# Patient Record
Sex: Female | Born: 1940 | Race: White | Hispanic: No | Smoking: Former smoker
Health system: Southern US, Community
[De-identification: ages and names within clinical notes are randomized; demographics above are authoritative.]

## PROBLEM LIST (undated history)

## (undated) DIAGNOSIS — I509 Heart failure, unspecified: Secondary | ICD-10-CM

## (undated) DIAGNOSIS — I739 Peripheral vascular disease, unspecified: Secondary | ICD-10-CM

## (undated) DIAGNOSIS — I10 Essential (primary) hypertension: Secondary | ICD-10-CM

## (undated) DIAGNOSIS — I219 Acute myocardial infarction, unspecified: Secondary | ICD-10-CM

## (undated) DIAGNOSIS — E785 Hyperlipidemia, unspecified: Secondary | ICD-10-CM

## (undated) DIAGNOSIS — R0600 Dyspnea, unspecified: Secondary | ICD-10-CM

## (undated) DIAGNOSIS — Z923 Personal history of irradiation: Secondary | ICD-10-CM

## (undated) DIAGNOSIS — I2510 Atherosclerotic heart disease of native coronary artery without angina pectoris: Secondary | ICD-10-CM

## (undated) DIAGNOSIS — M1990 Unspecified osteoarthritis, unspecified site: Secondary | ICD-10-CM

## (undated) DIAGNOSIS — C50919 Malignant neoplasm of unspecified site of unspecified female breast: Secondary | ICD-10-CM

## (undated) HISTORY — PX: OTHER SURGICAL HISTORY: SHX169

## (undated) HISTORY — PX: CHOLECYSTECTOMY: SHX55

## (undated) HISTORY — PX: DILATION AND CURETTAGE OF UTERUS: SHX78

## (undated) HISTORY — PX: CATARACT EXTRACTION W/ INTRAOCULAR LENS IMPLANT & ANTERIOR VITRECTOMY, BILATERAL: SHX1310

## (undated) HISTORY — DX: Heart failure, unspecified: I50.9

## (undated) HISTORY — DX: Hyperlipidemia, unspecified: E78.5

## (undated) HISTORY — PX: APPENDECTOMY: SHX54

## (undated) HISTORY — PX: BREAST LUMPECTOMY: SHX2

## (undated) HISTORY — PX: EYE SURGERY: SHX253

## (undated) HISTORY — PX: ABDOMINAL HYSTERECTOMY: SHX81

---

## 2004-10-10 ENCOUNTER — Ambulatory Visit: Payer: Self-pay | Admitting: Internal Medicine

## 2005-05-14 ENCOUNTER — Ambulatory Visit: Payer: Self-pay | Admitting: Internal Medicine

## 2005-06-14 ENCOUNTER — Ambulatory Visit: Payer: Self-pay | Admitting: Internal Medicine

## 2005-10-11 ENCOUNTER — Ambulatory Visit: Payer: Self-pay | Admitting: Internal Medicine

## 2006-10-13 ENCOUNTER — Ambulatory Visit: Payer: Self-pay | Admitting: Internal Medicine

## 2006-12-01 ENCOUNTER — Ambulatory Visit: Payer: Self-pay | Admitting: Gastroenterology

## 2007-11-24 ENCOUNTER — Ambulatory Visit: Payer: Self-pay | Admitting: Internal Medicine

## 2008-11-24 ENCOUNTER — Ambulatory Visit: Payer: Self-pay | Admitting: Internal Medicine

## 2009-01-14 DIAGNOSIS — C50919 Malignant neoplasm of unspecified site of unspecified female breast: Secondary | ICD-10-CM

## 2009-01-14 HISTORY — DX: Malignant neoplasm of unspecified site of unspecified female breast: C50.919

## 2009-11-27 ENCOUNTER — Ambulatory Visit: Payer: Self-pay | Admitting: Internal Medicine

## 2009-12-05 ENCOUNTER — Ambulatory Visit: Payer: Self-pay | Admitting: Internal Medicine

## 2009-12-21 ENCOUNTER — Ambulatory Visit: Payer: Self-pay | Admitting: Surgery

## 2009-12-21 HISTORY — PX: BREAST BIOPSY: SHX20

## 2009-12-29 LAB — PATHOLOGY REPORT

## 2010-01-11 ENCOUNTER — Ambulatory Visit: Payer: Self-pay | Admitting: Surgery

## 2010-01-17 ENCOUNTER — Ambulatory Visit: Payer: Self-pay | Admitting: Surgery

## 2010-01-17 HISTORY — PX: BREAST EXCISIONAL BIOPSY: SUR124

## 2010-01-24 ENCOUNTER — Ambulatory Visit: Payer: Self-pay | Admitting: Oncology

## 2010-01-24 LAB — PATHOLOGY REPORT

## 2010-02-14 ENCOUNTER — Ambulatory Visit: Payer: Self-pay | Admitting: Oncology

## 2010-03-15 ENCOUNTER — Ambulatory Visit: Payer: Self-pay | Admitting: Oncology

## 2010-04-15 ENCOUNTER — Ambulatory Visit: Payer: Self-pay | Admitting: Oncology

## 2010-05-15 ENCOUNTER — Ambulatory Visit: Payer: Self-pay | Admitting: Oncology

## 2010-06-15 ENCOUNTER — Ambulatory Visit: Payer: Self-pay | Admitting: Oncology

## 2010-07-23 ENCOUNTER — Ambulatory Visit: Payer: Self-pay | Admitting: Oncology

## 2010-08-15 ENCOUNTER — Ambulatory Visit: Payer: Self-pay | Admitting: Oncology

## 2010-10-24 ENCOUNTER — Ambulatory Visit: Payer: Self-pay | Admitting: Oncology

## 2010-11-15 ENCOUNTER — Ambulatory Visit: Payer: Self-pay | Admitting: Oncology

## 2011-01-15 ENCOUNTER — Ambulatory Visit: Payer: Self-pay | Admitting: Oncology

## 2011-01-28 ENCOUNTER — Ambulatory Visit: Payer: Self-pay | Admitting: Oncology

## 2011-01-30 ENCOUNTER — Ambulatory Visit: Payer: Self-pay | Admitting: Oncology

## 2011-01-31 ENCOUNTER — Ambulatory Visit: Payer: Self-pay | Admitting: Oncology

## 2011-02-15 ENCOUNTER — Ambulatory Visit: Payer: Self-pay | Admitting: Oncology

## 2011-07-31 ENCOUNTER — Ambulatory Visit: Payer: Self-pay | Admitting: Oncology

## 2011-08-01 ENCOUNTER — Ambulatory Visit: Payer: Self-pay | Admitting: Oncology

## 2011-08-15 ENCOUNTER — Ambulatory Visit: Payer: Self-pay | Admitting: Oncology

## 2011-10-24 ENCOUNTER — Ambulatory Visit: Payer: Self-pay | Admitting: Oncology

## 2011-11-07 LAB — CBC CANCER CENTER
Basophil %: 0.6 %
Eosinophil #: 0.1 x10 3/mm (ref 0.0–0.7)
Eosinophil %: 3.4 %
HCT: 35.4 % (ref 35.0–47.0)
HGB: 12 g/dL (ref 12.0–16.0)
MCH: 36.6 pg — ABNORMAL HIGH (ref 26.0–34.0)
MCHC: 33.9 g/dL (ref 32.0–36.0)
MCV: 108 fL — ABNORMAL HIGH (ref 80–100)
Monocyte #: 0.3 x10 3/mm (ref 0.2–0.9)
Neutrophil #: 1.7 x10 3/mm (ref 1.4–6.5)
Neutrophil %: 43.6 %
RBC: 3.28 10*6/uL — ABNORMAL LOW (ref 3.80–5.20)

## 2011-11-15 ENCOUNTER — Ambulatory Visit: Payer: Self-pay | Admitting: Oncology

## 2012-01-15 ENCOUNTER — Ambulatory Visit: Payer: Self-pay | Admitting: Oncology

## 2012-02-03 ENCOUNTER — Ambulatory Visit: Payer: Self-pay | Admitting: Oncology

## 2012-02-06 LAB — CBC CANCER CENTER
Basophil #: 0 x10 3/mm (ref 0.0–0.1)
Eosinophil #: 0.1 x10 3/mm (ref 0.0–0.7)
Eosinophil %: 3.1 %
HCT: 33 % — ABNORMAL LOW (ref 35.0–47.0)
HGB: 11.9 g/dL — ABNORMAL LOW (ref 12.0–16.0)
MCH: 37.4 pg — ABNORMAL HIGH (ref 26.0–34.0)
MCHC: 35.9 g/dL (ref 32.0–36.0)
Neutrophil #: 2.2 x10 3/mm (ref 1.4–6.5)
Platelet: 96 x10 3/mm — ABNORMAL LOW (ref 150–440)
RBC: 3.17 10*6/uL — ABNORMAL LOW (ref 3.80–5.20)
WBC: 4.8 x10 3/mm (ref 3.6–11.0)

## 2012-02-15 ENCOUNTER — Ambulatory Visit: Payer: Self-pay | Admitting: Oncology

## 2012-04-14 ENCOUNTER — Ambulatory Visit: Payer: Self-pay | Admitting: Oncology

## 2012-05-07 LAB — CBC CANCER CENTER
Basophil #: 0 x10 3/mm (ref 0.0–0.1)
Basophil %: 0.7 %
HGB: 12 g/dL (ref 12.0–16.0)
Lymphocyte #: 2.2 x10 3/mm (ref 1.0–3.6)
Lymphocyte %: 41.2 %
MCH: 37.3 pg — ABNORMAL HIGH (ref 26.0–34.0)
MCV: 105 fL — ABNORMAL HIGH (ref 80–100)
Monocyte #: 0.7 x10 3/mm (ref 0.2–0.9)
Monocyte %: 12.8 %
Neutrophil #: 2.2 x10 3/mm (ref 1.4–6.5)
Neutrophil %: 42.5 %
Platelet: 110 x10 3/mm — ABNORMAL LOW (ref 150–440)
RBC: 3.22 10*6/uL — ABNORMAL LOW (ref 3.80–5.20)
RDW: 13 % (ref 11.5–14.5)
WBC: 5.2 x10 3/mm (ref 3.6–11.0)

## 2012-05-14 ENCOUNTER — Ambulatory Visit: Payer: Self-pay | Admitting: Oncology

## 2012-08-04 ENCOUNTER — Ambulatory Visit: Payer: Self-pay | Admitting: Oncology

## 2012-08-19 ENCOUNTER — Ambulatory Visit: Payer: Self-pay | Admitting: Oncology

## 2012-09-17 ENCOUNTER — Ambulatory Visit: Payer: Self-pay | Admitting: Oncology

## 2012-09-17 LAB — CBC CANCER CENTER
Basophil %: 0.5 %
HCT: 34.8 % — ABNORMAL LOW (ref 35.0–47.0)
Lymphocyte #: 1.6 x10 3/mm (ref 1.0–3.6)
MCH: 36.9 pg — ABNORMAL HIGH (ref 26.0–34.0)
MCHC: 35.2 g/dL (ref 32.0–36.0)
MCV: 105 fL — ABNORMAL HIGH (ref 80–100)
Monocyte #: 0.4 x10 3/mm (ref 0.2–0.9)
Neutrophil %: 40.8 %
RBC: 3.32 10*6/uL — ABNORMAL LOW (ref 3.80–5.20)
RDW: 13.1 % (ref 11.5–14.5)

## 2012-09-22 ENCOUNTER — Ambulatory Visit: Payer: Self-pay | Admitting: Surgery

## 2012-10-14 ENCOUNTER — Ambulatory Visit: Payer: Self-pay | Admitting: Oncology

## 2012-11-14 ENCOUNTER — Ambulatory Visit: Payer: Self-pay | Admitting: Oncology

## 2013-03-18 ENCOUNTER — Ambulatory Visit: Payer: Self-pay | Admitting: Oncology

## 2013-03-18 LAB — CBC CANCER CENTER
BASOS ABS: 0 x10 3/mm (ref 0.0–0.1)
BASOS PCT: 0.7 %
Eosinophil #: 0.1 x10 3/mm (ref 0.0–0.7)
Eosinophil %: 3 %
HCT: 35.3 % (ref 35.0–47.0)
HGB: 12 g/dL (ref 12.0–16.0)
Lymphocyte #: 1.7 x10 3/mm (ref 1.0–3.6)
Lymphocyte %: 40.9 %
MCH: 35.4 pg — ABNORMAL HIGH (ref 26.0–34.0)
MCHC: 34.1 g/dL (ref 32.0–36.0)
MCV: 104 fL — ABNORMAL HIGH (ref 80–100)
MONOS PCT: 11.5 %
Monocyte #: 0.5 x10 3/mm (ref 0.2–0.9)
NEUTROS PCT: 43.9 %
Neutrophil #: 1.8 x10 3/mm (ref 1.4–6.5)
PLATELETS: 97 x10 3/mm — AB (ref 150–440)
RBC: 3.4 10*6/uL — ABNORMAL LOW (ref 3.80–5.20)
RDW: 13 % (ref 11.5–14.5)
WBC: 4.1 x10 3/mm (ref 3.6–11.0)

## 2013-04-14 ENCOUNTER — Ambulatory Visit: Payer: Self-pay | Admitting: Oncology

## 2013-09-23 ENCOUNTER — Ambulatory Visit: Payer: Self-pay | Admitting: Oncology

## 2013-09-30 ENCOUNTER — Ambulatory Visit: Payer: Self-pay | Admitting: Oncology

## 2013-10-01 LAB — CBC CANCER CENTER
BASOS ABS: 0 x10 3/mm (ref 0.0–0.1)
Basophil %: 0.4 %
EOS ABS: 0 x10 3/mm (ref 0.0–0.7)
Eosinophil %: 0.4 %
HCT: 36.8 % (ref 35.0–47.0)
HGB: 12.7 g/dL (ref 12.0–16.0)
LYMPHS ABS: 2.8 x10 3/mm (ref 1.0–3.6)
Lymphocyte %: 34.2 %
MCH: 36.7 pg — AB (ref 26.0–34.0)
MCHC: 34.5 g/dL (ref 32.0–36.0)
MCV: 106 fL — ABNORMAL HIGH (ref 80–100)
MONO ABS: 0.6 x10 3/mm (ref 0.2–0.9)
MONOS PCT: 7.7 %
NEUTROS ABS: 4.8 x10 3/mm (ref 1.4–6.5)
NEUTROS PCT: 57.3 %
Platelet: 142 x10 3/mm — ABNORMAL LOW (ref 150–440)
RBC: 3.46 10*6/uL — AB (ref 3.80–5.20)
RDW: 13.3 % (ref 11.5–14.5)
WBC: 8.3 x10 3/mm (ref 3.6–11.0)

## 2013-10-14 ENCOUNTER — Ambulatory Visit: Payer: Self-pay | Admitting: Oncology

## 2013-10-27 ENCOUNTER — Ambulatory Visit: Payer: Self-pay | Admitting: Oncology

## 2013-11-07 ENCOUNTER — Emergency Department: Payer: Self-pay | Admitting: Emergency Medicine

## 2013-11-14 ENCOUNTER — Ambulatory Visit: Payer: Self-pay | Admitting: Oncology

## 2013-12-03 ENCOUNTER — Emergency Department: Payer: Self-pay | Admitting: Emergency Medicine

## 2014-04-11 ENCOUNTER — Ambulatory Visit
Admit: 2014-04-11 | Disposition: A | Discharge status: Home / Self Care | Payer: Self-pay | Attending: Oncology | Admitting: Oncology

## 2014-04-11 LAB — CBC CANCER CENTER
BASOS ABS: 0 x10 3/mm (ref 0.0–0.1)
Basophil %: 0.5 %
EOS ABS: 0.1 x10 3/mm (ref 0.0–0.7)
Eosinophil %: 2.5 %
HCT: 34 % — ABNORMAL LOW (ref 35.0–47.0)
HGB: 11.9 g/dL — ABNORMAL LOW (ref 12.0–16.0)
LYMPHS PCT: 36.1 %
Lymphocyte #: 1.6 x10 3/mm (ref 1.0–3.6)
MCH: 35.7 pg — ABNORMAL HIGH (ref 26.0–34.0)
MCHC: 34.9 g/dL (ref 32.0–36.0)
MCV: 102 fL — ABNORMAL HIGH (ref 80–100)
Monocyte #: 0.5 x10 3/mm (ref 0.2–0.9)
Monocyte %: 12 %
Neutrophil #: 2.2 x10 3/mm (ref 1.4–6.5)
Neutrophil %: 48.9 %
PLATELETS: 98 x10 3/mm — AB (ref 150–440)
RBC: 3.32 10*6/uL — ABNORMAL LOW (ref 3.80–5.20)
RDW: 12.5 % (ref 11.5–14.5)
WBC: 4.5 x10 3/mm (ref 3.6–11.0)

## 2014-04-15 ENCOUNTER — Ambulatory Visit
Admit: 2014-04-15 | Disposition: A | Discharge status: Home / Self Care | Payer: Self-pay | Attending: Oncology | Admitting: Oncology

## 2014-04-29 ENCOUNTER — Other Ambulatory Visit: Payer: Self-pay | Admitting: Oncology

## 2014-04-29 DIAGNOSIS — Z853 Personal history of malignant neoplasm of breast: Secondary | ICD-10-CM

## 2014-09-26 ENCOUNTER — Other Ambulatory Visit: Payer: Self-pay

## 2014-10-05 ENCOUNTER — Other Ambulatory Visit: Payer: Self-pay | Admitting: Oncology

## 2014-10-05 ENCOUNTER — Inpatient Hospital Stay: Payer: Medicare Other

## 2014-10-05 ENCOUNTER — Inpatient Hospital Stay: Payer: Medicare Other | Admitting: Oncology

## 2014-10-05 DIAGNOSIS — D0510 Intraductal carcinoma in situ of unspecified breast: Secondary | ICD-10-CM

## 2014-10-13 ENCOUNTER — Other Ambulatory Visit: Payer: Self-pay

## 2014-10-13 ENCOUNTER — Ambulatory Visit: Payer: Self-pay | Admitting: Oncology

## 2014-10-24 ENCOUNTER — Inpatient Hospital Stay: Payer: Medicare Other

## 2014-10-24 ENCOUNTER — Inpatient Hospital Stay: Payer: Medicare Other | Admitting: Oncology

## 2014-11-02 ENCOUNTER — Encounter: Payer: Self-pay | Admitting: Radiation Oncology

## 2014-11-02 ENCOUNTER — Other Ambulatory Visit: Payer: Self-pay | Admitting: Oncology

## 2014-11-02 ENCOUNTER — Ambulatory Visit
Admission: RE | Admit: 2014-11-02 | Discharge: 2014-11-02 | Disposition: A | Discharge status: Home / Self Care | Payer: Medicare Other | Source: Ambulatory Visit | Attending: Radiation Oncology | Admitting: Radiation Oncology

## 2014-11-02 VITALS — BP 168/72 | HR 69 | Temp 97.5°F | Resp 18 | Ht 60.0 in | Wt 149.5 lb

## 2014-11-02 DIAGNOSIS — C50912 Malignant neoplasm of unspecified site of left female breast: Secondary | ICD-10-CM

## 2014-11-02 DIAGNOSIS — D0510 Intraductal carcinoma in situ of unspecified breast: Secondary | ICD-10-CM

## 2014-11-02 HISTORY — DX: Malignant neoplasm of unspecified site of unspecified female breast: C50.919

## 2014-11-02 HISTORY — DX: Essential (primary) hypertension: I10

## 2014-11-02 NOTE — Progress Notes (Signed)
Radiation Oncology Follow up Note  Name: [name]   Date:   11/02/2014 MRN:  [id number] DOB: July 08, 1940    This 74 y.o. 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  REFERRING PROVIDER: No ref. 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 She is seen today in routine follow-up is doing well. She specifically denies breast tenderness cough or bone pain. Currently on tamoxifen tolerating that well without side effect. She missed her most recent mammogram that has been rescheduled..  COMPLICATIONS OF TREATMENT: none  FOLLOW UP COMPLIANCE: keeps appointments   PHYSICAL EXAM:  BP 168/72 mmHg  Pulse 69  Temp(Src) 97.5 F (36.4 C)  Resp 18  Ht 5' (1.524 m)  Wt 149 lb 7.6 oz (67.8 kg)  BMI 29.19 kg/m2 Lungs are clear to A&P cardiac examination essentially unremarkable with regular rate and rhythm. No dominant mass or nodularity is noted in either breast in 2 positions examined. Incision is well-healed. No axillary or supraclavicular adenopathy is appreciated. Cosmetic result is excellent. Well-developed well-nourished patient in NAD. HEENT reveals PERLA, EOMI, discs not visualized.  Oral cavity is clear. No oral mucosal lesions are identified. Neck is clear without evidence of cervical or supraclavicular adenopathy. Lungs are clear to A&P. Cardiac examination is essentially unremarkable with regular rate and rhythm without murmur rub or thrill. Abdomen is benign with no organomegaly or masses noted. Motor sensory and DTR levels are equal and symmetric in the upper and lower extremities. Cranial nerves II through XII are grossly intact. Proprioception is intact. No peripheral adenopathy or edema is identified. No motor or sensory levels are noted. Crude visual fields are  within normal range.  RADIOLOGY RESULTS: Most recent mammograms are reviewed  PLAN: At the present time she is now for half years out radiation therapy for ductal carcinoma in situ doing well with no evidence of disease. I'm please were overall progress. She continues on tamoxifen without side effect. I've made sure she has follow-up mammograms rescheduled and knows will be performed. I've asked to see her back in 1 year for follow-up and then will discontinue follow-up care. Patient knows to call sooner with any concerns.  I would like to take this opportunity for allowing me to participate in the care of your patient.Armstead Peaks., MD

## 2014-11-03 ENCOUNTER — Inpatient Hospital Stay: Payer: Medicare Other | Admitting: Oncology

## 2014-11-03 ENCOUNTER — Inpatient Hospital Stay: Payer: Medicare Other

## 2014-11-21 ENCOUNTER — Ambulatory Visit
Admission: RE | Admit: 2014-11-21 | Discharge: 2014-11-21 | Disposition: A | Discharge status: Home / Self Care | Payer: Medicare Other | Source: Ambulatory Visit | Attending: Oncology | Admitting: Oncology

## 2014-11-21 ENCOUNTER — Other Ambulatory Visit: Payer: Self-pay | Admitting: Oncology

## 2014-11-21 DIAGNOSIS — Z853 Personal history of malignant neoplasm of breast: Secondary | ICD-10-CM

## 2014-11-21 DIAGNOSIS — D0510 Intraductal carcinoma in situ of unspecified breast: Secondary | ICD-10-CM

## 2014-11-22 ENCOUNTER — Inpatient Hospital Stay: Payer: Medicare Other | Attending: Oncology

## 2014-11-22 ENCOUNTER — Inpatient Hospital Stay (HOSPITAL_BASED_OUTPATIENT_CLINIC_OR_DEPARTMENT_OTHER): Payer: Medicare Other | Admitting: Oncology

## 2014-11-22 VITALS — BP 148/76 | HR 74 | Temp 96.3°F | Resp 16 | Wt 151.2 lb

## 2014-11-22 DIAGNOSIS — Z79810 Long term (current) use of selective estrogen receptor modulators (SERMs): Secondary | ICD-10-CM | POA: Diagnosis not present

## 2014-11-22 DIAGNOSIS — Z79899 Other long term (current) drug therapy: Secondary | ICD-10-CM | POA: Insufficient documentation

## 2014-11-22 DIAGNOSIS — I10 Essential (primary) hypertension: Secondary | ICD-10-CM | POA: Insufficient documentation

## 2014-11-22 DIAGNOSIS — Z7982 Long term (current) use of aspirin: Secondary | ICD-10-CM

## 2014-11-22 DIAGNOSIS — Z170 Estrogen receptor positive status [ER+]: Secondary | ICD-10-CM | POA: Diagnosis not present

## 2014-11-22 DIAGNOSIS — D696 Thrombocytopenia, unspecified: Secondary | ICD-10-CM

## 2014-11-22 DIAGNOSIS — Z87891 Personal history of nicotine dependence: Secondary | ICD-10-CM | POA: Diagnosis not present

## 2014-11-22 DIAGNOSIS — Z853 Personal history of malignant neoplasm of breast: Secondary | ICD-10-CM | POA: Insufficient documentation

## 2014-11-22 DIAGNOSIS — D0512 Intraductal carcinoma in situ of left breast: Secondary | ICD-10-CM

## 2014-11-22 DIAGNOSIS — Z7984 Long term (current) use of oral hypoglycemic drugs: Secondary | ICD-10-CM | POA: Diagnosis not present

## 2014-11-22 DIAGNOSIS — E785 Hyperlipidemia, unspecified: Secondary | ICD-10-CM | POA: Insufficient documentation

## 2014-11-22 DIAGNOSIS — I739 Peripheral vascular disease, unspecified: Secondary | ICD-10-CM | POA: Insufficient documentation

## 2014-11-22 DIAGNOSIS — D0510 Intraductal carcinoma in situ of unspecified breast: Secondary | ICD-10-CM

## 2014-11-22 LAB — CBC WITH DIFFERENTIAL/PLATELET
BASOS PCT: 1 %
Basophils Absolute: 0 10*3/uL (ref 0–0.1)
Eosinophils Absolute: 0.1 10*3/uL (ref 0–0.7)
Eosinophils Relative: 2 %
HEMATOCRIT: 37.4 % (ref 35.0–47.0)
Hemoglobin: 13.1 g/dL (ref 12.0–16.0)
Lymphocytes Relative: 39 %
Lymphs Abs: 1.7 10*3/uL (ref 1.0–3.6)
MCH: 35.5 pg — ABNORMAL HIGH (ref 26.0–34.0)
MCHC: 34.9 g/dL (ref 32.0–36.0)
MCV: 101.7 fL — ABNORMAL HIGH (ref 80.0–100.0)
MONO ABS: 0.4 10*3/uL (ref 0.2–0.9)
Monocytes Relative: 8 %
NEUTROS ABS: 2.2 10*3/uL (ref 1.4–6.5)
NEUTROS PCT: 50 %
Platelets: 110 10*3/uL — ABNORMAL LOW (ref 150–440)
RBC: 3.68 MIL/uL — ABNORMAL LOW (ref 3.80–5.20)
RDW: 12.8 % (ref 11.5–14.5)
WBC: 4.3 10*3/uL (ref 3.6–11.0)

## 2014-12-04 NOTE — Progress Notes (Signed)
[facility]  Telephone:([phone]) [phone] Fax:(336) [phone]  ID: [name] OB: 07/14/1940  MR#: [id number]  JEH#:[id number]  Patient Care Team: No Pcp Per Patient as PCP - General (General Practice)  CHIEF COMPLAINT:  Chief Complaint  Patient presents with  . DCIS    INTERVAL HISTORY: Patient returns to clinic today for routine six-month evaluation.  She continues to feel well and is asymptomatic.  She is tolerating her tamoxifen well and denies any hot flashes, myalgias, or arthralgias. She has no neurologic complaints.  She has no chest pain or shortness of breath.  She has a good appetite and denies weight loss.  She has no nausea, vomiting, constipation, or diarrhea.  She has no bony pain.  Patient offers no specific complaints today.   REVIEW OF SYSTEMS:   Review of Systems  Constitutional: Negative.  Negative for malaise/fatigue.  Respiratory: Negative.   Cardiovascular: Negative.   Gastrointestinal: Negative.   Musculoskeletal: Negative.   Neurological: Negative.  Negative for sensory change and weakness.    As per HPI. Otherwise, a complete review of systems is negatve.  PAST MEDICAL HISTORY: Past Medical History  Diagnosis Date  . Hypertension   . Breast cancer [facility]) 2011    radiation    PAST SURGICAL HISTORY: Past Surgical History  Procedure Laterality Date  . Breast excisional biopsy Left 12/21/2009    positive, radiation    FAMILY HISTORY: Reviewed and unchanged. No reported history of malignancy or chronic disease.     ADVANCED DIRECTIVES:    HEALTH MAINTENANCE: Social History  Substance Use Topics  . Smoking status: Former Research scientist (life sciences)  . Smokeless tobacco: Never Used  . Alcohol Use: Not on file     Colonoscopy:  PAP:  Bone density:  Lipid panel:  Allergies  Allergen Reactions  . Amlodipine     Other reaction(s): Unknown  . Ibuprofen Other (See Comments)  . Other Other (See Comments)    redness and "sensitive"  .  Avapro  [Irbesartan] Rash    Other reaction(s): Weal    Current Outpatient Prescriptions  Medication Sig Dispense Refill  . aliskiren (TEKTURNA) 300 MG tablet TAKE ONE TABLET BY MOUTH ONCE DAILY    . amLODipine (NORVASC) 10 MG tablet TAKE ONE TABLET BY MOUTH ONCE DAILY FOR BLOOD PRESSURE    . aspirin EC 81 MG tablet Take by mouth.    Marland Kitchen atenolol (TENORMIN) 100 MG tablet TAKE ONE TABLET BY MOUTH ONCE DAILY    . BESIVANCE 0.6 % SUSP     . Calcium Carbonate-Vit D-Min (CALTRATE 600+D PLUS PO) Take by mouth.    . DUREZOL 0.05 % EMUL     . fluticasone (FLONASE) 50 MCG/ACT nasal spray Place into the nose.    . Glucos-Chond-Sterol-Fish Oil (GLUCOSAMINE CHONDROITIN PLUS PO) Take by mouth.    . hydrochlorothiazide (HYDRODIURIL) 25 MG tablet TAKE ONE TABLET BY MOUTH ONCE DAILY    . ILEVRO 0.3 % ophthalmic suspension     . ketoconazole (NIZORAL) 2 % cream Apply topically.    . Multiple Vitamin (MULTI-VITAMINS) TABS Take by mouth.    . potassium chloride SA (KLOR-CON M20) 20 MEQ tablet TAKE ONE TABLET BY MOUTH ONCE DAILY    . simvastatin (ZOCOR) 10 MG tablet Take by mouth.    . tamoxifen (NOLVADEX) 20 MG tablet Take by mouth.    . timolol (TIMOPTIC) 0.5 % ophthalmic solution Apply to eye.     No current facility-administered medications for this visit.    OBJECTIVE:  Filed Vitals:   11/22/14 1116  BP: 148/76  Pulse: 74  Temp: 96.3 F (35.7 C)  Resp: 16     Body mass index is 29.54 kg/(m^2).    ECOG FS:0 - Asymptomatic  General: Well-developed, well-nourished, no acute distress. Eyes: Pink conjunctiva, anicteric sclera. Breasts: Bilateral breast and axilla without lumps or masses. Lungs: Clear to auscultation bilaterally. Heart: Regular rate and rhythm. No rubs, murmurs, or gallops. Abdomen: Soft, nontender, nondistended. No organomegaly noted, normoactive bowel sounds. Musculoskeletal: No edema, cyanosis, or clubbing. Neuro: Alert, answering all questions appropriately. Cranial nerves  grossly intact. Skin: No rashes or petechiae noted. Psych: Normal affect.   LAB RESULTS:  No results found for: NA, K, CL, CO2, GLUCOSE, BUN, CREATININE, CALCIUM, PROT, ALBUMIN, AST, ALT, ALKPHOS, BILITOT, GFRNONAA, GFRAA  Lab Results  Component Value Date   WBC 4.3 11/22/2014   NEUTROABS 2.2 11/22/2014   HGB 13.1 11/22/2014   HCT 37.4 11/22/2014   MCV 101.7* 11/22/2014   PLT 110* 11/22/2014     STUDIES: Mm Diag Breast Tomo Bilateral  11/21/2014  CLINICAL DATA:  Patient with history of left breast lumpectomy 2011. EXAM: DIGITAL DIAGNOSTIC BILATERAL MAMMOGRAM WITH 3D TOMOSYNTHESIS AND CAD COMPARISON:  Previous exam(s). ACR Breast Density Category b: There are scattered areas of fibroglandular density. FINDINGS: Stable postlumpectomy changes left breast. No concerning masses, calcifications or architectural distortion identified within either breast. Mammographic images were processed with CAD. IMPRESSION: No mammographic evidence for malignancy. Stable postlumpectomy changes left breast. RECOMMENDATION: Bilateral diagnostic mammography in 1 year. I have discussed the findings and recommendations with the patient. Results were also provided in writing at the conclusion of the visit. If applicable, a reminder letter will be sent to the patient regarding the next appointment. BI-RADS CATEGORY  2: Benign. Electronically Signed   By: Lovey Newcomer M.D.   On: 11/21/2014 14:36    ASSESSMENT: DCIS, status post lumpectomy.  PLAN:    1.  DCIS: Continue tamoxifen, completing in April 2017.  Patient's most recent mammogram on November 21, 2014 was reviewed independently and reported as BI-RADS 2. Repeat screening mammography November 2017.  Return to clinic in 6 months for routine evaluation. At that point, patient will have completed her tamoxifen and be greater than 5 years removed from completing her XRT. She could possibly be discharged from clinic. 2.  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  Patient's platelet count decreased but stable and unchanged.  No intervention needed at this time.   Patient expressed understanding and was in agreement with this plan. She also understands that She can call clinic at any time with any questions, concerns, or complaints.   Lloyd Huger, MD   12/04/2014 8:20 AM

## 2014-12-30 ENCOUNTER — Other Ambulatory Visit: Payer: Self-pay | Admitting: Oncology

## 2015-05-22 ENCOUNTER — Inpatient Hospital Stay: Payer: Medicare Other

## 2015-05-22 ENCOUNTER — Inpatient Hospital Stay: Payer: Medicare Other | Admitting: Oncology

## 2015-06-06 ENCOUNTER — Inpatient Hospital Stay: Payer: Medicare Other | Admitting: Oncology

## 2015-06-06 ENCOUNTER — Inpatient Hospital Stay: Payer: Medicare Other

## 2015-06-20 ENCOUNTER — Inpatient Hospital Stay: Payer: Medicare Other

## 2015-06-20 ENCOUNTER — Inpatient Hospital Stay: Payer: Medicare Other | Admitting: Oncology

## 2015-07-12 ENCOUNTER — Inpatient Hospital Stay: Payer: Medicare Other | Attending: Oncology

## 2015-07-12 ENCOUNTER — Inpatient Hospital Stay (HOSPITAL_BASED_OUTPATIENT_CLINIC_OR_DEPARTMENT_OTHER): Payer: Medicare Other | Admitting: Oncology

## 2015-07-12 VITALS — BP 135/67 | HR 73 | Temp 98.9°F | Resp 18 | Wt 148.5 lb

## 2015-07-12 DIAGNOSIS — D696 Thrombocytopenia, unspecified: Secondary | ICD-10-CM

## 2015-07-12 DIAGNOSIS — Z79899 Other long term (current) drug therapy: Secondary | ICD-10-CM | POA: Insufficient documentation

## 2015-07-12 DIAGNOSIS — Z923 Personal history of irradiation: Secondary | ICD-10-CM

## 2015-07-12 DIAGNOSIS — D0510 Intraductal carcinoma in situ of unspecified breast: Secondary | ICD-10-CM

## 2015-07-12 DIAGNOSIS — Z7982 Long term (current) use of aspirin: Secondary | ICD-10-CM

## 2015-07-12 DIAGNOSIS — I10 Essential (primary) hypertension: Secondary | ICD-10-CM | POA: Insufficient documentation

## 2015-07-12 DIAGNOSIS — Z87891 Personal history of nicotine dependence: Secondary | ICD-10-CM | POA: Insufficient documentation

## 2015-07-12 DIAGNOSIS — Z853 Personal history of malignant neoplasm of breast: Secondary | ICD-10-CM

## 2015-07-12 LAB — CBC WITH DIFFERENTIAL/PLATELET
BASOS ABS: 0 10*3/uL (ref 0–0.1)
BASOS PCT: 1 %
EOS PCT: 2 %
Eosinophils Absolute: 0.1 10*3/uL (ref 0–0.7)
HCT: 37.3 % (ref 35.0–47.0)
Hemoglobin: 13.2 g/dL (ref 12.0–16.0)
Lymphocytes Relative: 38 %
Lymphs Abs: 2.1 10*3/uL (ref 1.0–3.6)
MCH: 35.9 pg — ABNORMAL HIGH (ref 26.0–34.0)
MCHC: 35.3 g/dL (ref 32.0–36.0)
MCV: 101.6 fL — ABNORMAL HIGH (ref 80.0–100.0)
Monocytes Absolute: 0.7 10*3/uL (ref 0.2–0.9)
Monocytes Relative: 12 %
Neutro Abs: 2.6 10*3/uL (ref 1.4–6.5)
Neutrophils Relative %: 47 %
PLATELETS: 126 10*3/uL — AB (ref 150–440)
RBC: 3.68 MIL/uL — AB (ref 3.80–5.20)
RDW: 12.7 % (ref 11.5–14.5)
WBC: 5.5 10*3/uL (ref 3.6–11.0)

## 2015-07-12 NOTE — Progress Notes (Signed)
States is having right knee pain due to arthritis.

## 2015-07-16 NOTE — Progress Notes (Signed)
[place]  Telephone:(336) [phone] Fax:(336) [phone]  ID: [name] OB: 18-Mar-1940  MR#: [id number]  OEU#:[id number]  Patient Care Team: No Pcp Per Patient as PCP - General (General Practice)  CHIEF COMPLAINT: Left breast DCIS Chief Complaint  Patient presents with  . thrombocytopenia    INTERVAL HISTORY: Patient returns to clinic today for routine six-month evaluation.  She Recently completed 5 years of tamoxifen. She is complaining of arthritic pain, but otherwise feels well. She has no neurologic complaints. She denies any recent fevers or illnesses. She has no chest pain or shortness of breath.  She has a good appetite and denies weight loss.  She has no nausea, vomiting, constipation, or diarrhea.  She has no new urinary complaints. Patient offers no further specific complaints today.   REVIEW OF SYSTEMS:   Review of Systems  Constitutional: Negative.  Negative for fever and malaise/fatigue.  Respiratory: Negative.  Negative for shortness of breath.   Cardiovascular: Negative.  Negative for chest pain.  Gastrointestinal: Negative.  Negative for abdominal pain.  Genitourinary: Negative.   Musculoskeletal: Negative.   Neurological: Negative.  Negative for sensory change and weakness.  Endo/Heme/Allergies: Does not bruise/bleed easily.  Psychiatric/Behavioral: Negative.     As per HPI. Otherwise, a complete review of systems is negatve.  PAST MEDICAL HISTORY: Past Medical History  Diagnosis Date  . Hypertension   . Breast cancer [facility]) 2011    radiation    PAST SURGICAL HISTORY: Past Surgical History  Procedure Laterality Date  . Breast excisional biopsy Left 12/21/2009    positive, radiation    FAMILY HISTORY: Reviewed and unchanged. No reported history of malignancy or chronic disease.     ADVANCED DIRECTIVES:    HEALTH MAINTENANCE: Social History  Substance Use Topics  . Smoking status: Former Research scientist (life sciences)  . Smokeless tobacco:  Never Used  . Alcohol Use: Not on file     Colonoscopy:  PAP:  Bone density:  Lipid panel:  Allergies  Allergen Reactions  . Amlodipine     Other reaction(s): Unknown  . Ibuprofen Other (See Comments)  . Other Other (See Comments)    redness and "sensitive"  . Avapro  [Irbesartan] Rash    Other reaction(s): Weal    Current Outpatient Prescriptions  Medication Sig Dispense Refill  . aliskiren (TEKTURNA) 300 MG tablet TAKE ONE TABLET BY MOUTH ONCE DAILY    . amLODipine (NORVASC) 10 MG tablet TAKE ONE TABLET BY MOUTH ONCE DAILY FOR BLOOD PRESSURE    . aspirin EC 81 MG tablet Take by mouth.    Marland Kitchen atenolol (TENORMIN) 100 MG tablet TAKE ONE TABLET BY MOUTH ONCE DAILY    . BESIVANCE 0.6 % SUSP     . Calcium Carbonate-Vit D-Min (CALTRATE 600+D PLUS PO) Take by mouth.    . DUREZOL 0.05 % EMUL     . Glucos-Chond-Sterol-Fish Oil (GLUCOSAMINE CHONDROITIN PLUS PO) Take by mouth.    . hydrochlorothiazide (HYDRODIURIL) 25 MG tablet TAKE ONE TABLET BY MOUTH ONCE DAILY    . ILEVRO 0.3 % ophthalmic suspension     . Multiple Vitamin (MULTI-VITAMINS) TABS Take by mouth.    . potassium chloride SA (KLOR-CON M20) 20 MEQ tablet TAKE ONE TABLET BY MOUTH ONCE DAILY    . simvastatin (ZOCOR) 10 MG tablet Take by mouth.    . timolol (TIMOPTIC) 0.5 % ophthalmic solution Apply to eye.    . fluticasone (FLONASE) 50 MCG/ACT nasal spray Place into the nose.     No  current facility-administered medications for this visit.    OBJECTIVE: Filed Vitals:   07/12/15 1522  BP: 135/67  Pulse: 73  Temp: 98.9 F (37.2 C)  Resp: 18     Body mass index is 29 kg/(m^2).    ECOG FS:0 - Asymptomatic  General: Well-developed, well-nourished, no acute distress. Eyes: Pink conjunctiva, anicteric sclera. Breasts: Bilateral breast and axilla without lumps or masses. Lungs: Clear to auscultation bilaterally. Heart: Regular rate and rhythm. No rubs, murmurs, or gallops. Abdomen: Soft, nontender, nondistended. No  organomegaly noted, normoactive bowel sounds. Musculoskeletal: No edema, cyanosis, or clubbing. Neuro: Alert, answering all questions appropriately. Cranial nerves grossly intact. Skin: No rashes or petechiae noted. Psych: Normal affect.   LAB RESULTS:  No results found for: NA, K, CL, CO2, GLUCOSE, BUN, CREATININE, CALCIUM, PROT, ALBUMIN, AST, ALT, ALKPHOS, BILITOT, GFRNONAA, GFRAA  Lab Results  Component Value Date   WBC 5.5 07/12/2015   NEUTROABS 2.6 07/12/2015   HGB 13.2 07/12/2015   HCT 37.3 07/12/2015   MCV 101.6* 07/12/2015   PLT 126* 07/12/2015     STUDIES: No results found.  ASSESSMENT: DCIS, status post lumpectomy.  PLAN:    1.  DCIS: Patient has now completed 5 years of tamoxifen. Her most recent mammogram on November 21, 2014 was reviewed independently and reported as BI-RADS 2. Repeat screening mammography November 2017.  Return to clinic in 1 year for routine evaluation. Patient could possibly be discharged from clinic at that time with continued yearly mammograms ordered by her primary care physician.  2.  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  Patient's platelet count decreased but stable and unchanged.  No intervention needed at this time.   Patient expressed understanding and was in agreement with this plan. She also understands that She can call clinic at any time with any questions, concerns, or complaints.   Lloyd Huger, MD   07/16/2015 11:53 PM

## 2015-11-01 ENCOUNTER — Ambulatory Visit
Admission: RE | Admit: 2015-11-01 | Discharge: 2015-11-01 | Disposition: A | Discharge status: Home / Self Care | Payer: Medicare Other | Source: Ambulatory Visit | Attending: Radiation Oncology | Admitting: Radiation Oncology

## 2015-11-01 VITALS — BP 156/77 | HR 73 | Temp 98.6°F | Resp 18 | Wt 151.7 lb

## 2015-11-01 DIAGNOSIS — C50912 Malignant neoplasm of unspecified site of left female breast: Secondary | ICD-10-CM

## 2015-11-01 DIAGNOSIS — Z86000 Personal history of in-situ neoplasm of breast: Secondary | ICD-10-CM | POA: Diagnosis present

## 2015-11-01 DIAGNOSIS — Z923 Personal history of irradiation: Secondary | ICD-10-CM | POA: Diagnosis not present

## 2015-11-01 NOTE — Progress Notes (Signed)
Radiation Oncology Follow up Note  Name: [name]   Date:   11/01/2015 MRN:  [id number] DOB: 1940/02/11    This 75 y.o. female presents to the clinic today for 5 year follow-up [facility] ductal carcinoma in situ of the left breast status post whole breast radiation.  REFERRING PROVIDER: No ref. 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 She has completed tamoxifen therapy. She specifically denies breast tenderness cough or bone pain. Follow-up mammograms have been benign. BI-RADS 2. She scheduled for additional mammograms in November.  COMPLICATIONS OF TREATMENT: none  FOLLOW UP COMPLIANCE: keeps appointments   PHYSICAL EXAM:  BP (!) 156/77   Pulse 73   Temp 98.6 F (37 C)   Resp 18   Wt 151 lb 10.8 oz (68.8 kg)   BMI 29.62 kg/m  Lungs are clear to A&P cardiac examination essentially unremarkable with regular rate and rhythm. No dominant mass or nodularity is noted in either breast in 2 positions examined. Incision is well-healed. No axillary or supraclavicular adenopathy is appreciated. Cosmetic result is excellent. Well-developed well-nourished patient in NAD. HEENT reveals PERLA, EOMI, discs not visualized.  Oral cavity is clear. No oral mucosal lesions are identified. Neck is clear without evidence of cervical or supraclavicular adenopathy. Lungs are clear to A&P. Cardiac examination is essentially unremarkable with regular rate and rhythm without murmur rub or thrill. Abdomen is benign with no organomegaly or masses noted. Motor sensory and DTR levels are equal and symmetric in the upper and lower extremities. Cranial nerves II through XII are grossly intact. Proprioception is intact. No peripheral adenopathy or edema is identified. No motor or sensory levels are noted. Crude visual fields are within normal  range.  RADIOLOGY RESULTS: Most recent mammograms are reviewed  PLAN: Present time she is now 5 years out with no evidence of disease. I'm please were overall progress. I am going to discontinue follow-up care. Patient knows to call with any concerns. She continues to have yearly mammograms.  I would like to take this opportunity to thank you for allowing me to participate in the care of your patient.Armstead Peaks., MD

## 2015-11-16 ENCOUNTER — Other Ambulatory Visit: Payer: Self-pay | Admitting: Family Medicine

## 2015-11-16 DIAGNOSIS — R748 Abnormal levels of other serum enzymes: Secondary | ICD-10-CM

## 2015-11-20 ENCOUNTER — Ambulatory Visit
Admission: RE | Admit: 2015-11-20 | Discharge: 2015-11-20 | Disposition: A | Discharge status: Home / Self Care | Payer: Medicare Other | Source: Ambulatory Visit | Attending: Family Medicine | Admitting: Family Medicine

## 2015-11-20 DIAGNOSIS — K760 Fatty (change of) liver, not elsewhere classified: Secondary | ICD-10-CM | POA: Insufficient documentation

## 2015-11-20 DIAGNOSIS — R748 Abnormal levels of other serum enzymes: Secondary | ICD-10-CM | POA: Insufficient documentation

## 2015-11-20 DIAGNOSIS — Z9049 Acquired absence of other specified parts of digestive tract: Secondary | ICD-10-CM | POA: Insufficient documentation

## 2015-11-21 ENCOUNTER — Other Ambulatory Visit: Payer: Self-pay | Admitting: Oncology

## 2015-11-21 DIAGNOSIS — D0510 Intraductal carcinoma in situ of unspecified breast: Secondary | ICD-10-CM

## 2015-11-23 ENCOUNTER — Ambulatory Visit
Admission: RE | Admit: 2015-11-23 | Discharge: 2015-11-23 | Disposition: A | Discharge status: Home / Self Care | Payer: Medicare Other | Source: Ambulatory Visit | Attending: Oncology | Admitting: Oncology

## 2015-11-23 DIAGNOSIS — D0512 Intraductal carcinoma in situ of left breast: Secondary | ICD-10-CM | POA: Insufficient documentation

## 2015-11-23 DIAGNOSIS — D0510 Intraductal carcinoma in situ of unspecified breast: Secondary | ICD-10-CM

## 2015-11-23 HISTORY — DX: Personal history of irradiation: Z92.3

## 2016-01-10 DIAGNOSIS — I10 Essential (primary) hypertension: Secondary | ICD-10-CM | POA: Diagnosis not present

## 2016-01-10 DIAGNOSIS — Z01812 Encounter for preprocedural laboratory examination: Secondary | ICD-10-CM | POA: Diagnosis present

## 2016-01-10 LAB — COMPREHENSIVE METABOLIC PANEL
ALK PHOS: 85 U/L (ref 38–126)
ALT: 31 U/L (ref 14–54)
ANION GAP: 8 (ref 5–15)
AST: 47 U/L — ABNORMAL HIGH (ref 15–41)
Albumin: 3.9 g/dL (ref 3.5–5.0)
BUN: 16 mg/dL (ref 6–20)
CALCIUM: 9.2 mg/dL (ref 8.9–10.3)
CO2: 26 mmol/L (ref 22–32)
Chloride: 102 mmol/L (ref 101–111)
Creatinine, Ser: 0.94 mg/dL (ref 0.44–1.00)
GFR calc non Af Amer: 59 mL/min — ABNORMAL LOW (ref >60)
Glucose, Bld: 157 mg/dL — ABNORMAL HIGH (ref 65–99)
Potassium: 3 mmol/L — ABNORMAL LOW (ref 3.5–5.1)
SODIUM: 136 mmol/L (ref 135–145)
Total Bilirubin: 1.4 mg/dL — ABNORMAL HIGH (ref 0.3–1.2)
Total Protein: 7.4 g/dL (ref 6.5–8.1)

## 2016-01-10 LAB — URINALYSIS, COMPLETE (UACMP) WITH MICROSCOPIC
BACTERIA UA: NONE SEEN
Bilirubin Urine: NEGATIVE
Glucose, UA: NEGATIVE mg/dL
Hgb urine dipstick: NEGATIVE
Ketones, ur: NEGATIVE mg/dL
Leukocytes, UA: NEGATIVE
Nitrite: NEGATIVE
PROTEIN: NEGATIVE mg/dL
SQUAMOUS EPITHELIAL / LPF: NONE SEEN
Specific Gravity, Urine: 1.017 (ref 1.005–1.030)
pH: 6 (ref 5.0–8.0)

## 2016-01-10 LAB — CBC
HCT: 37.9 % (ref 35.0–47.0)
HEMOGLOBIN: 13.2 g/dL (ref 12.0–16.0)
MCH: 35.1 pg — AB (ref 26.0–34.0)
MCHC: 34.8 g/dL (ref 32.0–36.0)
MCV: 101 fL — ABNORMAL HIGH (ref 80.0–100.0)
Platelets: 150 10*3/uL (ref 150–440)
RBC: 3.75 MIL/uL — ABNORMAL LOW (ref 3.80–5.20)
RDW: 12.4 % (ref 11.5–14.5)
WBC: 5.6 10*3/uL (ref 3.6–11.0)

## 2016-01-10 LAB — TYPE AND SCREEN
ABO/RH(D): O POS
Antibody Screen: NEGATIVE

## 2016-01-10 LAB — APTT: aPTT: 28 seconds (ref 24–36)

## 2016-01-10 LAB — PROTIME-INR
INR: 1.02
Prothrombin Time: 13.4 seconds (ref 11.4–15.2)

## 2016-01-10 LAB — SEDIMENTATION RATE: SED RATE: 43 mm/h — AB (ref 0–30)

## 2016-01-10 LAB — C-REACTIVE PROTEIN: CRP: 1.4 mg/dL — ABNORMAL HIGH (ref <1.0)

## 2016-01-10 NOTE — Patient Instructions (Addendum)
  Your procedure is scheduled on: 01/24/16 Wed Report to Same Day Surgery 2nd floor medical mall [facility] Entrance-take elevator on left to 2nd floor.  Check in with surgery information desk.) To find out your arrival time please call [phone] between 1PM - 3PM on 01/23/16 Tues  Remember: Instructions that are not followed completely may result in serious medical risk, up to and including death, or upon the discretion of your surgeon and anesthesiologist your surgery may need to be rescheduled.    _x___ 1. Do not eat food or drink liquids after midnight. No gum chewing or hard candies.     __x__ 2. No Alcohol for 24 hours before or after surgery.   __x__3. No Smoking for 24 prior to surgery.   ____  4. Bring all medications with you on the day of surgery if instructed.    __x__ 5. Notify your doctor if there is any change in your medical condition     (cold, fever, infections).     Do not wear jewelry, make-up, hairpins, clips or nail polish.  Do not wear lotions, powders, or perfumes. You may wear deodorant.  Do not shave 48 hours prior to surgery. Men may shave face and neck.  Do not bring valuables to the hospital.    [facility] is not responsible for any belongings or valuables.               Contacts, dentures or bridgework may not be worn into surgery.  Leave your suitcase in the car. After surgery it may be brought to your room.  For patients admitted to the hospital, discharge time is determined by your treatment team.   Patients discharged the day of surgery will not be allowed to drive home.  You will need someone to drive you home and stay with you the night of your procedure.    Please read over the following fact sheets that you were given:   [facility] Preparing for Surgery and or MRSA Information   _x___ Take these medicines the morning of surgery with A SIP OF WATER:    1. aliskiren (TEKTURNA)  2.amLODipine (NORVASC)   3.atenolol (TENORMIN  4.timolol  (TIMOPTIC  5.  6.  ____Fleets enema or Magnesium Citrate as directed.   _x___ Use CHG Soap or sage wipes as directed on instruction sheet   ____ Use inhalers on the day of surgery and bring to hospital day of surgery  ____ Stop metformin 2 days prior to surgery    ____ Take 1/2 of usual insulin dose the night before surgery and none on the morning of           surgery.   ____ Stop Aspirin, Coumadin, Pllavix ,Eliquis, Effient, or Pradaxa  x__ Stop Anti-inflammatories such as Advil, Aleve, Ibuprofen, Motrin, Naproxen,          Naprosyn, Goodies powders or aspirin products. Ok to take Tylenol.   ____ Stop supplements until after surgery.    ____ Bring C-Pap to the hospital.

## 2016-01-11 ENCOUNTER — Encounter
Admission: RE | Admit: 2016-01-11 | Discharge: 2016-01-11 | Disposition: A | Discharge status: Home / Self Care | Payer: Medicare Other | Source: Ambulatory Visit | Attending: Orthopedic Surgery | Admitting: Orthopedic Surgery

## 2016-01-11 ENCOUNTER — Encounter (INDEPENDENT_AMBULATORY_CARE_PROVIDER_SITE_OTHER): Payer: Self-pay

## 2016-01-11 ENCOUNTER — Ambulatory Visit (INDEPENDENT_AMBULATORY_CARE_PROVIDER_SITE_OTHER): Payer: Self-pay | Admitting: Vascular Surgery

## 2016-01-11 DIAGNOSIS — Z01812 Encounter for preprocedural laboratory examination: Secondary | ICD-10-CM | POA: Diagnosis not present

## 2016-01-11 DIAGNOSIS — I10 Essential (primary) hypertension: Secondary | ICD-10-CM | POA: Insufficient documentation

## 2016-01-11 HISTORY — DX: Peripheral vascular disease, unspecified: I73.9

## 2016-01-11 HISTORY — DX: Unspecified osteoarthritis, unspecified site: M19.90

## 2016-01-11 LAB — SURGICAL PCR SCREEN
MRSA, PCR: NEGATIVE
Staphylococcus aureus: NEGATIVE

## 2016-01-11 LAB — URINE CULTURE
CULTURE: NO GROWTH
SPECIAL REQUESTS: NORMAL

## 2016-01-11 NOTE — Pre-Procedure Instructions (Signed)
No results on MRSA swab, called patient for another specimen.  Coming in today after lunch.

## 2016-01-17 ENCOUNTER — Other Ambulatory Visit: Payer: Medicare Other

## 2016-01-18 ENCOUNTER — Encounter (INDEPENDENT_AMBULATORY_CARE_PROVIDER_SITE_OTHER): Payer: Medicare Other

## 2016-01-18 ENCOUNTER — Ambulatory Visit (INDEPENDENT_AMBULATORY_CARE_PROVIDER_SITE_OTHER): Payer: Self-pay | Admitting: Vascular Surgery

## 2016-01-19 ENCOUNTER — Encounter (INDEPENDENT_AMBULATORY_CARE_PROVIDER_SITE_OTHER): Payer: Medicare Other

## 2016-01-19 ENCOUNTER — Other Ambulatory Visit (INDEPENDENT_AMBULATORY_CARE_PROVIDER_SITE_OTHER): Payer: Self-pay | Admitting: Vascular Surgery

## 2016-01-19 DIAGNOSIS — I70219 Atherosclerosis of native arteries of extremities with intermittent claudication, unspecified extremity: Secondary | ICD-10-CM

## 2016-01-22 ENCOUNTER — Ambulatory Visit (INDEPENDENT_AMBULATORY_CARE_PROVIDER_SITE_OTHER): Payer: Medicare Other | Admitting: Vascular Surgery

## 2016-01-22 VITALS — BP 175/83 | HR 74 | Resp 16 | Ht 60.0 in | Wt 144.0 lb

## 2016-01-22 DIAGNOSIS — I70219 Atherosclerosis of native arteries of extremities with intermittent claudication, unspecified extremity: Secondary | ICD-10-CM

## 2016-01-22 DIAGNOSIS — R52 Pain, unspecified: Secondary | ICD-10-CM | POA: Diagnosis not present

## 2016-01-22 DIAGNOSIS — I739 Peripheral vascular disease, unspecified: Secondary | ICD-10-CM | POA: Diagnosis not present

## 2016-01-22 DIAGNOSIS — M170 Bilateral primary osteoarthritis of knee: Secondary | ICD-10-CM

## 2016-01-22 DIAGNOSIS — I10 Essential (primary) hypertension: Secondary | ICD-10-CM | POA: Diagnosis not present

## 2016-01-22 DIAGNOSIS — M179 Osteoarthritis of knee, unspecified: Secondary | ICD-10-CM | POA: Insufficient documentation

## 2016-01-22 DIAGNOSIS — M1710 Unilateral primary osteoarthritis, unspecified knee: Secondary | ICD-10-CM | POA: Insufficient documentation

## 2016-01-22 NOTE — Progress Notes (Signed)
MRN : [id number]  [name] is a 76 y.o. ([birth year]) female who presents with chief complaint of  Chief Complaint  Patient presents with  . Re-evaluation    Ultrasound follow up done on 01/19/16  .  History of Present Illness: The patient returns to the office for followup and review of the noninvasive studies. There have been no interval changes in lower extremity symptoms. No interval shortening of the patient's claudication distance or development of rest pain symptoms. No new ulcers or wounds have occurred since the last visit.  There have been no significant changes to the patient's overall health care.  The patient denies amaurosis fugax or recent TIA symptoms. There are no recent neurological changes noted. The patient denies history of DVT, PE or superficial thrombophlebitis. The patient denies recent episodes of angina or shortness of breath.  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  Duplex ultrasound of the right lower extremity shows moderate disease in the common femoral artery, no other hemodynamically significant stenosis identified ABI's Rt=0.97 and Lt=1.01 (previous ABI's Rt=1.04 and Lt=1.01)  Current Meds  Medication Sig  . acetaminophen (TYLENOL) 500 MG tablet Take 1,000 mg by mouth every 6 (six) hours as needed for mild pain.  Marland Kitchen aliskiren (TEKTURNA) 300 MG tablet TAKE ONE TABLET BY MOUTH ONCE DAILY  . amLODipine (NORVASC) 10 MG tablet TAKE ONE TABLET BY MOUTH ONCE DAILY FOR BLOOD PRESSURE  . atenolol (TENORMIN) 100 MG tablet TAKE ONE TABLET BY MOUTH ONCE DAILY  . Calcium Carbonate-Vit D-Min (CALTRATE 600+D PLUS PO) Take 1 tablet by mouth daily.   Marland Kitchen GLUCOSAMINE-CHONDROITIN PO Take 2 tablets by mouth daily.  . hydrochlorothiazide (HYDRODIURIL) 25 MG tablet TAKE ONE TABLET BY MOUTH ONCE DAILY  . Multiple Vitamin (MULTI-VITAMINS) TABS Take 1 tablet by mouth daily.   . potassium chloride SA (KLOR-CON M20)  20 MEQ tablet TAKE ONE TABLET BY MOUTH ONCE DAILY  . simvastatin (ZOCOR) 10 MG tablet Take 10 mg by mouth daily at 6 PM.   . timolol (TIMOPTIC) 0.5 % ophthalmic solution Place 1 drop into both eyes daily.     Past Medical History:  Diagnosis Date  . Arthritis   . Breast cancer ([place]) 2011   radiation- Left  . Hypertension   . Personal history of radiation therapy   . PVD (peripheral vascular disease) ([place])     Past Surgical History:  Procedure Laterality Date  . ABDOMINAL HYSTERECTOMY    . BREAST EXCISIONAL BIOPSY Left 12/21/2009   positive, radiation  . CATARACT EXTRACTION W/ INTRAOCULAR LENS IMPLANT & ANTERIOR VITRECTOMY, BILATERAL    . CHOLECYSTECTOMY    . stent in Lt leg Left     Social History Social History  Substance Use Topics  . Smoking status: Former Research scientist (life sciences)  . Smokeless tobacco: Never Used  . Alcohol use Not on file    Family History No family history on file. No family history of bleeding/clotting disorders, porphyria or autoimmune disease   Allergies  Allergen Reactions  . Aleve [Naproxen Sodium] Hives  . Ibuprofen Hives  . Other Other (See Comments)    Nickel"ears ran fluid and itching"  . Avapro  [Irbesartan] Rash    Other reaction(s): Weal     REVIEW OF SYSTEMS (Negative unless checked)  Constitutional: '[]'$ Weight loss  '[]'$ Fever  '[]'$ Chills Cardiac: '[]'$ Chest pain   '[]'$ Chest pressure   '[]'$ Palpitations   '[]'$ Shortness of breath when laying flat   '[]'$ Shortness of breath with  exertion. Vascular:  '[x]'$ Pain in legs with walking   '[]'$ Pain in legs at rest  '[]'$ History of DVT   '[]'$ Phlebitis   '[x]'$ Swelling in legs   '[]'$ Varicose veins   '[]'$ Non-healing ulcers Pulmonary:   '[]'$ Uses home oxygen   '[]'$ Productive cough   '[]'$ Hemoptysis   '[]'$ Wheeze  '[]'$ COPD   '[]'$ Asthma Neurologic:  '[]'$ Dizziness   '[]'$ Seizures   '[]'$ History of stroke   '[]'$ History of TIA  '[]'$ Aphasia   '[]'$ Vissual changes   '[]'$ Weakness or numbness in arm   '[]'$ Weakness or numbness in leg Musculoskeletal:   '[]'$ Joint swelling   '[]'$ Joint pain    '[]'$ Low back pain Hematologic:  '[]'$ Easy bruising  '[]'$ Easy bleeding   '[]'$ Hypercoagulable state   '[]'$ Anemic Gastrointestinal:  '[]'$ Diarrhea   '[]'$ Vomiting  '[]'$ Gastroesophageal reflux/heartburn   '[]'$ Difficulty swallowing. Genitourinary:  '[]'$ Chronic kidney disease   '[]'$ Difficult urination  '[]'$ Frequent urination   '[]'$ Blood in urine Skin:  '[]'$ Rashes   '[]'$ Ulcers  Psychological:  '[]'$ History of anxiety   '[]'$  History of major depression.  Physical Examination  Vitals:   01/22/16 1400  BP: (!) 175/83  Pulse: 74  Resp: 16  Weight: 144 lb (65.3 kg)  Height: 5' (1.524 m)   Body mass index is 28.12 kg/m. Gen: WD/WN, NAD Head: [place]/AT, No temporalis wasting.  Ear/Nose/Throat: Hearing grossly intact, nares w/o erythema or drainage, poor dentition Eyes: PER, EOMI, sclera nonicteric.  Neck: Supple, no masses.  No bruit or JVD.  Pulmonary:  Good air movement, clear to auscultation bilaterally, no use of accessory muscles.  Cardiac: RRR, normal S1, S2, no Murmurs. Vascular:  Feet pink and warm bilaterally 2-3 second cap refill no ulcers Vessel Right Left  Radial Palpable Palpable  Ulnar Palpable Palpable  Brachial Palpable Palpable  Carotid Palpable Palpable  Femoral Palpable Palpable  Popliteal Palpable Palpable  PT Trace Palpable Trace Palpable  DP 1+ Palpable 1+ Palpable  Gastrointestinal: soft, non-distended. No guarding/no peritoneal signs.  Musculoskeletal: M/S 5/5 throughout.  No deformity or atrophy.  Neurologic: CN 2-12 intact. Pain and light touch intact in extremities.  Symmetrical.  Speech is fluent. Motor exam as listed above. Psychiatric: Judgment intact, Mood & affect appropriate for pt's clinical situation. Dermatologic: No rashes or ulcers noted.  No changes consistent with cellulitis. Lymph : No Cervical lymphadenopathy, no lichenification or skin changes of chronic lymphedema.  CBC Lab Results  Component Value Date   WBC 5.6 01/10/2016   HGB 13.2 01/10/2016   HCT 37.9 01/10/2016   MCV 101.0  (H) 01/10/2016   PLT 150 01/10/2016    BMET    Component Value Date/Time   NA 136 01/10/2016 1430   K 3.0 (L) 01/10/2016 1430   CL 102 01/10/2016 1430   CO2 26 01/10/2016 1430   GLUCOSE 157 (H) 01/10/2016 1430   BUN 16 01/10/2016 1430   CREATININE 0.94 01/10/2016 1430   CALCIUM 9.2 01/10/2016 1430   GFRNONAA 59 (L) 01/10/2016 1430   GFRAA >60 01/10/2016 1430   Estimated Creatinine Clearance: 44.9 mL/min (by C-G formula based on SCr of 0.94 mg/dL).  COAG Lab Results  Component Value Date   INR 1.02 01/10/2016    Radiology No results found.  Assessment/Plan 1. Peripheral vascular disease ([place])  Recommend:  The patient has evidence of atherosclerosis of the lower extremities with claudication.  The patient does not voice lifestyle limiting changes at this point in time.  Noninvasive studies do not suggest clinically significant change.  No invasive studies, angiography or surgery at this time The patient should continue walking and  begin a more formal exercise program.  The patient should continue antiplatelet therapy and aggressive treatment of the lipid abnormalities  No changes in the patient's medications at this time  The patient should continue wearing graduated compression socks 10-15 mmHg strength to control the mild edema.   - VAS [place] ABI WITH/WO TBI; Future  2. Pain See #1  3. Primary osteoarthritis of both knees Continue NSAID therapy  4. Essential hypertension Continue antihypertensive medications as already ordered, these medications have been reviewed and there are no changes at this time.    Hortencia Pilar, MD  01/22/2016 2:24 PM

## 2016-01-22 NOTE — Pre-Procedure Instructions (Signed)
CLEARED BY DR BABOFF 01/19/16. RECHECK KT NORMAL

## 2016-01-24 ENCOUNTER — Encounter: Payer: Self-pay | Admitting: Orthopedic Surgery

## 2016-01-24 ENCOUNTER — Inpatient Hospital Stay: Payer: Medicare Other

## 2016-01-24 ENCOUNTER — Encounter
Admission: RE | Disposition: A | Discharge status: Home Health | Payer: Self-pay | Source: Ambulatory Visit | Attending: Orthopedic Surgery

## 2016-01-24 ENCOUNTER — Inpatient Hospital Stay
Admission: RE | Admit: 2016-01-24 | Discharge: 2016-01-26 | DRG: 470 | Disposition: A | Discharge status: Home Health | Payer: Medicare Other | Source: Ambulatory Visit | Attending: Orthopedic Surgery | Admitting: Orthopedic Surgery

## 2016-01-24 ENCOUNTER — Inpatient Hospital Stay: Payer: Medicare Other | Admitting: Anesthesiology

## 2016-01-24 DIAGNOSIS — M1711 Unilateral primary osteoarthritis, right knee: Secondary | ICD-10-CM | POA: Diagnosis present

## 2016-01-24 DIAGNOSIS — E785 Hyperlipidemia, unspecified: Secondary | ICD-10-CM | POA: Diagnosis present

## 2016-01-24 DIAGNOSIS — Z888 Allergy status to other drugs, medicaments and biological substances status: Secondary | ICD-10-CM

## 2016-01-24 DIAGNOSIS — I10 Essential (primary) hypertension: Secondary | ICD-10-CM | POA: Diagnosis present

## 2016-01-24 DIAGNOSIS — I739 Peripheral vascular disease, unspecified: Secondary | ICD-10-CM | POA: Diagnosis present

## 2016-01-24 DIAGNOSIS — Z853 Personal history of malignant neoplasm of breast: Secondary | ICD-10-CM | POA: Diagnosis not present

## 2016-01-24 DIAGNOSIS — Z79899 Other long term (current) drug therapy: Secondary | ICD-10-CM

## 2016-01-24 DIAGNOSIS — Z96659 Presence of unspecified artificial knee joint: Secondary | ICD-10-CM

## 2016-01-24 DIAGNOSIS — M6281 Muscle weakness (generalized): Secondary | ICD-10-CM

## 2016-01-24 DIAGNOSIS — R262 Difficulty in walking, not elsewhere classified: Secondary | ICD-10-CM

## 2016-01-24 DIAGNOSIS — M25561 Pain in right knee: Secondary | ICD-10-CM

## 2016-01-24 HISTORY — PX: JOINT REPLACEMENT: SHX530

## 2016-01-24 HISTORY — PX: KNEE ARTHROPLASTY: SHX992

## 2016-01-24 LAB — ABO/RH: ABO/RH(D): O POS

## 2016-01-24 LAB — POCT I-STAT 4, (NA,K, GLUC, HGB,HCT)
GLUCOSE: 91 mg/dL (ref 65–99)
HCT: 37 % (ref 36.0–46.0)
Hemoglobin: 12.6 g/dL (ref 12.0–15.0)
POTASSIUM: 3.6 mmol/L (ref 3.5–5.1)
SODIUM: 139 mmol/L (ref 135–145)

## 2016-01-24 SURGERY — ARTHROPLASTY, KNEE, TOTAL, USING IMAGELESS COMPUTER-ASSISTED NAVIGATION
Anesthesia: Spinal | Laterality: Right | Wound class: Clean

## 2016-01-24 MED ORDER — FENTANYL CITRATE (PF) 100 MCG/2ML IJ SOLN
INTRAMUSCULAR | Status: AC
  Filled 2016-01-24: qty 2

## 2016-01-24 MED ORDER — MORPHINE SULFATE (PF) 2 MG/ML IV SOLN: 2.0000 mg | INTRAVENOUS | Status: DC | PRN

## 2016-01-24 MED ORDER — CHLORHEXIDINE GLUCONATE 4 % EX LIQD
60.0000 mL | Freq: Once | CUTANEOUS | Status: DC
Start: 2016-01-24 — End: 2016-01-24

## 2016-01-24 MED ORDER — SODIUM CHLORIDE 0.9 % IJ SOLN
INTRAMUSCULAR | Status: AC
  Filled 2016-01-24: qty 50

## 2016-01-24 MED ORDER — FAMOTIDINE 20 MG PO TABS
20.0000 mg | ORAL_TABLET | Freq: Once | ORAL | Status: AC
  Administered 2016-01-24: 20 mg via ORAL

## 2016-01-24 MED ORDER — ADULT MULTIVITAMIN W/MINERALS CH
1.0000 | ORAL_TABLET | Freq: Every day | ORAL | Status: DC
  Administered 2016-01-25 – 2016-01-26 (×2): 1 via ORAL
  Filled 2016-01-24 (×2): qty 1

## 2016-01-24 MED ORDER — BUPIVACAINE LIPOSOME 1.3 % IJ SUSP
INTRAMUSCULAR | Status: DC | PRN
  Administered 2016-01-24: 60 mL

## 2016-01-24 MED ORDER — SODIUM CHLORIDE 0.9 % IV SOLN
INTRAVENOUS | Status: DC
  Administered 2016-01-25: 01:00:00 via INTRAVENOUS

## 2016-01-24 MED ORDER — MIDAZOLAM HCL 2 MG/2ML IJ SOLN
INTRAMUSCULAR | Status: AC
  Filled 2016-01-24: qty 2

## 2016-01-24 MED ORDER — ONDANSETRON HCL 4 MG/2ML IJ SOLN: 4.0000 mg | Freq: Once | INTRAMUSCULAR | Status: DC | PRN

## 2016-01-24 MED ORDER — ONDANSETRON HCL 4 MG PO TABS: 4.0000 mg | ORAL_TABLET | Freq: Four times a day (QID) | ORAL | Status: DC | PRN

## 2016-01-24 MED ORDER — ATENOLOL 50 MG PO TABS
100.0000 mg | ORAL_TABLET | Freq: Every day | ORAL | Status: DC
  Administered 2016-01-25 – 2016-01-26 (×2): 100 mg via ORAL
  Filled 2016-01-24 (×2): qty 2

## 2016-01-24 MED ORDER — ACETAMINOPHEN 325 MG PO TABS: 650.0000 mg | ORAL_TABLET | Freq: Four times a day (QID) | ORAL | Status: DC | PRN

## 2016-01-24 MED ORDER — ENOXAPARIN SODIUM 30 MG/0.3ML ~~LOC~~ SOLN
30.0000 mg | Freq: Two times a day (BID) | SUBCUTANEOUS | Status: DC
  Administered 2016-01-25 – 2016-01-26 (×3): 30 mg via SUBCUTANEOUS
  Filled 2016-01-24 (×3): qty 0.3

## 2016-01-24 MED ORDER — PHENOL 1.4 % MT LIQD
1.0000 | OROMUCOSAL | Status: DC | PRN
  Filled 2016-01-24: qty 177

## 2016-01-24 MED ORDER — AMLODIPINE BESYLATE 10 MG PO TABS
10.0000 mg | ORAL_TABLET | Freq: Every day | ORAL | Status: DC
  Administered 2016-01-25: 10 mg via ORAL
  Filled 2016-01-24: qty 1

## 2016-01-24 MED ORDER — NEOMYCIN-POLYMYXIN B GU 40-200000 IR SOLN
Status: AC
  Filled 2016-01-24: qty 20

## 2016-01-24 MED ORDER — SENNOSIDES-DOCUSATE SODIUM 8.6-50 MG PO TABS
1.0000 | ORAL_TABLET | Freq: Two times a day (BID) | ORAL | Status: DC
  Administered 2016-01-25 (×2): 1 via ORAL
  Filled 2016-01-24 (×3): qty 1

## 2016-01-24 MED ORDER — TRAMADOL HCL 50 MG PO TABS: 50.0000 mg | ORAL_TABLET | ORAL | Status: DC | PRN

## 2016-01-24 MED ORDER — CEFAZOLIN SODIUM-DEXTROSE 2-3 GM-% IV SOLR
INTRAVENOUS | Status: DC | PRN
  Administered 2016-01-24: 2 g via INTRAVENOUS

## 2016-01-24 MED ORDER — SIMVASTATIN 20 MG PO TABS
10.0000 mg | ORAL_TABLET | Freq: Every day | ORAL | Status: DC
  Administered 2016-01-25: 10 mg via ORAL
  Filled 2016-01-24: qty 1

## 2016-01-24 MED ORDER — PANTOPRAZOLE SODIUM 40 MG PO TBEC
40.0000 mg | DELAYED_RELEASE_TABLET | Freq: Two times a day (BID) | ORAL | Status: DC
  Administered 2016-01-24 – 2016-01-26 (×4): 40 mg via ORAL
  Filled 2016-01-24 (×4): qty 1

## 2016-01-24 MED ORDER — ACETAMINOPHEN 650 MG RE SUPP: 650.0000 mg | Freq: Four times a day (QID) | RECTAL | Status: DC | PRN

## 2016-01-24 MED ORDER — FERROUS SULFATE 325 (65 FE) MG PO TABS
325.0000 mg | ORAL_TABLET | Freq: Two times a day (BID) | ORAL | Status: DC
  Administered 2016-01-25 – 2016-01-26 (×3): 325 mg via ORAL
  Filled 2016-01-24 (×3): qty 1

## 2016-01-24 MED ORDER — NEOMYCIN-POLYMYXIN B GU 40-200000 IR SOLN
Status: DC | PRN
  Administered 2016-01-24: 14 mL

## 2016-01-24 MED ORDER — BUPIVACAINE HCL (PF) 0.25 % IJ SOLN
INTRAMUSCULAR | Status: AC
  Filled 2016-01-24: qty 60

## 2016-01-24 MED ORDER — CEFAZOLIN SODIUM-DEXTROSE 2-4 GM/100ML-% IV SOLN: 2.0000 g | INTRAVENOUS | Status: DC

## 2016-01-24 MED ORDER — ONDANSETRON HCL 4 MG/2ML IJ SOLN: 4.0000 mg | Freq: Four times a day (QID) | INTRAMUSCULAR | Status: DC | PRN

## 2016-01-24 MED ORDER — CALTRATE 600+D PLUS MINERALS 600-800 MG-UNIT PO CHEW: 1.0000 | CHEWABLE_TABLET | Freq: Every day | ORAL | Status: DC

## 2016-01-24 MED ORDER — FENTANYL CITRATE (PF) 100 MCG/2ML IJ SOLN
25.0000 ug | INTRAMUSCULAR | Status: DC | PRN
  Administered 2016-01-24 (×3): 25 ug via INTRAVENOUS

## 2016-01-24 MED ORDER — SODIUM CHLORIDE 0.9 % IV SOLN
INTRAVENOUS | Status: DC | PRN
  Administered 2016-01-24: 20 ug/min via INTRAVENOUS

## 2016-01-24 MED ORDER — ALUM & MAG HYDROXIDE-SIMETH 200-200-20 MG/5ML PO SUSP: 30.0000 mL | ORAL | Status: DC | PRN

## 2016-01-24 MED ORDER — CEFAZOLIN SODIUM-DEXTROSE 2-4 GM/100ML-% IV SOLN
2.0000 g | Freq: Four times a day (QID) | INTRAVENOUS | Status: AC
  Administered 2016-01-24 – 2016-01-25 (×4): 2 g via INTRAVENOUS
  Filled 2016-01-24 (×5): qty 100

## 2016-01-24 MED ORDER — ALISKIREN FUMARATE 150 MG PO TABS
300.0000 mg | ORAL_TABLET | Freq: Every day | ORAL | Status: DC
  Administered 2016-01-26: 300 mg via ORAL
  Filled 2016-01-24 (×2): qty 2

## 2016-01-24 MED ORDER — DIPHENHYDRAMINE HCL 12.5 MG/5ML PO ELIX: 12.5000 mg | ORAL_SOLUTION | ORAL | Status: DC | PRN

## 2016-01-24 MED ORDER — CALCIUM CITRATE-VITAMIN D 500-400 MG-UNIT PO CHEW
1.0000 | CHEWABLE_TABLET | Freq: Every day | ORAL | Status: DC
Start: 2016-01-25 — End: 2016-01-26
  Administered 2016-01-25 – 2016-01-26 (×2): 1 via ORAL
  Filled 2016-01-24 (×2): qty 1

## 2016-01-24 MED ORDER — BUPIVACAINE HCL (PF) 0.25 % IJ SOLN
INTRAMUSCULAR | Status: DC | PRN
  Administered 2016-01-24: 60 mL

## 2016-01-24 MED ORDER — CELECOXIB 200 MG PO CAPS
200.0000 mg | ORAL_CAPSULE | Freq: Two times a day (BID) | ORAL | Status: DC
  Administered 2016-01-24 – 2016-01-26 (×4): 200 mg via ORAL
  Filled 2016-01-24 (×4): qty 1

## 2016-01-24 MED ORDER — BISACODYL 10 MG RE SUPP
10.0000 mg | Freq: Every day | RECTAL | Status: DC | PRN
Start: 2016-01-24 — End: 2016-01-26

## 2016-01-24 MED ORDER — TRANEXAMIC ACID 1000 MG/10ML IV SOLN
INTRAVENOUS | Status: DC | PRN
  Administered 2016-01-24: 1000 mg via INTRAVENOUS

## 2016-01-24 MED ORDER — MENTHOL 3 MG MT LOZG
1.0000 | LOZENGE | OROMUCOSAL | Status: DC | PRN
  Filled 2016-01-24: qty 9

## 2016-01-24 MED ORDER — FENTANYL CITRATE (PF) 100 MCG/2ML IJ SOLN
INTRAMUSCULAR | Status: AC
  Administered 2016-01-24: 25 ug via INTRAVENOUS
  Filled 2016-01-24: qty 2

## 2016-01-24 MED ORDER — MULTI-VITAMINS PO TABS: 1.0000 | ORAL_TABLET | Freq: Every day | ORAL | Status: DC

## 2016-01-24 MED ORDER — KETAMINE HCL 10 MG/ML IJ SOLN
INTRAMUSCULAR | Status: AC
  Filled 2016-01-24: qty 1

## 2016-01-24 MED ORDER — TRANEXAMIC ACID 1000 MG/10ML IV SOLN
1000.0000 mg | INTRAVENOUS | Status: DC
  Filled 2016-01-24: qty 10

## 2016-01-24 MED ORDER — MAGNESIUM HYDROXIDE 400 MG/5ML PO SUSP: 30.0000 mL | Freq: Every day | ORAL | Status: DC | PRN

## 2016-01-24 MED ORDER — HYDROCHLOROTHIAZIDE 25 MG PO TABS
25.0000 mg | ORAL_TABLET | Freq: Every day | ORAL | Status: DC
  Administered 2016-01-25 – 2016-01-26 (×2): 25 mg via ORAL
  Filled 2016-01-24 (×2): qty 1

## 2016-01-24 MED ORDER — OXYCODONE HCL 5 MG PO TABS
5.0000 mg | ORAL_TABLET | ORAL | Status: DC | PRN
  Administered 2016-01-24: 10 mg via ORAL
  Administered 2016-01-25 – 2016-01-26 (×3): 5 mg via ORAL
  Filled 2016-01-24: qty 1
  Filled 2016-01-24: qty 2
  Filled 2016-01-24 (×3): qty 1

## 2016-01-24 MED ORDER — FAMOTIDINE 20 MG PO TABS
ORAL_TABLET | ORAL | Status: AC
  Administered 2016-01-24: 20 mg via ORAL
  Filled 2016-01-24: qty 1

## 2016-01-24 MED ORDER — PROPOFOL 500 MG/50ML IV EMUL
INTRAVENOUS | Status: DC | PRN
  Administered 2016-01-24: 60 ug/kg/min via INTRAVENOUS

## 2016-01-24 MED ORDER — FENTANYL CITRATE (PF) 100 MCG/2ML IJ SOLN
INTRAMUSCULAR | Status: DC | PRN
  Administered 2016-01-24 (×3): 25 ug via INTRAVENOUS
  Administered 2016-01-24: 50 ug via INTRAVENOUS
  Administered 2016-01-24: 25 ug via INTRAVENOUS

## 2016-01-24 MED ORDER — FLEET ENEMA 7-19 GM/118ML RE ENEM: 1.0000 | ENEMA | Freq: Once | RECTAL | Status: DC | PRN

## 2016-01-24 MED ORDER — PROPOFOL 500 MG/50ML IV EMUL
INTRAVENOUS | Status: AC
  Filled 2016-01-24: qty 50

## 2016-01-24 MED ORDER — TIMOLOL MALEATE 0.5 % OP SOLN
1.0000 [drp] | Freq: Every day | OPHTHALMIC | Status: DC
  Administered 2016-01-24: 1 [drp] via OPHTHALMIC
  Filled 2016-01-24: qty 5

## 2016-01-24 MED ORDER — POTASSIUM CHLORIDE CRYS ER 20 MEQ PO TBCR
20.0000 meq | EXTENDED_RELEASE_TABLET | Freq: Every day | ORAL | Status: DC
  Administered 2016-01-24 – 2016-01-26 (×3): 20 meq via ORAL
  Filled 2016-01-24 (×3): qty 1

## 2016-01-24 MED ORDER — METOCLOPRAMIDE HCL 10 MG PO TABS
10.0000 mg | ORAL_TABLET | Freq: Three times a day (TID) | ORAL | Status: DC
  Administered 2016-01-24 – 2016-01-25 (×3): 10 mg via ORAL
  Filled 2016-01-24 (×3): qty 1

## 2016-01-24 MED ORDER — BUPIVACAINE LIPOSOME 1.3 % IJ SUSP
INTRAMUSCULAR | Status: AC
  Filled 2016-01-24: qty 20

## 2016-01-24 MED ORDER — PROPOFOL 10 MG/ML IV BOLUS
INTRAVENOUS | Status: DC | PRN
  Administered 2016-01-24: 13 mg via INTRAVENOUS

## 2016-01-24 MED ORDER — ACETAMINOPHEN 10 MG/ML IV SOLN
1000.0000 mg | Freq: Four times a day (QID) | INTRAVENOUS | Status: AC
  Administered 2016-01-24 – 2016-01-25 (×3): 1000 mg via INTRAVENOUS
  Filled 2016-01-24 (×5): qty 100

## 2016-01-24 MED ORDER — TRANEXAMIC ACID 1000 MG/10ML IV SOLN
1000.0000 mg | Freq: Once | INTRAVENOUS | Status: AC
  Administered 2016-01-24: 1000 mg via INTRAVENOUS
  Filled 2016-01-24: qty 10

## 2016-01-24 MED ORDER — LACTATED RINGERS IV SOLN
INTRAVENOUS | Status: DC
  Administered 2016-01-24 (×2): via INTRAVENOUS

## 2016-01-24 MED ORDER — CEFAZOLIN SODIUM-DEXTROSE 2-4 GM/100ML-% IV SOLN
INTRAVENOUS | Status: AC
  Filled 2016-01-24: qty 100

## 2016-01-24 MED ORDER — MIDAZOLAM HCL 5 MG/5ML IJ SOLN
INTRAMUSCULAR | Status: DC | PRN
  Administered 2016-01-24: 2 mg via INTRAVENOUS

## 2016-01-24 SURGICAL SUPPLY — 64 items
AUTOTRANSFUS HAS 1/8 (MISCELLANEOUS) ×2
BATTERY INSTRU NAVIGATION (MISCELLANEOUS) ×8 IMPLANT
BLADE SAW 1 (BLADE) ×2 IMPLANT
BLADE SAW 1/2 (BLADE) ×2 IMPLANT
BLADE SAW 70X12.5 (BLADE) IMPLANT
BTRY SRG DRVR LF (MISCELLANEOUS) ×4
CANISTER SUCT 1200ML W/VALVE (MISCELLANEOUS) ×2 IMPLANT
CANISTER SUCT 3000ML (MISCELLANEOUS) ×4 IMPLANT
CAPT KNEE TOTAL 3 ATTUNE ×1 IMPLANT
CATH TRAY METER 16FR LF (MISCELLANEOUS) ×2 IMPLANT
CEMENT HV SMART SET (Cement) ×4 IMPLANT
COOLER POLAR GLACIER W/PUMP (MISCELLANEOUS) ×2 IMPLANT
CUFF TOURN 24 STER (MISCELLANEOUS) IMPLANT
CUFF TOURN 30 STER DUAL PORT (MISCELLANEOUS) IMPLANT
DRAPE SHEET LG 3/4 BI-LAMINATE (DRAPES) ×2 IMPLANT
DRSG DERMACEA 8X12 NADH (GAUZE/BANDAGES/DRESSINGS) ×2 IMPLANT
DRSG OPSITE POSTOP 4X10 (GAUZE/BANDAGES/DRESSINGS) ×1 IMPLANT
DRSG OPSITE POSTOP 4X14 (GAUZE/BANDAGES/DRESSINGS) ×2 IMPLANT
DRSG TEGADERM 4X4.75 (GAUZE/BANDAGES/DRESSINGS) ×2 IMPLANT
DURAPREP 26ML APPLICATOR (WOUND CARE) ×4 IMPLANT
ELECT CAUTERY BLADE 6.4 (BLADE) ×2 IMPLANT
ELECT REM PT RETURN 9FT ADLT (ELECTROSURGICAL) ×2
ELECTRODE REM PT RTRN 9FT ADLT (ELECTROSURGICAL) ×1 IMPLANT
EX-PIN ORTHOLOCK NAV 4X150 (PIN) ×4 IMPLANT
GLOVE BIOGEL M STRL SZ7.5 (GLOVE) ×4 IMPLANT
GLOVE INDICATOR 8.0 STRL GRN (GLOVE) ×2 IMPLANT
GLOVE SURG 9.0 ORTHO LTXF (GLOVE) ×2 IMPLANT
GLOVE SURG ORTHO 9.0 STRL STRW (GLOVE) ×2 IMPLANT
GOWN STRL REUS W/ TWL LRG LVL3 (GOWN DISPOSABLE) ×2 IMPLANT
GOWN STRL REUS W/TWL 2XL LVL3 (GOWN DISPOSABLE) ×2 IMPLANT
GOWN STRL REUS W/TWL LRG LVL3 (GOWN DISPOSABLE) ×4
HANDPIECE INTERPULSE COAX TIP (DISPOSABLE) ×2
HOLDER FOLEY CATH W/STRAP (MISCELLANEOUS) ×2 IMPLANT
HOOD PEEL AWAY FLYTE STAYCOOL (MISCELLANEOUS) ×4 IMPLANT
KIT RM TURNOVER STRD PROC AR (KITS) ×2 IMPLANT
KNIFE SCULPS 14X20 (INSTRUMENTS) ×2 IMPLANT
LABEL OR SOLS (LABEL) ×2 IMPLANT
NDL SAFETY 18GX1.5 (NEEDLE) ×2 IMPLANT
NDL SPNL 20GX3.5 QUINCKE YW (NEEDLE) ×1 IMPLANT
NEEDLE SPNL 20GX3.5 QUINCKE YW (NEEDLE) ×2 IMPLANT
NS IRRIG 500ML POUR BTL (IV SOLUTION) ×2 IMPLANT
PACK TOTAL KNEE (MISCELLANEOUS) ×2 IMPLANT
PAD WRAPON POLAR KNEE (MISCELLANEOUS) ×1 IMPLANT
PIN DRILL QUICK PACK ×2 IMPLANT
PIN FIXATION 1/8DIA X 3INL (PIN) ×2 IMPLANT
SET HNDPC FAN SPRY TIP SCT (DISPOSABLE) ×1 IMPLANT
SOL .9 NS 3000ML IRR  AL (IV SOLUTION) ×1
SOL .9 NS 3000ML IRR AL (IV SOLUTION) ×1
SOL .9 NS 3000ML IRR UROMATIC (IV SOLUTION) ×1 IMPLANT
SOL PREP PVP 2OZ (MISCELLANEOUS) ×2
SOLUTION PREP PVP 2OZ (MISCELLANEOUS) ×1 IMPLANT
SPONGE DRAIN TRACH 4X4 STRL 2S (GAUZE/BANDAGES/DRESSINGS) ×2 IMPLANT
STAPLER SKIN PROX 35W (STAPLE) ×2 IMPLANT
SUCTION FRAZIER HANDLE 10FR (MISCELLANEOUS) ×1
SUCTION TUBE FRAZIER 10FR DISP (MISCELLANEOUS) ×1 IMPLANT
SUT VIC AB 0 CT1 36 (SUTURE) ×2 IMPLANT
SUT VIC AB 1 CT1 36 (SUTURE) ×4 IMPLANT
SUT VIC AB 2-0 CT2 27 (SUTURE) ×2 IMPLANT
SYR 20CC LL (SYRINGE) ×2 IMPLANT
SYR 30ML LL (SYRINGE) ×4 IMPLANT
SYSTEM AUTOTRANSFUS DUAL TROCR (MISCELLANEOUS) ×1 IMPLANT
TOWEL OR 17X26 4PK STRL BLUE (TOWEL DISPOSABLE) ×2 IMPLANT
TOWER CARTRIDGE SMART MIX (DISPOSABLE) ×2 IMPLANT
WRAPON POLAR PAD KNEE (MISCELLANEOUS) ×2

## 2016-01-24 NOTE — Transfer of Care (Signed)
Immediate Anesthesia Transfer of Care Note  Patient: [name]  Procedure(s) Performed: Procedure(s): COMPUTER ASSISTED TOTAL KNEE ARTHROPLASTY (Right)  Patient Location: PACU  Anesthesia Type:Spinal  Level of Consciousness: awake and patient cooperative  Airway & Oxygen Therapy: Patient Spontanous Breathing and Patient connected to nasal cannula oxygen  Post-op Assessment: Report given to RN and Post -op Vital signs reviewed and stable  Post vital signs: Reviewed and stable  Last Vitals:  Vitals:   01/24/16 1114  BP: (!) 162/51  Pulse: 68  Resp: 16  Temp: 36.7 C    Last Pain:  Vitals:   01/24/16 1114  TempSrc: Oral         Complications: No apparent anesthesia complications

## 2016-01-24 NOTE — Anesthesia Postprocedure Evaluation (Signed)
Anesthesia Post Note  Patient: [name]  Procedure(s) Performed: Procedure(s) (LRB): COMPUTER ASSISTED TOTAL KNEE ARTHROPLASTY (Right)  Patient location during evaluation: PACU Anesthesia Type: Spinal Level of consciousness: awake and alert Pain management: pain level controlled Vital Signs Assessment: post-procedure vital signs reviewed and stable Respiratory status: spontaneous breathing, nonlabored ventilation, respiratory function stable and patient connected to nasal cannula oxygen Cardiovascular status: blood pressure returned to baseline and stable Postop Assessment: no signs of nausea or vomiting Anesthetic complications: no     Last Vitals:  Vitals:   01/24/16 1813 01/24/16 1830  BP: (!) 140/58 (!) 149/58  Pulse: (!) 59 67  Resp: 16 16  Temp: 36.3 C 36.3 C    Last Pain:  Vitals:   01/24/16 1830  TempSrc: Oral  PainSc:                  Precious Haws Emberlee Sortino

## 2016-01-24 NOTE — Brief Op Note (Signed)
01/24/2016  5:28 PM  PATIENT:  [name]  76 y.o. female  PRE-OPERATIVE DIAGNOSIS:  PRIMARY OSTEOARTHRITIS RIGHT KNEE  POST-OPERATIVE DIAGNOSIS:  PRIMARY OSTEOARTHRITIS RIGHT KNEE  PROCEDURE:  Procedure(s): COMPUTER ASSISTED TOTAL KNEE ARTHROPLASTY (Right)  SURGEON:  Surgeon(s) and Role:    * Dereck Leep, MD - Primary  ASSISTANTS: Vance Peper, PA   ANESTHESIA:   spinal  EBL:  Total I/O In: 1100 [I.V.:1100] Out: 150 [Urine:100; Blood:50]  BLOOD ADMINISTERED:none  DRAINS: 2 medium drains to a reinfusion system   LOCAL MEDICATIONS USED:  MARCAINE    and OTHER Exparel  SPECIMEN:  No Specimen  DISPOSITION OF SPECIMEN:  N/A  COUNTS:  YES  TOURNIQUET:    DICTATION: .Dragon Dictation  PLAN OF CARE: Admit to inpatient   PATIENT DISPOSITION:  PACU - hemodynamically stable.   Delay start of Pharmacological VTE agent (>24hrs) due to surgical blood loss or risk of bleeding: yes

## 2016-01-24 NOTE — Op Note (Signed)
OPERATIVE NOTE  DATE OF SURGERY:  01/24/2016  PATIENT NAME:  [name]   DOB: 1940-08-18  MRN: [id number]  PRE-OPERATIVE DIAGNOSIS: Degenerative arthrosis of the right knee, primary  POST-OPERATIVE DIAGNOSIS:  Same  PROCEDURE:  Right total knee arthroplasty  SURGEON:  Marciano Sequin. M.D.  ASSISTANT:  Vance Peper, PA (present and scrubbed throughout the case, critical for assistance with exposure, retraction, instrumentation, and closure)  ANESTHESIA: spinal  ESTIMATED BLOOD LOSS: 50 mL  FLUIDS REPLACED: 1100 mL of crystalloid  TOURNIQUET TIME: 112 minutes  DRAINS: 2 medium drains to a reinfusion system  SOFT TISSUE RELEASES: Anterior cruciate ligament, posterior cruciate ligament, deep and superficial medial collateral ligament, patellofemoral ligament  IMPLANTS UTILIZED: DePuy Attune size 4 posterior stabilized femoral component (cemented), size 5 rotating platform tibial component (cemented), 35 mm medialized dome patella (cemented), and a 8 mm stabilized rotating platform polyethylene insert.  INDICATIONS FOR SURGERY: [name] is a 76 y.o. year old female with a long history of progressive knee pain. X-rays demonstrated severe degenerative changes in tricompartmental fashion. The patient had not seen any significant improvement despite conservative nonsurgical intervention. After discussion of the risks and benefits of surgical intervention, the patient expressed understanding of the risks benefits and agree with plans for total knee arthroplasty.   The risks, benefits, and alternatives were discussed at length including but not limited to the risks of infection, bleeding, nerve injury, stiffness, blood clots, the need for revision surgery, cardiopulmonary complications, among others, and they were willing to proceed.  PROCEDURE IN DETAIL: The patient was brought into the operating room and, after adequate spinal anesthesia was achieved, a tourniquet was placed on  the patient's upper thigh. The patient's knee and leg were cleaned and prepped with alcohol and DuraPrep and draped in the usual sterile fashion. A "timeout" was performed as per usual protocol. The lower extremity was exsanguinated using an Esmarch, and the tourniquet was inflated to 300 mmHg. An anterior longitudinal incision was made followed by a standard mid vastus approach. The deep fibers of the medial collateral ligament were elevated in a subperiosteal fashion off of the medial flare of the tibia so as to maintain a continuous soft tissue sleeve. The patella was subluxed laterally and the patellofemoral ligament was incised. Inspection of the knee demonstrated severe degenerative changes with full-thickness loss of articular cartilage. Osteophytes were debrided using a rongeur. Anterior and posterior cruciate ligaments were excised. A pilot hole was made just anterior to the insertion of the anterior cruciate ligament and intramedullary rod was placed. The distal femoral cutting guide was positioned using computer-assisted navigation so as to achieve a 5 distal valgus cut. The femur was sized and it was felt that a size 4 femoral component was appropriate. A size 4 femoral cutting guide was positioned and the anterior cut was performed and verified using the computer. This was followed by completion of the posterior and chamfer cuts. Femoral cutting guide for the central box was then positioned in the center box cut was performed.  Attention was then directed to the proximal tibia. Medial and lateral menisci were excised. The extramedullary tibial cutting guide was positioned using computer-assisted navigation so as to achieve a 0 varus-valgus alignment and 3 posterior slope. The cut was performed and verified using the computer. The proximal tibia was sized and it was felt that a size 5 tibial tray was appropriate. Tibial and femoral trials were inserted followed by insertion of a 5 mm polyethylene  insert.  The knee was felt to be tight laterally. A Cobb elevator was used to elevate the superficial fibers of the palmar ligament. The 5 mm polyethylene insert was replaced by an 8 mm polyethylene insert. This allowed for excellent mediolateral soft tissue balancing both in flexion and in full extension. Finally, the patella was cut and prepared so as to accommodate a 35 mm medialized dome patella. A patella trial was placed and the knee was placed through a range of motion with excellent patellar tracking appreciated. The femoral trial was removed after debridement of posterior osteophytes. The central post-hole for the tibial component was reamed followed by insertion of a keel punch. Tibial trials were then removed. Cut surfaces of bone were irrigated with copious amounts of normal saline with antibiotic solution using pulsatile lavage and then suctioned dry. Polymethylmethacrylate cement with gentamicin was prepared in the usual fashion using a vacuum mixer. Cement was applied to the cut surface of the proximal tibia as well as along the undersurface of a size 5 rotating platform tibial component. Tibial component was positioned and impacted into place. Excess cement was removed using [employer] fast streamer. Cement was then applied to the cut surfaces of the femur as well as along the posterior flanges of the size 4 femoral component. The femoral component was positioned and impacted into place. Excess cement was removed using [employer] fast streamer. An 8 mm polyethylene trial was inserted and the knee was brought into full extension with steady axial compression applied. Finally, cement was applied to the backside of a 35 mm medialized dome patella and the patellar component was positioned and patellar clamp applied. Excess cement was removed using [employer] fast streamer. After adequate curing of the cement, the tourniquet was deflated after a total tourniquet time of 112 minutes. Hemostasis was achieved using electrocautery. The  knee was irrigated with copious amounts of normal saline with antibiotic solution using pulsatile lavage and then suctioned dry. 20 mL of 1.3% Exparel and 60 mL of 0.25% Marcaine in 40 mL of normal saline was injected along the posterior capsule, medial and lateral gutters, and along the arthrotomy site. An 8 mm stabilized rotating platform polyethylene insert was inserted and the knee was placed through a range of motion with excellent mediolateral soft tissue balancing appreciated and excellent patellar tracking noted. 2 medium drains were placed in the wound bed and brought out through separate stab incisions to be attached to a reinfusion system. The medial parapatellar portion of the incision was reapproximated using interrupted sutures of #1 Vicryl. Subcutaneous tissue was approximated in layers using first #0 Vicryl followed #2-0 Vicryl. The skin was approximated with skin staples. A sterile dressing was applied.  The patient tolerated the procedure well and was transported to the recovery room in stable condition.    James P. Holley Bouche., M.D.

## 2016-01-24 NOTE — H&P (Signed)
The patient has been re-examined, and the chart reviewed, and there have been no interval changes to the documented history and physical.    The risks, benefits, and alternatives have been discussed at length. The patient expressed understanding of the risks benefits and agreed with plans for surgical intervention.  Mearl Olver P. Jourden Gilson, Jr. M.D.    

## 2016-01-24 NOTE — Anesthesia Preprocedure Evaluation (Addendum)
Anesthesia Evaluation  Patient identified by MRN, date of birth, ID band Patient awake    Reviewed: Allergy & Precautions, NPO status , Patient's Chart, lab work & pertinent test results, reviewed documented beta blocker date and time   Airway Mallampati: II  TM Distance: <3 FB     Dental  (+) Upper Dentures, Lower Dentures   Pulmonary former smoker,    Pulmonary exam normal        Cardiovascular hypertension, Pt. on medications and Pt. on home beta blockers + Peripheral Vascular Disease  Normal cardiovascular exam     Neuro/Psych negative neurological ROS  negative psych ROS   GI/Hepatic negative GI ROS, Neg liver ROS,   Endo/Other  negative endocrine ROS  Renal/GU negative Renal ROS  negative genitourinary   Musculoskeletal  (+) Arthritis , Osteoarthritis,    Abdominal   Peds negative pediatric ROS (+)  Hematology negative hematology ROS (+)   Anesthesia Other Findings Past Medical History: No date: Arthritis 2011: Breast cancer ([place])     Comment: radiation- Left No date: Hypertension No date: Personal history of radiation therapy No date: PVD (peripheral vascular disease) ([facility])  Reproductive/Obstetrics                            Anesthesia Physical Anesthesia Plan  ASA: III  Anesthesia Plan:    Post-op Pain Management:    Induction: Intravenous  Airway Management Planned:   Additional Equipment:   Intra-op Plan:   Post-operative Plan:   Informed Consent: I have reviewed the patients History and Physical, chart, labs and discussed the procedure including the risks, benefits and alternatives for the proposed anesthesia with the patient or authorized representative who has indicated his/her understanding and acceptance.   Dental advisory given  Plan Discussed with: CRNA and Surgeon  Anesthesia Plan Comments:         Anesthesia Quick Evaluation

## 2016-01-24 NOTE — Anesthesia Procedure Notes (Signed)
Spinal  Patient location during procedure: OR Start time: 01/24/2016 1:48 PM End time: 01/24/2016 1:51 PM Staffing Anesthesiologist: Alvin Critchley Performed: anesthesiologist  Preanesthetic Checklist Completed: patient identified, site marked, surgical consent, pre-op evaluation, timeout performed, IV checked, risks and benefits discussed and monitors and equipment checked Spinal Block Patient position: sitting Prep: Betadine and site prepped and draped Patient monitoring: heart rate, cardiac monitor, continuous pulse ox and blood pressure Approach: right paramedian Location: L3-4 Injection technique: single-shot Needle Needle type: Quincke  Needle gauge: 25 G Assessment Sensory level: T8 Additional Notes Time out called.  Patient placed in sitting position.  Back prepped and draped in sterile fashion.  A skin wheal was made lateral to the L3-L4 interspace with 1% Lidocaine plain.  A 25G Quincke needle was advanced with the return of clear, colorless CSF in all 4 quadrants.  10 mg of spinal marcaine + 4 mg of spinal tetracaine was injected.  No blood or paresthesias.  The patient tolerated the procedure well.

## 2016-01-25 ENCOUNTER — Encounter: Payer: Self-pay | Admitting: Orthopedic Surgery

## 2016-01-25 LAB — BASIC METABOLIC PANEL
ANION GAP: 8 (ref 5–15)
BUN: 16 mg/dL (ref 6–20)
CALCIUM: 8.4 mg/dL — AB (ref 8.9–10.3)
CHLORIDE: 102 mmol/L (ref 101–111)
CO2: 25 mmol/L (ref 22–32)
CREATININE: 0.81 mg/dL (ref 0.44–1.00)
GFR calc non Af Amer: >60 mL/min (ref >60)
Glucose, Bld: 108 mg/dL — ABNORMAL HIGH (ref 65–99)
Potassium: 4 mmol/L (ref 3.5–5.1)
SODIUM: 135 mmol/L (ref 135–145)

## 2016-01-25 LAB — CBC
HEMATOCRIT: 33.1 % — AB (ref 35.0–47.0)
HEMOGLOBIN: 11.7 g/dL — AB (ref 12.0–16.0)
MCH: 35.6 pg — ABNORMAL HIGH (ref 26.0–34.0)
MCHC: 35.4 g/dL (ref 32.0–36.0)
MCV: 100.7 fL — ABNORMAL HIGH (ref 80.0–100.0)
Platelets: 126 10*3/uL — ABNORMAL LOW (ref 150–440)
RBC: 3.29 MIL/uL — ABNORMAL LOW (ref 3.80–5.20)
RDW: 12.6 % (ref 11.5–14.5)
WBC: 5.7 10*3/uL (ref 3.6–11.0)

## 2016-01-25 NOTE — Progress Notes (Signed)
   Subjective: 1 Day Post-Op Procedure(s) (LRB): COMPUTER ASSISTED TOTAL KNEE ARTHROPLASTY (Right) Patient reports pain as moderate.   Patient is well, and has had no acute complaints or problems We will start therapy today.  Plan is to go Home after hospital stay. no nausea and no vomiting Patient denies any chest pains or shortness of breath. Objective: Vital signs in last 24 hours: Temp:  [97.3 F (36.3 C)-98.7 F (37.1 C)] 98.6 F (37 C) (01/11 0333) Pulse Rate:  [55-68] 55 (01/11 0333) Resp:  [14-18] 18 (01/11 0333) BP: (111-162)/(43-71) 111/44 (01/11 0333) SpO2:  [89 %-100 %] 95 % (01/11 0333) Weight:  [65.3 kg (144 lb)] 65.3 kg (144 lb) (01/10 1114) Heels are non tender and elevated off the bed using rolled towels as well as bone foam  Intake/Output from previous day: 01/10 0701 - 01/11 0700 In: 1100 [I.V.:1100] Out: 1065 [Urine:890; Drains:125; Blood:50] Intake/Output this shift: No intake/output data recorded.   Recent Labs  01/24/16 1136 01/25/16 0420  HGB 12.6 11.7*    Recent Labs  01/24/16 1136 01/25/16 0420  WBC  --  5.7  RBC  --  3.29*  HCT 37.0 33.1*  PLT  --  126*    Recent Labs  01/24/16 1136 01/25/16 0420  NA 139 135  K 3.6 4.0  CL  --  102  CO2  --  25  BUN  --  16  CREATININE  --  0.81  GLUCOSE 91 108*  CALCIUM  --  8.4*   No results for input(s): LABPT, INR in the last 72 hours.  EXAM General - Patient is Alert, Appropriate and Oriented Extremity - Neurologically intact Neurovascular intact Sensation intact distally Intact pulses distally Dorsiflexion/Plantar flexion intact Compartment soft Dressing - dressing C/D/I Motor Function - intact, moving foot and toes well on exam.    Past Medical History:  Diagnosis Date  . Arthritis   . Breast cancer ([place]) 2011   radiation- Left  . Hypertension   . Personal history of radiation therapy   . PVD (peripheral vascular disease) ([facility])     Assessment/Plan: 1 Day Post-Op  Procedure(s) (LRB): COMPUTER ASSISTED TOTAL KNEE ARTHROPLASTY (Right) Active Problems:   S/P total knee arthroplasty  Estimated body mass index is 28.12 kg/m as calculated from the following:   Height as of this encounter: 5' (1.524 m).   Weight as of this encounter: 65.3 kg (144 lb). Advance diet Up with therapy D/C IV fluids Plan for discharge tomorrow Discharge home with home health  Labs: Were reviewed DVT Prophylaxis - Lovenox, Foot Pumps and TED hose Weight-Bearing as tolerated to right leg D/C O2 and Pulse OX and try on Room Air Begin working on bowel movement  [name] R. Hondo [place] 01/25/2016, 7:02 AM

## 2016-01-25 NOTE — Evaluation (Signed)
Physical Therapy Evaluation Patient Details Name: [name] MRN: [id number] DOB: [birth year] Today's Date: 01/25/2016   History of Present Illness  admitted for acute hospitalization status post R TKR, 01/24/15, WBAT  Clinical Impression  Upon evaluation, patient alert and oriented; follows all commands and demonstrates good insight/safety awareness.  Demonstrates excellent participation/effort with all therex/theract throughout session.   Demonstrates good post-op strength (4-/5) and ROM (6-80 degrees) to R knee; pain well-controlled, rated at 1-2/10 despite movement and WBing with mobility. Completes SLR with excellent control and quad activation, minimal/no lag noted; KI not required at this time.  Able to complete bed mobility with mod indep; sit/stand, basic transfers and gait (120') with RW, cga.  Good R LE WBing, knee control.  No significant buckling or safety concern noted.  Anticipate excellent progression towards all mobility goals. Would benefit from skilled PT to address above deficits and promote optimal return to PLOF; Recommend transition to [place] upon discharge from acute hospitalization.     Follow Up Recommendations Home health PT    Equipment Recommendations   (youth RW)    Recommendations for Other Services       Precautions / Restrictions Precautions Precautions: Fall Restrictions Weight Bearing Restrictions: Yes Other Position/Activity Restrictions: RLE WBAT      Mobility  Bed Mobility Overal bed mobility: Modified Independent                Transfers Overall transfer level: Needs assistance Equipment used: Rolling Rovner (2 wheeled) Transfers: Sit to/from Stand Sit to Stand: Min guard         General transfer comment: cuing for hand placement to maximize safety with movement transition  Ambulation/Gait Ambulation/Gait assistance: Min guard Ambulation Distance (Feet): 120 Feet Assistive device: Rolling Lhommedieu (2 wheeled)        General Gait Details: step to progressing to step through gait pattern; fair/good stance time R LE; slightly guarded with limited heel strike/toe off, limited R knee flex throughout gait cycle  Stairs            Wheelchair Mobility    Modified Rankin (Stroke Patients Only)       Balance Overall balance assessment: Needs assistance Sitting-balance support: No upper extremity supported;Feet supported Sitting balance-Leahy Scale: Good     Standing balance support: Bilateral upper extremity supported Standing balance-Leahy Scale: Good                               Pertinent Vitals/Pain Pain Assessment: 0-10 Pain Score: 1  Pain Location: R knee Pain Descriptors / Indicators: Aching Pain Intervention(s): Limited activity within patient's tolerance;Monitored during session;Repositioned    Home Living Family/patient expects to be discharged to:: Private residence Living Arrangements: Spouse/significant other Available Help at Discharge: Family;Available 24 hours/day Type of Home: House Home Access: Stairs to enter Entrance Stairs-Rails: Left Entrance Stairs-Number of Steps: 3-5 depending on front or back entrance (single rail at each entrance) Home Layout: Able to live on main level with bedroom/bathroom;Two level Home Equipment: Clinical cytogeneticist - standard;Adaptive equipment      Prior Function Level of Independence: Independent         Comments: Indep with ADLs, household and community mobility without assist device     Hand Dominance   Dominant Hand: Right    Extremity/Trunk Assessment   Upper Extremity Assessment Upper Extremity Assessment: Overall WFL for tasks assessed    Lower Extremity Assessment Lower Extremity Assessment:  (R knee at  least 4-/5, full sensation intact.  Otherwise, bilat LEs grossly WFL)    Cervical / Trunk Assessment Cervical / Trunk Assessment: Normal  Communication   Communication: No difficulties  Cognition  Arousal/Alertness: Awake/alert Behavior During Therapy: WFL for tasks assessed/performed Overall Cognitive Status: Within Functional Limits for tasks assessed                      General Comments      Exercises Total Joint Exercises Goniometric ROM: 6-80 degrees, act assist ROM Other Exercises Other Exercises: Supine LE therex, 1x10, AROM for muscular strength/flexibility with functional activities: ankle pumps, quad sets, SAQs, hip abduct/adduct, heel slides and SLR.  Excellent R quad activation and control; easily completes SLR with minimal/no lag noted.   Assessment/Plan    PT Assessment Patient needs continued PT services  PT Problem List Decreased strength;Decreased range of motion;Decreased activity tolerance;Decreased balance;Decreased mobility;Pain;Decreased skin integrity          PT Treatment Interventions DME instruction;Gait training;Functional mobility training;Stair training;Therapeutic activities;Therapeutic exercise;Balance training;Patient/family education    PT Goals (Current goals can be found in the Care Plan section)  Acute Rehab PT Goals Patient Stated Goal: to return home PT Goal Formulation: With patient/family Time For Goal Achievement: 02/08/16 Potential to Achieve Goals: Good    Frequency BID   Barriers to discharge        Co-evaluation               End of Session Equipment Utilized During Treatment: Gait belt Activity Tolerance: Patient tolerated treatment well Patient left: in chair;with call bell/phone within reach;with chair alarm set;with family/visitor present Nurse Communication: Mobility status         Time: 7915-0413 PT Time Calculation (min) (ACUTE ONLY): 29 min   Charges:   PT Evaluation $PT Eval Low Complexity: 1 Procedure PT Treatments $Therapeutic Exercise: 8-22 mins   PT G Codes:       Carlyle Achenbach H. Owens Shark, PT, DPT, NCS 01/25/16, 2:25 PM [phone]

## 2016-01-25 NOTE — Discharge Summary (Signed)
Physician Discharge Summary  Patient ID: [name] MRN: [id number] DOB/AGE: Mar 01, 1940 76 y.o.  Admit date: 01/24/2016 Discharge date: 01/26/2016  Admission Diagnoses:  PRIMARY OSTEOARTHRITIS RIGHT KNEE   Discharge Diagnoses: Patient Active Problem List   Diagnosis Date Noted  . 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  . Pain 01/22/2016  . 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/2016  . Peripheral vascular disease ([place]) 11/22/2014  . H/O malignant neoplasm of breast 11/22/2014  . HLD (hyperlipidemia) 11/22/2014  . BP (high blood pressure) 11/22/2014  . 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 Arthritis   . Breast cancer ([place]) 2011   radiation- Left  . Hypertension   . Personal history of radiation therapy   . PVD (peripheral vascular disease) ([place])      Transfusion: No transfusions given doing this admission   Consultants (if any):  case management for home health assistance  Discharged Condition: Improved  Hospital Course: [name] is an 76 y.o. female who was admitted 01/24/2016 with a diagnosis of degenerative arthrosis right knee and went to the operating room on 01/24/2016 and underwent the above named procedures.    Surgeries:Procedure(s): COMPUTER ASSISTED TOTAL KNEE ARTHROPLASTY on 01/24/2016  PRE-OPERATIVE DIAGNOSIS: Degenerative arthrosis of the right knee, primary  POST-OPERATIVE DIAGNOSIS:  Same  PROCEDURE:  Right total knee arthroplasty  SURGEON:  Marciano Sequin. M.D.  ASSISTANT:  Vance Peper, PA (present and scrubbed throughout the case, critical for assistance with exposure, retraction, instrumentation, and closure)  ANESTHESIA: spinal  ESTIMATED BLOOD LOSS: 50 mL  FLUIDS REPLACED: 1100 mL of crystalloid  TOURNIQUET TIME: 112 minutes  DRAINS: 2 medium drains to a reinfusion system  SOFT TISSUE RELEASES: Anterior cruciate ligament, posterior cruciate ligament, deep  and superficial medial collateral ligament, patellofemoral ligament  IMPLANTS UTILIZED: DePuy Attune size 4 posterior stabilized femoral component (cemented), size 5 rotating platform tibial component (cemented), 35 mm medialized dome patella (cemented), and a 8 mm stabilized rotating platform polyethylene insert.  INDICATIONS FOR SURGERY: [name] is a 76 y.o. year old female with a long history of progressive knee pain. X-rays demonstrated severe degenerative changes in tricompartmental fashion. The patient had not seen any significant improvement despite conservative nonsurgical intervention. After discussion of the risks and benefits of surgical intervention, the patient expressed understanding of the risks benefits and agree with plans for total knee arthroplasty.   The risks, benefits, and alternatives were discussed at length including but not limited to the risks of infection, bleeding, nerve injury, stiffness, blood clots, the need for revision surgery, cardiopulmonary complications, among others, and they were willing to proceed. Patient tolerated the surgery well. No complications .Patient was taken to PACU where she was stabilized and then transferred to the orthopedic floor.  Patient started on Lovenox 30 mg q 12 hrs. Foot pumps applied bilaterally at 80 mm hgb. Heels elevated off bed with rolled towels. No evidence of DVT. Calves non tender. Negative Homan. Physical therapy started on day #1 for gait training and transfer with OT starting on  day #1 for ADL and assisted devices. Patient has done well with therapy. Ambulated greater than 200 feet upon being discharged. Was able to ascend and descend 4 steps safely and independently  Patient's IV and Foley were discontinued on day #1 with the Hemovac being discontinued on day #2 along with dressing change prior to being discharged to home   She was given perioperative antibiotics:  Anti-infectives    Start     Dose/Rate Route  Frequency Ordered Stop   01/24/16 1915  ceFAZolin (ANCEF) IVPB 2g/100 mL premix     2 g 200 mL/hr over 30 Minutes Intravenous Every 6 hours 01/24/16 1905 01/25/16 1914   01/24/16 0736  ceFAZolin (ANCEF) 2-4 GM/100ML-% IVPB    Comments:  Ronnell Freshwater: cabinet override      01/24/16 0736 01/24/16 1944   01/24/16 0026  ceFAZolin (ANCEF) IVPB 2g/100 mL premix  Status:  Discontinued     2 g 200 mL/hr over 30 Minutes Intravenous On call to O.R. 01/24/16 0026 01/24/16 1058    .  She was fitted with AV 1 compression foot pump devices, instructed on heel pumps, early ambulation, and TED stockings bilaterally for DVT prophylaxis.  She benefited maximally from the hospital stay and there were no complications.    Recent vital signs:  Vitals:   01/24/16 2200 01/25/16 0333  BP: 115/66 (!) 111/44  Pulse: 60 (!) 55  Resp:  18  Temp: 98.5 F (36.9 C) 98.6 F (37 C)    Recent laboratory studies:  Lab Results  Component Value Date   HGB 11.7 (L) 01/25/2016   HGB 12.6 01/24/2016   HGB 13.2 01/10/2016   Lab Results  Component Value Date   WBC 5.7 01/25/2016   PLT 126 (L) 01/25/2016   Lab Results  Component Value Date   INR 1.02 01/10/2016   Lab Results  Component Value Date   NA 135 01/25/2016   K 4.0 01/25/2016   CL 102 01/25/2016   CO2 25 01/25/2016   BUN 16 01/25/2016   CREATININE 0.81 01/25/2016   GLUCOSE 108 (H) 01/25/2016    Discharge Medications:   Allergies as of 01/26/2016      Reactions   Aleve [naproxen Sodium] Hives   Ibuprofen Hives   Other Other (See Comments)   Nickel"ears ran fluid and itching"   Avapro  [irbesartan] Rash   Other reaction(s): Weal      Medication List    STOP taking these medications   aspirin EC 81 MG tablet     TAKE these medications   acetaminophen 500 MG tablet Commonly known as:  TYLENOL Take 1,000 mg by mouth every 6 (six) hours as needed for mild pain.   amLODipine 10 MG tablet Commonly known as:  NORVASC TAKE ONE  TABLET BY MOUTH ONCE DAILY FOR BLOOD PRESSURE   atenolol 100 MG tablet Commonly known as:  TENORMIN TAKE ONE TABLET BY MOUTH ONCE DAILY   CALTRATE 600+D PLUS PO Take 1 tablet by mouth daily.   enoxaparin 40 MG/0.4ML injection Commonly known as:  LOVENOX Inject 0.4 mLs (40 mg total) into the skin daily.   fluticasone 50 MCG/ACT nasal spray Commonly known as:  FLONASE Place into the nose.   GLUCOSAMINE-CHONDROITIN PO Take 2 tablets by mouth daily.   hydrochlorothiazide 25 MG tablet Commonly known as:  HYDRODIURIL TAKE ONE TABLET BY MOUTH ONCE DAILY   KLOR-CON M20 20 MEQ tablet Generic drug:  potassium chloride SA TAKE ONE TABLET BY MOUTH ONCE DAILY   MULTI-VITAMINS Tabs Take 1 tablet by mouth daily.   oxyCODONE 5 MG immediate release tablet Commonly known as:  Oxy IR/ROXICODONE Take 1-2 tablets (5-10 mg total) by mouth every 4 (four) hours as needed for severe pain or breakthrough pain.   simvastatin 10 MG tablet Commonly known as:  ZOCOR Take 10 mg by mouth daily at 6 PM.   [facility]  300 MG tablet Generic drug:  aliskiren TAKE ONE TABLET BY MOUTH ONCE DAILY   timolol 0.5 % ophthalmic solution Commonly known as:  TIMOPTIC Place 1 drop into both eyes daily.   traMADol 50 MG tablet Commonly known as:  ULTRAM Take 1-2 tablets (50-100 mg total) by mouth every 4 (four) hours as needed for moderate pain.            Durable Medical Equipment        Start     Ordered   01/24/16 1906  DME [place] rolling  Once    Question:  Patient needs a [place] to treat with the following condition  Answer:  Total knee replacement status   01/24/16 1905   01/24/16 1906  DME Bedside commode  Once    Question:  Patient needs a bedside commode to treat with the following condition  Answer:  Total knee replacement status   01/24/16 1905      Diagnostic Studies: Dg Knee Right Port  Result Date: 01/24/2016 CLINICAL DATA:  Postop right knee. EXAM: PORTABLE RIGHT KNEE - 1-2 VIEW  COMPARISON:  None. FINDINGS: A right total knee arthroplasty is noted. Joint space is adequate on the AP view. Two drains are in place. A moderate-sized effusion is as expected. Skin staples are in place. IMPRESSION: Right total knee arthroplasty without radiographic evidence for complication. Electronically Signed   By: San Morelle M.D.   On: 01/24/2016 17:46    Disposition: Final discharge disposition not confirmed    Follow-up Information    Temperance Kelemen R., PA On 02/08/2016.   Specialty:  Physician Assistant Why:  at 1:15pm Contact information: [place] [address] [phone]        Dereck Leep, MD On 03/07/2016.   Specialty:  Orthopedic Surgery Why:  at 9:30am Contact information: [place] [address] [phone]            Signed: Watt Climes. 01/25/2016, 7:08 AM

## 2016-01-25 NOTE — Progress Notes (Signed)
Physical Therapy Treatment Patient Details Name: [name] MRN: [id number] DOB: 08-23-1940 Today's Date: 01/25/2016    History of Present Illness admitted for acute hospitalization status post R TKR, 01/24/15, WBAT    PT Comments    Excellent progression in gait distance and overall functional performance.  Able to complete gait around nursing station with steady cadence, gait speed; no buckling or safety concerns noted.   Follow Up Recommendations  Home health PT     Equipment Recommendations   (youth RW)    Recommendations for Other Services       Precautions / Restrictions Precautions Precautions: Fall Restrictions Weight Bearing Restrictions: Yes Other Position/Activity Restrictions: RLE WBAT    Mobility  Bed Mobility Overal bed mobility: Modified Independent                Transfers Overall transfer level: Needs assistance Equipment used: Rolling Wojtas (2 wheeled) Transfers: Sit to/from Stand Sit to Stand: Supervision         General transfer comment: cuing for hand placement to maximize safety with movement transition  Ambulation/Gait Ambulation/Gait assistance: Supervision Ambulation Distance (Feet): 200 Feet Assistive device: Rolling Pyper (2 wheeled)       General Gait Details: reciprocal stepping pattern with mild decrease in R LE stance time; decreased cadence, but steady and with good safety/stability   Stairs            Wheelchair Mobility    Modified Rankin (Stroke Patients Only)       Balance Overall balance assessment: Needs assistance Sitting-balance support: No upper extremity supported;Feet supported Sitting balance-Leahy Scale: Normal     Standing balance support: Bilateral upper extremity supported Standing balance-Leahy Scale: Good                      Cognition Arousal/Alertness: Awake/alert Behavior During Therapy: WFL for tasks assessed/performed Overall Cognitive Status: Within Functional  Limits for tasks assessed                      Exercises Total Joint Exercises Goniometric ROM: 6-80 degrees, act assist ROM Other Exercises Other Exercises: Toilet transfer, ambulatory with RW, cga/close sup; sit/stand with RW from standard toilet, cga/close sup; standing balance with RW for hygiene, clothing management and hand hygiene, close sup.  Standing functional reach at sink for hand hygiene, approx 5-6" without LOB; good stability and insight into limits of stability.    General Comments        Pertinent Vitals/Pain Pain Assessment: 0-10 Pain Score: 1  Pain Location: R knee Pain Descriptors / Indicators: Aching Pain Intervention(s): Limited activity within patient's tolerance;Monitored during session;Repositioned;Premedicated before session    Home Living Family/patient expects to be discharged to:: Private residence Living Arrangements: Spouse/significant other Available Help at Discharge: Family;Available 24 hours/day Type of Home: House Home Access: Stairs to enter Entrance Stairs-Rails: Left Home Layout: Able to live on main level with bedroom/bathroom;Two level Home Equipment: Clinical cytogeneticist - standard;Adaptive equipment      Prior Function Level of Independence: Independent      Comments: Indep with ADLs, household and community mobility without assist device   PT Goals (current goals can now be found in the care plan section) Acute Rehab PT Goals Patient Stated Goal: to return home PT Goal Formulation: With patient/family Time For Goal Achievement: 02/08/16 Potential to Achieve Goals: Good Progress towards PT goals: Progressing toward goals    Frequency    BID      PT  Plan Current plan remains appropriate    Co-evaluation             End of Session Equipment Utilized During Treatment: Gait belt Activity Tolerance: Patient tolerated treatment well Patient left: in bed;with call bell/phone within reach;with bed alarm set;with  family/visitor present     Time: 3244-0102 PT Time Calculation (min) (ACUTE ONLY): 14 min  Charges:  $Gait Training: 8-22 mins $Therapeutic Exercise: 8-22 mins                    G Codes:       Ahriyah Vannest H. Owens Shark, PT, DPT, NCS 01/25/16, 3:06 PM [phone]

## 2016-01-25 NOTE — Care Management Note (Signed)
Case Management Note  Patient Details  Name: [name] MRN: [id number] Date of Birth: [birth year]  Subjective/Objective:  POD # 1 left TKA. Met with patient at bedside. She is sitting up in chair. She lives at home with her spouse. PT recommending home with home health. [facility] prefers [facility]. Referral called to [facility]. Ordered [facility] from [facility]. Pharmacy: [facility] [phone]. Called Lovenox 40 mg # 14 no refills. PCP is Dr. Baldemar Lenis.               Action/Plan: [facility] for [name], [facility] for HHPT. Lovenox called in.   Expected Discharge Date:                  Expected Discharge Plan:  [place]  In-House Referral:     Discharge planning Services  CM Consult  Post Acute Care Choice:  Durable Medical Equipment, Home Health Choice offered to:  Patient  DME Arranged:  [place] rolling DME Agency:  [place]:  PT [place]:  [facility] (now [facility])  Status of Service:  In process, will continue to follow  If discussed at Long Length of Stay Meetings, dates discussed:    Additional Comments:  Jolly Mango, RN 01/25/2016, 2:01 PM

## 2016-01-25 NOTE — Evaluation (Signed)
Occupational Therapy Evaluation Patient Details Name: [name] MRN: [id number] DOB: 1940-11-03 Today's Date: 01/25/2016    History of Present Illness 76yo female pt s/p R TKA POD#1 with PMH of HTN, PVD, OA, arthritis, breast cancer (LUE radiation), and personal history of radiation therapy.   Clinical Impression   76yo female s/p R TKA POD#1, presents with muscle weakness, decreased activity tolerance and ROM and decreased knowledge of AE/DME which impact pt's functional independence with ADL/IADL. Pt will benefit from skilled OT services to address noted impairments and functional deficits in order to maximize functional independence with ADL/IADL and minimize risk of falls. Recommend HHOT.    Follow Up Recommendations  Home health OT    Equipment Recommendations  Other (comment) (grab bars in bathroom)    Recommendations for Other Services       Precautions / Restrictions Precautions Precautions: Fall Restrictions Weight Bearing Restrictions: Yes Other Position/Activity Restrictions: RLE WBAT      Mobility Bed Mobility Overal bed mobility: Independent                Transfers Overall transfer level: Needs assistance Equipment used: None Transfers: Sit to/from Stand Sit to Stand: Min guard              Balance Overall balance assessment: Needs assistance   Sitting balance-Leahy Scale: Normal     Standing balance support: Single extremity supported Standing balance-Leahy Scale: Good                              ADL Overall ADL's : Needs assistance/impaired Eating/Feeding: Set up;Sitting   Grooming: Sitting;Set up   Upper Body Bathing: Set up;Sitting   Lower Body Bathing: Min guard;Sit to/from stand;Set up   Upper Body Dressing : Set up;Sitting   Lower Body Dressing: Minimal assistance;With adaptive equipment;Cueing for compensatory techniques;Sit to/from stand;Set up   Toilet Transfer: Min Art therapist  Details (indicate cue type and reason): Pt transferred EOB to [facility] with min guard and verbal cues for hand placement to improve safe technique with slight LOB noted due to poor hand placement         Functional mobility during ADLs: Min guard General ADL Comments: pt requires minimal assistance for LB ADL and cuing for compensatory strategies using AE     Vision Vision Assessment?: No apparent visual deficits   Perception     Praxis      Pertinent Vitals/Pain Pain Assessment: No/denies pain (pt reports receiving pain medication approx 1 hour prior to OT session)     Hand Dominance Right   Extremity/Trunk Assessment Upper Extremity Assessment Upper Extremity Assessment: Overall WFL for tasks assessed   Lower Extremity Assessment Lower Extremity Assessment: Defer to PT evaluation   Cervical / Trunk Assessment Cervical / Trunk Assessment: Normal   Communication Communication Communication: No difficulties   Cognition Arousal/Alertness: Awake/alert Behavior During Therapy: WFL for tasks assessed/performed Overall Cognitive Status: Within Functional Limits for tasks assessed                     General Comments       Exercises       Shoulder Instructions      Home Living Family/patient expects to be discharged to:: Private residence Living Arrangements: Spouse/significant other Available Help at Discharge: Family;Available 24 hours/day Type of Home: House Home Access: Stairs to enter [employer] of Steps: 3-5 depending on front or back entrance Entrance  Stairs-Rails: Left Home Layout: Able to live on main level with bedroom/bathroom;Two level     Bathroom Shower/Tub: Walk-in shower;Door   [employer] Toilet: Handicapped height Bathroom Accessibility: Yes How Accessible: Accessible via [place] Home Equipment: Shower seat;[place] - standard;Adaptive equipment Adaptive Equipment: Reacher        Prior Functioning/Environment Level of  Independence: Independent        Comments: Indep with all ADL/IADL at baseline        OT Problem List: Decreased strength;Decreased range of motion;Decreased knowledge of use of DME or AE;Decreased activity tolerance   OT Treatment/Interventions: Self-care/ADL training;Therapeutic exercise;Therapeutic activities;Energy conservation;Patient/family education;DME and/or AE instruction    OT Goals(Current goals can be found in the care plan section) Acute Rehab OT Goals Patient Stated Goal: go home OT Goal Formulation: With patient/family Time For Goal Achievement: 02/08/16 Potential to Achieve Goals: Good  OT Frequency: Min 1X/week   Barriers to D/C:            Co-evaluation              End of Session    Activity Tolerance: Patient tolerated treatment well Patient left: in bed;with call bell/phone within reach;with family/visitor present;with SCD's reapplied   Time: 1008-1040 OT Time Calculation (min): 32 min Charges:  OT General Charges $OT Visit: 1 Procedure OT Evaluation $OT Eval Low Complexity: 1 Procedure OT Treatments $Self Care/Home Management : 8-22 mins G-Codes:    Corky Sox, OTR/L 01/25/2016, 11:02 AM

## 2016-01-25 NOTE — Progress Notes (Signed)
Clinical Education officer, museum (CSW) received SNF consult. PT is recommending home health. RN case manager is aware of above. Please reconsult if future social work needs arise. CSW signing off.   [employer], LCSW [phone]

## 2016-01-25 NOTE — Discharge Instructions (Signed)
° °TOTAL KNEE REPLACEMENT POSTOPERATIVE DIRECTIONS ° °Knee Rehabilitation, Guidelines Following Surgery  °Results after knee surgery are often greatly improved when you follow the exercise, range of motion and muscle strengthening exercises prescribed by your doctor. Safety measures are also important to protect the knee from further injury. Any time any of these exercises cause you to have increased pain or swelling in your knee joint, decrease the amount until you are comfortable again and slowly increase them. If you have problems or questions, call your caregiver or physical therapist for advice.  ° °HOME CARE INSTRUCTIONS  °Remove items at home which could result in a fall. This includes throw rugs or furniture in walking pathways.  °· Continue to use polar care unit on the knee for pain and swelling from surgery. You may notice swelling that will progress down to the foot and ankle.  This is normal after surgery.  Elevate the leg when you are not up walking on it.   °· Continue to use the breathing machine which will help keep your temperature down.  It is common for your temperature to cycle up and down following surgery, especially at night when you are not up moving around and exerting yourself.  The breathing machine keeps your lungs expanded and your temperature down. °· Do not place pillow under knee, focus on keeping the knee STRAIGHT while resting ° °DIET °You may resume your previous home diet once your are discharged from the hospital. ° °DRESSING / WOUND CARE / SHOWERING °You may start showering once staples have been removed. Change the surgical dressing as needed. ° °STAPLE REMOVAL: °Staples are to be removed at two weeks post op and apply benzoin and 1/2 inch steri strips ° °ACTIVITY °Walk with your [place]k as instructed. °Use Biffle as long as suggested by your caregivers. °Avoid periods of inactivity such as sitting longer than an hour when not asleep. This helps prevent blood clots.  °You may  resume a sexual relationship in one month or when given the OK by your doctor.  °You may return to work once you are cleared by your doctor.  °Do not drive a car for 6 weeks or until released by you surgeon.  °Do not drive while taking narcotics. ° °WEIGHT BEARING °Weight bearing as tolerated with assist device (Wrobel, cane, etc) as directed, use it as long as suggested by your surgeon or therapist, typically at least 4-6 weeks. ° °POSTOPERATIVE CONSTIPATION PROTOCOL °Constipation - defined medically as fewer than three stools per week and severe constipation as less than one stool per week. ° °One of the most common issues patients have following surgery is constipation.  Even if you have a regular bowel pattern at home, your normal regimen is likely to be disrupted due to multiple reasons following surgery.  Combination of anesthesia, postoperative narcotics, change in appetite and fluid intake all can affect your bowels.  In order to avoid complications following surgery, here are some recommendations in order to help you during your recovery period. ° °Colace (docusate) - Pick up an over-the-counter form of Colace or another stool softener and take twice a day as long as you are requiring postoperative pain medications.  Take with a full glass of water daily.  If you experience loose stools or diarrhea, hold the colace until you stool forms back up.  If your symptoms do not get better within 1 week or if they get worse, check with your doctor. ° °Dulcolax (bisacodyl) - Pick up over-the-counter and   take as directed by the product packaging as needed to assist with the movement of your bowels.  Take with a full glass of water.  Use this product as needed if not relieved by Colace only.  ° °MiraLax (polyethylene glycol) - Pick up over-the-counter to have on hand.  MiraLax is a solution that will increase the amount of water in your bowels to assist with bowel movements.  Take as directed and can mix with a glass  of water, juice, soda, coffee, or tea.  Take if you go more than two days without a movement. °Do not use MiraLax more than once per day. Call your doctor if you are still constipated or irregular after using this medication for 7 days in a row. ° °If you continue to have problems with postoperative constipation, please contact the office for further assistance and recommendations.  If you experience "the worst abdominal pain ever" or develop nausea or vomiting, please contact the office immediatly for further recommendations for treatment. ° °ITCHING ° If you experience itching with your medications, try taking only a single pain pill, or even half a pain pill at a time.  You can also use Benadryl over the counter for itching or also to help with sleep.  ° °TED HOSE STOCKINGS °Wear the elastic stockings on both legs for  6 weeks following surgery during the day but you may remove then at night for sleeping. ° °MEDICATIONS °See your medication summary on the “After Visit Summary” that the nursing staff will review with you prior to discharge.  You may have some home medications which will be placed on hold until you complete the course of blood thinner medication.  It is important for you to complete the blood thinner medication as prescribed by your surgeon.  Continue your approved medications as instructed at time of discharge. ° °PRECAUTIONS °If you experience chest pain or shortness of breath - call 911 immediately for transfer to the hospital emergency department.  °If you develop a fever greater that 101 F, purulent drainage from wound, increased redness or drainage from wound, foul odor from the wound/dressing, or calf pain - CONTACT YOUR SURGEON.   °                                                °FOLLOW-UP APPOINTMENTS °Make sure you keep all of your appointments after your operation with your surgeon and caregivers. You should call the office at the above phone number and make an appointment for  approximately two weeks after the date of your surgery or on the date instructed by your surgeon outlined in the "After Visit Summary". ° ° °RANGE OF MOTION AND STRENGTHENING EXERCISES  °Rehabilitation of the knee is important following a knee injury or an operation. After just a few days of immobilization, the muscles of the thigh which control the knee become weakened and shrink (atrophy). Knee exercises are designed to build up the tone and strength of the thigh muscles and to improve knee motion. Often times heat used for twenty to thirty minutes before working out will loosen up your tissues and help with improving the range of motion but do not use heat for the first two weeks following surgery. These exercises can be done on a training (exercise) mat, on the floor, on a table or on a bed. Use what   ever works the best and is most comfortable for you Knee exercises include:  °Leg Lifts - While your knee is still immobilized in a splint or cast, you can do straight leg raises. Lift the leg to 60 degrees, hold for 3 sec, and slowly lower the leg. Repeat 10-20 times 2-3 times daily. Perform this exercise against resistance later as your knee gets better.  °Quad and Hamstring Sets - Tighten up the muscle on the front of the thigh (Quad) and hold for 5-10 sec. Repeat this 10-20 times hourly. Hamstring sets are done by pushing the foot backward against an object and holding for 5-10 sec. Repeat as with quad sets.  °· Leg Slides: Lying on your back, slowly slide your foot toward your buttocks, bending your knee up off the floor (only go as far as is comfortable). Then slowly slide your foot back down until your leg is flat on the floor again. °· Angel Wings: Lying on your back spread your legs to the side as far apart as you can without causing discomfort.  °A rehabilitation program following serious knee injuries can speed recovery and prevent re-injury in the future due to weakened muscles. Contact your doctor or a  physical therapist for more information on knee rehabilitation.  ° °IF YOU ARE TRANSFERRED TO A SKILLED REHAB FACILITY °If the patient is transferred to a skilled rehab facility following release from the hospital, a list of the current medications will be sent to the facility for the patient to continue.  When discharged from the skilled rehab facility, please have the facility set up the patient's Home Health Physical Therapy prior to being released. Also, the skilled facility will be responsible for providing the patient with their medications at time of release from the facility to include their pain medication, the muscle relaxants, and their blood thinner medication. If the patient is still at the rehab facility at time of the two week follow up appointment, the skilled rehab facility will also need to assist the patient in arranging follow up appointment in our office and any transportation needs. ° °MAKE SURE YOU:  °Understand these instructions.  °Get help right away if you are not doing well or get worse.  ° ° ° ° °

## 2016-01-25 NOTE — Progress Notes (Signed)
Patient dangled to the side of the bed this evening. Tolerated well. Minimal pain and discomfort. Pain medication given once. Family at bedside.

## 2016-01-26 LAB — CBC
HEMATOCRIT: 29.6 % — AB (ref 35.0–47.0)
HEMOGLOBIN: 10.6 g/dL — AB (ref 12.0–16.0)
MCH: 36.2 pg — AB (ref 26.0–34.0)
MCHC: 35.9 g/dL (ref 32.0–36.0)
MCV: 100.8 fL — ABNORMAL HIGH (ref 80.0–100.0)
Platelets: 111 10*3/uL — ABNORMAL LOW (ref 150–440)
RBC: 2.93 MIL/uL — AB (ref 3.80–5.20)
RDW: 12.8 % (ref 11.5–14.5)
WBC: 5.7 10*3/uL (ref 3.6–11.0)

## 2016-01-26 LAB — BASIC METABOLIC PANEL
Anion gap: 6 (ref 5–15)
BUN: 14 mg/dL (ref 6–20)
CHLORIDE: 105 mmol/L (ref 101–111)
CO2: 27 mmol/L (ref 22–32)
CREATININE: 0.78 mg/dL (ref 0.44–1.00)
Calcium: 8.5 mg/dL — ABNORMAL LOW (ref 8.9–10.3)
GFR calc non Af Amer: >60 mL/min (ref >60)
Glucose, Bld: 111 mg/dL — ABNORMAL HIGH (ref 65–99)
POTASSIUM: 3.4 mmol/L — AB (ref 3.5–5.1)
SODIUM: 138 mmol/L (ref 135–145)

## 2016-01-26 MED ORDER — OXYCODONE HCL 5 MG PO TABS: 5.0000 mg | ORAL_TABLET | ORAL | 0 refills | Status: DC | PRN

## 2016-01-26 MED ORDER — ENOXAPARIN SODIUM 40 MG/0.4ML ~~LOC~~ SOLN: 40.0000 mg | SUBCUTANEOUS | 0 refills | Status: DC

## 2016-01-26 MED ORDER — TRAMADOL HCL 50 MG PO TABS: 50.0000 mg | ORAL_TABLET | ORAL | 0 refills | Status: DC | PRN

## 2016-01-26 NOTE — Progress Notes (Signed)
Patient was discharged home with family. IVs removed with caths intact. Scripts given to husband to fill. Reviewed discharge instruction which includes diet, activity, dressing, exercises, and pain meds. Reviewed last dose of meds given. Allowed time for questions.

## 2016-01-26 NOTE — Progress Notes (Signed)
Physical Therapy Treatment Patient Details Name: [name] MRN: [id number] DOB: [birth year] Today's Date: 01/26/2016    History of Present Illness admitted for acute hospitalization status post R TKR, 01/24/15, WBAT    PT Comments    Pt willing to participate in PT treatment. She demonstrated understanding of HEP and performed sitting therex under PT supervision. Pt provided with exercise packet and educated on duration and frequency of HEP. She can safely transfer w/ supervision and ambulate with min guarding using RW . She presented with a step through gait pattern, trunk was slightly flexed during ambulation but corrected with verbal cues. Pt able to safely ascend and descend 4 steps following demonstration by PT . She also ambulated with [place]ancher too far forward but corrected with verbal cues and demonstrated safe utilization of RW. She used R railing and min guarding from PT for stair ambulation. Pt appropriate to return home for basic household ambulation, still demonstrates decreased strength, ROM and balance deficits. Will continue to benefit from skilled PT to correct deficits above ;  Recommend HHPT.   Follow Up Recommendations  Home health PT     Equipment Recommendations       Recommendations for Other Services       Precautions / Restrictions Restrictions Weight Bearing Restrictions: Yes    Mobility  Bed Mobility               General bed mobility comments: Did not assess, pt in chair when beginning therapy  Transfers Overall transfer level: Needs assistance Equipment used: Rolling Santana (2 wheeled) Transfers: Sit to/from Stand Sit to Stand: Supervision         General transfer comment: Demonstrated safe hand placement, R LE slightly anterior, able to transfer to standing safely  Ambulation/Gait Ambulation/Gait assistance: Min guard Ambulation Distance (Feet): 200 Feet Assistive device: Rolling Tavella (2 wheeled) Gait Pattern/deviations:  Step-through pattern;Trunk flexed     General Gait Details: reciprocal stepping pattern wi. good stability and safety, pt able to correct flexed trunk with verbal cues   Stairs     Stair Management: One rail Right Number of Stairs: 4 General stair comments: demonstrated good hand placement on railing, safely able to ambulate with verbal cues and PT demonstration  Wheelchair Mobility    Modified Rankin (Stroke Patients Only)       Balance Overall balance assessment: Needs assistance Sitting-balance support: No upper extremity supported Sitting balance-Leahy Scale: Normal     Standing balance support: Bilateral upper extremity supported Standing balance-Leahy Scale: Good Standing balance comment: Able to maintian upright posture with RW                     Cognition Arousal/Alertness: Awake/alert Behavior During Therapy: WFL for tasks assessed/performed Overall Cognitive Status: Within Functional Limits for tasks assessed                      Exercises Total Joint Exercises Goniometric ROM: 4-86 degrees, act assist ROM Other Exercises Other Exercises: Sitting therex - AROM 1x12, Ankle pumps, quad sets, SAQs, hip abd, heel slides, to improve ROM and functional strength, exercises performed under PT superivision with verbal cues    General Comments        Pertinent Vitals/Pain Pain Assessment: No/denies pain Pain Intervention(s): Monitored during session    Home Living                      Prior Function  PT Goals (current goals can now be found in the care plan section) Acute Rehab PT Goals Patient Stated Goal: to return home PT Goal Formulation: With patient Time For Goal Achievement: 02/08/16 Potential to Achieve Goals: Good Progress towards PT goals: Progressing toward goals    Frequency    BID      PT Plan Current plan remains appropriate    Co-evaluation             End of Session Equipment Utilized  During Treatment: Gait belt Activity Tolerance: Patient tolerated treatment well Patient left: in chair;with call bell/phone within reach;with chair alarm set     Time: 0911-0940 PT Time Calculation (min) (ACUTE ONLY): 29 min  Charges:                       G Codes:      Masashi Snowdon 2016-02-09, 10:57 AM

## 2016-01-26 NOTE — Care Management Note (Signed)
Case Management Note  Patient Details  Name: [name] MRN: [id number] Date of Birth: [birth year]  Subjective/Objective:    Cost of Lovenox is $111.77. Patient agreeable.                 Action/Plan: [facility] notified of discharge. [place] delivered  Expected Discharge Date:  01/26/16               Expected Discharge Plan:  [place]  In-House Referral:     Discharge planning Services  CM Consult  Post Acute Care Choice:  Durable Medical Equipment, Home Health Choice offered to:  Patient  DME Arranged:  [place] rolling DME Agency:  [place]:  PT [place]:  [facility] (now [facility])  Status of Service:  Completed, signed off  If discussed at [employer] of Stay Meetings, dates discussed:    Additional Comments:  Jolly Mango, RN 01/26/2016, 9:13 AM

## 2016-01-26 NOTE — Progress Notes (Signed)
   Subjective: 2 Days Post-Op Procedure(s) (LRB): COMPUTER ASSISTED TOTAL KNEE ARTHROPLASTY (Right) Patient reports pain as mild.   Patient is well, and has had no acute complaints or problems Continue with physical therapy today.  Plan is to go Home after hospital stay. no nausea and no vomiting Patient denies any chest pains or shortness of breath. Objective: Vital signs in last 24 hours: Temp:  [97.8 F (36.6 C)-98.9 F (37.2 C)] 98.4 F (36.9 C) (01/12 0441) Pulse Rate:  [60-83] 72 (01/12 0441) Resp:  [18] 18 (01/12 0441) BP: (117-147)/(41-48) 117/48 (01/12 0441) SpO2:  [95 %-97 %] 97 % (01/12 0441) well approximated incision Heels are non tender and elevated off the bed using rolled towels Intake/Output from previous day: 01/11 0701 - 01/12 0700 In: 480 [P.O.:480] Out: 150 [Drains:150] Intake/Output this shift: No intake/output data recorded.   Recent Labs  01/24/16 1136 01/25/16 0420 01/26/16 0418  HGB 12.6 11.7* 10.6*    Recent Labs  01/25/16 0420 01/26/16 0418  WBC 5.7 5.7  RBC 3.29* 2.93*  HCT 33.1* 29.6*  PLT 126* 111*    Recent Labs  01/25/16 0420 01/26/16 0418  NA 135 138  K 4.0 3.4*  CL 102 105  CO2 25 27  BUN 16 14  CREATININE 0.81 0.78  GLUCOSE 108* 111*  CALCIUM 8.4* 8.5*   No results for input(s): LABPT, INR in the last 72 hours.  EXAM General - Patient is Alert, Appropriate and Oriented Extremity - Neurologically intact Neurovascular intact Sensation intact distally Intact pulses distally Dorsiflexion/Plantar flexion intact No cellulitis present Compartment soft Dressing - moderate drainage to the distal dressing. Motor Function - intact, moving foot and toes well on exam.    Past Medical History:  Diagnosis Date  . Arthritis   . Breast cancer ([place]) 2011   radiation- Left  . Hypertension   . Personal history of radiation therapy   . PVD (peripheral vascular disease) ([facility])     Assessment/Plan: 2 Days Post-Op  Procedure(s) (LRB): COMPUTER ASSISTED TOTAL KNEE ARTHROPLASTY (Right) Active Problems:   S/P total knee arthroplasty  Estimated body mass index is 28.12 kg/m as calculated from the following:   Height as of this encounter: 5' (1.524 m).   Weight as of this encounter: 65.3 kg (144 lb). Up with therapy Discharge home with home health  Labs: Were reviewed DVT Prophylaxis - Lovenox, Foot Pumps and TED hose Weight-Bearing as tolerated to right leg Hemovac was discontinued on today's visit. Dressing changed today Please wash right leg and apply TED stockings Please change dressing prior to patient being discharged and give the patient 2 extreme honeycomb dressings to take home.  Jillyn Ledger. [place] 01/26/2016, 7:41 AM

## 2016-02-15 ENCOUNTER — Encounter (INDEPENDENT_AMBULATORY_CARE_PROVIDER_SITE_OTHER): Payer: Medicare Other

## 2016-02-15 ENCOUNTER — Ambulatory Visit (INDEPENDENT_AMBULATORY_CARE_PROVIDER_SITE_OTHER): Payer: Medicare Other | Admitting: Vascular Surgery

## 2016-04-04 ENCOUNTER — Ambulatory Visit (INDEPENDENT_AMBULATORY_CARE_PROVIDER_SITE_OTHER): Payer: Medicare Other | Admitting: Vascular Surgery

## 2016-04-04 ENCOUNTER — Encounter (INDEPENDENT_AMBULATORY_CARE_PROVIDER_SITE_OTHER): Payer: Medicare Other

## 2016-05-30 ENCOUNTER — Encounter (INDEPENDENT_AMBULATORY_CARE_PROVIDER_SITE_OTHER): Payer: Self-pay | Admitting: Vascular Surgery

## 2016-05-30 ENCOUNTER — Other Ambulatory Visit (INDEPENDENT_AMBULATORY_CARE_PROVIDER_SITE_OTHER): Payer: Self-pay | Admitting: Vascular Surgery

## 2016-05-30 ENCOUNTER — Other Ambulatory Visit (INDEPENDENT_AMBULATORY_CARE_PROVIDER_SITE_OTHER): Payer: Medicare Other

## 2016-05-30 ENCOUNTER — Ambulatory Visit (INDEPENDENT_AMBULATORY_CARE_PROVIDER_SITE_OTHER): Payer: Medicare Other | Admitting: Vascular Surgery

## 2016-05-30 VITALS — BP 173/76 | HR 66 | Resp 16 | Wt 145.0 lb

## 2016-05-30 DIAGNOSIS — I739 Peripheral vascular disease, unspecified: Secondary | ICD-10-CM

## 2016-05-30 DIAGNOSIS — I10 Essential (primary) hypertension: Secondary | ICD-10-CM

## 2016-05-30 DIAGNOSIS — M170 Bilateral primary osteoarthritis of knee: Secondary | ICD-10-CM

## 2016-05-30 DIAGNOSIS — E782 Mixed hyperlipidemia: Secondary | ICD-10-CM | POA: Diagnosis not present

## 2016-05-30 DIAGNOSIS — I70219 Atherosclerosis of native arteries of extremities with intermittent claudication, unspecified extremity: Secondary | ICD-10-CM | POA: Diagnosis not present

## 2016-06-02 NOTE — Progress Notes (Signed)
MRN : [id number]  [name] is a 76 y.o. (1940/08/03) female who presents with chief complaint of  Chief Complaint  Patient presents with  . Follow-up  .  History of Present Illness: The patient returns to the office for followup and review of the noninvasive studies. There have been no interval changes in lower extremity symptoms. No interval shortening of the patient's claudication distance or development of rest pain symptoms. No new ulcers or wounds have occurred since the last visit.  There have been no significant changes to the patient's overall health care.  The patient denies amaurosis fugax or recent TIA symptoms. There are no recent neurological changes noted. The patient denies history of DVT, PE or superficial thrombophlebitis. The patient denies recent episodes of angina or shortness of breath.     Current Meds  Medication Sig  . acetaminophen (TYLENOL) 500 MG tablet Take 1,000 mg by mouth every 6 (six) hours as needed for mild pain.  Marland Kitchen aliskiren (TEKTURNA) 300 MG tablet TAKE ONE TABLET BY MOUTH ONCE DAILY  . amLODipine (NORVASC) 10 MG tablet TAKE ONE TABLET BY MOUTH ONCE DAILY FOR BLOOD PRESSURE  . atenolol (TENORMIN) 100 MG tablet TAKE ONE TABLET BY MOUTH ONCE DAILY  . Calcium Carbonate-Vit D-Min (CALTRATE 600+D PLUS PO) Take 1 tablet by mouth daily.   . hydrochlorothiazide (HYDRODIURIL) 25 MG tablet TAKE ONE TABLET BY MOUTH ONCE DAILY  . Multiple Vitamin (MULTI-VITAMINS) TABS Take 1 tablet by mouth daily.   . potassium chloride SA (KLOR-CON M20) 20 MEQ tablet TAKE ONE TABLET BY MOUTH ONCE DAILY  . simvastatin (ZOCOR) 10 MG tablet Take 10 mg by mouth daily at 6 PM.   . timolol (TIMOPTIC) 0.5 % ophthalmic solution Place 1 drop into both eyes daily.     Past Medical History:  Diagnosis Date  . Arthritis   . Breast cancer ([place]) 2011   radiation- Left  . Hypertension   . Personal history of radiation therapy   . PVD (peripheral vascular disease) ([place])      Past Surgical History:  Procedure Laterality Date  . ABDOMINAL HYSTERECTOMY    . BREAST EXCISIONAL BIOPSY Left 12/21/2009   positive, radiation  . CATARACT EXTRACTION W/ INTRAOCULAR LENS IMPLANT & ANTERIOR VITRECTOMY, BILATERAL    . CHOLECYSTECTOMY    . KNEE ARTHROPLASTY Right 01/24/2016   Procedure: COMPUTER ASSISTED TOTAL KNEE ARTHROPLASTY;  Surgeon: Dereck Leep, MD;  Location: [facility] ORS;  Service: Orthopedics;  Laterality: Right;  . stent in Lt leg Left     Social History Social History  Substance Use Topics  . Smoking status: Former Research scientist (life sciences)  . Smokeless tobacco: Never Used  . Alcohol use No    Family History No family history on file.  Allergies  Allergen Reactions  . Ibuprofen Hives  . Aleve [Naproxen Sodium] Hives  . Avapro  [Irbesartan] Rash    Other reaction(s): Weal  . Other Other (See Comments)    Nickel"ears ran fluid and itching" redness and "sensitive" Nickel"ears ran fluid and itching"     REVIEW OF SYSTEMS (Negative unless checked)  Constitutional: '[]'$ Weight loss  '[]'$ Fever  '[]'$ Chills Cardiac: '[]'$ Chest pain   '[]'$ Chest pressure   '[]'$ Palpitations   '[]'$ Shortness of breath when laying flat   '[]'$ Shortness of breath with exertion. Vascular:  '[x]'$ Pain in legs with walking   '[]'$ Pain in legs at rest  '[]'$ History of DVT   '[]'$ Phlebitis   '[]'$ Swelling in legs   '[]'$ Varicose veins   '[]'$ Non-healing ulcers Pulmonary:   '[]'$   Uses home oxygen   '[]'$ Productive cough   '[]'$ Hemoptysis   '[]'$ Wheeze  '[]'$ COPD   '[]'$ Asthma Neurologic:  '[]'$ Dizziness   '[]'$ Seizures   '[]'$ History of stroke   '[]'$ History of TIA  '[]'$ Aphasia   '[]'$ Vissual changes   '[]'$ Weakness or numbness in arm   '[]'$ Weakness or numbness in leg Musculoskeletal:   '[]'$ Joint swelling   '[x]'$ Joint pain   '[]'$ Low back pain Hematologic:  '[]'$ Easy bruising  '[]'$ Easy bleeding   '[]'$ Hypercoagulable state   '[]'$ Anemic Gastrointestinal:  '[]'$ Diarrhea   '[]'$ Vomiting  '[]'$ Gastroesophageal reflux/heartburn   '[]'$ Difficulty swallowing. Genitourinary:  '[]'$ Chronic kidney disease   '[]'$ Difficult  urination  '[]'$ Frequent urination   '[]'$ Blood in urine Skin:  '[]'$ Rashes   '[]'$ Ulcers  Psychological:  '[]'$ History of anxiety   '[]'$  History of major depression.  Physical Examination  Vitals:   05/30/16 0920  BP: (!) 173/76  Pulse: 66  Resp: 16  Weight: 145 lb (65.8 kg)   Body mass index is 28.32 kg/m. Gen: WD/WN, NAD Head: /AT, No temporalis wasting.  Ear/Nose/Throat: Hearing grossly intact, nares w/o erythema or drainage Eyes: PER, EOMI, sclera nonicteric.  Neck: Supple, no large masses.   Pulmonary:  Good air movement, no audible wheezing bilaterally, no use of accessory muscles.  Cardiac: RRR, no JVD Vascular:  Vessel Right Left  Radial Palpable Palpable  Ulnar Palpable Palpable  Brachial Palpable Palpable  Carotid Palpable Palpable  Femoral Palpable Palpable  Popliteal Not Palpable Not Palpable  PT Trace Palpable Not Palpable  DP Not alpable Trace Palpable  Gastrointestinal: Non-distended. No guarding/no peritoneal signs.  Musculoskeletal: M/S 5/5 throughout.  Degenerative changes noted multiple joints with well healed right TKR scar.  Neurologic: CN 2-12 intact. Symmetrical.  Speech is fluent. Motor exam as listed above. Psychiatric: Judgment intact, Mood & affect appropriate for pt's clinical situation. Dermatologic: No rashes or ulcers noted.  No changes consistent with cellulitis. Lymph : No lichenification or skin changes of chronic lymphedema.  CBC Lab Results  Component Value Date   WBC 5.7 01/26/2016   HGB 10.6 (L) 01/26/2016   HCT 29.6 (L) 01/26/2016   MCV 100.8 (H) 01/26/2016   PLT 111 (L) 01/26/2016    BMET    Component Value Date/Time   NA 138 01/26/2016 0418   K 3.4 (L) 01/26/2016 0418   CL 105 01/26/2016 0418   CO2 27 01/26/2016 0418   GLUCOSE 111 (H) 01/26/2016 0418   BUN 14 01/26/2016 0418   CREATININE 0.78 01/26/2016 0418   CALCIUM 8.5 (L) 01/26/2016 0418   GFRNONAA >60 01/26/2016 0418   GFRAA >60 01/26/2016 0418   CrCl cannot be calculated  (Patient's most recent lab result is older than the maximum 21 days allowed.).  COAG Lab Results  Component Value Date   INR 1.02 01/10/2016    Radiology No results found.  Assessment/Plan 1. Peripheral vascular disease ([place])  Recommend:  The patient has evidence of atherosclerosis of the lower extremities with claudication.  The patient does not voice lifestyle limiting changes at this point in time.  Noninvasive studies do not suggest clinically significant change.  No invasive studies, angiography or surgery at this time The patient should continue walking and begin a more formal exercise program.  The patient should continue antiplatelet therapy and aggressive treatment of the lipid abnormalities  No changes in the patient's medications at this time  The patient should continue wearing graduated compression socks 10-15 mmHg strength to control the mild edema.   - VAS [place] LOWER EXTREMITY ARTERIAL DUPLEX; Future - VAS  [place] ABI WITH/WO TBI; Future  2. Essential hypertension Continue antihypertensive medications as already ordered, these medications have been reviewed and there are no changes at this time.   3. Primary osteoarthritis of both knees Continue NSAID medications as already ordered, these medications have been reviewed and there are no changes at this time.   4. Mixed hyperlipidemia Continue statin as ordered and reviewed, no changes at this time     Hortencia Pilar, MD  06/02/2016 5:46 PM

## 2016-07-10 NOTE — Progress Notes (Signed)
[facility]  [phone] Fax:(336) [phone]  ID: [name] OB: 1940/06/28  MR#: [id number]  CLE#:[id number]  Patient Care Team: Derinda Late, MD as PCP - General (Family Medicine)  CHIEF COMPLAINT: Left breast DCIS  INTERVAL HISTORY: Patient returns to clinic today for routine six-month evaluation. She currently feels well and is asymptomatic. She has no neurologic complaints. She denies any recent fevers or illnesses. She has no chest pain or shortness of breath.  She has a good appetite and denies weight loss.  She has no nausea, vomiting, constipation, or diarrhea.  She has no new urinary complaints. Patient offers no specific complaints today.   REVIEW OF SYSTEMS:   Review of Systems  Constitutional: Negative.  Negative for fever and malaise/fatigue.  Respiratory: Negative.  Negative for shortness of breath.   Cardiovascular: Negative.  Negative for chest pain.  Gastrointestinal: Negative.  Negative for abdominal pain.  Genitourinary: Negative.   Musculoskeletal: Negative.   Neurological: Negative.  Negative for sensory change and weakness.  Endo/Heme/Allergies: Does not bruise/bleed easily.  Psychiatric/Behavioral: Negative.     As per HPI. Otherwise, a complete review of systems is negative.  PAST MEDICAL HISTORY: Past Medical History:  Diagnosis Date  . Arthritis   . Breast cancer ([place]) 2011   radiation- Left  . Hypertension   . Personal history of radiation therapy   . PVD (peripheral vascular disease) ([place])     PAST SURGICAL HISTORY: Past Surgical History:  Procedure Laterality Date  . ABDOMINAL HYSTERECTOMY    . BREAST EXCISIONAL BIOPSY Left 12/21/2009   positive, radiation  . CATARACT EXTRACTION W/ INTRAOCULAR LENS IMPLANT & ANTERIOR VITRECTOMY, BILATERAL    . CHOLECYSTECTOMY    . KNEE ARTHROPLASTY Right 01/24/2016   Procedure: COMPUTER ASSISTED TOTAL KNEE ARTHROPLASTY;  Surgeon: Dereck Leep, MD;  Location: [facility] ORS;   Service: Orthopedics;  Laterality: Right;  . stent in Lt leg Left     FAMILY HISTORY: Reviewed and unchanged. No reported history of malignancy or chronic disease.     ADVANCED DIRECTIVES:    HEALTH MAINTENANCE: Social History  Substance Use Topics  . Smoking status: Former Research scientist (life sciences)  . Smokeless tobacco: Never Used  . Alcohol use No     Colonoscopy:  PAP:  Bone density:  Lipid panel:  Allergies  Allergen Reactions  . Ibuprofen Hives  . Aleve [Naproxen Sodium] Hives  . Avapro  [Irbesartan] Rash    Other reaction(s): Weal  . Other Other (See Comments)    Nickel"ears ran fluid and itching" redness and "sensitive" Nickel"ears ran fluid and itching"    Current Outpatient Prescriptions  Medication Sig Dispense Refill  . acetaminophen (TYLENOL) 500 MG tablet Take 1,000 mg by mouth every 6 (six) hours as needed for mild pain.    Marland Kitchen aliskiren (TEKTURNA) 300 MG tablet TAKE ONE TABLET BY MOUTH ONCE DAILY    . amLODipine (NORVASC) 10 MG tablet TAKE ONE TABLET BY MOUTH ONCE DAILY FOR BLOOD PRESSURE    . atenolol (TENORMIN) 100 MG tablet TAKE ONE TABLET BY MOUTH ONCE DAILY    . Calcium Carbonate-Vit D-Min (CALTRATE 600+D PLUS PO) Take 1 tablet by mouth daily.     . hydrochlorothiazide (HYDRODIURIL) 25 MG tablet TAKE ONE TABLET BY MOUTH ONCE DAILY    . Multiple Vitamin (MULTI-VITAMINS) TABS Take 1 tablet by mouth daily.     . potassium chloride SA (KLOR-CON M20) 20 MEQ tablet TAKE ONE TABLET BY MOUTH ONCE DAILY    . simvastatin (ZOCOR)  10 MG tablet Take 10 mg by mouth daily at 6 PM.     . timolol (TIMOPTIC) 0.5 % ophthalmic solution Place 1 drop into both eyes daily.     Marland Kitchen enoxaparin (LOVENOX) 40 MG/0.4ML injection Inject 0.4 mLs (40 mg total) into the skin daily. (Patient not taking: Reported on 05/30/2016) 14 Syringe 0  . fluticasone (FLONASE) 50 MCG/ACT nasal spray Place into the nose.    Marland Kitchen GLUCOSAMINE-CHONDROITIN PO Take 2 tablets by mouth daily.    Marland Kitchen oxyCODONE (OXY IR/ROXICODONE)  5 MG immediate release tablet Take 1-2 tablets (5-10 mg total) by mouth every 4 (four) hours as needed for severe pain or breakthrough pain. (Patient not taking: Reported on 05/30/2016) 30 tablet 0  . traMADol (ULTRAM) 50 MG tablet Take 1-2 tablets (50-100 mg total) by mouth every 4 (four) hours as needed for moderate pain. (Patient not taking: Reported on 05/30/2016) 60 tablet 0   No current facility-administered medications for this visit.     OBJECTIVE: Vitals:   07/11/16 1357  BP: (!) 179/72  Pulse: 72  Resp: 20  Temp: 98.4 F (36.9 C)     Body mass index is 28.87 kg/m.    ECOG FS:0 - Asymptomatic  General: Well-developed, well-nourished, no acute distress. Eyes: Pink conjunctiva, anicteric sclera. Breasts: Bilateral breast and axilla without lumps or masses. Patient requested exam by deferred today. Lungs: Clear to auscultation bilaterally. Heart: Regular rate and rhythm. No rubs, murmurs, or gallops. Abdomen: Soft, nontender, nondistended. No organomegaly noted, normoactive bowel sounds. Musculoskeletal: No edema, cyanosis, or clubbing. Neuro: Alert, answering all questions appropriately. Cranial nerves grossly intact. Skin: No rashes or petechiae noted. Psych: Normal affect.   LAB RESULTS:  Lab Results  Component Value Date   NA 138 01/26/2016   K 3.4 (L) 01/26/2016   CL 105 01/26/2016   CO2 27 01/26/2016   GLUCOSE 111 (H) 01/26/2016   BUN 14 01/26/2016   CREATININE 0.78 01/26/2016   CALCIUM 8.5 (L) 01/26/2016   PROT 7.4 01/10/2016   ALBUMIN 3.9 01/10/2016   AST 47 (H) 01/10/2016   ALT 31 01/10/2016   ALKPHOS 85 01/10/2016   BILITOT 1.4 (H) 01/10/2016   GFRNONAA >60 01/26/2016   GFRAA >60 01/26/2016    Lab Results  Component Value Date   WBC 5.0 07/11/2016   NEUTROABS 2.2 07/11/2016   HGB 13.1 07/11/2016   HCT 35.8 07/11/2016   MCV 98.7 07/11/2016   PLT 135 (L) 07/11/2016     STUDIES: No results found.  ASSESSMENT: DCIS, status post  lumpectomy.  PLAN:    1.  DCIS: Patient has now completed 5 years of tamoxifen. Her most recent mammogram on November 23, 2015 was reviewed independently and reported as BI-RADS 2. Repeat screening mammography November 2018. After discussion with the patient it was agreed upon that no further follow up is necessary.  Continue yearly mammograms which can be now ordered by her PCP.  2.  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  Patient's platelet count decreased but stable and unchanged.  No intervention needed at this time. 3. Hypertension: Patient's blood pressure is elevated today. Continue treatment and monitoring per PCP.  Patient expressed understanding and was in agreement with this plan. She also understands that She can call clinic at any time with any questions, concerns, or complaints.   Lloyd Huger, MD   07/14/2016 8:12 PM

## 2016-07-11 ENCOUNTER — Inpatient Hospital Stay: Payer: Medicare Other | Attending: Oncology

## 2016-07-11 ENCOUNTER — Inpatient Hospital Stay (HOSPITAL_BASED_OUTPATIENT_CLINIC_OR_DEPARTMENT_OTHER): Payer: Medicare Other | Admitting: Oncology

## 2016-07-11 VITALS — BP 179/72 | HR 72 | Temp 98.4°F | Resp 20 | Wt 147.8 lb

## 2016-07-11 DIAGNOSIS — D696 Thrombocytopenia, unspecified: Secondary | ICD-10-CM | POA: Diagnosis not present

## 2016-07-11 DIAGNOSIS — I739 Peripheral vascular disease, unspecified: Secondary | ICD-10-CM | POA: Diagnosis not present

## 2016-07-11 DIAGNOSIS — Z923 Personal history of irradiation: Secondary | ICD-10-CM | POA: Diagnosis not present

## 2016-07-11 DIAGNOSIS — I10 Essential (primary) hypertension: Secondary | ICD-10-CM | POA: Diagnosis not present

## 2016-07-11 DIAGNOSIS — Z86000 Personal history of in-situ neoplasm of breast: Secondary | ICD-10-CM | POA: Diagnosis not present

## 2016-07-11 DIAGNOSIS — Z87891 Personal history of nicotine dependence: Secondary | ICD-10-CM

## 2016-07-11 DIAGNOSIS — Z79899 Other long term (current) drug therapy: Secondary | ICD-10-CM

## 2016-07-11 DIAGNOSIS — M129 Arthropathy, unspecified: Secondary | ICD-10-CM | POA: Insufficient documentation

## 2016-07-11 DIAGNOSIS — D0510 Intraductal carcinoma in situ of unspecified breast: Secondary | ICD-10-CM

## 2016-07-11 LAB — CBC WITH DIFFERENTIAL/PLATELET
BASOS ABS: 0 10*3/uL (ref 0–0.1)
Basophils Relative: 1 %
Eosinophils Absolute: 0.2 10*3/uL (ref 0–0.7)
Eosinophils Relative: 4 %
HEMATOCRIT: 35.8 % (ref 35.0–47.0)
Hemoglobin: 13.1 g/dL (ref 12.0–16.0)
LYMPHS ABS: 2 10*3/uL (ref 1.0–3.6)
Lymphocytes Relative: 41 %
MCH: 36 pg — ABNORMAL HIGH (ref 26.0–34.0)
MCHC: 36.5 g/dL — ABNORMAL HIGH (ref 32.0–36.0)
MCV: 98.7 fL (ref 80.0–100.0)
MONO ABS: 0.6 10*3/uL (ref 0.2–0.9)
Monocytes Relative: 11 %
NEUTROS ABS: 2.2 10*3/uL (ref 1.4–6.5)
Neutrophils Relative %: 43 %
Platelets: 135 10*3/uL — ABNORMAL LOW (ref 150–440)
RBC: 3.63 MIL/uL — AB (ref 3.80–5.20)
RDW: 12.7 % (ref 11.5–14.5)
WBC: 5 10*3/uL (ref 3.6–11.0)

## 2016-07-11 NOTE — Progress Notes (Signed)
Patient denies any concerns today.  

## 2016-07-22 ENCOUNTER — Encounter (INDEPENDENT_AMBULATORY_CARE_PROVIDER_SITE_OTHER): Payer: Medicare Other

## 2016-07-22 ENCOUNTER — Ambulatory Visit (INDEPENDENT_AMBULATORY_CARE_PROVIDER_SITE_OTHER): Payer: Medicare Other | Admitting: Vascular Surgery

## 2016-12-10 ENCOUNTER — Other Ambulatory Visit (INDEPENDENT_AMBULATORY_CARE_PROVIDER_SITE_OTHER): Payer: Self-pay | Admitting: Vascular Surgery

## 2016-12-10 DIAGNOSIS — I739 Peripheral vascular disease, unspecified: Secondary | ICD-10-CM

## 2016-12-12 ENCOUNTER — Ambulatory Visit (INDEPENDENT_AMBULATORY_CARE_PROVIDER_SITE_OTHER): Payer: Medicare Other | Admitting: Vascular Surgery

## 2016-12-12 ENCOUNTER — Ambulatory Visit (INDEPENDENT_AMBULATORY_CARE_PROVIDER_SITE_OTHER): Payer: Medicare Other

## 2016-12-12 ENCOUNTER — Encounter (INDEPENDENT_AMBULATORY_CARE_PROVIDER_SITE_OTHER): Payer: Self-pay | Admitting: Vascular Surgery

## 2016-12-12 ENCOUNTER — Encounter (INDEPENDENT_AMBULATORY_CARE_PROVIDER_SITE_OTHER): Payer: Medicare Other

## 2016-12-12 ENCOUNTER — Other Ambulatory Visit (INDEPENDENT_AMBULATORY_CARE_PROVIDER_SITE_OTHER): Payer: Self-pay | Admitting: Vascular Surgery

## 2016-12-12 ENCOUNTER — Other Ambulatory Visit (INDEPENDENT_AMBULATORY_CARE_PROVIDER_SITE_OTHER): Payer: Medicare Other

## 2016-12-12 VITALS — BP 157/66 | HR 65 | Resp 16 | Ht 60.0 in | Wt 144.0 lb

## 2016-12-12 DIAGNOSIS — I739 Peripheral vascular disease, unspecified: Secondary | ICD-10-CM

## 2016-12-12 DIAGNOSIS — I10 Essential (primary) hypertension: Secondary | ICD-10-CM

## 2016-12-12 DIAGNOSIS — M170 Bilateral primary osteoarthritis of knee: Secondary | ICD-10-CM | POA: Diagnosis not present

## 2016-12-12 DIAGNOSIS — I70219 Atherosclerosis of native arteries of extremities with intermittent claudication, unspecified extremity: Secondary | ICD-10-CM | POA: Diagnosis not present

## 2016-12-12 DIAGNOSIS — I700 Atherosclerosis of aorta: Secondary | ICD-10-CM | POA: Diagnosis not present

## 2016-12-12 DIAGNOSIS — E782 Mixed hyperlipidemia: Secondary | ICD-10-CM

## 2016-12-12 NOTE — Progress Notes (Signed)
MRN : [id number]  [name] is a 76 y.o. (July 03, 1940) female who presents with chief complaint of  Chief Complaint  Patient presents with  . Follow-up    70mos. Aorta Iliac ABI BLE ART DUP  .  History of Present Illness: The patient returns to the office for followup and review of the noninvasive studies. There have been no interval changes in lower extremity symptoms. No interval shortening of the patient's claudication distance or development of rest pain symptoms. No new ulcers or wounds have occurred since the last visit.  There have been no significant changes to the patient's overall health care.  The patient denies amaurosis fugax or recent TIA symptoms. There are no recent neurological changes noted. The patient denies history of DVT, PE or superficial thrombophlebitis. The patient denies recent episodes of angina or shortness of breath.  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 acetaminophen (TYLENOL) 500 MG tablet Take 1,000 mg by mouth every 6 (six) hours as needed for mild pain.  Marland Kitchen aliskiren (TEKTURNA) 300 MG tablet TAKE ONE TABLET BY MOUTH ONCE DAILY  . amLODipine (NORVASC) 10 MG tablet TAKE ONE TABLET BY MOUTH ONCE DAILY FOR BLOOD PRESSURE  . atenolol (TENORMIN) 100 MG tablet TAKE ONE TABLET BY MOUTH ONCE DAILY  . Calcium Carbonate-Vit D-Min (CALTRATE 600+D PLUS PO) Take 1 tablet by mouth daily.   Marland Kitchen GLUCOSAMINE-CHONDROITIN PO Take 2 tablets by mouth daily.  . hydrochlorothiazide (HYDRODIURIL) 25 MG tablet TAKE ONE TABLET BY MOUTH ONCE  DAILY  . Multiple Vitamin (MULTI-VITAMINS) TABS Take 1 tablet by mouth daily.   . potassium chloride SA (KLOR-CON M20) 20 MEQ tablet TAKE ONE TABLET BY MOUTH ONCE DAILY  . simvastatin (ZOCOR) 10 MG tablet Take 10 mg by mouth daily at 6 PM.   . timolol (TIMOPTIC) 0.5 % ophthalmic solution Place 1 drop into both eyes daily.     Past Medical History:  Diagnosis Date  . Arthritis   . Breast cancer ([place]) 2011   radiation- Left  . Hypertension   . Personal history of radiation therapy   . PVD (peripheral vascular disease) ([place])     Past Surgical History:  Procedure Laterality Date  . ABDOMINAL HYSTERECTOMY    . BREAST EXCISIONAL BIOPSY Left 12/21/2009   positive, radiation  . CATARACT EXTRACTION W/ INTRAOCULAR LENS IMPLANT & ANTERIOR VITRECTOMY, BILATERAL    . CHOLECYSTECTOMY    . KNEE ARTHROPLASTY Right 01/24/2016   Procedure: COMPUTER ASSISTED TOTAL KNEE ARTHROPLASTY;  Surgeon: Dereck Leep, MD;  Location: [facility] ORS;  Service: Orthopedics;  Laterality: Right;  . stent in Lt leg Left     Social History Social History   Tobacco Use  . Smoking status: Former Research scientist (life sciences)  . Smokeless tobacco: Never Used  Substance Use Topics  . Alcohol use: No    Alcohol/week: 0.0 oz  . Drug use: No    Family History No family history on file.  Allergies  Allergen Reactions  . Ibuprofen Hives  . Aleve [Naproxen Sodium] Hives  . Avapro  [Irbesartan] Rash    Other reaction(s): Weal  .  Other Other (See Comments)    Nickel"ears ran fluid and itching" redness and "sensitive" Nickel"ears ran fluid and itching"     REVIEW OF SYSTEMS (Negative unless checked)  Constitutional: [] Weight loss  [] Fever  [] Chills Cardiac: [] Chest pain   [] Chest pressure   [] Palpitations   [] Shortness of breath when laying flat   [] Shortness of breath with exertion. Vascular:  [x] Pain in legs with walking   [] Pain in legs at rest  [] History of DVT   [] Phlebitis   [] Swelling in legs   [] Varicose veins   [] Non-healing  ulcers Pulmonary:   [] Uses home oxygen   [] Productive cough   [] Hemoptysis   [] Wheeze  [] COPD   [] Asthma Neurologic:  [] Dizziness   [] Seizures   [] History of stroke   [] History of TIA  [] Aphasia   [] Vissual changes   [x]  numbness in arm   [x]   numbness in leg Musculoskeletal:   [] Joint swelling   [x] Joint pain   [x] Low back pain Hematologic:  [] Easy bruising  [] Easy bleeding   [] Hypercoagulable state   [] Anemic Gastrointestinal:  [] Diarrhea   [] Vomiting  [] Gastroesophageal reflux/heartburn   [] Difficulty swallowing. Genitourinary:  [] Chronic kidney disease   [] Difficult urination  [] Frequent urination   [] Blood in urine Skin:  [] Rashes   [] Ulcers  Psychological:  [] History of anxiety   []  History of major depression.  Physical Examination  Vitals:   12/12/16 0942  BP: (!) 157/66  Pulse: 65  Resp: 16  Weight: 65.3 kg (144 lb)  Height: 5' (1.524 m)   Body mass index is 28.12 kg/m. Gen: WD/WN, NAD Head: [place]/AT, No temporalis wasting.  Ear/Nose/Throat: Hearing grossly intact, nares w/o erythema or drainage Eyes: PER, EOMI, sclera nonicteric.  Neck: Supple, no large masses.   Pulmonary:  Good air movement, no audible wheezing bilaterally, no use of accessory muscles.  Cardiac: RRR, no JVD Vascular:  Vessel Right Left  Radial Palpable Palpable  Popliteal Trace Palpable Trace Palpable  PT Not Palpable Not Palpable  DP Not Palpable Not Palpable  Gastrointestinal: Non-distended. No guarding/no peritoneal signs.  Musculoskeletal: M/S 5/5 throughout.  No deformity or atrophy.  Neurologic: CN 2-12 intact. Symmetrical.  Speech is fluent. Motor exam as listed above. Psychiatric: Judgment intact, Mood & affect appropriate for pt's clinical situation. Dermatologic: No rashes or ulcers noted.  No changes consistent with cellulitis. Lymph : No lichenification or skin changes of chronic lymphedema.  CBC Lab Results  Component Value Date   WBC 5.0 07/11/2016   HGB 13.1 07/11/2016   HCT 35.8  07/11/2016   MCV 98.7 07/11/2016   PLT 135 (L) 07/11/2016    BMET    Component Value Date/Time   NA 138 01/26/2016 0418   K 3.4 (L) 01/26/2016 0418   CL 105 01/26/2016 0418   CO2 27 01/26/2016 0418   GLUCOSE 111 (H) 01/26/2016 0418   BUN 14 01/26/2016 0418   CREATININE 0.78 01/26/2016 0418   CALCIUM 8.5 (L) 01/26/2016 0418   GFRNONAA >60 01/26/2016 0418   GFRAA >60 01/26/2016 0418   CrCl cannot be calculated (Patient's most recent lab result is older than the maximum 21 days allowed.).  COAG Lab Results  Component Value Date   INR 1.02 01/10/2016    Radiology No results found.   Assessment/Plan 1. Peripheral vascular disease ([place])  Recommend:  The patient has evidence of atherosclerosis of the lower extremities with claudication.  The patient does not voice lifestyle limiting changes at this point in time.  Noninvasive studies do not  suggest clinically significant change.  No invasive studies, angiography or surgery at this time The patient should continue walking and begin a more formal exercise program.  The patient should continue antiplatelet therapy and aggressive treatment of the lipid abnormalities  No changes in the patient's medications at this time  The patient should continue wearing graduated compression socks 10-15 mmHg strength to control the mild edema.   - VAS [place] ABI WITH/WO TBI; Future - VAS US AORTA/IVC/ILIACS; Future  2. Essential hypertension Continue antihypertensive medications as already ordered, these medications have been reviewed and there are no changes at this time.   3. Primary osteoarthritis of both knees Continue NSAID medications as already ordered, these medications have been reviewed and there are no changes at this time.  She will follow up with Dr Marry Guan as I do not feel that her numbness of the toes bilaterally at rest is related to her vascular disease   4. Mixed hyperlipidemia Continue statin as ordered and reviewed,  no changes at this time     Hortencia Pilar, MD  12/12/2016 10:03 AM

## 2017-01-10 ENCOUNTER — Other Ambulatory Visit: Payer: Self-pay | Admitting: Family Medicine

## 2017-01-10 DIAGNOSIS — Z1231 Encounter for screening mammogram for malignant neoplasm of breast: Secondary | ICD-10-CM

## 2017-01-12 ENCOUNTER — Encounter: Payer: Self-pay | Admitting: Emergency Medicine

## 2017-01-12 ENCOUNTER — Emergency Department
Admission: EM | Admit: 2017-01-12 | Discharge: 2017-01-12 | Disposition: A | Discharge status: Home / Self Care | Payer: Medicare Other | Attending: Emergency Medicine | Admitting: Emergency Medicine

## 2017-01-12 ENCOUNTER — Other Ambulatory Visit: Payer: Self-pay

## 2017-01-12 DIAGNOSIS — Z87891 Personal history of nicotine dependence: Secondary | ICD-10-CM | POA: Diagnosis not present

## 2017-01-12 DIAGNOSIS — Z96651 Presence of right artificial knee joint: Secondary | ICD-10-CM | POA: Insufficient documentation

## 2017-01-12 DIAGNOSIS — Z79899 Other long term (current) drug therapy: Secondary | ICD-10-CM | POA: Diagnosis not present

## 2017-01-12 DIAGNOSIS — T7840XA Allergy, unspecified, initial encounter: Secondary | ICD-10-CM | POA: Insufficient documentation

## 2017-01-12 DIAGNOSIS — I10 Essential (primary) hypertension: Secondary | ICD-10-CM | POA: Diagnosis not present

## 2017-01-12 DIAGNOSIS — L509 Urticaria, unspecified: Secondary | ICD-10-CM | POA: Diagnosis present

## 2017-01-12 MED ORDER — DIPHENHYDRAMINE HCL 25 MG PO CAPS
25.0000 mg | ORAL_CAPSULE | Freq: Once | ORAL | Status: AC
  Administered 2017-01-12: 25 mg via ORAL
  Filled 2017-01-12: qty 1

## 2017-01-12 MED ORDER — EPINEPHRINE 0.3 MG/0.3ML IJ SOAJ: 0.3000 mg | Freq: Once | INTRAMUSCULAR | 0 refills | Status: AC

## 2017-01-12 MED ORDER — DIPHENHYDRAMINE HCL 50 MG PO TABS: 25.0000 mg | ORAL_TABLET | Freq: Four times a day (QID) | ORAL | 0 refills | Status: DC | PRN

## 2017-01-12 MED ORDER — FAMOTIDINE 20 MG PO TABS
20.0000 mg | ORAL_TABLET | Freq: Once | ORAL | Status: AC
  Administered 2017-01-12: 20 mg via ORAL
  Filled 2017-01-12: qty 1

## 2017-01-12 MED ORDER — PREDNISONE 20 MG PO TABS: 60.0000 mg | ORAL_TABLET | Freq: Every day | ORAL | 0 refills | Status: DC

## 2017-01-12 MED ORDER — PREDNISONE 20 MG PO TABS
60.0000 mg | ORAL_TABLET | Freq: Once | ORAL | Status: AC
  Administered 2017-01-12: 60 mg via ORAL
  Filled 2017-01-12: qty 3

## 2017-01-12 MED ORDER — FAMOTIDINE 40 MG PO TABS: 40.0000 mg | ORAL_TABLET | Freq: Every evening | ORAL | 0 refills | Status: DC

## 2017-01-12 NOTE — ED Provider Notes (Addendum)
[facility] Emergency Department Provider Note  ____________________________________________   I have reviewed the triage vital signs and the nursing notes. Where available I have reviewed prior notes and, if possible and indicated, outside hospital notes.    HISTORY  Chief Complaint Allergic Reaction    HPI [name] is a 76 y.o. female started on a Z-Pak for URI symptoms, took the first dose today but she was still sneezing and had a runny nose, and she began having hives shortly thereafter.  Mostly on her arms and legs.  No throat or tongue swelling, no history of this that she can recall, no angioedema no difficulty speaking or swallowing no difficulty talking.  Symptoms started shortly before arrival.  No other prior treatment.  No other associated symptoms.      Past Medical History:  Diagnosis Date  . Arthritis   . Breast cancer ([place]) 2011   radiation- Left  . Hypertension   . Personal history of radiation therapy   . PVD (peripheral vascular disease) [facility])     Patient Active Problem List   Diagnosis Date Noted  . 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  . Pain 01/22/2016  . 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/2016  . Peripheral vascular disease ([place]) 11/22/2014  . H/O malignant neoplasm of breast 11/22/2014  . HLD (hyperlipidemia) 11/22/2014  . BP (high blood pressure) 11/22/2014  . 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 ABDOMINAL HYSTERECTOMY    . BREAST EXCISIONAL BIOPSY Left 12/21/2009   positive, radiation  . CATARACT EXTRACTION W/ INTRAOCULAR LENS IMPLANT & ANTERIOR VITRECTOMY, BILATERAL    . CHOLECYSTECTOMY    . KNEE ARTHROPLASTY Right 01/24/2016   Procedure: COMPUTER ASSISTED TOTAL KNEE ARTHROPLASTY;  Surgeon: Dereck Leep, MD;  Location: [facility] ORS;  Service: Orthopedics;  Laterality: Right;  . stent in Lt leg Left     Prior to Admission medications    Medication Sig Start Date End Date Taking? Authorizing Provider  acetaminophen (TYLENOL) 500 MG tablet Take 1,000 mg by mouth every 6 (six) hours as needed for mild pain.    [provider]  aliskiren (TEKTURNA) 300 MG tablet TAKE ONE TABLET BY MOUTH ONCE DAILY 06/29/14   [provider]  amLODipine (NORVASC) 10 MG tablet TAKE ONE TABLET BY MOUTH ONCE DAILY FOR BLOOD PRESSURE 07/14/14   [provider]  atenolol (TENORMIN) 100 MG tablet TAKE ONE TABLET BY MOUTH ONCE DAILY 10/10/14   [provider]  Calcium Carbonate-Vit D-Min (CALTRATE 600+D PLUS PO) Take 1 tablet by mouth daily.     [provider]  enoxaparin (LOVENOX) 40 MG/0.4ML injection Inject 0.4 mLs (40 mg total) into the skin daily. Patient not taking: Reported on 05/30/2016 01/26/16   Watt Climes, PA  fluticasone [facility]) 50 MCG/ACT nasal spray Place into the nose. 06/06/14 06/06/15  [provider]  GLUCOSAMINE-CHONDROITIN PO Take 2 tablets by mouth daily.    [provider]  hydrochlorothiazide (HYDRODIURIL) 25 MG tablet TAKE ONE TABLET BY MOUTH ONCE DAILY 10/10/14   [provider]  Multiple Vitamin (MULTI-VITAMINS) TABS Take 1 tablet by mouth daily.     [provider]  oxyCODONE (OXY IR/ROXICODONE) 5 MG immediate release tablet Take 1-2 tablets (5-10 mg total) by mouth every 4 (four) hours as needed for severe pain or breakthrough pain. Patient not taking: Reported on 05/30/2016 01/26/16   Watt Climes, PA  potassium chloride  SA (KLOR-CON M20) 20 MEQ tablet TAKE ONE TABLET BY MOUTH ONCE DAILY 08/05/14   [provider]  simvastatin (ZOCOR) 10 MG tablet Take 10 mg by mouth daily at 6 PM.  06/06/14   [provider]  timolol (TIMOPTIC) 0.5 % ophthalmic solution Place 1 drop into both eyes daily.     [provider]  traMADol (ULTRAM) 50 MG tablet Take 1-2 tablets (50-100 mg total) by mouth every 4 (four) hours as needed for moderate  pain. Patient not taking: Reported on 05/30/2016 01/26/16   Watt Climes, PA    Allergies Ibuprofen; Aleve [naproxen sodium]; Avapro  [irbesartan]; and Other  No family history on file.  Social History Social History   Tobacco Use  . Smoking status: Former Research scientist (life sciences)  . Smokeless tobacco: Never Used  Substance Use Topics  . Alcohol use: No    Alcohol/week: 0.0 oz  . Drug use: No    Review of Systems Constitutional: No fever/chills Eyes: No visual changes. ENT: No sore throat. No stiff neck no neck pain Cardiovascular: Denies chest pain. Respiratory: Denies shortness of breath. Gastrointestinal:   no vomiting.  No diarrhea.  No constipation. Genitourinary: Negative for dysuria. Musculoskeletal: Negative lower extremity swelling Skin: Hives Neurological: Negative for severe headaches, focal weakness or numbness.   ____________________________________________   PHYSICAL EXAM:  VITAL SIGNS: ED Triage Vitals  Enc Vitals Group     BP 01/12/17 1813 (!) 111/45     Pulse Rate 01/12/17 1813 82     Resp --      Temp 01/12/17 1813 97.6 F (36.4 C)     Temp Source 01/12/17 1813 Oral     SpO2 01/12/17 1813 96 %     Weight 01/12/17 1814 142 lb (64.4 kg)     Height 01/12/17 1814 5' (1.524 m)     Head Circumference --      Peak Flow --      Pain Score --      Pain Loc --      Pain Edu? --      Excl. in [place]? --     Constitutional: Alert and oriented. Well appearing and in no acute distress. Eyes: Conjunctivae are normal Head: Atraumatic HEENT: No congestion/rhinnorhea. Mucous membranes are moist.  Oropharynx non-erythematous no angioedema normal voice no stridor oropharynx is normal, no tongue or lip swelling, no stridor Neck:   Nontender with no meningismus, no masses, no stridor Cardiovascular: Normal rate, regular rhythm. Grossly normal heart sounds.  Good peripheral circulation. Respiratory: Normal respiratory effort.  No retractions. Lungs CTAB. Abdominal: Soft and  nontender. No distention. No guarding no rebound Back:  There is no focal tenderness or step off.  there is no midline tenderness there are no lesions noted. there is no CVA tenderness Musculoskeletal: No lower extremity tenderness, no upper extremity tenderness. No joint effusions, no DVT signs strong distal pulses no edema Neurologic:  Normal speech and language. No gross focal neurologic deficits are appreciated.  Skin:  Skin is warm, dry and intact.  Hives noted on legs, some on the trunk also on the arms no other rash noted Psychiatric: Mood and affect are normal. Speech and behavior are normal.  ____________________________________________   LABS (all labs ordered are listed, but only abnormal results are displayed)  Labs Reviewed - No data to display  Pertinent labs  results that were available during my care of the patient were reviewed by me and considered in my medical decision making (see chart  for details). ____________________________________________  EKG  I personally interpreted any EKGs ordered by me or triage _________________________________________  RADIOLOGY  Pertinent labs & imaging results that were available during my care of the patient were reviewed by me and considered in my medical decision making (see chart for details). If possible, patient and/or family made aware of any abnormal findings.  No results found. ____________________________________________    PROCEDURES  Procedure(s) performed: None  Procedures  Critical Care performed: None  ____________________________________________   INITIAL IMPRESSION / ASSESSMENT AND PLAN / ED COURSE  Pertinent labs & imaging results that were available during my care of the patient were reviewed by me and considered in my medical decision making (see chart for details).  Patient here with allergic reaction, at this time is only hives, advised her she cannot take azithromycin or related in the future, we  are giving her medications for this does not require epi at this time but will observe her closely in the emergency room for her hives.  Any progression of symptoms will mandate IV but at this time I feel she can be safely treated orally with just hives.  ----------------------------------------- 7:20 PM on 01/12/2017 -----------------------------------------  States her hives are gone and I do not see them, will continue to observe for a brief period here in the emergency room.  No evidence of anaphylaxis or prolonged observational like would be overkill.  Although we do want to keep her for safe period of time.  Patient in no acute distress,  ----------------------------------------- 8:14 PM on 01/12/2017 -----------------------------------------  ----------------------------------------- 8:14 PM on 01/12/2017 -----------------------------------------  Eager to go home. Wants to leave. No s sx of disease return precautions f/u given and understood.     ____________________________________________   FINAL CLINICAL IMPRESSION(S) / ED DIAGNOSES  Final diagnoses:  None      This chart was dictated using voice recognition software.  Despite best efforts to proofread,  errors can occur which can change meaning.      Schuyler Amor, MD 01/12/17 1850    Schuyler Amor, MD 01/12/17 Lurena Nida    Schuyler Amor, MD 01/12/17 [phone]

## 2017-01-12 NOTE — ED Triage Notes (Signed)
Pt reports started a zpack today at 87 for URI. Pt states has never taken before. Pt itching all over body. Denies any shortness of breath. Pt speaking in complete sentences without difficulty. Room air SpO2 96%. Pt states began taking prednisone Thursday last week. Denies taking any benadryl after itching began.

## 2017-01-12 NOTE — Discharge Instructions (Signed)
Take medications as prescribed use Benadryl if needed for itching, follow closely with primary care doctor, never take azithromycin or any related medication again as you are now allergic to it.  If you have significant allergic reaction including swelling, to your lips, tongue, difficulty breathing, throat swelling, you feel lightheaded or any other concerns return to the emergency department.  If you have a life-threatening allergic reaction use the EpiPen but only if it is significant.

## 2017-01-30 ENCOUNTER — Other Ambulatory Visit: Payer: Self-pay | Admitting: Family Medicine

## 2017-01-30 ENCOUNTER — Ambulatory Visit
Admission: RE | Admit: 2017-01-30 | Discharge: 2017-01-30 | Disposition: A | Discharge status: Home / Self Care | Payer: Medicare Other | Source: Ambulatory Visit | Attending: Family Medicine | Admitting: Family Medicine

## 2017-01-30 DIAGNOSIS — Z1231 Encounter for screening mammogram for malignant neoplasm of breast: Secondary | ICD-10-CM

## 2017-01-30 DIAGNOSIS — Z853 Personal history of malignant neoplasm of breast: Secondary | ICD-10-CM

## 2017-06-16 ENCOUNTER — Ambulatory Visit (INDEPENDENT_AMBULATORY_CARE_PROVIDER_SITE_OTHER): Payer: Medicare Other | Admitting: Vascular Surgery

## 2017-06-16 ENCOUNTER — Encounter (INDEPENDENT_AMBULATORY_CARE_PROVIDER_SITE_OTHER): Payer: Medicare Other

## 2017-06-30 ENCOUNTER — Encounter (INDEPENDENT_AMBULATORY_CARE_PROVIDER_SITE_OTHER): Payer: Medicare Other

## 2017-06-30 ENCOUNTER — Ambulatory Visit (INDEPENDENT_AMBULATORY_CARE_PROVIDER_SITE_OTHER): Payer: Medicare Other | Admitting: Vascular Surgery

## 2017-08-21 ENCOUNTER — Ambulatory Visit (INDEPENDENT_AMBULATORY_CARE_PROVIDER_SITE_OTHER): Payer: Medicare Other

## 2017-08-21 ENCOUNTER — Encounter (INDEPENDENT_AMBULATORY_CARE_PROVIDER_SITE_OTHER): Payer: Self-pay | Admitting: Vascular Surgery

## 2017-08-21 ENCOUNTER — Ambulatory Visit (INDEPENDENT_AMBULATORY_CARE_PROVIDER_SITE_OTHER): Payer: Medicare Other | Admitting: Vascular Surgery

## 2017-08-21 ENCOUNTER — Encounter (INDEPENDENT_AMBULATORY_CARE_PROVIDER_SITE_OTHER): Payer: Self-pay

## 2017-08-21 VITALS — BP 175/76 | HR 65 | Resp 13 | Ht 60.0 in | Wt 141.0 lb

## 2017-08-21 DIAGNOSIS — I739 Peripheral vascular disease, unspecified: Secondary | ICD-10-CM

## 2017-08-21 DIAGNOSIS — I10 Essential (primary) hypertension: Secondary | ICD-10-CM | POA: Diagnosis not present

## 2017-08-21 DIAGNOSIS — E782 Mixed hyperlipidemia: Secondary | ICD-10-CM

## 2017-08-21 DIAGNOSIS — M170 Bilateral primary osteoarthritis of knee: Secondary | ICD-10-CM

## 2017-08-21 DIAGNOSIS — I6523 Occlusion and stenosis of bilateral carotid arteries: Secondary | ICD-10-CM | POA: Diagnosis not present

## 2017-08-21 NOTE — Progress Notes (Signed)
MRN : [id number]  [name] is a 77 y.o. (09-Jun-1940) female who presents with chief complaint of  Chief Complaint  Patient presents with  . Follow-up    6 month ABI and Iliac [place] follow up  .  History of Present Illness:  The patient returns to the office for followup and review of the noninvasive studies. There have been no interval changes in lower extremity symptoms. No interval shortening of the patient's claudication distance or development of rest pain symptoms. No new ulcers or wounds have occurred since the last visit.  There have been no significant changes to the patient's overall health care.  The patient denies amaurosis fugax or recent TIA symptoms. There are no recent neurological changes noted. The patient denies history of DVT, PE or superficial thrombophlebitis. The patient denies recent episodes of angina or shortness of breath.  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  Duplex ultrasound of the right lower extremity shows moderate disease in the common femoral artery and the proximal SFA and moderate left common femoral and deep femoral stenosis, no other hemodynamically significant stenosis identified more distally  ABI's Rt=0.94and Lt=0.94 (previous ABI'sRt=0.92and Lt=0.91) Aorta iliac duplex shows patent arterial system no hemodynamically significant lesions  Current Meds  Medication Sig  . acetaminophen (TYLENOL) 500 MG tablet Take 1,000 mg by mouth every 6 (six) hours as needed for mild pain.  Marland Kitchen aliskiren (TEKTURNA) 300 MG tablet TAKE ONE TABLET BY MOUTH ONCE DAILY  . amLODipine (NORVASC) 10 MG tablet TAKE ONE TABLET BY MOUTH ONCE DAILY FOR BLOOD PRESSURE  . atenolol (TENORMIN) 100 MG tablet TAKE ONE TABLET BY MOUTH ONCE DAILY  . Calcium Carbonate-Vit D-Min (CALTRATE 600+D PLUS PO) Take 1 tablet by mouth daily.   . diphenhydrAMINE (BENADRYL) 50 MG tablet Take 0.5 tablets  (25 mg total) by mouth every 6 (six) hours as needed for itching.  . hydrochlorothiazide (HYDRODIURIL) 25 MG tablet TAKE ONE TABLET BY MOUTH ONCE DAILY  . Multiple Vitamin (MULTI-VITAMINS) TABS Take 1 tablet by mouth daily.   . potassium chloride SA (KLOR-CON M20) 20 MEQ tablet TAKE ONE TABLET BY MOUTH ONCE DAILY  . simvastatin (ZOCOR) 10 MG tablet Take 10 mg by mouth daily at 6 PM.   . timolol (TIMOPTIC) 0.5 % ophthalmic solution Place 1 drop into both eyes daily.   . [DISCONTINUED] diphenhydramine-acetaminophen (TYLENOL PM) 25-500 MG TABS tablet Take by mouth.    Past Medical History:  Diagnosis Date  . Arthritis   . Breast cancer ([place]) 2011   radiation- Left  . Hypertension   . Personal history of radiation therapy   . PVD (peripheral vascular disease) ([place])     Past Surgical History:  Procedure Laterality Date  . ABDOMINAL HYSTERECTOMY    . BREAST BIOPSY Left 12/21/2009   positive, radiation dcis  . BREAST EXCISIONAL BIOPSY Left 01/17/2010   lumpectomy DCIS  . CATARACT EXTRACTION W/ INTRAOCULAR LENS IMPLANT & ANTERIOR VITRECTOMY, BILATERAL    . CHOLECYSTECTOMY    . KNEE ARTHROPLASTY Right 01/24/2016   Procedure: COMPUTER ASSISTED TOTAL KNEE ARTHROPLASTY;  Surgeon: Dereck Leep, MD;  Location: [facility] ORS;  Service: Orthopedics;  Laterality: Right;  . stent in Lt leg Left     Social History Social History   Tobacco Use  . Smoking status: Former Research scientist (life sciences)  . Smokeless tobacco: Never Used  Substance Use Topics  . Alcohol use: No    Alcohol/week: 0.0  standard drinks  . Drug use: No    Family History History reviewed. No pertinent family history.  Allergies  Allergen Reactions  . Ibuprofen Hives  . Aleve [Naproxen Sodium] Hives  . Avapro  [Irbesartan] Rash    Other reaction(s): Weal  . Azithromycin Hives  . Nickel Itching    Redness, itching and fluid discharge with nickel earrings  . Other Other (See Comments)    Nickel"ears ran fluid and itching" redness and  "sensitive" Nickel"ears ran fluid and itching"     REVIEW OF SYSTEMS (Negative unless checked)  Constitutional: [] Weight loss  [] Fever  [] Chills Cardiac: [] Chest pain   [] Chest pressure   [] Palpitations   [] Shortness of breath when laying flat   [] Shortness of breath with exertion. Vascular:  [x] Pain in legs with walking   [] Pain in legs at rest  [] History of DVT   [] Phlebitis   [] Swelling in legs   [] Varicose veins   [] Non-healing ulcers Pulmonary:   [] Uses home oxygen   [] Productive cough   [] Hemoptysis   [] Wheeze  [] COPD   [] Asthma Neurologic:  [] Dizziness   [] Seizures   [] History of stroke   [] History of TIA  [] Aphasia   [] Vissual changes   [] Weakness or numbness in arm   [] Weakness or numbness in leg Musculoskeletal:   [] Joint swelling   [] Joint pain   [] Low back pain Hematologic:  [] Easy bruising  [] Easy bleeding   [] Hypercoagulable state   [] Anemic Gastrointestinal:  [] Diarrhea   [] Vomiting  [] Gastroesophageal reflux/heartburn   [] Difficulty swallowing. Genitourinary:  [] Chronic kidney disease   [] Difficult urination  [] Frequent urination   [] Blood in urine Skin:  [] Rashes   [] Ulcers  Psychological:  [] History of anxiety   []  History of major depression.  Physical Examination  Vitals:   08/21/17 0906  BP: (!) 175/76  Pulse: 65  Resp: 13  Weight: 141 lb (64 kg)  Height: 5' (1.524 m)   Body mass index is 27.54 kg/m. Gen: WD/WN, NAD Head: [place]/AT, No temporalis wasting.  Ear/Nose/Throat: Hearing grossly intact, nares w/o erythema or drainage Eyes: PER, EOMI, sclera nonicteric.  Neck: Supple, no large masses.   Pulmonary:  Good air movement, no audible wheezing bilaterally, no use of accessory muscles.  Cardiac: RRR, no JVD + murmur Vascular: bilateral carotid bruit;  Vessel Right Left  Radial Palpable Palpable  Brachial Palpable Palpable  Carotid Palpable Palpable  Popliteal Palpable Palpable  PT Palpable Palpable  DP Palpable Palpable  Gastrointestinal: Non-distended.  No guarding/no peritoneal signs.  Musculoskeletal: M/S 5/5 throughout.  No deformity or atrophy.  Neurologic: CN 2-12 intact. Symmetrical.  Speech is fluent. Motor exam as listed above. Psychiatric: Judgment intact, Mood & affect appropriate for pt's clinical situation. Dermatologic: No rashes or ulcers noted.  No changes consistent with cellulitis. Lymph : No lichenification or skin changes of chronic lymphedema.  CBC Lab Results  Component Value Date   WBC 5.0 07/11/2016   HGB 13.1 07/11/2016   HCT 35.8 07/11/2016   MCV 98.7 07/11/2016   PLT 135 (L) 07/11/2016    BMET    Component Value Date/Time   NA 138 01/26/2016 0418   K 3.4 (L) 01/26/2016 0418   CL 105 01/26/2016 0418   CO2 27 01/26/2016 0418   GLUCOSE 111 (H) 01/26/2016 0418   BUN 14 01/26/2016 0418   CREATININE 0.78 01/26/2016 0418   CALCIUM 8.5 (L) 01/26/2016 0418   GFRNONAA >60 01/26/2016 0418   GFRAA >60 01/26/2016 0418   CrCl cannot be calculated (Patient's  most recent lab result is older than the maximum 21 days allowed.).  COAG Lab Results  Component Value Date   INR 1.02 01/10/2016    Radiology No results found.   Assessment/Plan 1. Peripheral vascular disease ([place])  Recommend:  The patient has evidence of atherosclerosis of the lower extremities with claudication.  The patient does not voice lifestyle limiting changes at this point in time.  Noninvasive studies do not suggest clinically significant change.  No invasive studies, angiography or surgery at this time The patient should continue walking and begin a more formal exercise program.  The patient should continue antiplatelet therapy and aggressive treatment of the lipid abnormalities  No changes in the patient's medications at this time  The patient should continue wearing graduated compression socks 10-15 mmHg strength to control the mild edema.   - VAS US AORTA/IVC/ILIACS; Future - VAS [place] ABI WITH/WO TBI; Future  2. Bilateral  carotid artery stenosis Recommend:  Given the patient's asymptomatic subcritical stenosis no further invasive testing or surgery at this time.  Continue antiplatelet therapy as prescribed Continue management of CAD, HTN and Hyperlipidemia Healthy heart diet,  encouraged exercise at least 4 times per week  Follow up in 12 months with duplex ultrasound and physical exam   - VAS US CAROTID; Future  3. Essential hypertension Continue antihypertensive medications as already ordered, these medications have been reviewed and there are no changes at this time.   4. Primary osteoarthritis of both knees Continue NSAID medications as already ordered, these medications have been reviewed and there are no changes at this time.  She is s/p right TKR by Dr Marry Guan and has donw well.  Continued activity and therapy was stressed.   5. Mixed hyperlipidemia Continue statin as ordered and reviewed, no changes at this time   Hortencia Pilar, MD  08/21/2017 9:46 AM

## 2017-08-24 ENCOUNTER — Encounter (INDEPENDENT_AMBULATORY_CARE_PROVIDER_SITE_OTHER): Payer: Self-pay | Admitting: Vascular Surgery

## 2017-08-24 DIAGNOSIS — I6529 Occlusion and stenosis of unspecified carotid artery: Secondary | ICD-10-CM | POA: Insufficient documentation

## 2017-09-25 ENCOUNTER — Ambulatory Visit: Payer: Medicare Other | Admitting: Certified Registered Nurse Anesthetist

## 2017-09-25 ENCOUNTER — Ambulatory Visit
Admission: RE | Admit: 2017-09-25 | Discharge: 2017-09-25 | Disposition: A | Discharge status: Home / Self Care | Payer: Medicare Other | Source: Ambulatory Visit | Attending: Gastroenterology | Admitting: Gastroenterology

## 2017-09-25 ENCOUNTER — Encounter
Admission: RE | Disposition: A | Discharge status: Home / Self Care | Payer: Self-pay | Source: Ambulatory Visit | Attending: Gastroenterology

## 2017-09-25 ENCOUNTER — Encounter: Payer: Self-pay | Admitting: Anesthesiology

## 2017-09-25 DIAGNOSIS — Z79899 Other long term (current) drug therapy: Secondary | ICD-10-CM | POA: Diagnosis not present

## 2017-09-25 DIAGNOSIS — Z853 Personal history of malignant neoplasm of breast: Secondary | ICD-10-CM | POA: Insufficient documentation

## 2017-09-25 DIAGNOSIS — Z1211 Encounter for screening for malignant neoplasm of colon: Secondary | ICD-10-CM | POA: Insufficient documentation

## 2017-09-25 DIAGNOSIS — Z886 Allergy status to analgesic agent status: Secondary | ICD-10-CM | POA: Diagnosis not present

## 2017-09-25 DIAGNOSIS — K635 Polyp of colon: Secondary | ICD-10-CM | POA: Insufficient documentation

## 2017-09-25 DIAGNOSIS — I10 Essential (primary) hypertension: Secondary | ICD-10-CM | POA: Insufficient documentation

## 2017-09-25 DIAGNOSIS — K644 Residual hemorrhoidal skin tags: Secondary | ICD-10-CM | POA: Insufficient documentation

## 2017-09-25 DIAGNOSIS — K641 Second degree hemorrhoids: Secondary | ICD-10-CM | POA: Diagnosis not present

## 2017-09-25 DIAGNOSIS — I739 Peripheral vascular disease, unspecified: Secondary | ICD-10-CM | POA: Insufficient documentation

## 2017-09-25 HISTORY — PX: COLONOSCOPY WITH PROPOFOL: SHX5780

## 2017-09-25 SURGERY — COLONOSCOPY WITH PROPOFOL
Anesthesia: General

## 2017-09-25 MED ORDER — PROPOFOL 500 MG/50ML IV EMUL
INTRAVENOUS | Status: AC
  Filled 2017-09-25: qty 50

## 2017-09-25 MED ORDER — SODIUM CHLORIDE 0.9 % IV SOLN
INTRAVENOUS | Status: DC
  Administered 2017-09-25: 1000 mL via INTRAVENOUS

## 2017-09-25 MED ORDER — FENTANYL CITRATE (PF) 100 MCG/2ML IJ SOLN
INTRAMUSCULAR | Status: DC | PRN
  Administered 2017-09-25: 100 ug via INTRAVENOUS

## 2017-09-25 MED ORDER — FENTANYL CITRATE (PF) 100 MCG/2ML IJ SOLN
INTRAMUSCULAR | Status: AC
  Filled 2017-09-25: qty 2

## 2017-09-25 MED ORDER — SODIUM CHLORIDE 0.9 % IV SOLN: INTRAVENOUS | Status: DC

## 2017-09-25 MED ORDER — PROPOFOL 10 MG/ML IV BOLUS
INTRAVENOUS | Status: DC | PRN
  Administered 2017-09-25: 10 mg via INTRAVENOUS
  Administered 2017-09-25 (×2): 20 mg via INTRAVENOUS
  Administered 2017-09-25: 50 mg via INTRAVENOUS

## 2017-09-25 MED ORDER — PROPOFOL 500 MG/50ML IV EMUL
INTRAVENOUS | Status: DC | PRN
  Administered 2017-09-25: 150 ug/kg/min via INTRAVENOUS

## 2017-09-25 NOTE — Transfer of Care (Signed)
Immediate Anesthesia Transfer of Care Note  Patient: [name]  Procedure(s) Performed: COLONOSCOPY WITH PROPOFOL (N/A )  Patient Location: PACU  Anesthesia Type:General  Level of Consciousness: drowsy  Airway & Oxygen Therapy: Patient Spontanous Breathing and Patient connected to nasal cannula oxygen  Post-op Assessment: Report given to RN and Post -op Vital signs reviewed and stable  Post vital signs: Reviewed and stable  Last Vitals:  Vitals Value Taken Time  BP    Temp    Pulse    Resp    SpO2      Last Pain:  Vitals:   09/25/17 0911  TempSrc: Tympanic  PainSc: 0-No pain         Complications: No apparent anesthesia complications

## 2017-09-25 NOTE — Anesthesia Postprocedure Evaluation (Signed)
Anesthesia Post Note  Patient: [name]  Procedure(s) Performed: COLONOSCOPY WITH PROPOFOL (N/A )  Patient location during evaluation: Endoscopy Anesthesia Type: General Level of consciousness: awake and alert Pain management: pain level controlled Vital Signs Assessment: post-procedure vital signs reviewed and stable Respiratory status: spontaneous breathing, nonlabored ventilation, respiratory function stable and patient connected to nasal cannula oxygen Cardiovascular status: blood pressure returned to baseline and stable Postop Assessment: no apparent nausea or vomiting Anesthetic complications: no     Last Vitals:  Vitals:   09/25/17 1046 09/25/17 1056  BP: 132/66 (!) 158/64  Pulse: (!) 54 (!) 59  Resp: (!) 9 11  Temp:    SpO2: 100% 100%    Last Pain:  Vitals:   09/25/17 1056  TempSrc:   PainSc: 0-No pain                 Martha Clan

## 2017-09-25 NOTE — H&P (Signed)
Outpatient short stay form Pre-procedure 09/25/2017 9:48 AM Lollie Sails MD  Primary Physician: Dr Derinda Late  Reason for visit: Colonoscopy  History of present illness: Patient is a 77 year old female presenting today for colon cancer screening.  Last colonoscopy was in 2008 apparently negative at that time.  Does have some problems with diarrhea over the.  Past year.  He has a history of cystectomy remote.  No abdominal pain or rectal bleeding.  Tolerating her prep well.  She takes no aspirin or blood thinning agent with the exception of 81 mg aspirin.    Current Facility-Administered Medications:  .  0.9 %  sodium chloride infusion, , Intravenous, Continuous, Lollie Sails, MD, Last Rate: 20 mL/hr at 09/25/17 0929, 1,000 mL at 09/25/17 0929 .  0.9 %  sodium chloride infusion, , Intravenous, Continuous, Lollie Sails, MD  Medications Prior to Admission  Medication Sig Dispense Refill Last Dose  . acetaminophen (TYLENOL) 500 MG tablet Take 1,000 mg by mouth every 6 (six) hours as needed for mild pain.   Past Week at Unknown time  . aliskiren (TEKTURNA) 300 MG tablet TAKE ONE TABLET BY MOUTH ONCE DAILY   09/24/2017 at Unknown time  . amLODipine (NORVASC) 10 MG tablet TAKE ONE TABLET BY MOUTH ONCE DAILY FOR BLOOD PRESSURE   09/24/2017 at Unknown time  . atenolol (TENORMIN) 100 MG tablet TAKE ONE TABLET BY MOUTH ONCE DAILY   09/24/2017 at Unknown time  . Calcium Carbonate-Vit D-Min (CALTRATE 600+D PLUS PO) Take 1 tablet by mouth daily.    Past Week at Unknown time  . diphenhydrAMINE (BENADRYL) 50 MG tablet Take 0.5 tablets (25 mg total) by mouth every 6 (six) hours as needed for itching. 30 tablet 0 Past Week at Unknown time  . GLUCOSAMINE-CHONDROITIN PO Take 2 tablets by mouth daily.   Past Week at Unknown time  . hydrochlorothiazide (HYDRODIURIL) 25 MG tablet TAKE ONE TABLET BY MOUTH ONCE DAILY   09/24/2017 at Unknown time  . Multiple Vitamin (MULTI-VITAMINS) TABS Take 1  tablet by mouth daily.    Past Week at Unknown time  . potassium chloride SA (KLOR-CON M20) 20 MEQ tablet TAKE ONE TABLET BY MOUTH ONCE DAILY   Past Week at Unknown time  . simvastatin (ZOCOR) 10 MG tablet Take 10 mg by mouth daily at 6 PM.    Past Week at Unknown time  . timolol (TIMOPTIC) 0.5 % ophthalmic solution Place 1 drop into both eyes daily.    Past Week at Unknown time     Allergies  Allergen Reactions  . Ibuprofen Hives  . Aleve [Naproxen Sodium] Hives  . Avapro  [Irbesartan] Rash    Other reaction(s): Weal  . Azithromycin Hives  . Nickel Itching    Redness, itching and fluid discharge with nickel earrings  . Other Other (See Comments)    Nickel"ears ran fluid and itching" redness and "sensitive" Nickel"ears ran fluid and itching"     Past Medical History:  Diagnosis Date  . Arthritis   . Breast cancer ([place]) 2011   radiation- Left  . Hypertension   . Personal history of radiation therapy   . PVD (peripheral vascular disease) ([place])     Review of systems:      Physical Exam    Heart and lungs: Regular rate and rhythm without rub or gallop, lungs are bilaterally clear.    HEENT: Normocephalic atraumatic eyes are anicteric    Other:    Pertinant exam for procedure: Soft  nontender nondistended bowel sounds positive normoactive.    Planned proceedures: Colonoscopy and indicated procedures. I have discussed the risks benefits and complications of procedures to include not limited to bleeding, infection, perforation and the risk of sedation and the patient wishes to proceed.    Lollie Sails, MD Gastroenterology 09/25/2017  9:48 AM

## 2017-09-25 NOTE — Anesthesia Preprocedure Evaluation (Signed)
Anesthesia Evaluation  Patient identified by MRN, date of birth, ID band Patient awake    Reviewed: Allergy & Precautions, NPO status , Patient's Chart, lab work & pertinent test results, reviewed documented beta blocker date and time   History of Anesthesia Complications Negative for: history of anesthetic complications  Airway Mallampati: II  TM Distance: <3 FB     Dental  (+) Upper Dentures, Lower Dentures   Pulmonary neg pulmonary ROS, former smoker,           Cardiovascular Exercise Tolerance: Good hypertension, Pt. on medications and Pt. on home beta blockers (-) angina+ Peripheral Vascular Disease  (-) CAD, (-) Past MI, (-) Cardiac Stents and (-) CABG (-) dysrhythmias (-) Valvular Problems/Murmurs     Neuro/Psych negative neurological ROS  negative psych ROS   GI/Hepatic negative GI ROS, Neg liver ROS,   Endo/Other  negative endocrine ROS  Renal/GU negative Renal ROS  negative genitourinary   Musculoskeletal  (+) Arthritis , Osteoarthritis,    Abdominal   Peds negative pediatric ROS (+)  Hematology negative hematology ROS (+)   Anesthesia Other Findings Past Medical History: No date: Arthritis 2011: Breast cancer ([place])     Comment: radiation- Left No date: Hypertension No date: Personal history of radiation therapy No date: PVD (peripheral vascular disease) (HCC)  Reproductive/Obstetrics negative OB ROS                             Anesthesia Physical  Anesthesia Plan  ASA: III  Anesthesia Plan: General   Post-op Pain Management:    Induction: Intravenous  PONV Risk Score and Plan: 3 and Propofol infusion and TIVA  Airway Management Planned: Natural Airway and Nasal Cannula  Additional Equipment:   Intra-op Plan:   Post-operative Plan:   Informed Consent: I have reviewed the patients History and Physical, chart, labs and discussed the procedure including the  risks, benefits and alternatives for the proposed anesthesia with the patient or authorized representative who has indicated his/her understanding and acceptance.   Dental advisory given  Plan Discussed with: CRNA and Surgeon  Anesthesia Plan Comments:         Anesthesia Quick Evaluation

## 2017-09-25 NOTE — Op Note (Addendum)
[facility] Patient Name: [name] Procedure Date: 09/25/2017 9:47 AM MRN: [id number] Account #: [id number] Date of Birth: February 17, 1940 Admit Type: Outpatient Age: 77 Room: [facility] ENDO ROOM 1 Gender: Female Note Status: Finalized Procedure:            Colonoscopy Indications:          Screening for colorectal malignant neoplasm Providers:            Lollie Sails, MD Referring MD:         Caprice Renshaw MD (Referring MD) Medicines:            Monitored Anesthesia Care Complications:        No immediate complications. Procedure:            Pre-Anesthesia Assessment:                       - ASA Grade Assessment: III - A patient with severe                        systemic disease.                       After obtaining informed consent, the colonoscope was                        passed under direct vision. Throughout the procedure,                        the patient's blood pressure, pulse, and oxygen                        saturations were monitored continuously. The                        Colonoscope was introduced through the anus and                        advanced to the the cecum, identified by appendiceal                        orifice and ileocecal valve. The colonoscopy was                        performed without difficulty. The patient tolerated the                        procedure well. The quality of the bowel preparation                        was good. Findings:      A 2 mm polyp was found in the descending colon. The polyp was sessile.       The polyp was removed with a cold biopsy forceps. Resection and       retrieval were complete.      Biopsies for histology were taken with a cold forceps from the right       colon and left colon for evaluation of microscopic colitis.      The retroflexed view of the distal rectum and anal verge was normal and       showed no anal or rectal abnormalities.  Non-bleeding external and  internal hemorrhoids were found during digital       exam and during anoscopy. The hemorrhoids were small and Grade II       (internal hemorrhoids that prolapse but reduce spontaneously).      No additional abnormalities were found on retroflexion.      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 Impression:           - One 2 mm polyp in the descending colon, removed with                        a cold biopsy forceps. Resected and retrieved.                       - The distal rectum and anal verge are normal on                        retroflexion view.                       - Non-bleeding external and internal hemorrhoids.                       - Biopsies were taken with a cold forceps from the                        right colon and left colon for evaluation of                        microscopic colitis. Recommendation:       - Discharge patient to home.                       - Advance diet as tolerated. Procedure Code(s):    --- Professional ---                       [phone], Colonoscopy, flexible; with biopsy, single or                        multiple Diagnosis Code(s):    --- Professional ---                       Z12.11, Encounter for screening for malignant neoplasm                        of colon                       D12.4, Benign neoplasm of descending colon                       K64.1, Second degree hemorrhoids CPT copyright 2017 American Medical Association. All rights reserved. The codes documented in this report are preliminary and upon coder review may  be revised to meet current compliance requirements. Lollie Sails, MD 09/25/2017 10:21:53 AM This report has been signed electronically. Number of Addenda: 0 Note Initiated On: 09/25/2017 9:47 AM Scope Withdrawal Time: 0 hours 7 minutes 28 seconds  Total Procedure Duration: 0 hours 16 minutes 38 seconds       [facility]

## 2017-09-25 NOTE — Anesthesia Post-op Follow-up Note (Signed)
Anesthesia QCDR form completed.        

## 2017-09-25 NOTE — Anesthesia Procedure Notes (Signed)
Performed by: Demetrius Charity, CRNA Pre-anesthesia Checklist: Patient identified, Emergency Drugs available, Suction available, Timeout performed and Patient being monitored Patient Re-evaluated:Patient Re-evaluated prior to induction Oxygen Delivery Method: Nasal cannula Induction Type: IV induction

## 2017-09-26 LAB — SURGICAL PATHOLOGY

## 2017-09-29 ENCOUNTER — Encounter: Payer: Self-pay | Admitting: Gastroenterology

## 2018-01-01 ENCOUNTER — Other Ambulatory Visit: Payer: Self-pay | Admitting: Family Medicine

## 2018-01-01 DIAGNOSIS — Z1231 Encounter for screening mammogram for malignant neoplasm of breast: Secondary | ICD-10-CM

## 2018-02-02 ENCOUNTER — Ambulatory Visit
Admission: RE | Admit: 2018-02-02 | Discharge: 2018-02-02 | Disposition: A | Discharge status: Home / Self Care | Payer: Medicare Other | Source: Ambulatory Visit | Attending: Family Medicine | Admitting: Family Medicine

## 2018-02-02 DIAGNOSIS — Z1231 Encounter for screening mammogram for malignant neoplasm of breast: Secondary | ICD-10-CM | POA: Diagnosis not present

## 2018-08-27 ENCOUNTER — Ambulatory Visit (INDEPENDENT_AMBULATORY_CARE_PROVIDER_SITE_OTHER): Payer: Medicare Other | Admitting: Vascular Surgery

## 2018-08-27 ENCOUNTER — Encounter (INDEPENDENT_AMBULATORY_CARE_PROVIDER_SITE_OTHER): Payer: Medicare Other

## 2018-10-08 ENCOUNTER — Ambulatory Visit (INDEPENDENT_AMBULATORY_CARE_PROVIDER_SITE_OTHER): Payer: Medicare Other | Admitting: Vascular Surgery

## 2018-10-08 ENCOUNTER — Encounter (INDEPENDENT_AMBULATORY_CARE_PROVIDER_SITE_OTHER): Payer: Medicare Other

## 2018-11-15 NOTE — Progress Notes (Signed)
MRN : [id number]  [name] is a 78 y.o. ([birth year]) female who presents with chief complaint of No chief complaint on file. Marland Kitchen  History of Present Illness:  The patient returns to the office for followup and review of the noninvasive studies. There have been no interval changes in lower extremity symptoms. No interval shortening of the patient's claudication distance or development of rest pain symptoms. No new ulcers or wounds have occurred since the last visit.  There have been no significant changes to the patient's overall health care.  The patient denies amaurosis fugax or recent TIA symptoms. There are no recent neurological changes noted. The patient denies history of DVT, PE or superficial thrombophlebitis. The patient denies recent episodes of angina or shortness of breath.  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  Duplex ultrasound of the right lower extremity shows moderate disease in the common femoral arteryand the proximal SFA and moderate left common femoral and deep femoral stenosis, no other hemodynamically significant stenosis identifiedmore distally  ABI's Rt=0.89and Lt=0.94(previous ABI'sRt=0.94and Lt=0.94) Aorta iliac duplex shows patent arterial system with a 50-75% in the distal aorta  Carotid duplex shows <40% ICA stenosis bilaterally  No outpatient medications have been marked as taking for the 11/16/18 encounter (Appointment) with Delana Meyer, Dolores Lory, MD.    Past Medical History:  Diagnosis Date  . Arthritis   . Breast cancer ([place]) 2011   radiation- Left  . Hypertension   . Personal history of radiation therapy   . PVD (peripheral vascular disease) ([place])     Past Surgical History:  Procedure Laterality Date  . ABDOMINAL HYSTERECTOMY    . BREAST BIOPSY Left 12/21/2009   positive, radiation dcis  . BREAST EXCISIONAL BIOPSY Left 01/17/2010   lumpectomy DCIS  .  BREAST LUMPECTOMY    . CATARACT EXTRACTION W/ INTRAOCULAR LENS IMPLANT & ANTERIOR VITRECTOMY, BILATERAL    . CHOLECYSTECTOMY    . COLONOSCOPY WITH PROPOFOL N/A 09/25/2017   Procedure: COLONOSCOPY WITH PROPOFOL;  Surgeon: Lollie Sails, MD;  Location: [facility] ENDOSCOPY;  Service: Endoscopy;  Laterality: N/A;  . DILATION AND CURETTAGE OF UTERUS    . JOINT REPLACEMENT Right 01/24/2016  . KNEE ARTHROPLASTY Right 01/24/2016   Procedure: COMPUTER ASSISTED TOTAL KNEE ARTHROPLASTY;  Surgeon: Dereck Leep, MD;  Location: [facility] ORS;  Service: Orthopedics;  Laterality: Right;  . stent in Lt leg Left     Social History Social History   Tobacco Use  . Smoking status: Former Research scientist (life sciences)  . Smokeless tobacco: Never Used  Substance Use Topics  . Alcohol use: No    Alcohol/week: 0.0 standard drinks  . Drug use: No    Family History No family history on file.  Allergies  Allergen Reactions  . Ibuprofen Hives  . Aleve [Naproxen Sodium] Hives  . Avapro  [Irbesartan] Rash    Other reaction(s): Weal  . Azithromycin Hives  . Nickel Itching    Redness, itching and fluid discharge with nickel earrings  . Other Other (See Comments)    Nickel"ears ran fluid and itching" redness and "sensitive" Nickel"ears ran fluid and itching"     REVIEW OF SYSTEMS (Negative unless checked)  Constitutional: [] Weight loss  [] Fever  [] Chills Cardiac: [] Chest pain   [] Chest pressure   [] Palpitations   [] Shortness of breath when laying flat   [] Shortness of breath with exertion. Vascular:  [] Pain in legs with walking   [] Pain in legs at rest  []   History of DVT   [] Phlebitis   [] Swelling in legs   [] Varicose veins   [] Non-healing ulcers Pulmonary:   [] Uses home oxygen   [] Productive cough   [] Hemoptysis   [] Wheeze  [] COPD   [] Asthma Neurologic:  [] Dizziness   [] Seizures   [] History of stroke   [] History of TIA  [] Aphasia   [] Vissual changes   [] Weakness or numbness in arm   [] Weakness or numbness in leg  Musculoskeletal:   [] Joint swelling   [] Joint pain   [] Low back pain Hematologic:  [] Easy bruising  [] Easy bleeding   [] Hypercoagulable state   [] Anemic Gastrointestinal:  [] Diarrhea   [] Vomiting  [] Gastroesophageal reflux/heartburn   [] Difficulty swallowing. Genitourinary:  [] Chronic kidney disease   [] Difficult urination  [] Frequent urination   [] Blood in urine Skin:  [] Rashes   [] Ulcers  Psychological:  [] History of anxiety   []  History of major depression.  Physical Examination  There were no vitals filed for this visit. There is no height or weight on file to calculate BMI. Gen: WD/WN, NAD Head: [place]/AT, No temporalis wasting.  Ear/Nose/Throat: Hearing grossly intact, nares w/o erythema or drainage Eyes: PER, EOMI, sclera nonicteric.  Neck: Supple, no large masses.   Pulmonary:  Good air movement, no audible wheezing bilaterally, no use of accessory muscles.  Cardiac: RRR, no JVD Vascular: bilateral carotid stenosis Vessel Right Left  Radial Palpable Palpable  Brachial Palpable Palpable  Carotid Palpable Palpable  PT Not Palpable Not Palpable  DP Not Palpable Not Palpable  Gastrointestinal: Non-distended. No guarding/no peritoneal signs.  Musculoskeletal: M/S 5/5 throughout.  No deformity or atrophy.  Neurologic: CN 2-12 intact. Symmetrical.  Speech is fluent. Motor exam as listed above. Psychiatric: Judgment intact, Mood & affect appropriate for pt's clinical situation. Dermatologic: No rashes or ulcers noted.  No changes consistent with cellulitis. Lymph : No lichenification or skin changes of chronic lymphedema.  CBC Lab Results  Component Value Date   WBC 5.0 07/11/2016   HGB 13.1 07/11/2016   HCT 35.8 07/11/2016   MCV 98.7 07/11/2016   PLT 135 (L) 07/11/2016    BMET    Component Value Date/Time   NA 138 01/26/2016 0418   K 3.4 (L) 01/26/2016 0418   CL 105 01/26/2016 0418   CO2 27 01/26/2016 0418   GLUCOSE 111 (H) 01/26/2016 0418   BUN 14 01/26/2016 0418    CREATININE 0.78 01/26/2016 0418   CALCIUM 8.5 (L) 01/26/2016 0418   GFRNONAA >60 01/26/2016 0418   GFRAA >60 01/26/2016 0418   CrCl cannot be calculated (Patient's most recent lab result is older than the maximum 21 days allowed.).  COAG Lab Results  Component Value Date   INR 1.02 01/10/2016    Radiology No results found.    Assessment/Plan 1. PAD (peripheral artery disease) (HCC)  Recommend:  The patient has evidence of atherosclerosis of the lower extremities with claudication.  The patient does not voice lifestyle limiting changes at this point in time.  Noninvasive studies do not suggest clinically significant change.  No invasive studies, angiography or surgery at this time The patient should continue walking and begin a more formal exercise program.  The patient should continue antiplatelet therapy and aggressive treatment of the lipid abnormalities  No changes in the patient's medications at this time  The patient should continue wearing graduated compression socks 10-15 mmHg strength to control the mild edema.   - VAS [place] ABI WITH/WO TBI; Future  2. Bilateral carotid artery stenosis Recommend:  Given the  patient's asymptomatic subcritical stenosis no further invasive testing or surgery at this time.  Duplex ultrasound shows <40% stenosis bilaterally.  Continue antiplatelet therapy as prescribed Continue management of CAD, HTN and Hyperlipidemia Healthy heart diet,  encouraged exercise at least 4 times per week Follow up in 12 months with duplex ultrasound and physical exam  - VAS US CAROTID; Future  3. Essential hypertension Continue antihypertensive medications as already ordered, these medications have been reviewed and there are no changes at this time.   4. Primary osteoarthritis of both knees Continue NSAID medications as already ordered, these medications have been reviewed and there are no changes at this time.  Continued activity and therapy was  stressed.   5. Mixed hyperlipidemia Continue statin as ordered and reviewed, no changes at this time     Hortencia Pilar, MD  11/15/2018 12:03 PM

## 2018-11-16 ENCOUNTER — Encounter (INDEPENDENT_AMBULATORY_CARE_PROVIDER_SITE_OTHER): Payer: Self-pay | Admitting: Vascular Surgery

## 2018-11-16 ENCOUNTER — Ambulatory Visit (INDEPENDENT_AMBULATORY_CARE_PROVIDER_SITE_OTHER): Payer: Medicare Other

## 2018-11-16 ENCOUNTER — Ambulatory Visit (INDEPENDENT_AMBULATORY_CARE_PROVIDER_SITE_OTHER): Payer: Medicare Other | Admitting: Vascular Surgery

## 2018-11-16 ENCOUNTER — Other Ambulatory Visit: Payer: Self-pay

## 2018-11-16 VITALS — BP 164/78 | HR 69 | Resp 16 | Wt 141.6 lb

## 2018-11-16 DIAGNOSIS — I6523 Occlusion and stenosis of bilateral carotid arteries: Secondary | ICD-10-CM

## 2018-11-16 DIAGNOSIS — I739 Peripheral vascular disease, unspecified: Secondary | ICD-10-CM | POA: Diagnosis not present

## 2018-11-16 DIAGNOSIS — E782 Mixed hyperlipidemia: Secondary | ICD-10-CM

## 2018-11-16 DIAGNOSIS — M170 Bilateral primary osteoarthritis of knee: Secondary | ICD-10-CM | POA: Diagnosis not present

## 2018-11-16 DIAGNOSIS — I10 Essential (primary) hypertension: Secondary | ICD-10-CM

## 2018-12-31 ENCOUNTER — Other Ambulatory Visit: Payer: Self-pay | Admitting: Family Medicine

## 2018-12-31 DIAGNOSIS — Z1231 Encounter for screening mammogram for malignant neoplasm of breast: Secondary | ICD-10-CM

## 2019-03-25 ENCOUNTER — Ambulatory Visit
Admission: RE | Admit: 2019-03-25 | Discharge: 2019-03-25 | Disposition: A | Discharge status: Home / Self Care | Payer: Medicare Other | Source: Ambulatory Visit | Attending: Family Medicine | Admitting: Family Medicine

## 2019-03-25 DIAGNOSIS — Z1231 Encounter for screening mammogram for malignant neoplasm of breast: Secondary | ICD-10-CM | POA: Diagnosis present

## 2019-11-18 ENCOUNTER — Other Ambulatory Visit: Payer: Self-pay

## 2019-11-18 ENCOUNTER — Ambulatory Visit (INDEPENDENT_AMBULATORY_CARE_PROVIDER_SITE_OTHER): Payer: Medicare Other

## 2019-11-18 ENCOUNTER — Ambulatory Visit (INDEPENDENT_AMBULATORY_CARE_PROVIDER_SITE_OTHER): Payer: Medicare Other | Admitting: Vascular Surgery

## 2019-11-18 DIAGNOSIS — I6523 Occlusion and stenosis of bilateral carotid arteries: Secondary | ICD-10-CM

## 2019-11-18 DIAGNOSIS — I739 Peripheral vascular disease, unspecified: Secondary | ICD-10-CM | POA: Diagnosis not present

## 2019-11-22 ENCOUNTER — Ambulatory Visit (INDEPENDENT_AMBULATORY_CARE_PROVIDER_SITE_OTHER): Payer: Medicare Other | Admitting: Vascular Surgery

## 2019-11-25 ENCOUNTER — Other Ambulatory Visit: Payer: Self-pay

## 2019-11-25 ENCOUNTER — Ambulatory Visit (INDEPENDENT_AMBULATORY_CARE_PROVIDER_SITE_OTHER): Payer: Medicare Other | Admitting: Vascular Surgery

## 2019-11-25 ENCOUNTER — Encounter (INDEPENDENT_AMBULATORY_CARE_PROVIDER_SITE_OTHER): Payer: Self-pay | Admitting: Vascular Surgery

## 2019-11-25 VITALS — BP 146/70 | HR 78 | Ht 60.0 in | Wt 134.0 lb

## 2019-11-25 DIAGNOSIS — I739 Peripheral vascular disease, unspecified: Secondary | ICD-10-CM | POA: Diagnosis not present

## 2019-11-25 DIAGNOSIS — M170 Bilateral primary osteoarthritis of knee: Secondary | ICD-10-CM

## 2019-11-25 DIAGNOSIS — Z853 Personal history of malignant neoplasm of breast: Secondary | ICD-10-CM | POA: Insufficient documentation

## 2019-11-25 DIAGNOSIS — I6523 Occlusion and stenosis of bilateral carotid arteries: Secondary | ICD-10-CM

## 2019-11-25 DIAGNOSIS — I10 Essential (primary) hypertension: Secondary | ICD-10-CM | POA: Diagnosis not present

## 2019-11-25 DIAGNOSIS — E785 Hyperlipidemia, unspecified: Secondary | ICD-10-CM | POA: Insufficient documentation

## 2019-11-25 DIAGNOSIS — E782 Mixed hyperlipidemia: Secondary | ICD-10-CM

## 2019-11-25 NOTE — Progress Notes (Signed)
MRN : [id number]  [name] is a 79 y.o. (April 30, 1940) female who presents with chief complaint of  Chief Complaint  Patient presents with  . Follow-up    results for [place] fone on 11/18/19  .  History of Present Illness:   The patient returns to the office for followup and review of the noninvasive studies. There have been no interval changes in lower extremity symptoms. No interval shortening of the patient's claudication distance or development of rest pain symptoms. No new ulcers or wounds have occurred since the last visit.  There have been no significant changes to the patient's overall health care.  The patient denies amaurosis fugax or recent TIA symptoms. There are no recent neurological changes noted. The patient denies history of DVT, PE or superficial thrombophlebitis. The patient denies recent episodes of angina or shortness of breath.  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  Duplex ultrasound of the right lower extremity shows moderate disease in the common femoral arteryand the proximal SFA and moderate left common femoral and deep femoral stenosis, no other hemodynamically significant stenosis identifiedmore distally  ABI's Rt=0.89and Lt=0.94(previous ABI'sRt=0.94and Lt=0.94) Aorta iliac duplex shows patent arterial system with a 50-75% in the distal aorta  Carotid duplex shows <40% ICA stenosis bilaterally  Current Meds  Medication Sig  . acetaminophen (TYLENOL) 500 MG tablet Take 1,000 mg by mouth every 6 (six) hours as needed for mild pain.  Marland Kitchen amLODipine (NORVASC) 10 MG tablet TAKE ONE TABLET BY MOUTH ONCE DAILY FOR BLOOD PRESSURE  . aspirin EC 81 MG tablet Take 81 mg by mouth daily.  Marland Kitchen atenolol (TENORMIN) 100 MG tablet TAKE ONE TABLET BY MOUTH ONCE DAILY  . Calcium Carbonate-Vit D-Min (CALTRATE 600+D PLUS PO) Take 1 tablet by mouth daily.   . diphenhydrAMINE (BENADRYL) 50  MG tablet Take 0.5 tablets (25 mg total) by mouth every 6 (six) hours as needed for itching.  . hydrochlorothiazide (HYDRODIURIL) 25 MG tablet TAKE ONE TABLET BY MOUTH ONCE DAILY  . Multiple Vitamin (MULTI-VITAMINS) TABS Take 1 tablet by mouth daily.   . potassium chloride SA (KLOR-CON M20) 20 MEQ tablet TAKE ONE TABLET BY MOUTH ONCE DAILY  . simvastatin (ZOCOR) 10 MG tablet Take 10 mg by mouth daily at 6 PM.   . timolol (TIMOPTIC) 0.5 % ophthalmic solution Place 1 drop into both eyes daily.     Past Medical History:  Diagnosis Date  . Arthritis   . Breast cancer ([place]) 2011   radiation- Left  . Hypertension   . Personal history of radiation therapy   . PVD (peripheral vascular disease) ([place])     Past Surgical History:  Procedure Laterality Date  . ABDOMINAL HYSTERECTOMY    . BREAST BIOPSY Left 12/21/2009   positive, radiation dcis  . BREAST EXCISIONAL BIOPSY Left 01/17/2010   lumpectomy DCIS  . BREAST LUMPECTOMY    . CATARACT EXTRACTION W/ INTRAOCULAR LENS IMPLANT & ANTERIOR VITRECTOMY, BILATERAL    . CHOLECYSTECTOMY    . COLONOSCOPY WITH PROPOFOL N/A 09/25/2017   Procedure: COLONOSCOPY WITH PROPOFOL;  Surgeon: Lollie Sails, MD;  Location: [facility] ENDOSCOPY;  Service: Endoscopy;  Laterality: N/A;  . DILATION AND CURETTAGE OF UTERUS    . JOINT REPLACEMENT Right 01/24/2016  . KNEE ARTHROPLASTY Right 01/24/2016   Procedure: COMPUTER ASSISTED TOTAL KNEE ARTHROPLASTY;  Surgeon: Dereck Leep, MD;  Location: [facility] ORS;  Service: Orthopedics;  Laterality: Right;  . stent in  Lt leg Left     Social History Social History   Tobacco Use  . Smoking status: Former Research scientist (life sciences)  . Smokeless tobacco: Never Used  Substance Use Topics  . Alcohol use: No    Alcohol/week: 0.0 standard drinks  . Drug use: No    Family History No family history on file.  Allergies  Allergen Reactions  . Ibuprofen Hives  . Aleve [Naproxen Sodium] Hives  . Avapro  [Irbesartan] Rash    Other reaction(s):  Weal  . Azithromycin Hives  . Nickel Itching    Redness, itching and fluid discharge with nickel earrings  . Other Other (See Comments)    Nickel"ears ran fluid and itching" redness and "sensitive" Nickel"ears ran fluid and itching"     REVIEW OF SYSTEMS (Negative unless checked)  Constitutional: [] Weight loss  [] Fever  [] Chills Cardiac: [] Chest pain   [] Chest pressure   [] Palpitations   [] Shortness of breath when laying flat   [] Shortness of breath with exertion. Vascular:  [x] Pain in legs with walking   [] Pain in legs at rest  [] History of DVT   [] Phlebitis   [] Swelling in legs   [] Varicose veins   [] Non-healing ulcers Pulmonary:   [] Uses home oxygen   [] Productive cough   [] Hemoptysis   [] Wheeze  [] COPD   [] Asthma Neurologic:  [] Dizziness   [] Seizures   [] History of stroke   [] History of TIA  [] Aphasia   [] Vissual changes   [] Weakness or numbness in arm   [] Weakness or numbness in leg Musculoskeletal:   [] Joint swelling   [x] Joint pain   [] Low back pain Hematologic:  [] Easy bruising  [] Easy bleeding   [] Hypercoagulable state   [] Anemic Gastrointestinal:  [] Diarrhea   [] Vomiting  [] Gastroesophageal reflux/heartburn   [] Difficulty swallowing. Genitourinary:  [] Chronic kidney disease   [] Difficult urination  [] Frequent urination   [] Blood in urine Skin:  [] Rashes   [] Ulcers  Psychological:  [] History of anxiety   []  History of major depression.  Physical Examination  Vitals:   11/25/19 1417  BP: (!) 146/70  Pulse: 78  Weight: 134 lb (60.8 kg)  Height: 5' (1.524 m)   Body mass index is 26.17 kg/m. Gen: WD/WN, NAD Head: [place]/AT, No temporalis wasting.  Ear/Nose/Throat: Hearing grossly intact, nares w/o erythema or drainage Eyes: PER, EOMI, sclera nonicteric.  Neck: Supple, no large masses.   Pulmonary:  Good air movement, no audible wheezing bilaterally, no use of accessory muscles.  Cardiac: RRR, no JVD Vascular: no carotid bruits noted Vessel Right Left  Radial Palpable  Palpable  PT Palpable Palpable  DP Palpable Palpable  Gastrointestinal: Non-distended. No guarding/no peritoneal signs.  Musculoskeletal: M/S 5/5 throughout.  No deformity or atrophy.  Neurologic: CN 2-12 intact. Symmetrical.  Speech is fluent. Motor exam as listed above. Psychiatric: Judgment intact, Mood & affect appropriate for pt's clinical situation. Dermatologic: No rashes or ulcers noted.  No changes consistent with cellulitis.   CBC Lab Results  Component Value Date   WBC 5.0 07/11/2016   HGB 13.1 07/11/2016   HCT 35.8 07/11/2016   MCV 98.7 07/11/2016   PLT 135 (L) 07/11/2016    BMET    Component Value Date/Time   NA 138 01/26/2016 0418   K 3.4 (L) 01/26/2016 0418   CL 105 01/26/2016 0418   CO2 27 01/26/2016 0418   GLUCOSE 111 (H) 01/26/2016 0418   BUN 14 01/26/2016 0418   CREATININE 0.78 01/26/2016 0418   CALCIUM 8.5 (L) 01/26/2016 0418   GFRNONAA >60 01/26/2016  [place] >60 01/26/2016 0418   CrCl cannot be calculated (Patient's most recent lab result is older than the maximum 21 days allowed.).  COAG Lab Results  Component Value Date   INR 1.02 01/10/2016    Radiology VAS [place] ABI WITH/WO TBI  Result Date: 11/25/2019 LOWER EXTREMITY DOPPLER STUDY Indications: Peripheral artery disease.  Vascular Interventions: Left SFA stent many years ago. Comparison Study: 11/2018 Performing Technologist: Concha Norway RVT  Examination Guidelines: A complete evaluation includes at minimum, Doppler waveform signals and systolic blood pressure reading at the level of bilateral brachial, anterior tibial, and posterior tibial arteries, when vessel segments are accessible. Bilateral testing is considered an integral part of a complete examination. Photoelectric Plethysmograph (PPG) waveforms and toe systolic pressure readings are included as required and additional duplex testing as needed. Limited examinations for reoccurring indications may be performed as noted.  ABI Findings:  +---------+------------------+-----+---------+--------+ Right    Rt Pressure (mmHg)IndexWaveform Comment  +---------+------------------+-----+---------+--------+ Brachial 138                                      +---------+------------------+-----+---------+--------+ ATA      134               0.97 triphasic         +---------+------------------+-----+---------+--------+ PTA      153               1.11 triphasic         +---------+------------------+-----+---------+--------+ Great Toe98                0.71 Normal            +---------+------------------+-----+---------+--------+ +---------+------------------+-----+---------+-------+ Left     Lt Pressure (mmHg)IndexWaveform Comment +---------+------------------+-----+---------+-------+ Brachial 135                                     +---------+------------------+-----+---------+-------+ ATA      147               1.07 triphasic        +---------+------------------+-----+---------+-------+ PTA      162               1.17 triphasic        +---------+------------------+-----+---------+-------+ Great Toe120               0.87 Normal           +---------+------------------+-----+---------+-------+ +-------+-----------+-----------+------------+------------+ ABI/TBIToday's ABIToday's TBIPrevious ABIPrevious TBI +-------+-----------+-----------+------------+------------+ Right  1.11       .71        .89         .54          +-------+-----------+-----------+------------+------------+ Left   1.17       .87        .94         .78/         +-------+-----------+-----------+------------+------------+ Bilateral ABIs and TBIs appear increased compared to prior study on 11/2018.  Summary: Right: Resting right ankle-brachial index is within normal range. No evidence of significant right lower extremity arterial disease. The right toe-brachial index is normal. Left: Resting left ankle-brachial index is  within normal range. No evidence of significant left lower extremity arterial disease. The left toe-brachial index is normal.  *See table(s) above for measurements and observations.  Electronically signed by Hortencia Pilar  MD on 11/25/2019 at 3:43:51 PM.    Final    VAS US CAROTID  Result Date: 11/25/2019 Carotid Arterial Duplex Study Indications:       Carotid artery disease. Comparison Study:  11/2018 Performing Technologist: Concha Norway RVT  Examination Guidelines: A complete evaluation includes B-mode imaging, spectral Doppler, color Doppler, and power Doppler as needed of all accessible portions of each vessel. Bilateral testing is considered an integral part of a complete examination. Limited examinations for reoccurring indications may be performed as noted.  Right Carotid Findings: +----------+--------+--------+--------+------------------+--------+           PSV cm/sEDV cm/sStenosisPlaque DescriptionComments +----------+--------+--------+--------+------------------+--------+ CCA Prox  152     16                                         +----------+--------+--------+--------+------------------+--------+ CCA Mid   86      21                                         +----------+--------+--------+--------+------------------+--------+ CCA Distal98      18                                         +----------+--------+--------+--------+------------------+--------+ ICA Prox  79      16      1-39%   calcific                   +----------+--------+--------+--------+------------------+--------+ ICA Mid   90      22                                         +----------+--------+--------+--------+------------------+--------+ ICA Distal90      15                                         +----------+--------+--------+--------+------------------+--------+ ECA       133     5                                           +----------+--------+--------+--------+------------------+--------+ +----------+--------+-------+----------------+-------------------+           PSV cm/sEDV cmsDescribe        Arm Pressure (mmHG) +----------+--------+-------+----------------+-------------------+ [id number]            Multiphasic, WNL                    +----------+--------+-------+----------------+-------------------+ +---------+--------+--+--------+---------+ VertebralPSV cm/s39EDV cm/sAntegrade +---------+--------+--+--------+---------+  Left Carotid Findings: +----------+--------+--------+--------+------------------+--------+           PSV cm/sEDV cm/sStenosisPlaque DescriptionComments +----------+--------+--------+--------+------------------+--------+ CCA Prox  61      12                                         +----------+--------+--------+--------+------------------+--------+ CCA Mid   80  17                                         +----------+--------+--------+--------+------------------+--------+ CCA Distal78      14                                         +----------+--------+--------+--------+------------------+--------+ ICA Prox  56      15      1-39%   heterogenous               +----------+--------+--------+--------+------------------+--------+ ICA Mid   58      14                                         +----------+--------+--------+--------+------------------+--------+ ICA Distal54      15                                         +----------+--------+--------+--------+------------------+--------+ ECA       95      4                                          +----------+--------+--------+--------+------------------+--------+ +----------+--------+--------+----------------+-------------------+           PSV cm/sEDV cm/sDescribe        Arm Pressure (mmHG) +----------+--------+--------+----------------+-------------------+ [id number]              Multiphasic, WNL                    +----------+--------+--------+----------------+-------------------+ +---------+--------+--+--------+---------+ VertebralPSV cm/s72EDV cm/sAntegrade +---------+--------+--+--------+---------+   Summary: Right Carotid: Velocities in the right ICA are consistent with a 1-39% stenosis.                Mild calcified plaque in the bukb. Tortuous ICA. Left Carotid: Velocities in the left ICA are consistent with a 1-39% stenosis.               Mild heterogeneous plaque in the bulb. Vertebrals:  Bilateral vertebral arteries demonstrate antegrade flow. Subclavians: Normal flow hemodynamics were seen in bilateral subclavian              arteries. *See table(s) above for measurements and observations.  Electronically signed by Hortencia Pilar MD on 11/25/2019 at 3:43:54 PM.    Final      Assessment/Plan 1. PAD (peripheral artery disease) (HCC) Recommend:  The patient has evidence of atherosclerosis of the lower extremities with claudication.  The patient does not voice lifestyle limiting changes at this point in time.  Noninvasive studies do not suggest clinically significant change.  No invasive studies, angiography or surgery at this time The patient should continue walking and begin a more formal exercise program.  The patient should continue antiplatelet therapy and aggressive treatment of the lipid abnormalities  No changes in the patient's medications at this time  The patient should continue wearing graduated compression socks 10-15 mmHg strength to control the mild edema.  - VAS [place] ABI WITH/WO TBI; Future  2. Bilateral carotid artery stenosis  Recommend:  Given the patient's asymptomatic subcritical stenosis no further invasive testing or surgery at this time.  Duplex ultrasound shows <40% stenosis bilaterally.  Continue antiplatelet therapy as prescribed Continue management of CAD, HTN and Hyperlipidemia Healthy heart diet,  encouraged  exercise at least 4 times per week Follow up in 12 months with duplex ultrasound and physical exam  - VAS US CAROTID; Future  3. Essential hypertension Continue antihypertensive medications as already ordered, these medications have been reviewed and there are no changes at this time.   4. Primary osteoarthritis of both knees Continue NSAID medications as already ordered, these medications have been reviewed and there are no changes at this time.  Continued activity and therapy was stressed.   5. Mixed hyperlipidemia Continue statin as ordered and reviewed, no changes at this time     Hortencia Pilar, MD  11/25/2019 3:44 PM

## 2019-11-26 ENCOUNTER — Encounter (INDEPENDENT_AMBULATORY_CARE_PROVIDER_SITE_OTHER): Payer: Self-pay | Admitting: Vascular Surgery

## 2020-04-20 ENCOUNTER — Other Ambulatory Visit: Payer: Self-pay | Admitting: Family Medicine

## 2020-04-20 DIAGNOSIS — Z1231 Encounter for screening mammogram for malignant neoplasm of breast: Secondary | ICD-10-CM

## 2020-04-30 ENCOUNTER — Other Ambulatory Visit: Payer: Self-pay

## 2020-04-30 ENCOUNTER — Observation Stay: Payer: Medicare Other

## 2020-04-30 ENCOUNTER — Emergency Department: Payer: Medicare Other

## 2020-04-30 ENCOUNTER — Observation Stay
Admission: EM | Admit: 2020-04-30 | Discharge: 2020-05-01 | Disposition: A | Discharge status: Home / Self Care | Payer: Medicare Other | Attending: Internal Medicine | Admitting: Internal Medicine

## 2020-04-30 DIAGNOSIS — R079 Chest pain, unspecified: Secondary | ICD-10-CM | POA: Diagnosis present

## 2020-04-30 DIAGNOSIS — E785 Hyperlipidemia, unspecified: Secondary | ICD-10-CM | POA: Diagnosis not present

## 2020-04-30 DIAGNOSIS — D0510 Intraductal carcinoma in situ of unspecified breast: Secondary | ICD-10-CM | POA: Diagnosis present

## 2020-04-30 DIAGNOSIS — R7989 Other specified abnormal findings of blood chemistry: Secondary | ICD-10-CM | POA: Diagnosis present

## 2020-04-30 DIAGNOSIS — I214 Non-ST elevation (NSTEMI) myocardial infarction: Principal | ICD-10-CM | POA: Diagnosis present

## 2020-04-30 DIAGNOSIS — Z79899 Other long term (current) drug therapy: Secondary | ICD-10-CM | POA: Diagnosis not present

## 2020-04-30 DIAGNOSIS — Z87891 Personal history of nicotine dependence: Secondary | ICD-10-CM | POA: Insufficient documentation

## 2020-04-30 DIAGNOSIS — R778 Other specified abnormalities of plasma proteins: Secondary | ICD-10-CM | POA: Diagnosis not present

## 2020-04-30 DIAGNOSIS — I739 Peripheral vascular disease, unspecified: Secondary | ICD-10-CM | POA: Diagnosis present

## 2020-04-30 DIAGNOSIS — Z853 Personal history of malignant neoplasm of breast: Secondary | ICD-10-CM | POA: Diagnosis not present

## 2020-04-30 DIAGNOSIS — Z880 Allergy status to penicillin: Secondary | ICD-10-CM | POA: Diagnosis not present

## 2020-04-30 DIAGNOSIS — I10 Essential (primary) hypertension: Secondary | ICD-10-CM | POA: Diagnosis present

## 2020-04-30 DIAGNOSIS — I700 Atherosclerosis of aorta: Secondary | ICD-10-CM | POA: Insufficient documentation

## 2020-04-30 DIAGNOSIS — Z20822 Contact with and (suspected) exposure to covid-19: Secondary | ICD-10-CM | POA: Diagnosis not present

## 2020-04-30 LAB — BASIC METABOLIC PANEL
Anion gap: 10 (ref 5–15)
BUN: 8 mg/dL (ref 8–23)
CO2: 25 mmol/L (ref 22–32)
Calcium: 9.2 mg/dL (ref 8.9–10.3)
Chloride: 104 mmol/L (ref 98–111)
Creatinine, Ser: 0.57 mg/dL (ref 0.44–1.00)
GFR, Estimated: >60 mL/min (ref >60)
Glucose, Bld: 107 mg/dL — ABNORMAL HIGH (ref 70–99)
Potassium: 3.7 mmol/L (ref 3.5–5.1)
Sodium: 139 mmol/L (ref 135–145)

## 2020-04-30 LAB — CBC
HCT: 37.1 % (ref 36.0–46.0)
Hemoglobin: 13 g/dL (ref 12.0–15.0)
MCH: 32.7 pg (ref 26.0–34.0)
MCHC: 35 g/dL (ref 30.0–36.0)
MCV: 93.2 fL (ref 80.0–100.0)
Platelets: 213 10*3/uL (ref 150–400)
RBC: 3.98 MIL/uL (ref 3.87–5.11)
RDW: 13.3 % (ref 11.5–15.5)
WBC: 7.9 10*3/uL (ref 4.0–10.5)
nRBC: 0 % (ref 0.0–0.2)

## 2020-04-30 LAB — APTT: aPTT: 27 seconds (ref 24–36)

## 2020-04-30 LAB — TROPONIN I (HIGH SENSITIVITY)
Troponin I (High Sensitivity): 19201 ng/L (ref <18)
Troponin I (High Sensitivity): 14653 ng/L (ref <18)

## 2020-04-30 LAB — MAGNESIUM: Magnesium: 1.8 mg/dL (ref 1.7–2.4)

## 2020-04-30 LAB — PROTIME-INR
INR: 1.1 (ref 0.8–1.2)
Prothrombin Time: 13.7 seconds (ref 11.4–15.2)

## 2020-04-30 LAB — HEPARIN LEVEL (UNFRACTIONATED): Heparin Unfractionated: <0.1 IU/mL — ABNORMAL LOW (ref 0.30–0.70)

## 2020-04-30 LAB — SARS CORONAVIRUS 2 (TAT 6-24 HRS): SARS Coronavirus 2: NEGATIVE

## 2020-04-30 LAB — BRAIN NATRIURETIC PEPTIDE: B Natriuretic Peptide: 646.9 pg/mL — ABNORMAL HIGH (ref 0.0–100.0)

## 2020-04-30 MED ORDER — ACETAMINOPHEN 650 MG RE SUPP: 650.0000 mg | Freq: Four times a day (QID) | RECTAL | Status: DC | PRN

## 2020-04-30 MED ORDER — ONDANSETRON HCL 4 MG PO TABS: 4.0000 mg | ORAL_TABLET | Freq: Four times a day (QID) | ORAL | Status: DC | PRN

## 2020-04-30 MED ORDER — CARVEDILOL 3.125 MG PO TABS
3.1250 mg | ORAL_TABLET | Freq: Two times a day (BID) | ORAL | Status: DC
  Administered 2020-04-30: 3.125 mg via ORAL
  Filled 2020-04-30: qty 1

## 2020-04-30 MED ORDER — ACETAMINOPHEN 325 MG PO TABS: 650.0000 mg | ORAL_TABLET | Freq: Four times a day (QID) | ORAL | Status: DC | PRN

## 2020-04-30 MED ORDER — LACTATED RINGERS IV SOLN: INTRAVENOUS | Status: AC

## 2020-04-30 MED ORDER — ONDANSETRON HCL 4 MG/2ML IJ SOLN: 4.0000 mg | Freq: Four times a day (QID) | INTRAMUSCULAR | Status: DC | PRN

## 2020-04-30 MED ORDER — NITROGLYCERIN 0.4 MG SL SUBL: 0.4000 mg | SUBLINGUAL_TABLET | SUBLINGUAL | Status: DC | PRN

## 2020-04-30 MED ORDER — ASPIRIN EC 81 MG PO TBEC
81.0000 mg | DELAYED_RELEASE_TABLET | Freq: Every day | ORAL | Status: DC
  Administered 2020-05-01: 81 mg via ORAL

## 2020-04-30 MED ORDER — DIPHENHYDRAMINE-ZINC ACETATE 1-0.1 % EX CREA: 1.0000 g | TOPICAL_CREAM | Freq: Every day | CUTANEOUS | Status: DC | PRN

## 2020-04-30 MED ORDER — NITROGLYCERIN 2 % TD OINT: 1.0000 [in_us] | TOPICAL_OINTMENT | Freq: Four times a day (QID) | TRANSDERMAL | Status: DC

## 2020-04-30 MED ORDER — AMLODIPINE BESYLATE 10 MG PO TABS
10.0000 mg | ORAL_TABLET | Freq: Every day | ORAL | Status: DC
  Administered 2020-04-30: 10 mg via ORAL
  Filled 2020-04-30: qty 2

## 2020-04-30 MED ORDER — HEPARIN BOLUS VIA INFUSION
3500.0000 [IU] | Freq: Once | INTRAVENOUS | Status: AC
  Administered 2020-04-30: 3500 [IU] via INTRAVENOUS
  Filled 2020-04-30: qty 3500

## 2020-04-30 MED ORDER — POTASSIUM CHLORIDE CRYS ER 20 MEQ PO TBCR
30.0000 meq | EXTENDED_RELEASE_TABLET | Freq: Once | ORAL | Status: AC
  Administered 2020-04-30: 30 meq via ORAL
  Filled 2020-04-30: qty 2

## 2020-04-30 MED ORDER — ATENOLOL 25 MG PO TABS: 100.0000 mg | ORAL_TABLET | Freq: Every day | ORAL | Status: DC

## 2020-04-30 MED ORDER — HEPARIN (PORCINE) 25000 UT/250ML-% IV SOLN
950.0000 [IU]/h | INTRAVENOUS | Status: DC
  Administered 2020-04-30: 700 [IU]/h via INTRAVENOUS
  Filled 2020-04-30: qty 250

## 2020-04-30 MED ORDER — ASPIRIN 81 MG PO CHEW
324.0000 mg | CHEWABLE_TABLET | Freq: Once | ORAL | Status: AC
  Administered 2020-04-30: 324 mg via ORAL
  Filled 2020-04-30: qty 4

## 2020-04-30 MED ORDER — MORPHINE SULFATE (PF) 2 MG/ML IV SOLN
1.0000 mg | INTRAVENOUS | Status: DC | PRN
Start: 2020-04-30 — End: 2020-05-01

## 2020-04-30 MED ORDER — ATORVASTATIN CALCIUM 20 MG PO TABS
40.0000 mg | ORAL_TABLET | Freq: Every day | ORAL | Status: DC
  Administered 2020-04-30: 40 mg via ORAL
  Filled 2020-04-30: qty 2

## 2020-04-30 MED ORDER — IOHEXOL 350 MG/ML SOLN
75.0000 mL | Freq: Once | INTRAVENOUS | Status: AC | PRN
  Administered 2020-04-30: 75 mL via INTRAVENOUS

## 2020-04-30 NOTE — ED Triage Notes (Signed)
Pt c/o substernal chest pain that radiating into BL arms with nausea and periods of diaphoresis since midnight last night.

## 2020-04-30 NOTE — Consult Note (Signed)
Cardiology Consultation:   Patient ID: [name] MRN: [id number]; DOB: Oct 10, 1940  Admit date: 04/30/2020 Date of Consult: 04/30/2020  PCP:  Derinda Late, MD   [place]  Cardiologist:  No primary care provider on file. new Advanced Practice Provider:  No care team member to display Electrophysiologist:  None   Patient Profile:   [name] is a 80 y.o. female with a hx of peripheral vascular disease who is being seen today for the evaluation of chest pain and new LBBB at the request of Dr. Tamala Julian.  History of Present Illness:   [name] has had exertional angina for several weeks. Last night she developed SSCP, with nausea, vomiting, radiation into the arm. The pain persisted until about 0800 this morning and she presents for evaluation. She denies sob and no edema. She has not had syncope.    Past Medical History:  Diagnosis Date  . Arthritis   . Breast cancer ([place]) 2011   radiation- Left  . Hypertension   . Personal history of radiation therapy   . PVD (peripheral vascular disease) ([place])     Past Surgical History:  Procedure Laterality Date  . ABDOMINAL HYSTERECTOMY    . BREAST BIOPSY Left 12/21/2009   positive, radiation dcis  . BREAST EXCISIONAL BIOPSY Left 01/17/2010   lumpectomy DCIS  . BREAST LUMPECTOMY    . CATARACT EXTRACTION W/ INTRAOCULAR LENS IMPLANT & ANTERIOR VITRECTOMY, BILATERAL    . CHOLECYSTECTOMY    . COLONOSCOPY WITH PROPOFOL N/A 09/25/2017   Procedure: COLONOSCOPY WITH PROPOFOL;  Surgeon: Lollie Sails, MD;  Location: [facility] ENDOSCOPY;  Service: Endoscopy;  Laterality: N/A;  . DILATION AND CURETTAGE OF UTERUS    . JOINT REPLACEMENT Right 01/24/2016  . KNEE ARTHROPLASTY Right 01/24/2016   Procedure: COMPUTER ASSISTED TOTAL KNEE ARTHROPLASTY;  Surgeon: Dereck Leep, MD;  Location: [facility] ORS;  Service: Orthopedics;  Laterality: Right;  . stent in Lt leg Left      Home Medications:  Prior to Admission  medications   Medication Sig Start Date End Date Taking? Authorizing Provider  acetaminophen (TYLENOL) 500 MG tablet Take 1,000 mg by mouth every 6 (six) hours as needed for mild pain.   Yes [provider]  diphenhydrAMINE (BENADRYL) 50 MG tablet Take 0.5 tablets (25 mg total) by mouth every 6 (six) hours as needed for itching. 01/12/17  Yes Schuyler Amor, MD  diphenhydrAMINE-zinc acetate (BENADRYL) cream Apply 1 g topically daily as needed for itching.   Yes [provider]  aliskiren (TEKTURNA) 300 MG tablet TAKE ONE TABLET BY MOUTH ONCE DAILY Patient not taking: No sig reported 06/29/14   [provider]  amLODipine (NORVASC) 10 MG tablet Take 10 mg by mouth daily. 07/14/14   [provider]  aspirin EC 81 MG tablet Take 81 mg by mouth daily.    [provider]  atenolol (TENORMIN) 100 MG tablet Take 100 mg by mouth daily. 10/10/14   [provider]  Calcium Carbonate-Vit D-Min (CALTRATE 600+D PLUS PO) Take 1 tablet by mouth daily.     [provider]  GLUCOSAMINE-CHONDROITIN PO Take 2 tablets by mouth daily. Patient not taking: No sig reported    [provider]  hydrochlorothiazide (HYDRODIURIL) 25 MG tablet Take 25 mg by mouth daily. 10/10/14   [provider]  Multiple Vitamin (MULTI-VITAMINS) TABS Take 1 tablet by mouth daily.     [provider]  potassium chloride SA (KLOR-CON) 20 MEQ  tablet 20 mEq daily. 08/05/14   [provider]  simvastatin (ZOCOR) 10 MG tablet Take 10 mg by mouth daily at 6 PM.  06/06/14   [provider]  timolol (TIMOPTIC) 0.5 % ophthalmic solution Place 1 drop into both eyes daily.  Patient not taking: Reported on 04/30/2020    [provider]    Inpatient Medications: Scheduled Meds:  Continuous Infusions:  PRN Meds:   Allergies:    Allergies  Allergen Reactions  . Ibuprofen Hives  . Penicillins Hives  . Aleve [Naproxen Sodium] Hives   . Avapro  [Irbesartan] Rash    Other reaction(s): Weal  . Azithromycin Hives  . Nickel Itching    Redness, itching and fluid discharge with nickel earrings  . Other Other (See Comments)    Nickel"ears ran fluid and itching" redness and "sensitive" Nickel"ears ran fluid and itching"    Social History:   Social History   Socioeconomic History  . Marital status: Married    Spouse name: Not on file  . Number of children: Not on file  . Years of education: Not on file  . Highest education level: Not on file  Occupational History  . Not on file  Tobacco Use  . Smoking status: Former Research scientist (life sciences)  . Smokeless tobacco: Never Used  Substance and Sexual Activity  . Alcohol use: No    Alcohol/week: 0.0 standard drinks  . Drug use: No  . Sexual activity: Not on file  Other Topics Concern  . Not on file  Social History Narrative  . Not on file   Social Determinants of Health   Financial Resource Strain: Not on file  Food Insecurity: Not on file  Transportation Needs: Not on file  Physical Activity: Not on file  Stress: Not on file  Social Connections: Not on file  Intimate Partner Violence: Not on file    Family History:   No family history on file.   ROS:  Please see the history of present illness.   All other ROS reviewed and negative.     Physical Exam/Data:   Vitals:   04/30/20 1036 04/30/20 1038 04/30/20 1039 04/30/20 1041  BP:    (!) (P) 155/68  Pulse:   78   Resp:   17   Temp: 97.9 F (36.6 C)     TempSrc: Oral     SpO2:   98%   Weight:  59.4 kg    Height:  5' (1.524 m)     No intake or output data in the 24 hours ending 04/30/20 1219 Last 3 Weights 04/30/2020 11/25/2019 11/16/2018  Weight (lbs) 131 lb 134 lb 141 lb 9.6 oz  Weight (kg) 59.421 kg 60.782 kg 64.229 kg     Body mass index is 25.58 kg/m.  General:  Well nourished, well developed, in no acute distress HEENT: normal Lymph: no adenopathy Neck: no JVD Endocrine:  No thryomegaly Vascular: No  carotid bruits; FA pulses 2+ bilaterally without bruits  Cardiac:  normal S1, S2; RRR; no murmur  Lungs:  clear to auscultation bilaterally, no wheezing, rhonchi or rales  Abd: soft, nontender, no hepatomegaly  Ext: no edema Musculoskeletal:  No deformities, BUE and BLE strength normal and equal Skin: warm and dry  Neuro:  CNs 2-12 intact, no focal abnormalities noted Psych:  Normal affect   EKG:  The EKG was personally reviewed and demonstrates:  nsr with LBBB Telemetry:  Telemetry was personally reviewed and demonstrates:  nsr  Relevant CV Studies: none  Laboratory Data: Pending  High Sensitivity Troponin:  No results for input(s): TROPONINIHS in the last 720 hours.   ChemistryNo results for input(s): NA, K, CL, CO2, GLUCOSE, BUN, CREATININE, CALCIUM, GFRNONAA, GFRAA, ANIONGAP in the last 168 hours.  No results for input(s): PROT, ALBUMIN, AST, ALT, ALKPHOS, BILITOT in the last 168 hours. HematologyNo results for input(s): WBC, RBC, HGB, HCT, MCV, MCH, MCHC, RDW, PLT in the last 168 hours. BNPNo results for input(s): BNP, PROBNP in the last 168 hours.  DDimer No results for input(s): DDIMER in the last 168 hours.   Radiology/Studies:  DG Chest 2 View  Result Date: 04/30/2020 CLINICAL DATA:  Chest pain EXAM: CHEST - 2 VIEW COMPARISON:  None. FINDINGS: There is a 6 x 6.6 x 6.2 cm mass in the posterior left mid lung. There is no pleural effusion. The right lung is clear. The mediastinal contour and cardiac silhouette are normal. Degenerative joint changes of the spine are identified. IMPRESSION: Mass in the posterior left mid lung. Further evaluation with chest CT is recommended. Electronically Signed   By: Abelardo Diesel M.D.   On: 04/30/2020 11:58     Assessment and Plan:   1. Probable STEMI/NSTEMI, now pain free - Her labs are pending but as she is pain free, I would recommend she be admitted and started on ASA, IV heparin, beta blocker and statin, and if she has any more pain,  emergent PCI. Left heart cath tomorrow if her symptoms remain quiet. I expect her troponin to be elevated. She does not have any CHF symptoms.  2. Peripheral vascular disease - she is s/p stenting of her left leg.       For questions or updates, please contact [place] Please consult [email or web address] for contact info under   Signed, Cristopher Peru, MD  04/30/2020 12:19 PM

## 2020-04-30 NOTE — Progress Notes (Addendum)
   Shared Decision Making/Informed Consent{ Per MD, patient consented for LHC.  The risks [stroke (1 in 1000), death (1 in 16), kidney failure [usually temporary] (1 in 500), bleeding (1 in 200), allergic reaction [possibly serious] (1 in 200)], benefits (diagnostic support and management of coronary artery disease) and alternatives of a cardiac catheterization were discussed in detail with [name] by the rounding MD and she is willing to proceed.  Orders placed. [place] cardiology team informed of plan for [facility] 05/01/20. Case written on cath board as weekend add on.  Of note, DNR status at this time.   Signed, Arvil Chaco, PA-C 04/30/2020, 8:32 PM

## 2020-04-30 NOTE — H&P (View-Only) (Signed)
Cardiology Consultation:   Patient ID: [name] MRN: [id number]; DOB: May 03, 1940  Admit date: 04/30/2020 Date of Consult: 04/30/2020  PCP:  Derinda Late, MD   [place]  Cardiologist:  No primary care provider on file. new Advanced Practice Provider:  No care team member to display Electrophysiologist:  None   Patient Profile:   [name] is a 80 y.o. female with a hx of peripheral vascular disease who is being seen today for the evaluation of chest pain and new LBBB at the request of Dr. Tamala Julian.  History of Present Illness:   [name] has had exertional angina for several weeks. Last night she developed SSCP, with nausea, vomiting, radiation into the arm. The pain persisted until about 0800 this morning and she presents for evaluation. She denies sob and no edema. She has not had syncope.    Past Medical History:  Diagnosis Date  . Arthritis   . Breast cancer ([place]) 2011   radiation- Left  . Hypertension   . Personal history of radiation therapy   . PVD (peripheral vascular disease) ([place])     Past Surgical History:  Procedure Laterality Date  . ABDOMINAL HYSTERECTOMY    . BREAST BIOPSY Left 12/21/2009   positive, radiation dcis  . BREAST EXCISIONAL BIOPSY Left 01/17/2010   lumpectomy DCIS  . BREAST LUMPECTOMY    . CATARACT EXTRACTION W/ INTRAOCULAR LENS IMPLANT & ANTERIOR VITRECTOMY, BILATERAL    . CHOLECYSTECTOMY    . COLONOSCOPY WITH PROPOFOL N/A 09/25/2017   Procedure: COLONOSCOPY WITH PROPOFOL;  Surgeon: Lollie Sails, MD;  Location[facility] ENDOSCOPY;  Service: Endoscopy;  Laterality: N/A;  . DILATION AND CURETTAGE OF UTERUS    . JOINT REPLACEMENT Right 01/24/2016  . KNEE ARTHROPLASTY Right 01/24/2016   Procedure: COMPUTER ASSISTED TOTAL KNEE ARTHROPLASTY;  Surgeon: Dereck Leep, MD;  Location: [facility] ORS;  Service: Orthopedics;  Laterality: Right;  . stent in Lt leg Left      Home Medications:  Prior to Admission  medications   Medication Sig Start Date End Date Taking? Authorizing Provider  acetaminophen (TYLENOL) 500 MG tablet Take 1,000 mg by mouth every 6 (six) hours as needed for mild pain.   Yes [provider]  diphenhydrAMINE (BENADRYL) 50 MG tablet Take 0.5 tablets (25 mg total) by mouth every 6 (six) hours as needed for itching. 01/12/17  Yes Schuyler Amor, MD  diphenhydrAMINE-zinc acetate (BENADRYL) cream Apply 1 g topically daily as needed for itching.   Yes [provider]  aliskiren (TEKTURNA) 300 MG tablet TAKE ONE TABLET BY MOUTH ONCE DAILY Patient not taking: No sig reported 06/29/14   [provider]  amLODipine (NORVASC) 10 MG tablet Take 10 mg by mouth daily. 07/14/14   [provider]  aspirin EC 81 MG tablet Take 81 mg by mouth daily.    [provider]  atenolol (TENORMIN) 100 MG tablet Take 100 mg by mouth daily. 10/10/14   [provider]  Calcium Carbonate-Vit D-Min (CALTRATE 600+D PLUS PO) Take 1 tablet by mouth daily.     [provider]  GLUCOSAMINE-CHONDROITIN PO Take 2 tablets by mouth daily. Patient not taking: No sig reported    [provider]  hydrochlorothiazide (HYDRODIURIL) 25 MG tablet Take 25 mg by mouth daily. 10/10/14   [provider]  Multiple Vitamin (MULTI-VITAMINS) TABS Take 1 tablet by mouth daily.     [provider]  potassium chloride SA (KLOR-CON) 20 MEQ  tablet 20 mEq daily. 08/05/14   [provider]  simvastatin (ZOCOR) 10 MG tablet Take 10 mg by mouth daily at 6 PM.  06/06/14   [provider]  timolol (TIMOPTIC) 0.5 % ophthalmic solution Place 1 drop into both eyes daily.  Patient not taking: Reported on 04/30/2020    [provider]    Inpatient Medications: Scheduled Meds:  Continuous Infusions:  PRN Meds:   Allergies:    Allergies  Allergen Reactions  . Ibuprofen Hives  . Penicillins Hives  . Aleve [Naproxen Sodium] Hives   . Avapro  [Irbesartan] Rash    Other reaction(s): Weal  . Azithromycin Hives  . Nickel Itching    Redness, itching and fluid discharge with nickel earrings  . Other Other (See Comments)    Nickel"ears ran fluid and itching" redness and "sensitive" Nickel"ears ran fluid and itching"    Social History:   Social History   Socioeconomic History  . Marital status: Married    Spouse name: Not on file  . Number of children: Not on file  . Years of education: Not on file  . Highest education level: Not on file  Occupational History  . Not on file  Tobacco Use  . Smoking status: Former Research scientist (life sciences)  . Smokeless tobacco: Never Used  Substance and Sexual Activity  . Alcohol use: No    Alcohol/week: 0.0 standard drinks  . Drug use: No  . Sexual activity: Not on file  Other Topics Concern  . Not on file  Social History Narrative  . Not on file   Social Determinants of Health   Financial Resource Strain: Not on file  Food Insecurity: Not on file  Transportation Needs: Not on file  Physical Activity: Not on file  Stress: Not on file  Social Connections: Not on file  Intimate Partner Violence: Not on file    Family History:   No family history on file.   ROS:  Please see the history of present illness.   All other ROS reviewed and negative.     Physical Exam/Data:   Vitals:   04/30/20 1036 04/30/20 1038 04/30/20 1039 04/30/20 1041  BP:    (!) (P) 155/68  Pulse:   78   Resp:   17   Temp: 97.9 F (36.6 C)     TempSrc: Oral     SpO2:   98%   Weight:  59.4 kg    Height:  5' (1.524 m)     No intake or output data in the 24 hours ending 04/30/20 1219 Last 3 Weights 04/30/2020 11/25/2019 11/16/2018  Weight (lbs) 131 lb 134 lb 141 lb 9.6 oz  Weight (kg) 59.421 kg 60.782 kg 64.229 kg     Body mass index is 25.58 kg/m.  General:  Well nourished, well developed, in no acute distress HEENT: normal Lymph: no adenopathy Neck: no JVD Endocrine:  No thryomegaly Vascular: No  carotid bruits; FA pulses 2+ bilaterally without bruits  Cardiac:  normal S1, S2; RRR; no murmur  Lungs:  clear to auscultation bilaterally, no wheezing, rhonchi or rales  Abd: soft, nontender, no hepatomegaly  Ext: no edema Musculoskeletal:  No deformities, BUE and BLE strength normal and equal Skin: warm and dry  Neuro:  CNs 2-12 intact, no focal abnormalities noted Psych:  Normal affect   EKG:  The EKG was personally reviewed and demonstrates:  nsr with LBBB Telemetry:  Telemetry was personally reviewed and demonstrates:  nsr  Relevant CV Studies: none  Laboratory Data: Pending  High Sensitivity Troponin:  No results for input(s): TROPONINIHS in the last 720 hours.   ChemistryNo results for input(s): NA, K, CL, CO2, GLUCOSE, BUN, CREATININE, CALCIUM, GFRNONAA, GFRAA, ANIONGAP in the last 168 hours.  No results for input(s): PROT, ALBUMIN, AST, ALT, ALKPHOS, BILITOT in the last 168 hours. HematologyNo results for input(s): WBC, RBC, HGB, HCT, MCV, MCH, MCHC, RDW, PLT in the last 168 hours. BNPNo results for input(s): BNP, PROBNP in the last 168 hours.  DDimer No results for input(s): DDIMER in the last 168 hours.   Radiology/Studies:  DG Chest 2 View  Result Date: 04/30/2020 CLINICAL DATA:  Chest pain EXAM: CHEST - 2 VIEW COMPARISON:  None. FINDINGS: There is a 6 x 6.6 x 6.2 cm mass in the posterior left mid lung. There is no pleural effusion. The right lung is clear. The mediastinal contour and cardiac silhouette are normal. Degenerative joint changes of the spine are identified. IMPRESSION: Mass in the posterior left mid lung. Further evaluation with chest CT is recommended. Electronically Signed   By: Abelardo Diesel M.D.   On: 04/30/2020 11:58     Assessment and Plan:   1. Probable STEMI/NSTEMI, now pain free - Her labs are pending but as she is pain free, I would recommend she be admitted and started on ASA, IV heparin, beta blocker and statin, and if she has any more pain,  emergent PCI. Left heart cath tomorrow if her symptoms remain quiet. I expect her troponin to be elevated. She does not have any CHF symptoms.  2. Peripheral vascular disease - she is s/p stenting of her left leg.       For questions or updates, please contact [place] Please consult [email or web address] for contact info under   Signed, Cristopher Peru, MD  04/30/2020 12:19 PM

## 2020-04-30 NOTE — Consult Note (Signed)
ANTICOAGULATION CONSULT NOTE - Initial Consult  Pharmacy Consult for heparin Indication: chest pain/ACS  Allergies  Allergen Reactions  . Ibuprofen Hives  . Penicillins Hives  . Aleve [Naproxen Sodium] Hives  . Avapro  [Irbesartan] Rash    Other reaction(s): Weal  . Azithromycin Hives  . Nickel Itching    Redness, itching and fluid discharge with nickel earrings  . Other Other (See Comments)    Nickel"ears ran fluid and itching" redness and "sensitive" Nickel"ears ran fluid and itching"    Patient Measurements: Height: 5' (152.4 cm) Weight: 59.4 kg (131 lb) IBW/kg (Calculated) : 45.5 Heparin Dosing Weight: 59.4  Vital Signs: Temp: 97.9 F (36.6 C) (04/17 1036) Temp Source: Oral (04/17 1036) BP: (P) 155/68 (04/17 1041) Pulse Rate: 78 (04/17 1039)  Labs: Recent Labs    04/30/20 1238  HGB 13.0  HCT 37.1  PLT 213  CREATININE 0.57  TROPONINIHS 19,201*    Estimated Creatinine Clearance: 47.5 mL/min (by C-G formula based on SCr of 0.57 mg/dL).   Medical History: Past Medical History:  Diagnosis Date  . Arthritis   . Breast cancer ([place]) 2011   radiation- Left  . Hypertension   . Personal history of radiation therapy   . PVD (peripheral vascular disease) ([facility])     Medications:  No anticoagulants prior to admission per chart review   Assessment: 80 yo F presented with chest pain radiating into arm and diaphoresis. PMH includes PVD and HTN. Pharmacy has been consulted to dose heparin for ACS/chest pain. Planning for heart cath 4/18. [place] [address].   Goal of Therapy:  Heparin level 0.3-0.7 units/ml Monitor platelets by anticoagulation protocol: Yes   Plan:  Give 3500 units bolus x 1 Start heparin infusion at 700 units/hr Check anti-Xa level in 8 hours and daily while on heparin Continue to monitor H&H and platelets  Benn Moulder, PharmD Pharmacy Resident  04/30/2020 1:35 PM

## 2020-04-30 NOTE — H&P (Addendum)
History and Physical   [name] YOV:[id number] DOB: [birth year] DOA: 04/30/2020  PCP: Derinda Late, MD  Outpatient Specialists: Dr. Delana Meyer Patient coming from: home  I have personally briefly reviewed patient's old medical records in [place].  Chief Concern:   HPI: [name] is a 80 y.o. 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  She endorsed that last evening, while doing dishes at 9 pm, she experienced chest pain, 'painful hurt, sharp', 10/10, improved to soreness now, and is a 1/10, bilateral upper arm pain (L>R), that is similar to the chest pain. She also experienced clammy skin, nausea, shortness of breath. She denies vomiting, dysuria, hematuria, chills, fever.   She endorsed dry cough last evening. She states the shortness of breath started yesterday when she was outside with her family.   Initially, this similar symptoms started about two months ago, initially with exertion, climbing up hill from her mail box. However, two months ago, it would go away with rest and last night it did not go away,.   Vaccinations: is not vaccinated for covid 81  Social: She lives by herself. She is retired, previiously worked as an Agricultural consultant for [employer]. Former tobacco user, quit over 20 years ago (2002/2004).  Previously she smoked over 1 pack per day. She denies etoh, recreational drug.   ROS: Constitutional: no weight change, no fever ENT/Mouth: no sore throat, no rhinorrhea Eyes: no eye pain, no vision changes Cardiovascular: + chest pain, + dyspnea,  no edema, no palpitations Respiratory: no cough, no sputum, no wheezing Gastrointestinal:+ nausea, no vomiting, no diarrhea, no constipation Genitourinary: no urinary incontinence, no dysuria, no  hematuria Musculoskeletal: no arthralgias, no myalgias Skin: no skin lesions, no pruritus, Neuro: + weakness, no loss of consciousness, no syncope Psych: no anxiety, no depression, + decrease appetite Heme/Lymph: no bruising, no bleeding  ED Course: Discussed with ED provider, patient requiring hospitalization for NSTEMI.  Vitals in the emergency department show temperature of 97.9, respiration rate of 22, heart rate 90, blood pressure 146/68, patient saturating at 87% on room air.  Labs in the emergency department was remarkable for troponin of 19 201, sodium 139, potassium 3.7, chloride 104, bicarb 25, BUN 8, serum creatinine 0.57, nonfasting blood glucose 107, WBC 7.9, hemoglobin 13, platelets 213.  Chest x-ray portable was done and showed a posterior left mid lung mass.  ED provider ordered heparin bolus and drip per pharmacy, CTA of the chest, and aspirin 324 mg p.o., nitroglycerin sublingual as needed for chest pressure.  STEMI cardiology was consulted and they deferred the consult to interventional cardiology who saw the patient.  Assessment/Plan  Principal Problem:   NSTEMI (non-ST elevated myocardial infarction) ([place]) Active Problems:   DCIS (ductal carcinoma in situ)   PAD (peripheral artery disease) ([facility])   H/O malignant neoplasm of breast   HLD (hyperlipidemia)   Essential hypertension   Elevated troponin   Chest pain with elevated troponin New left bundle branch block NSTEMI/STEMI -Patient does not have chest pain at this time -We will follow troponin -EKG showed V5 to V6 ST depression, concerning for STEMI -ED provider consulted interventional cardiology -Cardiology was also consulted and recommended heparin drip, aspirin, statin, beta-blockade -We will hold home atenolol -Lactated Ringer's 125 ml per hour IV, for 1 day ordered -Started carvedilol 3.125 mg p.o. twice  daily  Elevated troponin-CTA to assess for pulmonary embolism pending -I personally called  pharmacy to ensure that we will start heparin bolus and GTT stat  New QT prolongation-suspect secondary to STEMI, treat as above  Hyperlipidemia-holding home simvastatin 10 mg nightly, started patient on or atorvastatin 40 mg nightly  Hypertension-resumed home amlodipine 10 mg p.o. daily, holding hydrochlorothiazide in anticipation of contrast from CTA and contrast from left heart cath  Posterior left mid lung mass-discussed with family regarding this read, recommended family to follow-up with PCP regarding this mass -Pending CTA -Daughter Helene Kelp and patient endorses understanding and compliance that they will follow-up with their PCP for recommended CT of the chest as appropriate outpatient  A.m. labs: CMP, CBC, TSH, fasting lipid panel, A1c  As needed medications: Ondansetron, acetaminophen, morphine  Chart reviewed.   DVT prophylaxis: Heparin GTT Code Status: DNR Diet: N.p.o. except for ice chips now, n.p.o. after midnight Family Communication: Discussed and updated with daughter, Helene Kelp at bedside Disposition Plan: Pending clinical course Consults called: STEMI cardiology and interventional cardiology Admission status: Observation, progressive cardiac, with telemetry  Past Medical History:  Diagnosis Date  . Arthritis   . Breast cancer ([place]) 2011   radiation- Left  . Hypertension   . Personal history of radiation therapy   . PVD (peripheral vascular disease) ([place])    Past Surgical History:  Procedure Laterality Date  . ABDOMINAL HYSTERECTOMY    . BREAST BIOPSY Left 12/21/2009   positive, radiation dcis  . BREAST EXCISIONAL BIOPSY Left 01/17/2010   lumpectomy DCIS  . BREAST LUMPECTOMY    . CATARACT EXTRACTION W/ INTRAOCULAR LENS IMPLANT & ANTERIOR VITRECTOMY, BILATERAL    . CHOLECYSTECTOMY    . COLONOSCOPY WITH PROPOFOL N/A 09/25/2017   Procedure: COLONOSCOPY WITH PROPOFOL;  Surgeon: Lollie Sails, MD;  Location: [facility] ENDOSCOPY;  Service: Endoscopy;  Laterality:  N/A;  . DILATION AND CURETTAGE OF UTERUS    . JOINT REPLACEMENT Right 01/24/2016  . KNEE ARTHROPLASTY Right 01/24/2016   Procedure: COMPUTER ASSISTED TOTAL KNEE ARTHROPLASTY;  Surgeon: Dereck Leep, MD;  Location: [facility] ORS;  Service: Orthopedics;  Laterality: Right;  . stent in Lt leg Left    Social History:  reports that she has quit smoking. She has never used smokeless tobacco. She reports that she does not drink alcohol and does not use drugs.  Allergies  Allergen Reactions  . Ibuprofen Hives  . Penicillins Hives  . Aleve [Naproxen Sodium] Hives  . Avapro  [Irbesartan] Rash    Other reaction(s): Weal  . Azithromycin Hives  . Nickel Itching    Redness, itching and fluid discharge with nickel earrings  . Other Other (See Comments)    Nickel"ears ran fluid and itching" redness and "sensitive" Nickel"ears ran fluid and itching"   No family history on file. Family history: Family history reviewed and not pertinent  Prior to Admission medications   Medication Sig Start Date End Date Taking? Authorizing Provider  acetaminophen (TYLENOL) 500 MG tablet Take 1,000 mg by mouth every 6 (six) hours as needed for mild pain.   Yes [provider]  diphenhydrAMINE (BENADRYL) 50 MG tablet Take 0.5 tablets (25 mg total) by mouth every 6 (six) hours as needed for itching. 01/12/17  Yes Schuyler Amor, MD  diphenhydrAMINE-zinc acetate (BENADRYL) cream Apply 1 g topically daily as needed for itching.   Yes [provider]  aliskiren (TEKTURNA) 300 MG tablet TAKE ONE TABLET BY MOUTH ONCE DAILY Patient not taking: No sig reported  06/29/14   [provider]  amLODipine (NORVASC) 10 MG tablet Take 10 mg by mouth daily. 07/14/14   [provider]  aspirin EC 81 MG tablet Take 81 mg by mouth daily.    [provider]  atenolol (TENORMIN) 100 MG tablet Take 100 mg by mouth daily. 10/10/14   [provider]  Calcium Carbonate-Vit D-Min (CALTRATE 600+D  PLUS PO) Take 1 tablet by mouth daily.     [provider]  GLUCOSAMINE-CHONDROITIN PO Take 2 tablets by mouth daily. Patient not taking: No sig reported    [provider]  hydrochlorothiazide (HYDRODIURIL) 25 MG tablet Take 25 mg by mouth daily. 10/10/14   [provider]  Multiple Vitamin (MULTI-VITAMINS) TABS Take 1 tablet by mouth daily.     [provider]  potassium chloride SA (KLOR-CON) 20 MEQ tablet 20 mEq daily. 08/05/14   [provider]  simvastatin (ZOCOR) 10 MG tablet Take 10 mg by mouth daily at 6 PM.  06/06/14   [provider]  timolol (TIMOPTIC) 0.5 % ophthalmic solution Place 1 drop into both eyes daily.  Patient not taking: Reported on 04/30/2020    [provider]   Physical Exam: Vitals:   04/30/20 1036 04/30/20 1038 04/30/20 1039 04/30/20 1041  BP:    (!) (P) 155/68  Pulse:   78   Resp:   17   Temp: 97.9 F (36.6 C)     TempSrc: Oral     SpO2:   98%   Weight:  59.4 kg    Height:  5' (1.524 m)     Constitutional: appears age-appropriate, NAD, calm, comfortable Eyes: PERRL, lids and conjunctivae normal ENMT: Mucous membranes are moist. Posterior pharynx clear of any exudate or lesions. Age-appropriate dentition. Hearing appropriate Neck: normal, supple, no masses, no thyromegaly Respiratory: clear to auscultation bilaterally, no wheezing, no crackles. Normal respiratory effort. No accessory muscle use.  Cardiovascular: Regular rate and rhythm, no murmurs / rubs / gallops. No extremity edema. 2+ pedal pulses. No carotid bruits.  Abdomen: no tenderness, no masses palpated, no hepatosplenomegaly. Bowel sounds positive.  Musculoskeletal: no clubbing / cyanosis. No joint deformity upper and lower extremities. Good ROM, no contractures, no atrophy. Normal muscle tone.  Skin: no rashes, lesions, ulcers. No induration Neurologic: Sensation intact. Strength 5/5 in all 4.  Psychiatric: Normal judgment and insight.  Alert and oriented x 3. Normal mood.   EKG: independently reviewed, showing sinus rhythm with rate of 76, new left bundle branch block, QTC 515, V5 to V6 T wave depression  Chest x-ray on Admission: I personally reviewed and I agree with radiologist reading as below.  DG Chest 2 View  Result Date: 04/30/2020 CLINICAL DATA:  Chest pain EXAM: CHEST - 2 VIEW COMPARISON:  None. FINDINGS: There is a 6 x 6.6 x 6.2 cm mass in the posterior left mid lung. There is no pleural effusion. The right lung is clear. The mediastinal contour and cardiac silhouette are normal. Degenerative joint changes of the spine are identified. IMPRESSION: Mass in the posterior left mid lung. Further evaluation with chest CT is recommended. Electronically Signed   By: Abelardo Diesel M.D.   On: 04/30/2020 11:58   Labs on Admission: I have personally reviewed following labs  CBC: Recent Labs  Lab 04/30/20 1238  WBC 7.9  HGB 13.0  HCT 37.1  MCV 93.2  PLT 272   Basic Metabolic Panel: Recent Labs  Lab 04/30/20 1238  NA 139  K  3.7  CL 104  CO2 25  GLUCOSE 107*  BUN 8  CREATININE 0.57  CALCIUM 9.2  MG 1.8   GFR: Estimated Creatinine Clearance: 47.5 mL/min (by C-G formula based on SCr of 0.57 mg/dL).  Urine analysis:    Component Value Date/Time   COLORURINE YELLOW (A) 01/10/2016 1430   APPEARANCEUR CLEAR (A) 01/10/2016 1430   LABSPEC 1.017 01/10/2016 1430   PHURINE 6.0 01/10/2016 1430   GLUCOSEU NEGATIVE 01/10/2016 1430   HGBUR NEGATIVE 01/10/2016 1430   BILIRUBINUR NEGATIVE 01/10/2016 1430   KETONESUR NEGATIVE 01/10/2016 1430   PROTEINUR NEGATIVE 01/10/2016 1430   NITRITE NEGATIVE 01/10/2016 1430   LEUKOCYTESUR NEGATIVE 01/10/2016 1430   Keanu Lesniak N Desarai Barrack D.O. [facility]  If 7PM-7AM, please contact overnight-coverage provider If 7AM-7PM, please contact day coverage provider [email or web address]  04/30/2020, 2:33 PM

## 2020-04-30 NOTE — ED Notes (Signed)
Family updated as to patient's status.

## 2020-04-30 NOTE — ED Provider Notes (Signed)
[facility] Emergency Department Provider Note  ____________________________________________   Event Date/Time   First MD Initiated Contact with Patient 04/30/20 1106     (approximate)  I have reviewed the triage vital signs and the nursing notes.   HISTORY  Chief Complaint Chest Pain   HPI [name] is a 80 y.o. female with past medical history of arthritis, HTN, PVD, carotid stenosis, HDL, breast cancer status post lumpectomy radiation therapy who presents for assessment of some substernal chest pain associated with some diaphoresis and nausea began yesterday evening and continued overnight has not significantly improved.  Patient states she also has very similar sensation described as chest pressure whenever she exerts herself by walking to the end of her driveway to get her mail but this typically is relieved by rest.  She states that sometimes get it after meals but does not think it is all related to reflux.  She endorses a little cough yesterday she was out in the pollen and thinks is likely the cause of her cough yesterday.  She denies any shortness of breath or cough today or any other associate symptoms including headache earache sore throat, current nausea, diarrhea, dysuria, Donnell pain, back pain, acute extremity pain, rash or recent injuries or falls.  No known cardiac or pulmonary history.  She is not on any blood thinners.  No other acute concerns at this time.         Past Medical History:  Diagnosis Date  . Arthritis   . Breast cancer ([place]) 2011   radiation- Left  . Hypertension   . Personal history of radiation therapy   . PVD (peripheral vascular disease) [facility])     Patient Active Problem List   Diagnosis Date Noted  . NSTEMI (non-ST elevated myocardial infarction) ([place]) 04/30/2020  . History of left breast cancer 11/25/2019  . Hyperlipidemia 11/25/2019  . Carotid stenosis 08/24/2017  . 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   . Pain 01/22/2016  . 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/2016  . PAD (peripheral artery disease) ([place]) 11/22/2014  . H/O malignant neoplasm of breast 11/22/2014  . HLD (hyperlipidemia) 11/22/2014  . Essential hypertension 11/22/2014  . 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 ABDOMINAL HYSTERECTOMY    . BREAST BIOPSY Left 12/21/2009   positive, radiation dcis  . BREAST EXCISIONAL BIOPSY Left 01/17/2010   lumpectomy DCIS  . BREAST LUMPECTOMY    . CATARACT EXTRACTION W/ INTRAOCULAR LENS IMPLANT & ANTERIOR VITRECTOMY, BILATERAL    . CHOLECYSTECTOMY    . COLONOSCOPY WITH PROPOFOL N/A 09/25/2017   Procedure: COLONOSCOPY WITH PROPOFOL;  Surgeon: Lollie Sails, MD;  Location: [facility] ENDOSCOPY;  Service: Endoscopy;  Laterality: N/A;  . DILATION AND CURETTAGE OF UTERUS    . JOINT REPLACEMENT Right 01/24/2016  . KNEE ARTHROPLASTY Right 01/24/2016   Procedure: COMPUTER ASSISTED TOTAL KNEE ARTHROPLASTY;  Surgeon: Dereck Leep, MD;  Location: [facility] ORS;  Service: Orthopedics;  Laterality: Right;  . stent in Lt leg Left     Prior to Admission medications   Medication Sig Start Date End Date Taking? Authorizing Provider  acetaminophen (TYLENOL) 500 MG tablet Take 1,000 mg by mouth every 6 (six) hours as needed for mild pain.   Yes [provider]  diphenhydrAMINE (BENADRYL) 50 MG tablet Take 0.5 tablets (25 mg total) by mouth every 6 (six) hours as needed for itching. 01/12/17  Yes Schuyler Amor,  MD  diphenhydrAMINE-zinc acetate (BENADRYL) cream Apply 1 g topically daily as needed for itching.   Yes [provider]  aliskiren (TEKTURNA) 300 MG tablet TAKE ONE TABLET BY MOUTH ONCE DAILY Patient not taking: No sig reported 06/29/14   [provider]  amLODipine (NORVASC) 10 MG tablet Take 10 mg by mouth daily. 07/14/14   [provider]  aspirin EC 81 MG tablet Take 81 mg by mouth daily.     [provider]  atenolol (TENORMIN) 100 MG tablet Take 100 mg by mouth daily. 10/10/14   [provider]  Calcium Carbonate-Vit D-Min (CALTRATE 600+D PLUS PO) Take 1 tablet by mouth daily.     [provider]  GLUCOSAMINE-CHONDROITIN PO Take 2 tablets by mouth daily. Patient not taking: No sig reported    [provider]  hydrochlorothiazide (HYDRODIURIL) 25 MG tablet Take 25 mg by mouth daily. 10/10/14   [provider]  Multiple Vitamin (MULTI-VITAMINS) TABS Take 1 tablet by mouth daily.     [provider]  potassium chloride SA (KLOR-CON) 20 MEQ tablet 20 mEq daily. 08/05/14   [provider]  simvastatin (ZOCOR) 10 MG tablet Take 10 mg by mouth daily at 6 PM.  06/06/14   [provider]  timolol (TIMOPTIC) 0.5 % ophthalmic solution Place 1 drop into both eyes daily.  Patient not taking: Reported on 04/30/2020    [provider]    Allergies Ibuprofen, Penicillins, Aleve [naproxen sodium], Avapro  [irbesartan], Azithromycin, Nickel, and Other  No family history on file.  Social History Social History   Tobacco Use  . Smoking status: Former Research scientist (life sciences)  . Smokeless tobacco: Never Used  Substance Use Topics  . Alcohol use: No    Alcohol/week: 0.0 standard drinks  . Drug use: No    Review of Systems  Review of Systems  Constitutional: Positive for diaphoresis. Negative for chills and fever.  HENT: Negative for sore throat.   Eyes: Negative for pain.  Respiratory: Negative for cough and stridor.   Cardiovascular: Positive for chest pain.  Gastrointestinal: Positive for nausea. Negative for vomiting.  Genitourinary: Negative for dysuria.  Musculoskeletal: Negative for myalgias.  Skin: Negative for rash.  Neurological: Negative for seizures, loss of consciousness and headaches.  Psychiatric/Behavioral: Negative for suicidal ideas.  All other systems reviewed and are negative.      ____________________________________________   PHYSICAL EXAM:  VITAL SIGNS: ED Triage Vitals  Enc Vitals Group     BP 04/30/20 1041 (!) (P) 155/68     Pulse Rate 04/30/20 1039 78     Resp 04/30/20 1039 17     Temp 04/30/20 1036 97.9 F (36.6 C)     Temp Source 04/30/20 1036 Oral     SpO2 04/30/20 1039 98 %     Weight 04/30/20 1038 131 lb (59.4 kg)     Height 04/30/20 1038 5' (1.524 m)     Head Circumference --      Peak Flow --      Pain Score 04/30/20 1038 2     Pain Loc --      Pain Edu? --      Excl. in [place]? --    Vitals:   04/30/20 1039 04/30/20 1041  BP:  (!) (P) 155/68  Pulse: 78   Resp: 17   Temp:    SpO2: 98%    Physical Exam Vitals and nursing note reviewed.  Constitutional:      General: She is not  in acute distress.    Appearance: She is well-developed. She is obese.  HENT:     Head: Normocephalic and atraumatic.     Right Ear: External ear normal.     Left Ear: External ear normal.     Nose: Nose normal.  Eyes:     Conjunctiva/sclera: Conjunctivae normal.  Cardiovascular:     Rate and Rhythm: Normal rate and regular rhythm.     Heart sounds: No murmur heard.   Pulmonary:     Effort: Pulmonary effort is normal. No respiratory distress.     Breath sounds: Normal breath sounds.  Abdominal:     Palpations: Abdomen is soft.     Tenderness: There is no abdominal tenderness.  Musculoskeletal:     Cervical back: Neck supple.  Skin:    General: Skin is warm and dry.     Capillary Refill: Capillary refill takes less than 2 seconds.  Neurological:     Mental Status: She is alert and oriented to person, place, and time.  Psychiatric:        Mood and Affect: Mood normal.      ____________________________________________   LABS (all labs ordered are listed, but only abnormal results are displayed)  Labs Reviewed  BASIC METABOLIC PANEL - Abnormal; Notable for the following components:      Result Value   Glucose, Bld 107 (*)    All other  components within normal limits  TROPONIN I (HIGH SENSITIVITY) - Abnormal; Notable for the following components:   Troponin I (High Sensitivity) 19,201 (*)    All other components within normal limits  SARS CORONAVIRUS 2 (TAT 6-24 HRS)  MAGNESIUM  CBC  APTT  PROTIME-INR  HEPARIN LEVEL (UNFRACTIONATED)  TROPONIN I (HIGH SENSITIVITY)   ____________________________________________  EKG  Initial ECG with sinus rhythm with a ventricular rate of 85 with some concerning elevations in aVR and V1 as well as ST depressions in inferior lateral leads.  Repeat shows ____________________________________________  RADIOLOGY  ED MD interpretation: Left lung mass.  No large effusion, significant edema, pneumothorax or other clear acute intrathoracic process.  Official radiology report(s): DG Chest 2 View  Result Date: 04/30/2020 CLINICAL DATA:  Chest pain EXAM: CHEST - 2 VIEW COMPARISON:  None. FINDINGS: There is a 6 x 6.6 x 6.2 cm mass in the posterior left mid lung. There is no pleural effusion. The right lung is clear. The mediastinal contour and cardiac silhouette are normal. Degenerative joint changes of the spine are identified. IMPRESSION: Mass in the posterior left mid lung. Further evaluation with chest CT is recommended. Electronically Signed   By: Abelardo Diesel M.D.   On: 04/30/2020 11:58    ____________________________________________   PROCEDURES  Procedure(s) performed (including Critical Care):  .Critical Care Performed by: Lucrezia Starch, MD Authorized by: Lucrezia Starch, MD   Critical care provider statement:    Critical care time (minutes):  45   Critical care time was exclusive of:  Separately billable procedures and treating other patients   Critical care was necessary to treat or prevent imminent or life-threatening deterioration of the following conditions:  Cardiac failure   Critical care was time spent personally by me on the following activities:  Discussions  with consultants, evaluation of patient's response to treatment, examination of patient, ordering and performing treatments and interventions, ordering and review of laboratory studies, ordering and review of radiographic studies, pulse oximetry, re-evaluation of patient's condition, obtaining history from patient or surrogate and review of old charts  ____________________________________________   INITIAL IMPRESSION / ASSESSMENT AND PLAN / ED COURSE      Patient presents with above to history exam for assessment of chest pain described above.  On arrival she is hypertensive with a BP of 155/68 with otherwise stable vital signs on room air.  Differential includes ACS, symptomatic arrhythmia, anemia, metabolic derangements, PE, pneumothorax, pneumonia, dissection, and GI etiologies bronchitis.  Initial ECG with sinus rhythm with a ventricular rate of 85 with some concerning elevations in aVR and V1 as well as ST depressions in inferior lateral leads.  Chest x-ray has no opacity in the left concerning for possible mass but no clear evidence of pneumonia, thorax, effusion, edema or other clear acute intrathoracic process.  Magnesium WNL.  BMP unremarkable.  CBC unremarkable.  Initial troponin elevated at 19,201.  Given mass on x-ray CT chest obtained per radiology recommendation.  Suspect this is likely incidental but concerning for possible malignancy.  Patient given ASA and started on heparin with concerns for an NSTEMI.  Patient seen by cardiology emergently in the ED.  No acute intervention other than medical management recommended at this time with likely plan for cath tomorrow.  Patient admitted to medicine service.  ____________________________________________   FINAL CLINICAL IMPRESSION(S) / ED DIAGNOSES  Final diagnoses:  NSTEMI (non-ST elevated myocardial infarction) (HCC)    Medications  nitroGLYCERIN (NITROSTAT) SL tablet 0.4 mg (has no administration in time range)   heparin bolus via infusion 3,500 Units (has no administration in time range)  heparin ADULT infusion 100 units/mL (25000 units/229mL) (has no administration in time range)  aspirin chewable tablet 324 mg (324 mg Oral Given 04/30/20 1145)     ED Discharge Orders    None       Note:  This document was prepared using Dragon voice recognition software and may include unintentional dictation errors.   Lucrezia Starch, MD 04/30/20 1401

## 2020-05-01 ENCOUNTER — Encounter (HOSPITAL_COMMUNITY): Payer: Self-pay | Admitting: Internal Medicine

## 2020-05-01 ENCOUNTER — Inpatient Hospital Stay (HOSPITAL_COMMUNITY)
Admission: AD | Admit: 2020-05-01 | Discharge: 2020-05-09 | DRG: 235 | Disposition: A | Discharge status: Home / Self Care | Payer: Medicare Other | Source: Other Acute Inpatient Hospital | Attending: Thoracic Surgery (Cardiothoracic Vascular Surgery) | Admitting: Thoracic Surgery (Cardiothoracic Vascular Surgery)

## 2020-05-01 ENCOUNTER — Encounter
Admission: EM | Disposition: A | Discharge status: Home / Self Care | Payer: Self-pay | Source: Home / Self Care | Attending: Emergency Medicine

## 2020-05-01 DIAGNOSIS — Z923 Personal history of irradiation: Secondary | ICD-10-CM | POA: Diagnosis not present

## 2020-05-01 DIAGNOSIS — Z96651 Presence of right artificial knee joint: Secondary | ICD-10-CM | POA: Diagnosis present

## 2020-05-01 DIAGNOSIS — I2510 Atherosclerotic heart disease of native coronary artery without angina pectoris: Secondary | ICD-10-CM | POA: Diagnosis not present

## 2020-05-01 DIAGNOSIS — I447 Left bundle-branch block, unspecified: Secondary | ICD-10-CM | POA: Diagnosis present

## 2020-05-01 DIAGNOSIS — Z880 Allergy status to penicillin: Secondary | ICD-10-CM

## 2020-05-01 DIAGNOSIS — Z7982 Long term (current) use of aspirin: Secondary | ICD-10-CM | POA: Diagnosis not present

## 2020-05-01 DIAGNOSIS — J90 Pleural effusion, not elsewhere classified: Secondary | ICD-10-CM

## 2020-05-01 DIAGNOSIS — I10 Essential (primary) hypertension: Secondary | ICD-10-CM | POA: Diagnosis not present

## 2020-05-01 DIAGNOSIS — I110 Hypertensive heart disease with heart failure: Secondary | ICD-10-CM | POA: Diagnosis present

## 2020-05-01 DIAGNOSIS — I340 Nonrheumatic mitral (valve) insufficiency: Secondary | ICD-10-CM | POA: Diagnosis not present

## 2020-05-01 DIAGNOSIS — Z853 Personal history of malignant neoplasm of breast: Secondary | ICD-10-CM

## 2020-05-01 DIAGNOSIS — Z8249 Family history of ischemic heart disease and other diseases of the circulatory system: Secondary | ICD-10-CM

## 2020-05-01 DIAGNOSIS — I959 Hypotension, unspecified: Secondary | ICD-10-CM | POA: Diagnosis present

## 2020-05-01 DIAGNOSIS — Z888 Allergy status to other drugs, medicaments and biological substances status: Secondary | ICD-10-CM

## 2020-05-01 DIAGNOSIS — Z01810 Encounter for preprocedural cardiovascular examination: Secondary | ICD-10-CM | POA: Diagnosis not present

## 2020-05-01 DIAGNOSIS — I739 Peripheral vascular disease, unspecified: Secondary | ICD-10-CM | POA: Diagnosis present

## 2020-05-01 DIAGNOSIS — Z79899 Other long term (current) drug therapy: Secondary | ICD-10-CM

## 2020-05-01 DIAGNOSIS — Z886 Allergy status to analgesic agent status: Secondary | ICD-10-CM | POA: Diagnosis not present

## 2020-05-01 DIAGNOSIS — I5021 Acute systolic (congestive) heart failure: Secondary | ICD-10-CM | POA: Diagnosis present

## 2020-05-01 DIAGNOSIS — R197 Diarrhea, unspecified: Secondary | ICD-10-CM | POA: Diagnosis present

## 2020-05-01 DIAGNOSIS — Z95820 Peripheral vascular angioplasty status with implants and grafts: Secondary | ICD-10-CM | POA: Diagnosis not present

## 2020-05-01 DIAGNOSIS — D62 Acute posthemorrhagic anemia: Secondary | ICD-10-CM | POA: Diagnosis present

## 2020-05-01 DIAGNOSIS — Z951 Presence of aortocoronary bypass graft: Secondary | ICD-10-CM

## 2020-05-01 DIAGNOSIS — I214 Non-ST elevation (NSTEMI) myocardial infarction: Secondary | ICD-10-CM | POA: Diagnosis present

## 2020-05-01 DIAGNOSIS — Z87891 Personal history of nicotine dependence: Secondary | ICD-10-CM | POA: Diagnosis not present

## 2020-05-01 DIAGNOSIS — R000 Tachycardia, unspecified: Secondary | ICD-10-CM | POA: Diagnosis present

## 2020-05-01 DIAGNOSIS — Z881 Allergy status to other antibiotic agents status: Secondary | ICD-10-CM

## 2020-05-01 DIAGNOSIS — I25110 Atherosclerotic heart disease of native coronary artery with unstable angina pectoris: Secondary | ICD-10-CM | POA: Diagnosis present

## 2020-05-01 DIAGNOSIS — E785 Hyperlipidemia, unspecified: Secondary | ICD-10-CM | POA: Diagnosis present

## 2020-05-01 DIAGNOSIS — R079 Chest pain, unspecified: Secondary | ICD-10-CM | POA: Diagnosis not present

## 2020-05-01 DIAGNOSIS — J9811 Atelectasis: Secondary | ICD-10-CM

## 2020-05-01 DIAGNOSIS — E782 Mixed hyperlipidemia: Secondary | ICD-10-CM | POA: Diagnosis not present

## 2020-05-01 HISTORY — PX: LEFT HEART CATH AND CORONARY ANGIOGRAPHY: CATH118249

## 2020-05-01 LAB — CBC
HCT: 35.3 % — ABNORMAL LOW (ref 36.0–46.0)
Hemoglobin: 12.4 g/dL (ref 12.0–15.0)
MCH: 33.2 pg (ref 26.0–34.0)
MCHC: 35.1 g/dL (ref 30.0–36.0)
MCV: 94.4 fL (ref 80.0–100.0)
Platelets: 180 10*3/uL (ref 150–400)
RBC: 3.74 MIL/uL — ABNORMAL LOW (ref 3.87–5.11)
RDW: 13.6 % (ref 11.5–15.5)
WBC: 5.5 10*3/uL (ref 4.0–10.5)
nRBC: 0 % (ref 0.0–0.2)

## 2020-05-01 LAB — LIPID PANEL
Cholesterol: 99 mg/dL (ref 0–200)
HDL: 29 mg/dL — ABNORMAL LOW (ref >40)
LDL Cholesterol: 32 mg/dL (ref 0–99)
Total CHOL/HDL Ratio: 3.4 RATIO
Triglycerides: 190 mg/dL — ABNORMAL HIGH (ref <150)
VLDL: 38 mg/dL (ref 0–40)

## 2020-05-01 LAB — TSH: TSH: 2.469 u[IU]/mL (ref 0.350–4.500)

## 2020-05-01 LAB — COMPREHENSIVE METABOLIC PANEL
ALT: 23 U/L (ref 0–44)
AST: 84 U/L — ABNORMAL HIGH (ref 15–41)
Albumin: 3.1 g/dL — ABNORMAL LOW (ref 3.5–5.0)
Alkaline Phosphatase: 61 U/L (ref 38–126)
Anion gap: 6 (ref 5–15)
BUN: 7 mg/dL — ABNORMAL LOW (ref 8–23)
CO2: 25 mmol/L (ref 22–32)
Calcium: 8.9 mg/dL (ref 8.9–10.3)
Chloride: 106 mmol/L (ref 98–111)
Creatinine, Ser: 0.61 mg/dL (ref 0.44–1.00)
GFR, Estimated: >60 mL/min (ref >60)
Glucose, Bld: 117 mg/dL — ABNORMAL HIGH (ref 70–99)
Potassium: 3.6 mmol/L (ref 3.5–5.1)
Sodium: 137 mmol/L (ref 135–145)
Total Bilirubin: 1.6 mg/dL — ABNORMAL HIGH (ref 0.3–1.2)
Total Protein: 6.8 g/dL (ref 6.5–8.1)

## 2020-05-01 LAB — HEMOGLOBIN A1C
Hgb A1c MFr Bld: 5.7 % — ABNORMAL HIGH (ref 4.8–5.6)
Mean Plasma Glucose: 117 mg/dL

## 2020-05-01 SURGERY — LEFT HEART CATH AND CORONARY ANGIOGRAPHY
Anesthesia: Moderate Sedation

## 2020-05-01 MED ORDER — SODIUM CHLORIDE 0.9% FLUSH: 3.0000 mL | INTRAVENOUS | Status: DC | PRN

## 2020-05-01 MED ORDER — MIDAZOLAM HCL 2 MG/2ML IJ SOLN
INTRAMUSCULAR | Status: AC
  Filled 2020-05-01: qty 2

## 2020-05-01 MED ORDER — SODIUM CHLORIDE 0.9% FLUSH
3.0000 mL | Freq: Two times a day (BID) | INTRAVENOUS | Status: DC
Start: 2020-05-01 — End: 2020-05-01

## 2020-05-01 MED ORDER — HEPARIN (PORCINE) 25000 UT/250ML-% IV SOLN
950.0000 [IU]/h | INTRAVENOUS | Status: DC
  Filled 2020-05-01: qty 250

## 2020-05-01 MED ORDER — NITROGLYCERIN 0.4 MG SL SUBL: 0.4000 mg | SUBLINGUAL_TABLET | SUBLINGUAL | Status: DC | PRN

## 2020-05-01 MED ORDER — VERAPAMIL HCL 2.5 MG/ML IV SOLN
INTRAVENOUS | Status: AC
  Filled 2020-05-01: qty 2

## 2020-05-01 MED ORDER — ATORVASTATIN CALCIUM 20 MG PO TABS: 40.0000 mg | ORAL_TABLET | Freq: Every day | ORAL | Status: DC

## 2020-05-01 MED ORDER — CARVEDILOL 3.125 MG PO TABS
3.1250 mg | ORAL_TABLET | Freq: Two times a day (BID) | ORAL | Status: DC
  Administered 2020-05-02 (×2): 3.125 mg via ORAL
  Filled 2020-05-01 (×2): qty 1

## 2020-05-01 MED ORDER — FENTANYL CITRATE (PF) 100 MCG/2ML IJ SOLN
INTRAMUSCULAR | Status: DC | PRN
  Administered 2020-05-01: 25 ug via INTRAVENOUS

## 2020-05-01 MED ORDER — VERAPAMIL HCL 2.5 MG/ML IV SOLN
INTRAVENOUS | Status: DC | PRN
  Administered 2020-05-01: 2.5 mg via INTRAVENOUS

## 2020-05-01 MED ORDER — HEPARIN SODIUM (PORCINE) 1000 UNIT/ML IJ SOLN
INTRAMUSCULAR | Status: DC | PRN
  Administered 2020-05-01: 3000 [IU] via INTRAVENOUS

## 2020-05-01 MED ORDER — HEPARIN (PORCINE) 25000 UT/250ML-% IV SOLN: 950.0000 [IU]/h | INTRAVENOUS | Status: DC

## 2020-05-01 MED ORDER — ASPIRIN 81 MG PO CHEW
CHEWABLE_TABLET | ORAL | Status: AC
  Filled 2020-05-01: qty 1

## 2020-05-01 MED ORDER — ASPIRIN EC 81 MG PO TBEC
81.0000 mg | DELAYED_RELEASE_TABLET | Freq: Every day | ORAL | Status: DC
  Administered 2020-05-02: 81 mg via ORAL
  Filled 2020-05-01: qty 1

## 2020-05-01 MED ORDER — SODIUM CHLORIDE 0.9 % IV SOLN: 250.0000 mL | INTRAVENOUS | Status: DC | PRN

## 2020-05-01 MED ORDER — FENTANYL CITRATE (PF) 100 MCG/2ML IJ SOLN
INTRAMUSCULAR | Status: AC
  Filled 2020-05-01: qty 2

## 2020-05-01 MED ORDER — ATORVASTATIN CALCIUM 40 MG PO TABS
40.0000 mg | ORAL_TABLET | Freq: Every day | ORAL | Status: DC
  Administered 2020-05-02: 40 mg via ORAL
  Filled 2020-05-01 (×2): qty 1

## 2020-05-01 MED ORDER — ONDANSETRON HCL 4 MG/2ML IJ SOLN: 4.0000 mg | Freq: Four times a day (QID) | INTRAMUSCULAR | Status: DC | PRN

## 2020-05-01 MED ORDER — HEPARIN (PORCINE) IN NACL 1000-0.9 UT/500ML-% IV SOLN
INTRAVENOUS | Status: AC
  Filled 2020-05-01: qty 1000

## 2020-05-01 MED ORDER — ACETAMINOPHEN 325 MG PO TABS: 650.0000 mg | ORAL_TABLET | ORAL | Status: DC | PRN

## 2020-05-01 MED ORDER — IOHEXOL 300 MG/ML  SOLN
INTRAMUSCULAR | Status: DC | PRN
  Administered 2020-05-01: 73 mL

## 2020-05-01 MED ORDER — ASPIRIN EC 325 MG PO TBEC: 325.0000 mg | DELAYED_RELEASE_TABLET | Freq: Every day | ORAL | Status: DC

## 2020-05-01 MED ORDER — MIDAZOLAM HCL 2 MG/2ML IJ SOLN
INTRAMUSCULAR | Status: DC | PRN
  Administered 2020-05-01: 1 mg via INTRAVENOUS

## 2020-05-01 MED ORDER — HEPARIN (PORCINE) 25000 UT/250ML-% IV SOLN
1300.0000 [IU]/h | INTRAVENOUS | Status: DC
  Administered 2020-05-01: 950 [IU]/h via INTRAVENOUS
  Administered 2020-05-02: 1300 [IU]/h via INTRAVENOUS
  Filled 2020-05-01 (×2): qty 250

## 2020-05-01 MED ORDER — SODIUM CHLORIDE 0.9 % IV SOLN: INTRAVENOUS | Status: DC

## 2020-05-01 MED ORDER — HEPARIN SODIUM (PORCINE) 1000 UNIT/ML IJ SOLN
INTRAMUSCULAR | Status: AC
  Filled 2020-05-01: qty 1

## 2020-05-01 MED ORDER — HEPARIN (PORCINE) IN NACL 1000-0.9 UT/500ML-% IV SOLN
INTRAVENOUS | Status: DC | PRN
  Administered 2020-05-01 (×2): 500 mL

## 2020-05-01 MED ORDER — NITROGLYCERIN 2 % TD OINT
1.0000 [in_us] | TOPICAL_OINTMENT | Freq: Four times a day (QID) | TRANSDERMAL | 0 refills | Status: DC
Start: 2020-05-01 — End: 2020-05-09

## 2020-05-01 MED ORDER — SODIUM CHLORIDE 0.9% FLUSH: 3.0000 mL | Freq: Two times a day (BID) | INTRAVENOUS | Status: DC

## 2020-05-01 MED ORDER — HEPARIN BOLUS VIA INFUSION
2000.0000 [IU] | Freq: Once | INTRAVENOUS | Status: AC
  Administered 2020-05-01: 2000 [IU] via INTRAVENOUS
  Filled 2020-05-01: qty 2000

## 2020-05-01 SURGICAL SUPPLY — 11 items
CATH INFINITI 5 FR JL3.5 (CATHETERS) ×1 IMPLANT
CATH INFINITI 5FR JK (CATHETERS) ×1 IMPLANT
DEVICE RAD TR BAND REGULAR (VASCULAR PRODUCTS) ×1 IMPLANT
DRAPE BRACHIAL (DRAPES) ×1 IMPLANT
GLIDESHEATH SLEND SS 6F .021 (SHEATH) ×1 IMPLANT
GUIDEWIRE INQWIRE 1.5J.035X260 (WIRE) IMPLANT
INQWIRE 1.5J .035X260CM (WIRE) ×2
PACK CARDIAC CATH (CUSTOM PROCEDURE TRAY) ×2 IMPLANT
PROTECTION STATION PRESSURIZED (MISCELLANEOUS) ×2
SET ATX SIMPLICITY (MISCELLANEOUS) ×1 IMPLANT
STATION PROTECTION PRESSURIZED (MISCELLANEOUS) IMPLANT

## 2020-05-01 NOTE — Discharge Summary (Signed)
Physician Discharge Summary  Patient ID: [name] MRN: [id number] DOB/AGE: 04/22/1940 80 y.o.  Admit date: 04/30/2020 Discharge date: 05/01/2020  Admission Diagnoses:  Discharge Diagnoses:  Principal Problem:   NSTEMI (non-ST elevated myocardial infarction) ([place]) Active Problems:   DCIS (ductal carcinoma in situ)   PAD (peripheral artery disease) ([place])   H/O malignant neoplasm of breast   HLD (hyperlipidemia)   Essential hypertension   Elevated troponin   Discharged Condition: fair  Hospital Course:  [name] is a 80 y.o. 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 EKG did not show ST elevation, troponin peak 19,000, she has been seen by cardiology, heart cath was performed.  It showed multivessel disease.  Cardiology has called to [facility] for transfer, and will need CABG.  I will discharge her.    Consults: cardiology  Significant Diagnostic Studies:  Heart cath:  There is moderate left ventricular systolic dysfunction.  LV end diastolic pressure is mildly elevated.  The left ventricular ejection fraction is 35-45% by visual estimate.  Ost RCA to Prox RCA lesion is 60% stenosed.  Mid RCA to Dist RCA lesion is 100% stenosed.  Ost LM to Dist LM lesion is 80% stenosed.  Ost Cx to Prox Cx lesion is 80% stenosed.  Ost LAD to Prox LAD lesion is 80% stenosed.   1.  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  Significant pressure dampening with catheter engagement of the left main coronary artery. 2.  Moderately reduced LV systolic function.  Mildly elevated left ventricular end-diastolic pressure.  Recommendations: Recommend transfer to [facility] for CABG evaluation. Obtain an echocardiogram to better evaluate LV  systolic function and valvular abnormalities. Resume heparin 6 hours after sheath pull.   Treatments: Heparin drip, Heart cath  Discharge Exam: Blood pressure (!) 136/57, pulse 82, temperature 98.1 F (36.7 C), temperature source Oral, resp. rate (!) 27, height 5' (1.524 m), weight 59.4 kg, SpO2 97 %. General appearance: alert and cooperative Resp: clear to auscultation bilaterally Cardio: regular rate and rhythm, S1, S2 normal, no murmur, click, rub or gallop GI: soft, non-tender; bowel sounds normal; no masses,  no organomegaly Extremities: extremities normal, atraumatic, no cyanosis or edema  Disposition: Discharge disposition: 02-Transferred to [facility]       Discharge Instructions    Diet - low sodium heart healthy   Complete by: As directed    Increase activity slowly   Complete by: As directed      Allergies as of 05/01/2020      Reactions   Ibuprofen Hives   Penicillins Hives   Aleve [naproxen Sodium] Hives   Avapro  [irbesartan] Rash   Other reaction(s): Weal   Azithromycin Hives   Nickel Itching   Redness, itching and fluid discharge with nickel earrings   Other Other (See Comments)   Nickel"ears ran fluid and itching" redness and "sensitive" Nickel"ears ran fluid and itching"      Medication List    STOP taking these medications   aliskiren 300 MG tablet Commonly known as: TEKTURNA   amLODipine 10 MG tablet Commonly known as: NORVASC   hydrochlorothiazide 25 MG tablet Commonly known as: HYDRODIURIL   potassium chloride SA 20 MEQ tablet Commonly known as: KLOR-CON   simvastatin 10 MG tablet Commonly known as: ZOCOR  TAKE these medications   acetaminophen 500 MG tablet Commonly known as: TYLENOL Take 1,000 mg by mouth every 6 (six) hours as needed for mild pain.   aspirin EC 81 MG tablet Take 81 mg by mouth daily.   atenolol 100 MG tablet Commonly known as: TENORMIN Take 100 mg by mouth daily.   atorvastatin 20 MG  tablet Commonly known as: LIPITOR Take 2 tablets (40 mg total) by mouth at bedtime.   CALTRATE 600+D PLUS PO Take 1 tablet by mouth daily.   diphenhydrAMINE 50 MG tablet Commonly known as: BENADRYL Take 0.5 tablets (25 mg total) by mouth every 6 (six) hours as needed for itching.   diphenhydrAMINE-zinc acetate cream Commonly known as: BENADRYL Apply 1 g topically daily as needed for itching.   GLUCOSAMINE-CHONDROITIN PO Take 2 tablets by mouth daily.   heparin 25000 UT/250ML infusion Inject 950 Units/hr into the vein continuous.   Multi-Vitamins Tabs Take 1 tablet by mouth daily.   nitroGLYCERIN 2 % ointment Commonly known as: NITROGLYN Apply 1 inch topically every 6 (six) hours.   timolol 0.5 % ophthalmic solution Commonly known as: TIMOPTIC Place 1 drop into both eyes daily.        Signed: Sharen Hones 05/01/2020, 12:47 PM

## 2020-05-01 NOTE — Interval H&P Note (Signed)
Cath Lab Visit (complete for each Cath Lab visit)  Clinical Evaluation Leading to the Procedure:   ACS: Yes.    Non-ACS:  n/a    History and Physical Interval Note:  05/01/2020 9:26 AM  [name]  has presented today for surgery, with the diagnosis of Left heart catheterization and coronary angiography with possible percutaneous intervention.  The various methods of treatment have been discussed with the patient and family. After consideration of risks, benefits and other options for treatment, the patient has consented to  Procedure(s): LEFT HEART CATH AND CORONARY ANGIOGRAPHY (N/A) as a surgical intervention.  The patient's history has been reviewed, patient examined, no change in status, stable for surgery.  I have reviewed the patient's chart and labs.  Questions were answered to the patient's satisfaction.     [name]

## 2020-05-01 NOTE — H&P (Signed)
Cardiology Admission History and Physical:   Patient ID: [name] MRN: [id number]; DOB: 11/21/1940   Admission date: 05/01/2020  PCP:  Derinda Late, MD   [place]  Cardiologist:  New to [facility] - seen by Dr. Lovena Le at [facility] Electrophysiologist:  None [id number]    Chief Complaint:  Chest pain  Patient Profile:   [name] is a 80 y.o. female with PVD s/p intervention of the left common iliac artery in 2000, left-sided breast cancer status post lumpectomy and radiation, HTN, HLD, and prior tobacco use at 1 pack/day quitting over 20 years ago who was admitted to [facility] on 4/17 with an NSTEMI.  History of Present Illness:   [name] had no previously known cardiac history prior to her admission to [facility] on 4/17.  Leading up to her presentation on 4/17, she reported a several week history of exertional angina that was progressive.  On the evening of 4/16 she developed substernal chest pain that radiated to the left arm and was associated nausea and vomiting.  EKG showed sinus rhythm with a left bundle branch block which was new when compared to the only available prior tracing dated in 2011.  Initial high-sensitivity troponin 19,201 with a delta troponin of 14,653.  BNP 646.  Chest x-ray showed a mass in the posterior left midlung with recommendation for CT.  CTA chest showed no evidence of PE along with a rounded consolidation in the left lower lobe surrounding an apparent 3.3 x 2.5 x 2.5 cm hypoenhancing lesion as well as small left and trace right pleural effusions, mild cardiomegaly with suspected interstitial edema, findings consistent with bronchitis or reactive airway disease, nodular contour of the left hepatic lobe suggesting cirrhosis, and aortic atherosclerosis.  She received ASA 324 mg x 1 and was placed on a heparin drip.  She underwent cardiac cath on 4/18 which showed severe calcification of the left main and three-vessel CAD as outlined below.   There was significant pressure dampening with catheter engagement of the left main coronary artery.  There was moderately reduced LV systolic function with an EF of 35 to 45% by LV gram, and a mildly elevated LVEDP.  Recommendation was made to resume heparin drip 6 hours post sheath pull and transfer the patient to [facility] for consideration of CABG.  Of note, the only antiplatelet medication the patient received at [facility] was aspirin.   Past Medical History:  Diagnosis Date  . Arthritis   . Breast cancer ([place]) 2011   radiation- Left  . Hypertension   . Personal history of radiation therapy   . PVD (peripheral vascular disease) ([place])     Past Surgical History:  Procedure Laterality Date  . ABDOMINAL HYSTERECTOMY    . BREAST BIOPSY Left 12/21/2009   positive, radiation dcis  . BREAST EXCISIONAL BIOPSY Left 01/17/2010   lumpectomy DCIS  . BREAST LUMPECTOMY    . CATARACT EXTRACTION W/ INTRAOCULAR LENS IMPLANT & ANTERIOR VITRECTOMY, BILATERAL    . CHOLECYSTECTOMY    . COLONOSCOPY WITH PROPOFOL N/A 09/25/2017   Procedure: COLONOSCOPY WITH PROPOFOL;  Surgeon: Lollie Sails, MD;  Location: [facility] ENDOSCOPY;  Service: Endoscopy;  Laterality: N/A;  . DILATION AND CURETTAGE OF UTERUS    . JOINT REPLACEMENT Right 01/24/2016  . KNEE ARTHROPLASTY Right 01/24/2016   Procedure: COMPUTER ASSISTED TOTAL KNEE ARTHROPLASTY;  Surgeon: Dereck Leep, MD;  Location: [facility] ORS;  Service: Orthopedics;  Laterality: Right;  . stent in Lt leg Left  Medications Prior to Admission: Prior to Admission medications   Medication Sig Start Date End Date Taking? Authorizing Provider  acetaminophen (TYLENOL) 500 MG tablet Take 1,000 mg by mouth every 6 (six) hours as needed for mild pain.    [provider]  aliskiren (TEKTURNA) 300 MG tablet TAKE ONE TABLET BY MOUTH ONCE DAILY Patient not taking: No sig reported 06/29/14   [provider]  amLODipine (NORVASC) 10 MG tablet Take 10 mg by mouth  daily. 07/14/14   [provider]  aspirin EC 81 MG tablet Take 81 mg by mouth daily.    [provider]  atenolol (TENORMIN) 100 MG tablet Take 100 mg by mouth daily. 10/10/14   [provider]  Calcium Carbonate-Vit D-Min (CALTRATE 600+D PLUS PO) Take 1 tablet by mouth daily.     [provider]  diphenhydrAMINE (BENADRYL) 50 MG tablet Take 0.5 tablets (25 mg total) by mouth every 6 (six) hours as needed for itching. 01/12/17   Schuyler Amor, MD  diphenhydrAMINE-zinc acetate (BENADRYL) cream Apply 1 g topically daily as needed for itching.    [provider]  GLUCOSAMINE-CHONDROITIN PO Take 2 tablets by mouth daily. Patient not taking: No sig reported    [provider]  hydrochlorothiazide (HYDRODIURIL) 25 MG tablet Take 25 mg by mouth daily. 10/10/14   [provider]  Multiple Vitamin (MULTI-VITAMINS) TABS Take 1 tablet by mouth daily.     [provider]  potassium chloride SA (KLOR-CON) 20 MEQ tablet 20 mEq daily. 08/05/14   [provider]  simvastatin (ZOCOR) 10 MG tablet Take 10 mg by mouth daily at 6 PM.  06/06/14   [provider]  timolol (TIMOPTIC) 0.5 % ophthalmic solution Place 1 drop into both eyes daily.  Patient not taking: Reported on 04/30/2020    [provider]     Allergies:    Allergies  Allergen Reactions  . Ibuprofen Hives  . Penicillins Hives  . Aleve [Naproxen Sodium] Hives  . Avapro  [Irbesartan] Rash    Other reaction(s): Weal  . Azithromycin Hives  . Nickel Itching    Redness, itching and fluid discharge with nickel earrings  . Other Other (See Comments)    Nickel"ears ran fluid and itching" redness and "sensitive" Nickel"ears ran fluid and itching"    Social History:   Social History   Socioeconomic History  . Marital status: Married    Spouse name: Not on file  . Number of children: Not on file  . Years of education: Not on file  . Highest  education level: Not on file  Occupational History  . Not on file  Tobacco Use  . Smoking status: Former Research scientist (life sciences)  . Smokeless tobacco: Never Used  Substance and Sexual Activity  . Alcohol use: No    Alcohol/week: 0.0 standard drinks  . Drug use: No  . Sexual activity: Not on file  Other Topics Concern  . Not on file  Social History Narrative  . Not on file   Social Determinants of Health   Financial Resource Strain: Not on file  Food Insecurity: Not on file  Transportation Needs: Not on file  Physical Activity: Not on file  Stress: Not on file  Social Connections: Not on file  Intimate Partner Violence: Not on file    Family History:   The patient's family history includes Arthritis in her mother; Gout in her mother; Heart attack in her sister; Hypertension in her mother; Lung cancer in  her father.    ROS:  Please see the history of present illness.  All other ROS reviewed and negative.     Physical Exam/Data:  BP: 141/62, HR: 86 bpm, RR: 20, Temp: 98.1, Oxygen saturation: 95% on 2 L via nasal cannula  Last 3 Weights 04/30/2020 11/25/2019 11/16/2018  Weight (lbs) 131 lb 134 lb 141 lb 9.6 oz  Weight (kg) 59.421 kg 60.782 kg 64.229 kg     General:  Well nourished, well developed, in no acute distress HEENT: Normal Lymph: No adenopathy Neck: No JVD Endocrine:  No thryomegaly Vascular: No carotid bruits; FA pulses 2+ bilaterally without bruits  Cardiac:  Normal S1, S2; RRR; no murmur  Lungs:  Clear to auscultation bilaterally, no wheezing, rhonchi or rales  Abd: Soft, nontender, no hepatomegaly  Ext: No edema Musculoskeletal:  No deformities, BUE and BLE strength normal and equal Skin: Warm and dry  Neuro:  CNs 2-12 intact, no focal abnormalities noted Psych:  Normal affect    EKG:  The ECG that was done 04/30/2020 was personally reviewed and demonstrates NSR, 85 bpm, LBBB (new when compared to tracing in 2011)  Relevant CV Studies:  LHC 05/01/2020:  There is  moderate left ventricular systolic dysfunction.  LV end diastolic pressure is mildly elevated.  The left ventricular ejection fraction is 35-45% by visual estimate.  Ost RCA to Prox RCA lesion is 60% stenosed.  Mid RCA to Dist RCA lesion is 100% stenosed.  Ost LM to Dist LM lesion is 80% stenosed.  Ost Cx to Prox Cx lesion is 80% stenosed.  Ost LAD to Prox LAD lesion is 80% stenosed.   1.  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  Significant pressure dampening with catheter engagement of the left main coronary artery. 2.  Moderately reduced LV systolic function.  Mildly elevated left ventricular end-diastolic pressure.  Recommendations: Recommend transfer to [facility] for CABG evaluation. Obtain an echocardiogram to better evaluate LV systolic function and valvular abnormalities. Resume heparin 6 hours after sheath pull.   Diagnostic Dominance: Right  Left Main  Ost LM to Dist LM lesion is 80% stenosed. The lesion is severely calcified.  Left Anterior Descending  Ost LAD to Prox LAD lesion is 80% stenosed. The lesion is eccentric. The lesion is severely calcified.  Left Circumflex  Ost Cx to Prox Cx lesion is 80% stenosed. The lesion is severely calcified.  Right Coronary Artery  Ost RCA to Prox RCA lesion is 60% stenosed. The lesion is severely calcified.  Mid RCA to Dist RCA lesion is 100% stenosed.  Right Posterior Atrioventricular Artery  Collaterals  RPAV filled by collaterals from RV Branch.    First Right Posterolateral Branch  Collaterals  1st RPL filled by collaterals from Dist LAD.     Intervention   No interventions have been documented.  Wall Motion  Resting                Left Heart  Left Ventricle The left ventricular size is normal. There is moderate left ventricular systolic dysfunction. LV end diastolic pressure is mildly elevated. The left ventricular ejection fraction is 35-45% by visual estimate. There are LV  function abnormalities due to global hypokinesis.   Coronary Diagrams   Diagnostic Dominance: Right    Laboratory Data:  High Sensitivity Troponin:   Recent Labs  Lab 04/30/20 1238 04/30/20 1616  TROPONINIHS 19,201* 14,653*      Chemistry Recent Labs  Lab 04/30/20 1238 05/01/20 0555  NA  139 137  K 3.7 3.6  CL 104 106  CO2 25 25  GLUCOSE 107* 117*  BUN 8 7*  CREATININE 0.57 0.61  CALCIUM 9.2 8.9  GFRNONAA >60 >60  ANIONGAP 10 6    Recent Labs  Lab 05/01/20 0555  PROT 6.8  ALBUMIN 3.1*  AST 84*  ALT 23  ALKPHOS 61  BILITOT 1.6*   Hematology Recent Labs  Lab 04/30/20 1238 05/01/20 0555  WBC 7.9 5.5  RBC 3.98 3.74*  HGB 13.0 12.4  HCT 37.1 35.3*  MCV 93.2 94.4  MCH 32.7 33.2  MCHC 35.0 35.1  RDW 13.3 13.6  PLT 213 180   BNP Recent Labs  Lab 04/30/20 1238  BNP 646.9*    DDimer No results for input(s): DDIMER in the last 168 hours.   Radiology/Studies:  DG Chest 2 View  Result Date: 04/30/2020 IMPRESSION: Mass in the posterior left mid lung. Further evaluation with chest CT is recommended. Electronically Signed   By: Abelardo Diesel M.D.   On: 04/30/2020 11:58   CT Angio Chest PE W and/or Wo Contrast  Result Date: 04/30/2020 IMPRESSION: 1. No filling defect is identified in the pulmonary arterial tree to suggest pulmonary embolus. 2. Rounded consolidation in the left lower lobe surrounding an apparent 3.3 by 2.5 by 2.5 cm hypoenhancing lesion which could represent malignancy, abscess, or cavitation. Mildly enlarged left infrahilar lymph node. 3. Small left and trace right pleural effusions. 4. Mild cardiomegaly with suspected interstitial edema. 5. Airway thickening is present, suggesting bronchitis or reactive airways disease. 6. Nodular contour of the left hepatic lobe suggesting cirrhosis. Chronic prominent biliary tree, possibly a physiologic response to cholecystectomy. 7.  Aortic Atherosclerosis (ICD10-I70.0). Electronically Signed   By:  Van Clines M.D.   On: 04/30/2020 17:28   Assessment and Plan:   1.  NSTEMI: -Currently chest pain-free -LHC on 4/18 demonstrated severe calcification of the left main coronary artery and three-vessel CAD as outlined above.  There was significant pressure dampening with catheter engagement of the left main -Transfer to [facility] for cardiothoracic surgical evaluation for CABG -[facility] in to [facility] she was a DNR, which she indicates was placed after watching her husband pass.  She did resend this for her LHC.  In talking with her and her son at this morning she continues to agree to rescind this for procedures and surgeries and would overall want to be a full code unless that she ended up having multiple codes.  She would not want prolonged intubation.  Her son was present for this discussion and agrees.  In this setting I have changed her CODE STATUS from a DNR to FULL CODE at this time.  Both patient and son agree with this.  I have notified the clinical staff. -Resume heparin infusion 6 hours status post sheath pull -Obtain echo -Coreg, Lipitor  -As needed morphine and SL NTG  2.  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  HLD: -LDL 32 this admission -Lipitor  4. HTN: -Blood pressure is mildly elevated -Continue medical therapy as outlined above  5.  PVD: -Status post left lower extremity intervention  -No symptoms of critical limb ischemia -Follow-up with vascular surgery as directed   Risk Assessment/Risk Scores:     TIMI Risk Score for Unstable Angina or Non-ST Elevation MI:   The patient's TIMI risk score is 6, which indicates a 41%  risk of all cause mortality, new or recurrent myocardial infarction or need for urgent revascularization in the next 14 days.       Severity of Illness: The appropriate patient status for this patient is  INPATIENT. Inpatient status is judged to be reasonable and necessary in order to provide the required intensity of service to ensure the patient's safety. The patient's presenting symptoms, physical exam findings, and initial radiographic and laboratory data in the context of their chronic comorbidities is felt to place them at high risk for further clinical deterioration. Furthermore, it is not anticipated that the patient will be medically stable for discharge from the hospital within 2 midnights of admission. The following factors support the patient status of inpatient.   " The patient's presenting symptoms include as above. " The worrisome physical exam findings include as above. " The initial radiographic and laboratory data are worrisome because of as above. " The chronic co-morbidities include as above.   * I certify that at the point of admission it is my clinical judgment that the patient will require inpatient hospital care spanning beyond 2 midnights from the point of admission due to high intensity of service, high risk for further deterioration and high frequency of surveillance required.*    For questions or updates, please contact [place] Please consult [email or web address] for contact info under     Signed, Christell Faith, PA-C  05/01/2020 11:37 AM

## 2020-05-01 NOTE — H&P (View-Only) (Signed)
[address]       [place] [address]             [phone]        [name] [place] Medical Record #[id number] Date of Birth: Jun 22, 1940  Referring: Wellington Hampshire, MD Primary Care: Derinda Late, MD Primary Cardiologist:No primary care provider on file.  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  She had no prior history of heart disease.  She is a former smoker having quit approximately 20 years ago.  She reports having chest discomfort for several days but had an episode on the evening prior to admission that was much worse.  She described a sharp 10/10 pain in her left chest radiating to both arms.  She had associated clammy skin, nausea, shortness of breath, but no vomiting.  She presented to [facility] ED where EKG demonstrated sinus rhythm with a new left bundle branch block.  Her initial troponin was in excess of 19,000.  Chest x-ray demonstrated a mass in the posterior left lung.  This was followed up with a CTA that reveals a 3.3 x 2.5 x 2.5 cm left lower lobe lung mass that could represent "malignancy, cavitation, or abscess".  Her chest pain subsided in the ED, she was treated with aspirin and started on heparin infusion.  She was admitted to the hospital and cardiology consultation was provided.  Left heart catheterization was performed earlier today.  There was a significant pressure dampening of the catheter as it engaged the left main coronary artery that ultimately proved to be 80% stenosed.   Additionally, there was were 80% proximal lesions in both the LAD and circumflex coronary arteries.  The mid right coronary artery was totally occluded with left-to-right collaterals.  Left ventricular ejection fraction was estimated at 35 to 45%.  [name] was transferred to [facility] and our service has been kindly asked to evaluate her for consideration of coronary bypass grafting. Currently, [name] is resting in bed with her daughter and minister at the bedside.  She denies having any chest pain.     Current Activity/ Functional Status:   Zubrod Score: At the time of surgery this patient's most appropriate activity status/level should be described as: []     0    Normal activity, no symptoms [x]     1    Restricted in physical strenuous activity but ambulatory, able to do out light work []     2    Ambulatory and capable of self care, unable to do work activities, up and about                 more than 50%  Of the time                            []     3    Only limited self care, in bed greater than 50% of waking hours []     4    Completely disabled, no self care,  confined to bed or chair []     5    Moribund  Past Medical History:  Diagnosis Date  . Arthritis   . Breast cancer ([place]) 2011   radiation- Left  . Hypertension   . Personal history of radiation therapy   . PVD (peripheral vascular disease) ([place])     Past Surgical History:  Procedure Laterality Date  . ABDOMINAL HYSTERECTOMY    . BREAST BIOPSY Left 12/21/2009   positive, radiation dcis  . BREAST EXCISIONAL BIOPSY Left 01/17/2010   lumpectomy DCIS  . BREAST LUMPECTOMY    . CATARACT EXTRACTION W/ INTRAOCULAR LENS IMPLANT & ANTERIOR VITRECTOMY, BILATERAL    . CHOLECYSTECTOMY    . COLONOSCOPY WITH PROPOFOL N/A 09/25/2017   Procedure: COLONOSCOPY WITH PROPOFOL;  Surgeon: Lollie Sails, MD;  Location: [facility] ENDOSCOPY;  Service: Endoscopy;  Laterality: N/A;  . DILATION AND CURETTAGE OF UTERUS    . JOINT  REPLACEMENT Right 01/24/2016  . KNEE ARTHROPLASTY Right 01/24/2016   Procedure: COMPUTER ASSISTED TOTAL KNEE ARTHROPLASTY;  Surgeon: Dereck Leep, MD;  Location: [facility] ORS;  Service: Orthopedics;  Laterality: Right;  . LEFT HEART CATH AND CORONARY ANGIOGRAPHY N/A 05/01/2020   Procedure: LEFT HEART CATH AND CORONARY ANGIOGRAPHY;  Surgeon: Wellington Hampshire, MD;  Location: [facility] CV LAB;  Service: Cardiovascular;  Laterality: N/A;  . stent in Lt leg Left     Social History   Tobacco Use  Smoking Status Former Smoker  Smokeless Tobacco Never Used    Social History   Substance and Sexual Activity  Alcohol Use No  . Alcohol/week: 0.0 standard drinks     Allergies  Allergen Reactions  . Ibuprofen Hives  . Penicillins Hives  . Aleve [Naproxen Sodium] Hives  . Avapro  [Irbesartan] Rash    Other reaction(s): Weal  . Azithromycin Hives  . Nickel Itching    Redness, itching and fluid discharge with nickel earrings  . Other Other (See Comments)    Nickel"ears ran fluid and itching" redness and "sensitive" Nickel"ears ran fluid and itching"    No current facility-administered medications for this encounter.    Medications Prior to Admission  Medication Sig Dispense Refill Last Dose  . acetaminophen (TYLENOL) 500 MG tablet Take 1,000 mg by mouth every 6 (six) hours as needed for mild pain.     Marland Kitchen aspirin EC 81 MG tablet Take 81 mg by mouth daily.     Marland Kitchen atenolol (TENORMIN) 100 MG tablet Take 100 mg by mouth daily.     Marland Kitchen atorvastatin (LIPITOR) 20 MG tablet Take 2 tablets (40 mg total) by mouth at bedtime.     . Calcium Carbonate-Vit D-Min (CALTRATE 600+D PLUS PO) Take 1 tablet by mouth daily.      . diphenhydrAMINE (BENADRYL) 50 MG tablet Take 0.5 tablets (25 mg total) by mouth every 6 (six) hours as needed for itching. 30 tablet 0   . diphenhydrAMINE-zinc acetate (BENADRYL) cream Apply 1 g topically daily as needed for itching.     Marland Kitchen GLUCOSAMINE-CHONDROITIN PO Take 2 tablets  by mouth daily. (Patient not taking: No sig reported)     . heparin 25000 UT/250ML infusion Inject 950 Units/hr into the vein continuous.     . Multiple Vitamin (MULTI-VITAMINS) TABS Take 1 tablet by mouth daily.      . nitroGLYCERIN (NITROGLYN) 2 % ointment Apply 1 inch topically every 6 (six) hours. 30 g 0   . timolol (TIMOPTIC) 0.5 % ophthalmic solution Place 1 drop into  both eyes daily.  (Patient not taking: Reported on 04/30/2020)       Family History  Problem Relation Age of Onset  . Hypertension Mother   . Arthritis Mother   . Gout Mother   . Lung cancer Father   . Heart attack Sister      Review of Systems:   ROS     Cardiac Review of Systems: Y or  [    ]= no  Chest Pain [ x   ]  Resting SOB [   ] Exertional SOB  [  ]  Orthopnea [  ]   Pedal Edema [   ]    Palpitations [  ] Syncope  [  ]   Presyncope [   ]  General Review of Systems: [Y] = yes [  ]=no Constitional: recent weight change [  ]; anorexia [  ]; fatigue [  ]; nausea [x  ]; night sweats [  ]; fever [  ]; or chills [  ]                                                               Dental: Has full plate dentures above and Sims  Eye : blurred vision [  ]; diplopia [   ]; vision changes [  ];  Amaurosis fugax[  ]; Resp: cough [  ];  wheezing[  ];  hemoptysis[  ]; shortness of breath[  ]; paroxysmal nocturnal dyspnea[  ]; dyspnea on exertion[  ]; or orthopnea[  ];  GI:  gallstones[  ], vomiting[  ];  dysphagia[  ]; melena[  ];  hematochezia [  ]; heartburn[  ];   Hx of  Colonoscopy[  ]; GU: kidney stones [  ]; hematuria[  ];   dysuria [  ];  nocturia[  ];  history of     obstruction [  ]; urinary frequency [  ]             Skin: rash, swelling[  ];, hair loss[  ];  peripheral edema[  ];  or itching[  ]; Musculosketetal: myalgias[  ];  joint swelling[  ];  joint erythema[  ];  joint pain[  ];  back pain[  ];  Heme/Lymph: bruising[  ];  bleeding[  ];  anemia[  ];  Neuro: TIA[  ];  headaches[  ];  stroke[  ];  vertigo[   ];  seizures[  ];   paresthesias[  ];  difficulty walking[  ];  Psych:depression[  ]; anxiety[  ];  Endocrine: diabetes[  ];  thyroid dysfunction[  ];              Physical Exam: Blood pressure 142/66, pulse 89, respirations 15, O2 saturation 100% on 2 L nasal cannula  General appearance: alert, cooperative and no distress Head: Normocephalic, without obvious abnormality, atraumatic Neck: no adenopathy, no carotid bruit, no JVD, supple, symmetrical, trachea midline and thyroid not enlarged, symmetric, no tenderness/mass/nodules Lymph nodes: No cervical or clavicular adenopathy Resp: clear to auscultation bilaterally Cardio: regular rate and rhythm, S1, S2 normal, no murmur, click, rub or gallop GI: Soft and nontender, active bowel sounds. Extremities: extremities normal, atraumatic, no cyanosis or edema and There are palpable peripheral pulses throughout.  Modified Allen's test using  pulse oximetry on the left index finger shows normal oximetry and waveform while the radial artery is compressed. Neurologic: Grossly normal  Diagnostic Studies & Laboratory data:  LEFT HEART CATH AND CORONARY ANGIOGRAPHY    Conclusion    There is moderate left ventricular systolic dysfunction.  LV end diastolic pressure is mildly elevated.  The left ventricular ejection fraction is 35-45% by visual estimate.  Ost RCA to Prox RCA lesion is 60% stenosed.  Mid RCA to Dist RCA lesion is 100% stenosed.  Ost LM to Dist LM lesion is 80% stenosed.  Ost Cx to Prox Cx lesion is 80% stenosed.  Ost LAD to Prox LAD lesion is 80% stenosed.   1.  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  Significant pressure dampening with catheter engagement of the left main coronary artery. 2.  Moderately reduced LV systolic function.  Mildly elevated left ventricular end-diastolic pressure.  Recommendations: Recommend transfer to [facility] for CABG evaluation. Obtain an echocardiogram to  better evaluate LV systolic function and valvular abnormalities. Resume heparin 6 hours after sheath pull.   Indications  Non-ST elevation (NSTEMI) myocardial infarction ([place]) [I21.4 (ICD-10-CM)]   Procedural Details  Technical Details Procedural Details: The right wrist was prepped, draped, and anesthetized with 1% lidocaine. Using the modified Seldinger technique, a 5 French sheath was introduced into the right radial artery. 2.5 mg of verapamil was administered through the sheath, weight-based unfractionated heparin was administered intravenously. A Jackie catheter was used for selective coronary angiography and left ventriculography.  A JL 3.5 catheter was needed to engage the left main coronary artery.  There was significant pressure dampening with left main catheter engagement.  Catheter exchanges were performed over an exchange length guidewire. There were no immediate procedural complications. A TR band was used for radial hemostasis at the completion of the procedure.  The patient was transferred to the post catheterization recovery area for further monitoring.  Estimated blood loss <50 mL.   During this procedure medications were administered to achieve and maintain moderate conscious sedation while the patient's heart rate, blood pressure, and oxygen saturation were continuously monitored and I was present face-to-face 100% of this time.   Medications (Filter: Administrations occurring from 0920 to 0958 on 05/01/20) (important) Continuous medications are totaled by the amount administered until 05/01/20 0958.    fentaNYL (SUBLIMAZE) injection (mcg) Total dose:  25 mcg  Date/Time Rate/Dose/Volume Action   05/01/20 0926 25 mcg Given    midazolam (VERSED) injection (mg) Total dose:  1 mg  Date/Time Rate/Dose/Volume Action   05/01/20 0926 1 mg Given    Heparin (Porcine) in NaCl 1000-0.9 UT/500ML-% SOLN (mL) Total volume:  1,000 mL  Date/Time Rate/Dose/Volume Action    05/01/20 0932 500 mL Given   0932 500 mL Given    verapamil (ISOPTIN) injection (mg) Total dose:  2.5 mg  Date/Time Rate/Dose/Volume Action   05/01/20 0934 2.5 mg Given    heparin sodium (porcine) injection (Units) Total dose:  3,000 Units  Date/Time Rate/Dose/Volume Action   05/01/20 0936 3,000 Units Given    iohexol (OMNIPAQUE) 300 MG/ML solution (mL) Total volume:  73 mL  Date/Time Rate/Dose/Volume Action   05/01/20 0952 73 mL Given    Sedation Time  Sedation Time Physician-1: 24 minutes 57 seconds   Contrast  Medication Name Total Dose  iohexol (OMNIPAQUE) 300 MG/ML solution 73 mL    Radiation/Fluoro  Fluoro time: 5.5 (min) DAP: 28.4 (Gycm2) Cumulative Air Kerma: 476 (mGy)   Coronary Findings  Diagnostic Dominance: Right  Left Main  Ost LM to Dist LM lesion is 80% stenosed. The lesion is severely calcified.  Left Anterior Descending  Ost LAD to Prox LAD lesion is 80% stenosed. The lesion is eccentric. The lesion is severely calcified.  Left Circumflex  Ost Cx to Prox Cx lesion is 80% stenosed. The lesion is severely calcified.  Right Coronary Artery  Ost RCA to Prox RCA lesion is 60% stenosed. The lesion is severely calcified.  Mid RCA to Dist RCA lesion is 100% stenosed.  Right Posterior Atrioventricular Artery  Collaterals  RPAV filled by collaterals from RV Branch.    First Right Posterolateral Branch  Collaterals  1st RPL filled by collaterals from Dist LAD.     Intervention   No interventions have been documented.  Wall Motion  Resting                Left Heart  Left Ventricle The left ventricular size is normal. There is moderate left ventricular systolic dysfunction. LV end diastolic pressure is mildly elevated. The left ventricular ejection fraction is 35-45% by visual estimate. There are LV function abnormalities due to global hypokinesis.   Coronary Diagrams   Diagnostic Dominance: Right          Recent  Radiology Findings:   DG Chest 2 View  Result Date: 04/30/2020 CLINICAL DATA:  Chest pain EXAM: CHEST - 2 VIEW COMPARISON:  None. FINDINGS: There is a 6 x 6.6 x 6.2 cm mass in the posterior left mid lung. There is no pleural effusion. The right lung is clear. The mediastinal contour and cardiac silhouette are normal. Degenerative joint changes of the spine are identified. IMPRESSION: Mass in the posterior left mid lung. Further evaluation with chest CT is recommended. Electronically Signed   By: Abelardo Diesel M.D.   On: 04/30/2020 11:58   CLINICAL DATA:  Substernal chest pain.  Nausea.  Diaphoresis.  EXAM: CT ANGIOGRAPHY CHEST WITH CONTRAST  TECHNIQUE: Multidetector CT imaging of the chest was performed using the standard protocol during bolus administration of intravenous contrast. Multiplanar CT image reconstructions and MIPs were obtained to evaluate the vascular anatomy.  CONTRAST:  71mL OMNIPAQUE IOHEXOL 350 MG/ML SOLN  COMPARISON:  Chest radiograph 04/30/2020  FINDINGS: Cardiovascular: No filling defect is identified in the pulmonary arterial tree to suggest pulmonary embolus. Coronary, aortic arch, and branch vessel atherosclerotic vascular disease.  Mediastinum/Nodes: Calcified subcarinal lymph nodes are present. Indistinct right lower paratracheal node 0.8 cm in short axis on image 65 series 5. Left infrahilar node 1.0 cm in short axis image 100 series 5. Mild cardiomegaly.  Lungs/Pleura: Small left and trace right pleural effusions.  A rounded masslike lesion posteriorly in the left lower lobe measures 6.6 by 4.1 by 5.5 cm. Some of this represents atelectatic lung or consolidation based on pulmonary vessels tracking within the rounded region. However, there also appears to be a mildly hypodense approximately 3.3 by 2.5 by 2.5 cm portion of this lesion on image 34 series 4 that does not have internal vessels and which could represent an underlying malignancy,  abscess, or region of cavitation.  Airway thickening is present, suggesting bronchitis or reactive airways disease. Interstitial accentuation suggesting mild interstitial edema at the lung apices, with some patchy heterogeneity in the lungs which could be from centrilobular emphysema or air trapping.  Upper Abdomen: Nodular contour of the lateral segment left hepatic lobe, query cirrhosis. Prominent common hepatic duct. Cholecystectomy. Abdominal aortic atherosclerosis.  Musculoskeletal: Thoracic spondylosis.  Review  of the MIP images confirms the above findings.  IMPRESSION: 1. No filling defect is identified in the pulmonary arterial tree to suggest pulmonary embolus. 2. Rounded consolidation in the left lower lobe surrounding an apparent 3.3 by 2.5 by 2.5 cm hypoenhancing lesion which could represent malignancy, abscess, or cavitation. Mildly enlarged left infrahilar lymph node. 3. Small left and trace right pleural effusions. 4. Mild cardiomegaly with suspected interstitial edema. 5. Airway thickening is present, suggesting bronchitis or reactive airways disease. 6. Nodular contour of the left hepatic lobe suggesting cirrhosis. Chronic prominent biliary tree, possibly a physiologic response to cholecystectomy. 7.  Aortic Atherosclerosis (ICD10-I70.0).   Electronically Signed   By: Van Clines M.D.   On: 04/30/2020 17:28      I have independently reviewed the above radiologic studies and discussed with the patient   Recent Lab Findings: Lab Results  Component Value Date   WBC 5.5 05/01/2020   HGB 12.4 05/01/2020   HCT 35.3 (L) 05/01/2020   PLT 180 05/01/2020   GLUCOSE 117 (H) 05/01/2020   CHOL 99 05/01/2020   TRIG 190 (H) 05/01/2020   HDL 29 (L) 05/01/2020   LDLCALC 32 05/01/2020   ALT 23 05/01/2020   AST 84 (H) 05/01/2020   NA 137 05/01/2020   K 3.6 05/01/2020   CL 106 05/01/2020   CREATININE 0.61 05/01/2020   BUN 7 (L) 05/01/2020   CO2  25 05/01/2020   TSH 2.469 05/01/2020   INR 1.1 04/30/2020      Assessment / Plan:      -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  Coronary bypass grafting is her best option for revascularization.  The procedure and expected perioperative course were discussed with her and her daughter in detail and their questions were answered.  She would like for [place] to proceed with preoperative evaluation in preparation for surgery. Clinical data will be reviewed by Dr. Roxan Hockey and recommendations regarding timing of surgery will follow.  -History of ductal carcinoma in situ, status post left breast lumpectomy.  The admission the CTA demonstrated a 3.3 x 2.5 x 2.5 rounded left lower lobe mass that could represent a malignancy, abscess, or cavitation.  She has had some cough lately but no hemoptysis.  This lesion will need further work-up.  -History of peripheral vascular disease, status post left iliac artery stenting in 2000.  Recent ABIs obtained within the past 6 months were normal.  -History of hypertension  -History of dyslipidemia  -Former tobacco smoker, quit 20 years ago.    I  spent 30 minutes counseling the patient face to face.   Antony Odea, PA-C  05/01/2020 5:33 PM  [name] is a 80 year old woman with multiple cardiac risk factors including hypertension, hyperlipidemia, remote tobacco abuse, and PAD.  She quit smoking about 20 years ago.  She has no prior history of coronary disease.  She presented with unstable chest pain.  She ruled in for non-ST elevation MI.  Catheterization showed severe left main and three-vessel coronary disease.  She has a small codominant RCA.  The left-sided vessels are large and appear to be good quality targets.    Coronary bypass grafting is indicated  for survival benefit and relief of symptoms.  I discussed the general nature of the procedure with the patient and her daughter.  They understand the need for general  anesthesia, the incisions to be used, the use of cardiopulmonary bypass, the expected hospital stay, and the overall recovery.  I informed her of the indications, risks, benefits, and alternatives.  She understands the risks include, but are not limited to death, MI, DVT, PE, bleeding, possible need for transfusion, infection, cardiac arrhythmias, respiratory or renal failure, as well as the possibility of other unstable complications.  She accepts the risks and agrees to proceed.  She was found to have a rather large and unusual appearing left lower lobe lung mass.  This mass will not be accessible at the time of surgery, and will need to be worked up further postoperatively.  She has severe atherosclerotic cardiovascular disease involving the thoracic aorta, especially in the arch and descending.  She is at higher risk for stroke due to her atherosclerotic disease in the arch.  Revonda Standard Roxan Hockey, MD [facility] [phone]

## 2020-05-01 NOTE — Consult Note (Signed)
[place] for heparin Indication: chest pain/ACS  Allergies  Allergen Reactions  . Ibuprofen Hives  . Penicillins Hives  . Aleve [Naproxen Sodium] Hives  . Avapro  [Irbesartan] Rash    Other reaction(s): Weal  . Azithromycin Hives  . Nickel Itching    Redness, itching and fluid discharge with nickel earrings  . Other Other (See Comments)    Nickel"ears ran fluid and itching" redness and "sensitive" Nickel"ears ran fluid and itching"    Patient Measurements: Height: 5' (152.4 cm) Weight: 59.4 kg (131 lb) IBW/kg (Calculated) : 45.5 Heparin Dosing Weight: 59.4  Vital Signs: BP: 132/59 (04/18 0000) Pulse Rate: 82 (04/18 0000)  Labs: Recent Labs    04/30/20 1142 04/30/20 1238 04/30/20 1616 04/30/20 2218  HGB  --  13.0  --   --   HCT  --  37.1  --   --   PLT  --  213  --   --   APTT 27  --   --   --   LABPROT 13.7  --   --   --   INR 1.1  --   --   --   HEPARINUNFRC  --   --   --  <0.10*  CREATININE  --  0.57  --   --   TROPONINIHS  --  19,201* 14,653*  --     Estimated Creatinine Clearance: 47.5 mL/min (by C-G formula based on SCr of 0.57 mg/dL).   Medical History: Past Medical History:  Diagnosis Date  . Arthritis   . Breast cancer ([place]) 2011   radiation- Left  . Hypertension   . Personal history of radiation therapy   . PVD (peripheral vascular disease) ([facility])     Medications:  No anticoagulants prior to admission per chart review   Assessment: 80 yo F presented with chest pain radiating into arm and diaphoresis. PMH includes PVD and HTN. Pharmacy has been consulted to dose heparin for ACS/chest pain. Planning for heart cath 4/18. Trops 19201.   4/17 2218 HL < 0.1  Goal of Therapy:  Heparin level 0.3-0.7 units/ml Monitor platelets by anticoagulation protocol: Yes   Plan:  Heparin level is subtherapeutic. Will give 2000 units bolus and increase heparin infusion to 950 units/hr. Recheck heparin level and CBC  in 8 hours.   Oswald Hillock, PharmD 05/01/2020 12:17 AM

## 2020-05-01 NOTE — Progress Notes (Signed)
Pt transported to 4E17 via [facility].  CHG completed, tele initiated.  VSS.  Orders released.

## 2020-05-01 NOTE — Consult Note (Signed)
ANTICOAGULATION CONSULT NOTE   Pharmacy Consult for heparin Indication: chest pain/ACS  Patient Measurements: Height: 5' (152.4 cm) Weight: 59.4 kg (131 lb) IBW/kg (Calculated) : 45.5 Heparin Dosing Weight: 59.4  Vital Signs: BP: 140/57 (04/18 0718) Pulse Rate: 89 (04/18 0718)  Labs: Recent Labs    04/30/20 1142 04/30/20 1238 04/30/20 1616 04/30/20 2218 05/01/20 0555  HGB  --  13.0  --   --  12.4  HCT  --  37.1  --   --  35.3*  PLT  --  213  --   --  180  APTT 27  --   --   --   --   LABPROT 13.7  --   --   --   --   INR 1.1  --   --   --   --   HEPARINUNFRC  --   --   --  <0.10*  --   CREATININE  --  0.57  --   --  0.61  TROPONINIHS  --  19,201* 33,435*  --   --     Estimated Creatinine Clearance: 47.5 mL/min (by C-G formula based on SCr of 0.61 mg/dL).   Medical History: Past Medical History:  Diagnosis Date  . Arthritis   . Breast cancer ([place]) 2011   radiation- Left  . Hypertension   . Personal history of radiation therapy   . PVD (peripheral vascular disease) (H[facility])     Medications:  No anticoagulants prior to admission per chart review   Assessment: 80 yo F presented with chest pain radiating into arm and diaphoresis. PMH includes PVD and HTN. Pharmacy has been consulted to dose heparin for ACS/chest pain. Heparin was paused this morning for LHC. The plan is to transfer to [facility] for cardiothoracic surgical evaluation for CABG. Cardiology has asked for heparin to be restarted at 1600 today.  Goal of Therapy:  Heparin level 0.3-0.7 units/ml Monitor platelets by anticoagulation protocol: Yes   Plan:   Restart heparin infusion at 950 units/hr beginning at 1600  Recheck heparin level in 8 hours after heparin has been restarted   Dallie Piles, PharmD 05/01/2020 7:30 AM

## 2020-05-01 NOTE — Consult Note (Addendum)
[address]       [place] [address]             [phone]        [place] Medical Record #[id number] Date of Birth: 04/05/1940  Referring: Wellington Hampshire, MD Primary Care: Derinda Late, MD Primary Cardiologist:No primary care provider on file.  Chief Complaint: Multivessel coronary artery disease presenting with acute non-ST elevation myocardial infarction and new left bundle branch block   History of Present Illness:     Ms. 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  She had no prior history of heart disease.  She is a former smoker having quit approximately 20 years ago.  She reports having chest discomfort for several days but had an episode on the evening prior to admission that was much worse.  She described a sharp 10/10 pain in her left chest radiating to both arms.  She had associated clammy skin, nausea, shortness of breath, but no vomiting.  She presented to [facility] ED where EKG demonstrated sinus rhythm with a new left bundle branch block.  Her initial troponin was in excess of 19,000.  Chest x-ray demonstrated a mass in the posterior left lung.  This was followed up with a CTA that reveals a 3.3 x 2.5 x 2.5 cm left lower lobe lung mass that could represent "malignancy, cavitation, or abscess".  Her chest pain subsided in the ED, she was treated with aspirin and started on heparin infusion.  She was admitted to the hospital and cardiology consultation was provided.  Left heart catheterization was performed earlier today.  There was a significant pressure dampening of the catheter as it engaged the left main coronary artery that ultimately proved to be 80% stenosed.   Additionally, there was were 80% proximal lesions in both the LAD and circumflex coronary arteries.  The mid right coronary artery was totally occluded with left-to-right collaterals.  Left ventricular ejection fraction was estimated at 35 to 45%.  Ms. [name] was transferred to [facility] and our service has been kindly asked to evaluate her for consideration of coronary bypass grafting. Currently, Ms. [name] is resting in bed with her daughter and minister at the bedside.  She denies having any chest pain.     Current Activity/ Functional Status:   Zubrod Score: At the time of surgery this patient's most appropriate activity status/level should be described as: []     0    Normal activity, no symptoms [x]     1    Restricted in physical strenuous activity but ambulatory, able to do out light work []     2    Ambulatory and capable of self care, unable to do work activities, up and about                 more than 50%  Of the time                            []     3    Only limited self care, in bed greater than 50% of waking hours []     4    Completely disabled, no self care,  confined to bed or chair []     5    Moribund  Past Medical History:  Diagnosis Date  . Arthritis   . Breast cancer ([place]) 2011   radiation- Left  . Hypertension   . Personal history of radiation therapy   . PVD (peripheral vascular disease) ([place])     Past Surgical History:  Procedure Laterality Date  . ABDOMINAL HYSTERECTOMY    . BREAST BIOPSY Left 12/21/2009   positive, radiation dcis  . BREAST EXCISIONAL BIOPSY Left 01/17/2010   lumpectomy DCIS  . BREAST LUMPECTOMY    . CATARACT EXTRACTION W/ INTRAOCULAR LENS IMPLANT & ANTERIOR VITRECTOMY, BILATERAL    . CHOLECYSTECTOMY    . COLONOSCOPY WITH PROPOFOL N/A 09/25/2017   Procedure: COLONOSCOPY WITH PROPOFOL;  Surgeon: Lollie Sails, MD;  Location: [facility] ENDOSCOPY;  Service: Endoscopy;  Laterality: N/A;  . DILATION AND CURETTAGE OF UTERUS    . JOINT  REPLACEMENT Right 01/24/2016  . KNEE ARTHROPLASTY Right 01/24/2016   Procedure: COMPUTER ASSISTED TOTAL KNEE ARTHROPLASTY;  Surgeon: Dereck Leep, MD;  Location: [facility] ORS;  Service: Orthopedics;  Laterality: Right;  . LEFT HEART CATH AND CORONARY ANGIOGRAPHY N/A 05/01/2020   Procedure: LEFT HEART CATH AND CORONARY ANGIOGRAPHY;  Surgeon: Wellington Hampshire, MD;  Location: [facility];  Service: Cardiovascular;  Laterality: N/A;  . stent in Lt leg Left     Social History   Tobacco Use  Smoking Status Former Smoker  Smokeless Tobacco Never Used    Social History   Substance and Sexual Activity  Alcohol Use No  . Alcohol/week: 0.0 standard drinks     Allergies  Allergen Reactions  . Ibuprofen Hives  . Penicillins Hives  . Aleve [Naproxen Sodium] Hives  . Avapro  [Irbesartan] Rash    Other reaction(s): Weal  . Azithromycin Hives  . Nickel Itching    Redness, itching and fluid discharge with nickel earrings  . Other Other (See Comments)    Nickel"ears ran fluid and itching" redness and "sensitive" Nickel"ears ran fluid and itching"    No current facility-administered medications for this encounter.    Medications Prior to Admission  Medication Sig Dispense Refill Last Dose  . acetaminophen (TYLENOL) 500 MG tablet Take 1,000 mg by mouth every 6 (six) hours as needed for mild pain.     Marland Kitchen aspirin EC 81 MG tablet Take 81 mg by mouth daily.     Marland Kitchen atenolol (TENORMIN) 100 MG tablet Take 100 mg by mouth daily.     Marland Kitchen atorvastatin (LIPITOR) 20 MG tablet Take 2 tablets (40 mg total) by mouth at bedtime.     . Calcium Carbonate-Vit D-Min (CALTRATE 600+D PLUS PO) Take 1 tablet by mouth daily.      . diphenhydrAMINE (BENADRYL) 50 MG tablet Take 0.5 tablets (25 mg total) by mouth every 6 (six) hours as needed for itching. 30 tablet 0   . diphenhydrAMINE-zinc acetate (BENADRYL) cream Apply 1 g topically daily as needed for itching.     Marland Kitchen GLUCOSAMINE-CHONDROITIN PO Take 2 tablets  by mouth daily. (Patient not taking: No sig reported)     . heparin 25000 UT/250ML infusion Inject 950 Units/hr into the vein continuous.     . Multiple Vitamin (MULTI-VITAMINS) TABS Take 1 tablet by mouth daily.      . nitroGLYCERIN (NITROGLYN) 2 % ointment Apply 1 inch topically every 6 (six) hours. 30 g 0   . timolol (TIMOPTIC) 0.5 % ophthalmic solution Place 1 drop into  both eyes daily.  (Patient not taking: Reported on 04/30/2020)       Family History  Problem Relation Age of Onset  . Hypertension Mother   . Arthritis Mother   . Gout Mother   . Lung cancer Father   . Heart attack Sister      Review of Systems:   ROS     Cardiac Review of Systems: Y or  [    ]= no  Chest Pain [ x   ]  Resting SOB [   ] Exertional SOB  [  ]  Orthopnea [  ]   Pedal Edema [   ]    Palpitations [  ] Syncope  [  ]   Presyncope [   ]  General Review of Systems: [Y] = yes [  ]=no Constitional: recent weight change [  ]; anorexia [  ]; fatigue [  ]; nausea [x  ]; night sweats [  ]; fever [  ]; or chills [  ]                                                               Dental: Has full plate dentures above and below  Eye : blurred vision [  ]; diplopia [   ]; vision changes [  ];  Amaurosis fugax[  ]; Resp: cough [  ];  wheezing[  ];  hemoptysis[  ]; shortness of breath[  ]; paroxysmal nocturnal dyspnea[  ]; dyspnea on exertion[  ]; or orthopnea[  ];  GI:  gallstones[  ], vomiting[  ];  dysphagia[  ]; melena[  ];  hematochezia [  ]; heartburn[  ];   Hx of  Colonoscopy[  ]; GU: kidney stones [  ]; hematuria[  ];   dysuria [  ];  nocturia[  ];  history of     obstruction [  ]; urinary frequency [  ]             Skin: rash, swelling[  ];, hair loss[  ];  peripheral edema[  ];  or itching[  ]; Musculosketetal: myalgias[  ];  joint swelling[  ];  joint erythema[  ];  joint pain[  ];  back pain[  ];  Heme/Lymph: bruising[  ];  bleeding[  ];  anemia[  ];  Neuro: TIA[  ];  headaches[  ];  stroke[  ];  vertigo[   ];  seizures[  ];   paresthesias[  ];  difficulty walking[  ];  Psych:depression[  ]; anxiety[  ];  Endocrine: diabetes[  ];  thyroid dysfunction[  ];              Physical Exam: Blood pressure 142/66, pulse 89, respirations 15, O2 saturation 100% on 2 L nasal cannula  General appearance: alert, cooperative and no distress Head: Normocephalic, without obvious abnormality, atraumatic Neck: no adenopathy, no carotid bruit, no JVD, supple, symmetrical, trachea midline and thyroid not enlarged, symmetric, no tenderness/mass/nodules Lymph nodes: No cervical or clavicular adenopathy Resp: clear to auscultation bilaterally Cardio: regular rate and rhythm, S1, S2 normal, no murmur, click, rub or gallop GI: Soft and nontender, active bowel sounds. Extremities: extremities normal, atraumatic, no cyanosis or edema and There are palpable peripheral pulses throughout.  Modified Allen's test using  pulse oximetry on the left index finger shows normal oximetry and waveform while the radial artery is compressed. Neurologic: Grossly normal  Diagnostic Studies & Laboratory data:  LEFT HEART CATH AND CORONARY ANGIOGRAPHY    Conclusion    There is moderate left ventricular systolic dysfunction.  LV end diastolic pressure is mildly elevated.  The left ventricular ejection fraction is 35-45% by visual estimate.  Ost RCA to Prox RCA lesion is 60% stenosed.  Mid RCA to Dist RCA lesion is 100% stenosed.  Ost LM to Dist LM lesion is 80% stenosed.  Ost Cx to Prox Cx lesion is 80% stenosed.  Ost LAD to Prox LAD lesion is 80% stenosed.   1.  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  Significant pressure dampening with catheter engagement of the left main coronary artery. 2.  Moderately reduced LV systolic function.  Mildly elevated left ventricular end-diastolic pressure.  Recommendations: Recommend transfer to [facility] for CABG evaluation. Obtain an echocardiogram to  better evaluate LV systolic function and valvular abnormalities. Resume heparin 6 hours after sheath pull.   Indications  Non-ST elevation (NSTEMI) myocardial infarction ([place]) [I21.4 (ICD-10-CM)]   Procedural Details  Technical Details Procedural Details: The right wrist was prepped, draped, and anesthetized with 1% lidocaine. Using the modified Seldinger technique, a 5 French sheath was introduced into the right radial artery. 2.5 mg of verapamil was administered through the sheath, weight-based unfractionated heparin was administered intravenously. A Jackie catheter was used for selective coronary angiography and left ventriculography.  A JL 3.5 catheter was needed to engage the left main coronary artery.  There was significant pressure dampening with left main catheter engagement.  Catheter exchanges were performed over an exchange length guidewire. There were no immediate procedural complications. A TR band was used for radial hemostasis at the completion of the procedure.  The patient was transferred to the post catheterization recovery area for further monitoring.  Estimated blood loss <50 mL.   During this procedure medications were administered to achieve and maintain moderate conscious sedation while the patient's heart rate, blood pressure, and oxygen saturation were continuously monitored and I was present face-to-face 100% of this time.   Medications (Filter: Administrations occurring from 0920 to 0958 on 05/01/20) (important) Continuous medications are totaled by the amount administered until 05/01/20 0958.    fentaNYL (SUBLIMAZE) injection (mcg) Total dose:  25 mcg  Date/Time Rate/Dose/Volume Action   05/01/20 0926 25 mcg Given    midazolam (VERSED) injection (mg) Total dose:  1 mg  Date/Time Rate/Dose/Volume Action   05/01/20 0926 1 mg Given    Heparin (Porcine) in NaCl 1000-0.9 UT/500ML-% SOLN (mL) Total volume:  1,000 mL  Date/Time Rate/Dose/Volume Action    05/01/20 0932 500 mL Given   0932 500 mL Given    verapamil (ISOPTIN) injection (mg) Total dose:  2.5 mg  Date/Time Rate/Dose/Volume Action   05/01/20 0934 2.5 mg Given    heparin sodium (porcine) injection (Units) Total dose:  3,000 Units  Date/Time Rate/Dose/Volume Action   05/01/20 0936 3,000 Units Given    iohexol (OMNIPAQUE) 300 MG/ML solution (mL) Total volume:  73 mL  Date/Time Rate/Dose/Volume Action   05/01/20 0952 73 mL Given    Sedation Time  Sedation Time Physician-1: 24 minutes 57 seconds   Contrast  Medication Name Total Dose  iohexol (OMNIPAQUE) 300 MG/ML solution 73 mL    Radiation/Fluoro  Fluoro time: 5.5 (min) DAP: 28.4 (Gycm2) Cumulative Air Kerma: 476 (mGy)   Coronary Findings  Diagnostic Dominance: Right  Left Main  Ost LM to Dist LM lesion is 80% stenosed. The lesion is severely calcified.  Left Anterior Descending  Ost LAD to Prox LAD lesion is 80% stenosed. The lesion is eccentric. The lesion is severely calcified.  Left Circumflex  Ost Cx to Prox Cx lesion is 80% stenosed. The lesion is severely calcified.  Right Coronary Artery  Ost RCA to Prox RCA lesion is 60% stenosed. The lesion is severely calcified.  Mid RCA to Dist RCA lesion is 100% stenosed.  Right Posterior Atrioventricular Artery  Collaterals  RPAV filled by collaterals from RV Branch.    First Right Posterolateral Branch  Collaterals  1st RPL filled by collaterals from Dist LAD.     Intervention   No interventions have been documented.  Wall Motion  Resting                Left Heart  Left Ventricle The left ventricular size is normal. There is moderate left ventricular systolic dysfunction. LV end diastolic pressure is mildly elevated. The left ventricular ejection fraction is 35-45% by visual estimate. There are LV function abnormalities due to global hypokinesis.   Coronary Diagrams   Diagnostic Dominance: Right          Recent  Radiology Findings:   DG Chest 2 View  Result Date: 04/30/2020 CLINICAL DATA:  Chest pain EXAM: CHEST - 2 VIEW COMPARISON:  None. FINDINGS: There is a 6 x 6.6 x 6.2 cm mass in the posterior left mid lung. There is no pleural effusion. The right lung is clear. The mediastinal contour and cardiac silhouette are normal. Degenerative joint changes of the spine are identified. IMPRESSION: Mass in the posterior left mid lung. Further evaluation with chest CT is recommended. Electronically Signed   By: Abelardo Diesel M.D.   On: 04/30/2020 11:58   CLINICAL DATA:  Substernal chest pain.  Nausea.  Diaphoresis.  EXAM: CT ANGIOGRAPHY CHEST WITH CONTRAST  TECHNIQUE: Multidetector CT imaging of the chest was performed using the standard protocol during bolus administration of intravenous contrast. Multiplanar CT image reconstructions and MIPs were obtained to evaluate the vascular anatomy.  CONTRAST:  64mL OMNIPAQUE IOHEXOL 350 MG/ML SOLN  COMPARISON:  Chest radiograph 04/30/2020  FINDINGS: Cardiovascular: No filling defect is identified in the pulmonary arterial tree to suggest pulmonary embolus. Coronary, aortic arch, and branch vessel atherosclerotic vascular disease.  Mediastinum/Nodes: Calcified subcarinal lymph nodes are present. Indistinct right lower paratracheal node 0.8 cm in short axis on image 65 series 5. Left infrahilar node 1.0 cm in short axis image 100 series 5. Mild cardiomegaly.  Lungs/Pleura: Small left and trace right pleural effusions.  A rounded masslike lesion posteriorly in the left lower lobe measures 6.6 by 4.1 by 5.5 cm. Some of this represents atelectatic lung or consolidation based on pulmonary vessels tracking within the rounded region. However, there also appears to be a mildly hypodense approximately 3.3 by 2.5 by 2.5 cm portion of this lesion on image 34 series 4 that does not have internal vessels and which could represent an underlying malignancy,  abscess, or region of cavitation.  Airway thickening is present, suggesting bronchitis or reactive airways disease. Interstitial accentuation suggesting mild interstitial edema at the lung apices, with some patchy heterogeneity in the lungs which could be from centrilobular emphysema or air trapping.  Upper Abdomen: Nodular contour of the lateral segment left hepatic lobe, query cirrhosis. Prominent common hepatic duct. Cholecystectomy. Abdominal aortic atherosclerosis.  Musculoskeletal: Thoracic spondylosis.  Review  of the MIP images confirms the above findings.  IMPRESSION: 1. No filling defect is identified in the pulmonary arterial tree to suggest pulmonary embolus. 2. Rounded consolidation in the left lower lobe surrounding an apparent 3.3 by 2.5 by 2.5 cm hypoenhancing lesion which could represent malignancy, abscess, or cavitation. Mildly enlarged left infrahilar lymph node. 3. Small left and trace right pleural effusions. 4. Mild cardiomegaly with suspected interstitial edema. 5. Airway thickening is present, suggesting bronchitis or reactive airways disease. 6. Nodular contour of the left hepatic lobe suggesting cirrhosis. Chronic prominent biliary tree, possibly a physiologic response to cholecystectomy. 7.  Aortic Atherosclerosis (ICD10-I70.0).   Electronically Signed   By: Van Clines M.D.   On: 04/30/2020 17:28      I have independently reviewed the above radiologic studies and discussed with the patient   Recent Lab Findings: Lab Results  Component Value Date   WBC 5.5 05/01/2020   HGB 12.4 05/01/2020   HCT 35.3 (L) 05/01/2020   PLT 180 05/01/2020   GLUCOSE 117 (H) 05/01/2020   CHOL 99 05/01/2020   TRIG 190 (H) 05/01/2020   HDL 29 (L) 05/01/2020   LDLCALC 32 05/01/2020   ALT 23 05/01/2020   AST 84 (H) 05/01/2020   NA 137 05/01/2020   K 3.6 05/01/2020   CL 106 05/01/2020   CREATININE 0.61 05/01/2020   BUN 7 (L) 05/01/2020   CO2  25 05/01/2020   TSH 2.469 05/01/2020   INR 1.1 04/30/2020      Assessment / Plan:      -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  Coronary bypass grafting is her best option for revascularization.  The procedure and expected perioperative course were discussed with her and her daughter in detail and their questions were answered.  She would like for [place] to proceed with preoperative evaluation in preparation for surgery. Clinical data will be reviewed by Dr. Roxan Hockey and recommendations regarding timing of surgery will follow.  -History of ductal carcinoma in situ, status post left breast lumpectomy.  The admission the CTA demonstrated a 3.3 x 2.5 x 2.5 rounded left lower lobe mass that could represent a malignancy, abscess, or cavitation.  She has had some cough lately but no hemoptysis.  This lesion will need further work-up.  -History of peripheral vascular disease, status post left iliac artery stenting in 2000.  Recent ABIs obtained within the past 6 months were normal.  -History of hypertension  -History of dyslipidemia  -Former tobacco smoker, quit 20 years ago.    I  spent 30 minutes counseling the patient face to face.   Antony Odea, PA-C  05/01/2020 5:33 PM  Jaya Joo is a 80 year old woman with multiple cardiac risk factors including hypertension, hyperlipidemia, remote tobacco abuse, and PAD.  She quit smoking about 20 years ago.  She has no prior history of coronary disease.  She presented with unstable chest pain.  She ruled in for non-ST elevation MI.  Catheterization showed severe left main and three-vessel coronary disease.  She has a small codominant RCA.  The left-sided vessels are large and appear to be good quality targets.    Coronary bypass grafting is indicated  for survival benefit and relief of symptoms.  I discussed the general nature of the procedure with the patient and her daughter.  They understand the need for general  anesthesia, the incisions to be used, the use of cardiopulmonary bypass, the expected hospital stay, and the overall recovery.  I informed her of the indications, risks, benefits, and alternatives.  She understands the risks include, but are not limited to death, MI, DVT, PE, bleeding, possible need for transfusion, infection, cardiac arrhythmias, respiratory or renal failure, as well as the possibility of other unstable complications.  She accepts the risks and agrees to proceed.  She was found to have a rather large and unusual appearing left lower lobe lung mass.  This mass will not be accessible at the time of surgery, and will need to be worked up further postoperatively.  She has severe atherosclerotic cardiovascular disease involving the thoracic aorta, especially in the arch and descending.  She is at higher risk for stroke due to her atherosclerotic disease in the arch.  Revonda Standard Roxan Hockey, MD [facility] [phone]

## 2020-05-01 NOTE — Progress Notes (Signed)
ANTICOAGULATION CONSULT NOTE - Follow Up Consult  Pharmacy Consult for Heparin Indication: mvCAD awaiting CABG  Allergies  Allergen Reactions  . Ibuprofen Hives  . Penicillins Hives  . Aleve [Naproxen Sodium] Hives  . Avapro  [Irbesartan] Rash    Other reaction(s): Weal  . Azithromycin Hives  . Nickel Itching    Redness, itching and fluid discharge with nickel earrings  . Other Other (See Comments)    Nickel"ears ran fluid and itching" redness and "sensitive" Nickel"ears ran fluid and itching"    Patient Measurements: Height: 5' (152.4 cm) Weight: 59.4 kg (130 lb 15.3 oz) IBW/kg (Calculated) : 45.5 Heparin Dosing Weight: 57 kg  Vital Signs: Temp: 98.6 F (37 C) (04/18 1620) Temp Source: Oral (04/18 1620) BP: 142/66 (04/18 1620) Pulse Rate: 89 (04/18 1620)  Labs: Recent Labs    04/30/20 1142 04/30/20 1238 04/30/20 1616 04/30/20 2218 05/01/20 0555  HGB  --  13.0  --   --  12.4  HCT  --  37.1  --   --  35.3*  PLT  --  213  --   --  180  APTT 27  --   --   --   --   LABPROT 13.7  --   --   --   --   INR 1.1  --   --   --   --   HEPARINUNFRC  --   --   --  <0.10*  --   CREATININE  --  0.57  --   --  0.61  TROPONINIHS  --  19,201* 57,972*  --   --     Estimated Creatinine Clearance: 47.5 mL/min (by C-G formula based on SCr of 0.61 mg/dL).   Medications:  Infusions:   Assessment: 79 YOF transferred to [facility] for CVTS eval after cardiac cath at [facility] revealed mvCAD.   At [facility] the patient had a HL of <0.1 on a rate of 700 units/hr and the rate was increased to 950 units/hr however no new level was drawn on this rate prior to cath. Sheath was removed around 1000 and Heparin was intended to restart around 1600 but never did prior to transfer to [facility]. Will plan to restart Heparin at the prior recommended rate and check a HL in 8 hours for monitoring.   Goal of Therapy:  Heparin level 0.3-0.7 units/ml Monitor platelets by anticoagulation protocol: Yes   Plan:  -  Resume Heparin at 950 units/hr - Daily HL, CBC - Will continue to monitor for any signs/symptoms of bleeding and will follow up with heparin level in 8 hours   Thank you for allowing pharmacy to be a part of this patient's care.  Alycia Rossetti, PharmD, BCPS Clinical Pharmacist Clinical phone for 05/01/2020: [phone] 05/01/2020 7:47 PM   **Pharmacist phone directory can now be found on [email or web address] (PW [email or web address]).  Listed under [place].

## 2020-05-02 ENCOUNTER — Inpatient Hospital Stay (HOSPITAL_COMMUNITY): Payer: Medicare Other

## 2020-05-02 DIAGNOSIS — I10 Essential (primary) hypertension: Secondary | ICD-10-CM | POA: Diagnosis not present

## 2020-05-02 DIAGNOSIS — I214 Non-ST elevation (NSTEMI) myocardial infarction: Principal | ICD-10-CM

## 2020-05-02 DIAGNOSIS — I340 Nonrheumatic mitral (valve) insufficiency: Secondary | ICD-10-CM

## 2020-05-02 DIAGNOSIS — Z01810 Encounter for preprocedural cardiovascular examination: Secondary | ICD-10-CM | POA: Diagnosis not present

## 2020-05-02 DIAGNOSIS — E782 Mixed hyperlipidemia: Secondary | ICD-10-CM

## 2020-05-02 DIAGNOSIS — I739 Peripheral vascular disease, unspecified: Secondary | ICD-10-CM

## 2020-05-02 DIAGNOSIS — R079 Chest pain, unspecified: Secondary | ICD-10-CM

## 2020-05-02 LAB — URINALYSIS, ROUTINE W REFLEX MICROSCOPIC
Bacteria, UA: NONE SEEN
Bilirubin Urine: NEGATIVE
Glucose, UA: NEGATIVE mg/dL
Ketones, ur: 20 mg/dL — AB
Leukocytes,Ua: NEGATIVE
Nitrite: NEGATIVE
Protein, ur: NEGATIVE mg/dL
Specific Gravity, Urine: 1.015 (ref 1.005–1.030)
pH: 5 (ref 5.0–8.0)

## 2020-05-02 LAB — ECHOCARDIOGRAM COMPLETE
AV Mean grad: 3 mmHg
AV Peak grad: 5.3 mmHg
Ao pk vel: 1.15 m/s
Area-P 1/2: 14.59 cm2
Calc EF: 33.8 %
Height: 60 in
MV M vel: 6 m/s
MV Peak grad: 144 mmHg
Radius: 0.2 cm
Single Plane A2C EF: 21.1 %
Single Plane A4C EF: 47.9 %
Weight: 2077.62 oz

## 2020-05-02 LAB — CBC
HCT: 36.2 % (ref 36.0–46.0)
Hemoglobin: 12.1 g/dL (ref 12.0–15.0)
MCH: 32.3 pg (ref 26.0–34.0)
MCHC: 33.4 g/dL (ref 30.0–36.0)
MCV: 96.5 fL (ref 80.0–100.0)
Platelets: 187 10*3/uL (ref 150–400)
RBC: 3.75 MIL/uL — ABNORMAL LOW (ref 3.87–5.11)
RDW: 13.3 % (ref 11.5–15.5)
WBC: 5.9 10*3/uL (ref 4.0–10.5)
nRBC: 0 % (ref 0.0–0.2)

## 2020-05-02 LAB — BASIC METABOLIC PANEL
Anion gap: 7 (ref 5–15)
BUN: 8 mg/dL (ref 8–23)
CO2: 25 mmol/L (ref 22–32)
Calcium: 8.8 mg/dL — ABNORMAL LOW (ref 8.9–10.3)
Chloride: 105 mmol/L (ref 98–111)
Creatinine, Ser: 0.6 mg/dL (ref 0.44–1.00)
GFR, Estimated: >60 mL/min (ref >60)
Glucose, Bld: 95 mg/dL (ref 70–99)
Potassium: 3.2 mmol/L — ABNORMAL LOW (ref 3.5–5.1)
Sodium: 137 mmol/L (ref 135–145)

## 2020-05-02 LAB — BLOOD GAS, ARTERIAL
Acid-base deficit: 1.3 mmol/L (ref 0.0–2.0)
Bicarbonate: 22.2 mmol/L (ref 20.0–28.0)
Drawn by: 39898
FIO2: 21
O2 Saturation: 93.2 %
Patient temperature: 37.5
pCO2 arterial: 33.4 mmHg (ref 32.0–48.0)
pH, Arterial: 7.44 (ref 7.350–7.450)
pO2, Arterial: 66.3 mmHg — ABNORMAL LOW (ref 83.0–108.0)

## 2020-05-02 LAB — HEPARIN LEVEL (UNFRACTIONATED)
Heparin Unfractionated: 0.14 IU/mL — ABNORMAL LOW (ref 0.30–0.70)
Heparin Unfractionated: 0.23 IU/mL — ABNORMAL LOW (ref 0.30–0.70)

## 2020-05-02 LAB — SURGICAL PCR SCREEN
MRSA, PCR: NEGATIVE
Staphylococcus aureus: NEGATIVE

## 2020-05-02 MED ORDER — TRANEXAMIC ACID (OHS) BOLUS VIA INFUSION
15.0000 mg/kg | INTRAVENOUS | Status: AC
  Administered 2020-05-03: 883.5 mg via INTRAVENOUS
  Filled 2020-05-02: qty 884

## 2020-05-02 MED ORDER — INSULIN REGULAR(HUMAN) IN NACL 100-0.9 UT/100ML-% IV SOLN
INTRAVENOUS | Status: AC
  Administered 2020-05-03: 1 [IU]/h via INTRAVENOUS
  Filled 2020-05-02: qty 100

## 2020-05-02 MED ORDER — CHLORHEXIDINE GLUCONATE 0.12 % MT SOLN
15.0000 mL | Freq: Once | OROMUCOSAL | Status: AC
Start: 2020-05-03 — End: 2020-05-03
  Administered 2020-05-03: 15 mL via OROMUCOSAL
  Filled 2020-05-02: qty 15

## 2020-05-02 MED ORDER — PLASMA-LYTE 148 IV SOLN
INTRAVENOUS | Status: DC
  Filled 2020-05-02: qty 2.5

## 2020-05-02 MED ORDER — PHENYLEPHRINE HCL-NACL 20-0.9 MG/250ML-% IV SOLN
30.0000 ug/min | INTRAVENOUS | Status: AC
  Administered 2020-05-03: 30 ug/min via INTRAVENOUS
  Filled 2020-05-02: qty 250

## 2020-05-02 MED ORDER — CHLORHEXIDINE GLUCONATE CLOTH 2 % EX PADS
6.0000 | MEDICATED_PAD | Freq: Once | CUTANEOUS | Status: AC
  Administered 2020-05-03: 6 via TOPICAL

## 2020-05-02 MED ORDER — METOPROLOL TARTRATE 12.5 MG HALF TABLET
12.5000 mg | ORAL_TABLET | Freq: Once | ORAL | Status: AC
  Administered 2020-05-03: 12.5 mg via ORAL
  Filled 2020-05-02: qty 1

## 2020-05-02 MED ORDER — TRANEXAMIC ACID 1000 MG/10ML IV SOLN
1.5000 mg/kg/h | INTRAVENOUS | Status: AC
  Administered 2020-05-03: 1.5 mg/kg/h via INTRAVENOUS
  Filled 2020-05-02: qty 25

## 2020-05-02 MED ORDER — MILRINONE LACTATE IN DEXTROSE 20-5 MG/100ML-% IV SOLN
0.3000 ug/kg/min | INTRAVENOUS | Status: DC
  Filled 2020-05-02: qty 100

## 2020-05-02 MED ORDER — SODIUM CHLORIDE 0.9 % IV SOLN
INTRAVENOUS | Status: DC
  Filled 2020-05-02: qty 30

## 2020-05-02 MED ORDER — TRANEXAMIC ACID (OHS) PUMP PRIME SOLUTION
2.0000 mg/kg | INTRAVENOUS | Status: DC
  Filled 2020-05-02: qty 1.18

## 2020-05-02 MED ORDER — POTASSIUM CHLORIDE 2 MEQ/ML IV SOLN
80.0000 meq | INTRAVENOUS | Status: DC
  Filled 2020-05-02: qty 40

## 2020-05-02 MED ORDER — CHLORHEXIDINE GLUCONATE CLOTH 2 % EX PADS
6.0000 | MEDICATED_PAD | Freq: Once | CUTANEOUS | Status: AC
  Administered 2020-05-02: 6 via TOPICAL

## 2020-05-02 MED ORDER — NITROGLYCERIN IN D5W 200-5 MCG/ML-% IV SOLN
2.0000 ug/min | INTRAVENOUS | Status: DC
  Filled 2020-05-02: qty 250

## 2020-05-02 MED ORDER — BISACODYL 5 MG PO TBEC: 5.0000 mg | DELAYED_RELEASE_TABLET | Freq: Once | ORAL | Status: DC

## 2020-05-02 MED ORDER — DIAZEPAM 2 MG PO TABS
2.0000 mg | ORAL_TABLET | Freq: Once | ORAL | Status: AC
  Administered 2020-05-03: 2 mg via ORAL
  Filled 2020-05-02: qty 1

## 2020-05-02 MED ORDER — POTASSIUM CHLORIDE CRYS ER 20 MEQ PO TBCR
60.0000 meq | EXTENDED_RELEASE_TABLET | Freq: Once | ORAL | Status: AC
  Administered 2020-05-02: 60 meq via ORAL
  Filled 2020-05-02: qty 3

## 2020-05-02 MED ORDER — DEXMEDETOMIDINE HCL IN NACL 400 MCG/100ML IV SOLN
0.1000 ug/kg/h | INTRAVENOUS | Status: AC
  Administered 2020-05-03: .4 ug/kg/h via INTRAVENOUS
  Filled 2020-05-02: qty 100

## 2020-05-02 MED ORDER — SODIUM CHLORIDE 0.9 % IV SOLN
1.5000 g | INTRAVENOUS | Status: AC
  Administered 2020-05-03: 1.5 g via INTRAVENOUS
  Filled 2020-05-02 (×2): qty 1.5

## 2020-05-02 MED ORDER — MAGNESIUM SULFATE 50 % IJ SOLN
40.0000 meq | INTRAMUSCULAR | Status: DC
  Filled 2020-05-02: qty 9.85

## 2020-05-02 MED ORDER — EPINEPHRINE HCL 5 MG/250ML IV SOLN IN NS
0.0000 ug/min | INTRAVENOUS | Status: DC
Start: 2020-05-03 — End: 2020-05-03
  Filled 2020-05-02: qty 250

## 2020-05-02 MED ORDER — NOREPINEPHRINE 4 MG/250ML-% IV SOLN
0.0000 ug/min | INTRAVENOUS | Status: AC
  Administered 2020-05-03: 2 ug/min via INTRAVENOUS
  Filled 2020-05-02: qty 250

## 2020-05-02 MED ORDER — SODIUM CHLORIDE 0.9 % IV SOLN
750.0000 mg | INTRAVENOUS | Status: AC
  Administered 2020-05-03: 750 mg via INTRAVENOUS
  Filled 2020-05-02: qty 750

## 2020-05-02 MED ORDER — VANCOMYCIN HCL 1250 MG/250ML IV SOLN
1250.0000 mg | INTRAVENOUS | Status: AC
  Administered 2020-05-03: 1250 mg via INTRAVENOUS
  Filled 2020-05-02: qty 250

## 2020-05-02 NOTE — Progress Notes (Signed)
ANTICOAGULATION CONSULT NOTE - Follow Up Consult  Pharmacy Consult for Heparin Indication: mvCAD awaiting CABG  Allergies  Allergen Reactions  . Ibuprofen Hives  . Penicillins Hives  . Aleve [Naproxen Sodium] Hives  . Avapro  [Irbesartan] Rash    Other reaction(s): Weal  . Azithromycin Hives  . Nickel Itching    Redness, itching and fluid discharge with nickel earrings  . Other Other (See Comments)    Nickel"ears ran fluid and itching" redness and "sensitive" Nickel"ears ran fluid and itching"    Patient Measurements: Height: 5' (152.4 cm) Weight: 58.9 kg (129 lb 13.6 oz) (scale B) IBW/kg (Calculated) : 45.5 Heparin Dosing Weight: 57 kg  Vital Signs: Temp: 98.1 F (36.7 C) (04/19 0717) Temp Source: Oral (04/19 0717) BP: 131/65 (04/19 0717) Pulse Rate: 89 (04/19 0717)  Labs: Recent Labs    04/30/20 1142 04/30/20 1238 04/30/20 1238 04/30/20 1616 04/30/20 2218 05/01/20 0555 05/02/20 0634  HGB  --  13.0   < >  --   --  12.4 12.1  HCT  --  37.1  --   --   --  35.3* 36.2  PLT  --  213  --   --   --  180 187  APTT 27  --   --   --   --   --   --   LABPROT 13.7  --   --   --   --   --   --   INR 1.1  --   --   --   --   --   --   HEPARINUNFRC  --   --   --   --  <0.10*  --  0.14*  CREATININE  --  0.57  --   --   --  0.61 0.60  TROPONINIHS  --  19,201*  --  16,010*  --   --   --    < > = values in this interval not displayed.    Estimated Creatinine Clearance: 47.3 mL/min (by C-G formula based on SCr of 0.6 mg/dL).   Medications:  Infusions:  . heparin 950 Units/hr (05/01/20 2025)   Assessment: 12 YOF transferred to [facility] for CVTS eval after cardiac cath at [facility] revealed mvCAD. Plans noted for CABG -heparin level = 0.14, hg= 12.1   Goal of Therapy:  Heparin level 0.3-0.7 units/ml Monitor platelets by anticoagulation protocol: Yes   Plan:  - Increase Heparin to 1150 units/hr -Heparin level in 8 hours and daily wth CBC daily  Hildred Laser,  PharmD Clinical Pharmacist **Pharmacist phone directory can now be found on [email or web address] ([email or web address]).  Listed under [place].

## 2020-05-02 NOTE — Progress Notes (Signed)
CARDIAC REHAB PHASE I   Preop education completed with pt and son. Pt given IS and able to demonstrate ~1500. Reviewed importance of IS use, walks, and sternal precautions. Pt given in-the-tube sheet along with [facility] care guide and Cardiac Surgery booklet. Pt states she has a Jester and shower chair at home. Will continue to follow throughout hospital stay.  [phone] Rufina Falco, RN BSN 05/02/2020 10:43 AM

## 2020-05-02 NOTE — Progress Notes (Signed)
  Subjective: No complaints this AM, denies CP  Objective: Vital signs in last 24 hours: Temp:  [97 F (36.1 C)-98.6 F (37 C)] 98.1 F (36.7 C) (04/19 0717) Pulse Rate:  [82-97] 89 (04/19 0717) Cardiac Rhythm: Normal sinus rhythm (04/19 0405) Resp:  [15-27] 16 (04/19 0717) BP: (131-150)/(57-73) 131/65 (04/19 0717) SpO2:  [92 %-100 %] 96 % (04/19 0717) Weight:  [58.9 kg-59.4 kg] 58.9 kg (04/19 0431)  Hemodynamic parameters for last 24 hours:    Intake/Output from previous day: 04/18 0701 - 04/19 0700 In: -  Out: 300 [Urine:300] Intake/Output this shift: No intake/output data recorded.  General appearance: alert, cooperative and no distress Heart: regular rate and rhythm Lungs: clear to auscultation bilaterally  Lab Results: Recent Labs    05/01/20 0555 05/02/20 0634  WBC 5.5 5.9  HGB 12.4 12.1  HCT 35.3* 36.2  PLT 180 187   BMET:  Recent Labs    05/01/20 0555 05/02/20 0634  NA 137 137  K 3.6 3.2*  CL 106 105  CO2 25 25  GLUCOSE 117* 95  BUN 7* 8  CREATININE 0.61 0.60  CALCIUM 8.9 8.8*    PT/INR:  Recent Labs    04/30/20 1142  LABPROT 13.7  INR 1.1   ABG No results found for: PHART, HCO3, TCO2, ACIDBASEDEF, O2SAT CBG (last 3)  No results for input(s): GLUCAP in the last 72 hours.  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 Severe calcific atherosclerotic disease of the arch and descending aorta. Increased risk of perioperative stroke. Unusual appearing posterior left lower lobe lung mass- not a typical appearance for a lung cancer. Will need further w/u as outpatient. She understands it will not be accessible at time of surgery.   LOS: 1 day    [name] 05/02/2020

## 2020-05-02 NOTE — Anesthesia Preprocedure Evaluation (Addendum)
Anesthesia Evaluation  Patient identified by MRN, date of birth, ID band Patient awake    Reviewed: Allergy & Precautions, NPO status , Patient's Chart, lab work & pertinent test results, reviewed documented beta blocker date and time   Airway Mallampati: II  TM Distance: >3 FB Neck ROM: Full    Dental  (+) Dental Advisory Given, Edentulous Lower, Edentulous Upper   Pulmonary former smoker,    Pulmonary exam normal breath sounds clear to auscultation       Cardiovascular hypertension, Pt. on home beta blockers and Pt. on medications + CAD, + Past MI and + Peripheral Vascular Disease  Normal cardiovascular exam Rhythm:Regular Rate:Normal  Echo 05/02/20: 1. Left ventricular ejection fraction, by estimation, is 40 to 45%. The  left ventricle has mildly decreased function. The left ventricle  demonstrates global hypokinesis. There is mild concentric left ventricular  hypertrophy. Indeterminate diastolic  filling due to E-A fusion. Elevated left ventricular end-diastolic  pressure.  2. Right ventricular systolic function is normal. The right ventricular  size is normal. There is mildly elevated pulmonary artery systolic  pressure. The estimated right ventricular systolic pressure is 29.5 mmHg.  3. Left atrial size was moderately dilated.  4. The mitral valve is normal in structure. Moderate mitral valve  regurgitation.  5. The aortic valve is grossly normal. Aortic valve regurgitation is not  visualized.  6. The inferior vena cava is normal in size with greater than 50%  respiratory variability, suggesting right atrial pressure of 3 mmHg.    Neuro/Psych negative neurological ROS  negative psych ROS   GI/Hepatic negative GI ROS, Neg liver ROS,   Endo/Other  negative endocrine ROS  Renal/GU negative Renal ROS     Musculoskeletal  (+) Arthritis ,   Abdominal   Peds  Hematology negative hematology ROS (+)    Anesthesia Other Findings   Reproductive/Obstetrics                            Anesthesia Physical Anesthesia Plan  ASA: IV  Anesthesia Plan: General   Post-op Pain Management:    Induction: Intravenous  PONV Risk Score and Plan: 3 and Treatment may vary due to age or medical condition and Midazolam  Airway Management Planned: Oral ETT  Additional Equipment: Arterial line, CVP, PA Cath, TEE and Ultrasound Guidance Line Placement  Intra-op Plan:   Post-operative Plan: Post-operative intubation/ventilation  Informed Consent: I have reviewed the patients History and Physical, chart, labs and discussed the procedure including the risks, benefits and alternatives for the proposed anesthesia with the patient or authorized representative who has indicated his/her understanding and acceptance.     Dental advisory given  Plan Discussed with: CRNA  Anesthesia Plan Comments:        Anesthesia Quick Evaluation

## 2020-05-02 NOTE — Progress Notes (Signed)
Heart Failure Navigator Progress Note  Assessed for Heart & Vascular TOC clinic readiness.  Unfortunately at this time the patient does not meet criteria due to planned CABG 4/20.   Navigator available for reassessment of patient.   Pricilla Holm, RN, BSN Heart Failure Nurse Navigator [phone]

## 2020-05-02 NOTE — Progress Notes (Signed)
Pre cabg has been completed.   Preliminary results in CV Proc.   Abram Sander 05/02/2020 1:54 PM

## 2020-05-02 NOTE — Progress Notes (Signed)
Progress Note  Patient Name: [name] Date of Encounter: 05/02/2020  [facility] Cardiologist: Dr Fletcher Anon  Subjective   Feels well this am. No chest pain or dyspnea. Anxious about tomorrow.   Inpatient Medications    Scheduled Meds: . aspirin EC  81 mg Oral Daily  . atorvastatin  40 mg Oral Daily  . carvedilol  3.125 mg Oral BID WC  . potassium chloride  60 mEq Oral Once   Continuous Infusions: . heparin 1,150 Units/hr (05/02/20 0840)   PRN Meds: acetaminophen, nitroGLYCERIN, ondansetron (ZOFRAN) IV   Vital Signs    Vitals:   05/01/20 1957 05/01/20 2332 05/02/20 0431 05/02/20 0717  BP: (!) 149/64 (!) 143/63 (!) 145/67 131/65  Pulse: 88 90 89 89  Resp: 18 20 18 16   Temp: 98 F (36.7 C) 97.9 F (36.6 C) 98.2 F (36.8 C) 98.1 F (36.7 C)  TempSrc: Oral Oral Oral Oral  SpO2: 98% 94% 96% 96%  Weight:   58.9 kg   Height:        Intake/Output Summary (Last 24 hours) at 05/02/2020 0947 Last data filed at 05/01/2020 2000 Gross per 24 hour  Intake --  Output 300 ml  Net -300 ml   Last 3 Weights 05/02/2020 05/01/2020 04/30/2020  Weight (lbs) 129 lb 13.6 oz 130 lb 15.3 oz 131 lb  Weight (kg) 58.9 kg 59.4 kg 59.421 kg      Telemetry    NSR - Personally Reviewed  ECG    NSR LBBB - Personally Reviewed  Physical Exam   GEN: No acute distress.   Neck: No JVD Cardiac: RRR, no murmurs, rubs, or gallops.  Respiratory: Clear to auscultation bilaterally. GI: Soft, nontender, non-distended  MS: No edema; No deformity. Neuro:  Nonfocal  Psych: Normal affect   Labs    High Sensitivity Troponin:   Recent Labs  Lab 04/30/20 1238 04/30/20 1616  TROPONINIHS 19,201* 14,653*      Chemistry Recent Labs  Lab 04/30/20 1238 05/01/20 0555 05/02/20 0634  NA 139 137 137  K 3.7 3.6 3.2*  CL 104 106 105  CO2 25 25 25   GLUCOSE 107* 117* 95  BUN 8 7* 8  CREATININE 0.57 0.61 0.60  CALCIUM 9.2 8.9 8.8*  PROT  --  6.8  --   ALBUMIN  --  3.1*  --   AST  --   84*  --   ALT  --  23  --   ALKPHOS  --  61  --   BILITOT  --  1.6*  --   GFRNONAA >60 >60 >60  ANIONGAP 10 6 7      Hematology Recent Labs  Lab 04/30/20 1238 05/01/20 0555 05/02/20 0634  WBC 7.9 5.5 5.9  RBC 3.98 3.74* 3.75*  HGB 13.0 12.4 12.1  HCT 37.1 35.3* 36.2  MCV 93.2 94.4 96.5  MCH 32.7 33.2 32.3  MCHC 35.0 35.1 33.4  RDW 13.3 13.6 13.3  PLT 213 180 187    BNP Recent Labs  Lab 04/30/20 1238  BNP 646.9*     DDimer No results for input(s): DDIMER in the last 168 hours.   Radiology    DG Chest 2 View  Result Date: 04/30/2020 CLINICAL DATA:  Chest pain EXAM: CHEST - 2 VIEW COMPARISON:  None. FINDINGS: There is a 6 x 6.6 x 6.2 cm mass in the posterior left mid lung. There is no pleural effusion. The right lung is clear. The mediastinal contour and cardiac silhouette  are normal. Degenerative joint changes of the spine are identified. IMPRESSION: Mass in the posterior left mid lung. Further evaluation with chest CT is recommended. Electronically Signed   By: Abelardo Diesel M.D.   On: 04/30/2020 11:58   CT Angio Chest PE W and/or Wo Contrast  Result Date: 04/30/2020 CLINICAL DATA:  Substernal chest pain.  Nausea.  Diaphoresis. EXAM: CT ANGIOGRAPHY CHEST WITH CONTRAST TECHNIQUE: Multidetector CT imaging of the chest was performed using the standard protocol during bolus administration of intravenous contrast. Multiplanar CT image reconstructions and MIPs were obtained to evaluate the vascular anatomy. CONTRAST:  52mL OMNIPAQUE IOHEXOL 350 MG/ML SOLN COMPARISON:  Chest radiograph 04/30/2020 FINDINGS: Cardiovascular: No filling defect is identified in the pulmonary arterial tree to suggest pulmonary embolus. Coronary, aortic arch, and branch vessel atherosclerotic vascular disease. Mediastinum/Nodes: Calcified subcarinal lymph nodes are present. Indistinct right lower paratracheal node 0.8 cm in short axis on image 65 series 5. Left infrahilar node 1.0 cm in short axis image 100  series 5. Mild cardiomegaly. Lungs/Pleura: Small left and trace right pleural effusions. A rounded masslike lesion posteriorly in the left lower lobe measures 6.6 by 4.1 by 5.5 cm. Some of this represents atelectatic lung or consolidation based on pulmonary vessels tracking within the rounded region. However, there also appears to be a mildly hypodense approximately 3.3 by 2.5 by 2.5 cm portion of this lesion on image 34 series 4 that does not have internal vessels and which could represent an underlying malignancy, abscess, or region of cavitation. Airway thickening is present, suggesting bronchitis or reactive airways disease. Interstitial accentuation suggesting mild interstitial edema at the lung apices, with some patchy heterogeneity in the lungs which could be from centrilobular emphysema or air trapping. Upper Abdomen: Nodular contour of the lateral segment left hepatic lobe, query cirrhosis. Prominent common hepatic duct. Cholecystectomy. Abdominal aortic atherosclerosis. Musculoskeletal: Thoracic spondylosis. Review of the MIP images confirms the above findings. IMPRESSION: 1. No filling defect is identified in the pulmonary arterial tree to suggest pulmonary embolus. 2. Rounded consolidation in the left lower lobe surrounding an apparent 3.3 by 2.5 by 2.5 cm hypoenhancing lesion which could represent malignancy, abscess, or cavitation. Mildly enlarged left infrahilar lymph node. 3. Small left and trace right pleural effusions. 4. Mild cardiomegaly with suspected interstitial edema. 5. Airway thickening is present, suggesting bronchitis or reactive airways disease. 6. Nodular contour of the left hepatic lobe suggesting cirrhosis. Chronic prominent biliary tree, possibly a physiologic response to cholecystectomy. 7.  Aortic Atherosclerosis (ICD10-I70.0). Electronically Signed   By: Van Clines M.D.   On: 04/30/2020 17:28   CARDIAC CATHETERIZATION  Result Date: 05/01/2020  There is moderate left  ventricular systolic dysfunction.  LV end diastolic pressure is mildly elevated.  The left ventricular ejection fraction is 35-45% by visual estimate.  Ost RCA to Prox RCA lesion is 60% stenosed.  Mid RCA to Dist RCA lesion is 100% stenosed.  Ost LM to Dist LM lesion is 80% stenosed.  Ost Cx to Prox Cx lesion is 80% stenosed.  Ost LAD to Prox LAD lesion is 80% stenosed.  1.  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  Significant pressure dampening with catheter engagement of the left main coronary artery. 2.  Moderately reduced LV systolic function.  Mildly elevated left ventricular end-diastolic pressure. Recommendations: Recommend transfer to [facility] for CABG evaluation. Obtain an echocardiogram to better evaluate LV systolic function and valvular abnormalities. Resume heparin 6 hours after sheath pull.    Cardiac Studies  Cardiac cath 05/01/20:  LEFT HEART CATH AND CORONARY ANGIOGRAPHY    Conclusion    There is moderate left ventricular systolic dysfunction.  LV end diastolic pressure is mildly elevated.  The left ventricular ejection fraction is 35-45% by visual estimate.  Ost RCA to Prox RCA lesion is 60% stenosed.  Mid RCA to Dist RCA lesion is 100% stenosed.  Ost LM to Dist LM lesion is 80% stenosed.  Ost Cx to Prox Cx lesion is 80% stenosed.  Ost LAD to Prox LAD lesion is 80% stenosed.   1.  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  Significant pressure dampening with catheter engagement of the left main coronary artery. 2.  Moderately reduced LV systolic function.  Mildly elevated left ventricular end-diastolic pressure.  Recommendations: Recommend transfer to [facility] for CABG evaluation. Obtain an echocardiogram to better evaluate LV systolic function and valvular abnormalities. Resume heparin 6 hours after sheath pull.  Diagnostic Dominance: Right    Intervention    Patient Profile     80 y.o. female  with PVD s/p intervention of the left common iliac artery in 2000, left-sided breast cancer status post lumpectomy and radiation, HTN, HLD, and prior tobacco use at 1 pack/day quitting over 20 years ago who was admitted to [facility] on 4/17 with an NSTEMI.  Assessment & Plan    1.  NSTEMI: -Currently chest pain-free -LHC on 4/18 demonstrated severe calcification of the left main coronary artery and three-vessel CAD as outlined above.  There was significant pressure dampening with catheter engagement of the left main -Transfered to [facility] for cardiothoracic surgical evaluation for CABG. Plan CABG tomorrow with Dr Roxan Hockey.  - on IV  heparin infusion -Coreg, Lipitor  -As needed morphine and SL NTG  2.  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 EF moderately reduced by cath.  -Will need gentle diuresis -Continue carvedilol -Hold amlodipine with recommendation to escalate GDMT post CABG  3.  HLD: -LDL 32 this admission -Lipitor  4. HTN: -controlled.  5.  PVD: -Status post left lower extremity intervention  -No symptoms of critical limb ischemia -Follow-up with vascular surgery as directed - carotid and LE arterial dopplers today  6. Left lower lung mass noted on CT. Per Dr Roxan Hockey this is atypical for cancer. Will need follow up as outpatient.     For questions or updates, please contact [place] Please consult [email or web address] for contact info under        Signed, Jannis Atkins [place], MD  05/02/2020, 9:47 AM

## 2020-05-02 NOTE — Progress Notes (Signed)
ANTICOAGULATION CONSULT NOTE - Follow Up Consult  Pharmacy Consult for Heparin Indication: mvCAD awaiting CABG  Allergies  Allergen Reactions  . Ibuprofen Hives  . Penicillins Hives    Tolerated Ancef in the past  . Aleve [Naproxen Sodium] Hives  . Avapro  [Irbesartan] Rash    Other reaction(s): Weal  . Azithromycin Hives  . Nickel Itching    Redness, itching and fluid discharge with nickel earrings  . Other Other (See Comments)    Nickel"ears ran fluid and itching" redness and "sensitive" Nickel"ears ran fluid and itching"    Patient Measurements: Height: 5' (152.4 cm) Weight: 58.9 kg (129 lb 13.6 oz) (scale B) IBW/kg (Calculated) : 45.5 Heparin Dosing Weight: 57 kg  Vital Signs: Temp: 99.4 F (37.4 C) (04/19 1708) Temp Source: Oral (04/19 1708) BP: 144/65 (04/19 1708) Pulse Rate: 95 (04/19 1708)  Labs: Recent Labs    04/30/20 1142 04/30/20 1238 04/30/20 1238 04/30/20 1616 04/30/20 2218 05/01/20 0555 05/02/20 0634 05/02/20 1659  HGB  --  13.0   < >  --   --  12.4 12.1  --   HCT  --  37.1  --   --   --  35.3* 36.2  --   PLT  --  213  --   --   --  180 187  --   APTT 27  --   --   --   --   --   --   --   LABPROT 13.7  --   --   --   --   --   --   --   INR 1.1  --   --   --   --   --   --   --   HEPARINUNFRC  --   --   --   --  <0.10*  --  0.14* 0.23*  CREATININE  --  0.57  --   --   --  0.61 0.60  --   TROPONINIHS  --  19,201*  --  54,650*  --   --   --   --    < > = values in this interval not displayed.    Estimated Creatinine Clearance: 47.3 mL/min (by C-G formula based on SCr of 0.6 mg/dL).   Medications:  Infusions:  . [START ON 05/03/2020] cefUROXime (ZINACEF)  IV    . [START ON 05/03/2020] cefUROXime (ZINACEF)  IV    . [START ON 05/03/2020] dexmedetomidine    . [START ON 05/03/2020] heparin 30,000 units/NS 1000 mL solution for CELLSAVER    . heparin 1,150 Units/hr (05/02/20 1502)  . [START ON 05/03/2020] milrinone    . [START ON 05/03/2020]  nitroGLYCERIN    . [START ON 05/03/2020] norepinephrine    . [START ON 05/03/2020] tranexamic acid (CYKLOKAPRON) infusion ([facility])    . [START ON 05/03/2020] vancomycin     Assessment: 79 YOF transferred to [facility] for CVTS eval after cardiac cath at [facility] revealed mvCAD. Plans noted for CABG  PM heparin level 0.23  Goal of Therapy:  Heparin level 0.3-0.7 units/ml Monitor platelets by anticoagulation protocol: Yes   Plan:  -Increase Heparin to 1350 units/hr -Follow up after CABG 4/20  Thank you Anette Guarneri, PharmD **Pharmacist phone directory can now be found on [email or web address] ([email or web address]).  Listed under [place].

## 2020-05-03 ENCOUNTER — Inpatient Hospital Stay (HOSPITAL_COMMUNITY): Payer: Medicare Other | Admitting: Anesthesiology

## 2020-05-03 ENCOUNTER — Inpatient Hospital Stay (HOSPITAL_COMMUNITY): Payer: Medicare Other

## 2020-05-03 ENCOUNTER — Inpatient Hospital Stay (HOSPITAL_COMMUNITY)
Admission: AD | Disposition: A | Discharge status: Home / Self Care | Payer: Self-pay | Source: Other Acute Inpatient Hospital | Attending: Thoracic Surgery (Cardiothoracic Vascular Surgery)

## 2020-05-03 DIAGNOSIS — Z951 Presence of aortocoronary bypass graft: Secondary | ICD-10-CM

## 2020-05-03 HISTORY — PX: CORONARY ARTERY BYPASS GRAFT: SHX141

## 2020-05-03 HISTORY — PX: TEE WITHOUT CARDIOVERSION: SHX5443

## 2020-05-03 LAB — POCT I-STAT, CHEM 8
BUN: 11 mg/dL (ref 8–23)
BUN: 9 mg/dL (ref 8–23)
BUN: 10 mg/dL (ref 8–23)
BUN: 10 mg/dL (ref 8–23)
BUN: 11 mg/dL (ref 8–23)
Calcium, Ion: 1.28 mmol/L (ref 1.15–1.40)
Calcium, Ion: 1.24 mmol/L (ref 1.15–1.40)
Calcium, Ion: 1.11 mmol/L — ABNORMAL LOW (ref 1.15–1.40)
Calcium, Ion: 1.1 mmol/L — ABNORMAL LOW (ref 1.15–1.40)
Calcium, Ion: 1.12 mmol/L — ABNORMAL LOW (ref 1.15–1.40)
Chloride: 103 mmol/L (ref 98–111)
Chloride: 103 mmol/L (ref 98–111)
Chloride: 101 mmol/L (ref 98–111)
Chloride: 102 mmol/L (ref 98–111)
Chloride: 104 mmol/L (ref 98–111)
Creatinine, Ser: 0.4 mg/dL — ABNORMAL LOW (ref 0.44–1.00)
Creatinine, Ser: 0.4 mg/dL — ABNORMAL LOW (ref 0.44–1.00)
Creatinine, Ser: 0.4 mg/dL — ABNORMAL LOW (ref 0.44–1.00)
Creatinine, Ser: 0.4 mg/dL — ABNORMAL LOW (ref 0.44–1.00)
Creatinine, Ser: 0.4 mg/dL — ABNORMAL LOW (ref 0.44–1.00)
Glucose, Bld: 98 mg/dL (ref 70–99)
Glucose, Bld: 102 mg/dL — ABNORMAL HIGH (ref 70–99)
Glucose, Bld: 115 mg/dL — ABNORMAL HIGH (ref 70–99)
Glucose, Bld: 130 mg/dL — ABNORMAL HIGH (ref 70–99)
Glucose, Bld: 121 mg/dL — ABNORMAL HIGH (ref 70–99)
HCT: 32 % — ABNORMAL LOW (ref 36.0–46.0)
HCT: 28 % — ABNORMAL LOW (ref 36.0–46.0)
HCT: 24 % — ABNORMAL LOW (ref 36.0–46.0)
HCT: 25 % — ABNORMAL LOW (ref 36.0–46.0)
HCT: 28 % — ABNORMAL LOW (ref 36.0–46.0)
Hemoglobin: 10.9 g/dL — ABNORMAL LOW (ref 12.0–15.0)
Hemoglobin: 9.5 g/dL — ABNORMAL LOW (ref 12.0–15.0)
Hemoglobin: 8.2 g/dL — ABNORMAL LOW (ref 12.0–15.0)
Hemoglobin: 8.5 g/dL — ABNORMAL LOW (ref 12.0–15.0)
Hemoglobin: 9.5 g/dL — ABNORMAL LOW (ref 12.0–15.0)
Potassium: 3.4 mmol/L — ABNORMAL LOW (ref 3.5–5.1)
Potassium: 3.9 mmol/L (ref 3.5–5.1)
Potassium: 4.2 mmol/L (ref 3.5–5.1)
Potassium: 5.3 mmol/L — ABNORMAL HIGH (ref 3.5–5.1)
Potassium: 4.4 mmol/L (ref 3.5–5.1)
Sodium: 139 mmol/L (ref 135–145)
Sodium: 138 mmol/L (ref 135–145)
Sodium: 138 mmol/L (ref 135–145)
Sodium: 135 mmol/L (ref 135–145)
Sodium: 138 mmol/L (ref 135–145)
TCO2: 24 mmol/L (ref 22–32)
TCO2: 22 mmol/L (ref 22–32)
TCO2: 25 mmol/L (ref 22–32)
TCO2: 24 mmol/L (ref 22–32)
TCO2: 23 mmol/L (ref 22–32)

## 2020-05-03 LAB — POCT I-STAT 7, (LYTES, BLD GAS, ICA,H+H)
Acid-Base Excess: 0 mmol/L (ref 0.0–2.0)
Acid-base deficit: 2 mmol/L (ref 0.0–2.0)
Acid-base deficit: 2 mmol/L (ref 0.0–2.0)
Acid-base deficit: 5 mmol/L — ABNORMAL HIGH (ref 0.0–2.0)
Acid-base deficit: 4 mmol/L — ABNORMAL HIGH (ref 0.0–2.0)
Acid-base deficit: 3 mmol/L — ABNORMAL HIGH (ref 0.0–2.0)
Bicarbonate: 23.1 mmol/L (ref 20.0–28.0)
Bicarbonate: 24.8 mmol/L (ref 20.0–28.0)
Bicarbonate: 22 mmol/L (ref 20.0–28.0)
Bicarbonate: 21.1 mmol/L (ref 20.0–28.0)
Bicarbonate: 21.1 mmol/L (ref 20.0–28.0)
Bicarbonate: 21.7 mmol/L (ref 20.0–28.0)
Calcium, Ion: 1.28 mmol/L (ref 1.15–1.40)
Calcium, Ion: 0.9 mmol/L — ABNORMAL LOW (ref 1.15–1.40)
Calcium, Ion: 1.12 mmol/L — ABNORMAL LOW (ref 1.15–1.40)
Calcium, Ion: 1.2 mmol/L (ref 1.15–1.40)
Calcium, Ion: 1.39 mmol/L (ref 1.15–1.40)
Calcium, Ion: 1.36 mmol/L (ref 1.15–1.40)
HCT: 32 % — ABNORMAL LOW (ref 36.0–46.0)
HCT: 22 % — ABNORMAL LOW (ref 36.0–46.0)
HCT: 24 % — ABNORMAL LOW (ref 36.0–46.0)
HCT: 26 % — ABNORMAL LOW (ref 36.0–46.0)
HCT: 25 % — ABNORMAL LOW (ref 36.0–46.0)
HCT: 26 % — ABNORMAL LOW (ref 36.0–46.0)
Hemoglobin: 10.9 g/dL — ABNORMAL LOW (ref 12.0–15.0)
Hemoglobin: 7.5 g/dL — ABNORMAL LOW (ref 12.0–15.0)
Hemoglobin: 8.2 g/dL — ABNORMAL LOW (ref 12.0–15.0)
Hemoglobin: 8.8 g/dL — ABNORMAL LOW (ref 12.0–15.0)
Hemoglobin: 8.5 g/dL — ABNORMAL LOW (ref 12.0–15.0)
Hemoglobin: 8.8 g/dL — ABNORMAL LOW (ref 12.0–15.0)
O2 Saturation: 100 %
O2 Saturation: 100 %
O2 Saturation: 99 %
O2 Saturation: 97 %
O2 Saturation: 96 %
O2 Saturation: 94 %
Patient temperature: 36.9
Patient temperature: 37.9
Patient temperature: 37.8
Potassium: 3.5 mmol/L (ref 3.5–5.1)
Potassium: 4 mmol/L (ref 3.5–5.1)
Potassium: 4.5 mmol/L (ref 3.5–5.1)
Potassium: 4 mmol/L (ref 3.5–5.1)
Potassium: 4.2 mmol/L (ref 3.5–5.1)
Potassium: 4.3 mmol/L (ref 3.5–5.1)
Sodium: 138 mmol/L (ref 135–145)
Sodium: 141 mmol/L (ref 135–145)
Sodium: 137 mmol/L (ref 135–145)
Sodium: 137 mmol/L (ref 135–145)
Sodium: 139 mmol/L (ref 135–145)
Sodium: 138 mmol/L (ref 135–145)
TCO2: 24 mmol/L (ref 22–32)
TCO2: 26 mmol/L (ref 22–32)
TCO2: 23 mmol/L (ref 22–32)
TCO2: 22 mmol/L (ref 22–32)
TCO2: 22 mmol/L (ref 22–32)
TCO2: 23 mmol/L (ref 22–32)
pCO2 arterial: 38.5 mmHg (ref 32.0–48.0)
pCO2 arterial: 39.5 mmHg (ref 32.0–48.0)
pCO2 arterial: 33.7 mmHg (ref 32.0–48.0)
pCO2 arterial: 43.8 mmHg (ref 32.0–48.0)
pCO2 arterial: 40.4 mmHg (ref 32.0–48.0)
pCO2 arterial: 38.6 mmHg (ref 32.0–48.0)
pH, Arterial: 7.385 (ref 7.350–7.450)
pH, Arterial: 7.406 (ref 7.350–7.450)
pH, Arterial: 7.422 (ref 7.350–7.450)
pH, Arterial: 7.292 — ABNORMAL LOW (ref 7.350–7.450)
pH, Arterial: 7.331 — ABNORMAL LOW (ref 7.350–7.450)
pH, Arterial: 7.361 (ref 7.350–7.450)
pO2, Arterial: 296 mmHg — ABNORMAL HIGH (ref 83.0–108.0)
pO2, Arterial: 313 mmHg — ABNORMAL HIGH (ref 83.0–108.0)
pO2, Arterial: 111 mmHg — ABNORMAL HIGH (ref 83.0–108.0)
pO2, Arterial: 100 mmHg (ref 83.0–108.0)
pO2, Arterial: 94 mmHg (ref 83.0–108.0)
pO2, Arterial: 78 mmHg — ABNORMAL LOW (ref 83.0–108.0)

## 2020-05-03 LAB — HEPARIN LEVEL (UNFRACTIONATED): Heparin Unfractionated: 0.4 IU/mL (ref 0.30–0.70)

## 2020-05-03 LAB — PROTIME-INR
INR: 1.5 — ABNORMAL HIGH (ref 0.8–1.2)
Prothrombin Time: 18.3 seconds — ABNORMAL HIGH (ref 11.4–15.2)

## 2020-05-03 LAB — BASIC METABOLIC PANEL
Anion gap: 8 (ref 5–15)
Anion gap: 8 (ref 5–15)
BUN: 11 mg/dL (ref 8–23)
BUN: 11 mg/dL (ref 8–23)
CO2: 23 mmol/L (ref 22–32)
CO2: 20 mmol/L — ABNORMAL LOW (ref 22–32)
Calcium: 8.9 mg/dL (ref 8.9–10.3)
Calcium: 10.8 mg/dL — ABNORMAL HIGH (ref 8.9–10.3)
Chloride: 105 mmol/L (ref 98–111)
Chloride: 110 mmol/L (ref 98–111)
Creatinine, Ser: 0.67 mg/dL (ref 0.44–1.00)
Creatinine, Ser: 0.66 mg/dL (ref 0.44–1.00)
GFR, Estimated: >60 mL/min (ref >60)
GFR, Estimated: >60 mL/min (ref >60)
Glucose, Bld: 94 mg/dL (ref 70–99)
Glucose, Bld: 105 mg/dL — ABNORMAL HIGH (ref 70–99)
Potassium: 3.6 mmol/L (ref 3.5–5.1)
Potassium: 4.2 mmol/L (ref 3.5–5.1)
Sodium: 136 mmol/L (ref 135–145)
Sodium: 138 mmol/L (ref 135–145)

## 2020-05-03 LAB — CBC
HCT: 34.7 % — ABNORMAL LOW (ref 36.0–46.0)
HCT: 28.2 % — ABNORMAL LOW (ref 36.0–46.0)
HCT: 32.1 % — ABNORMAL LOW (ref 36.0–46.0)
Hemoglobin: 11.8 g/dL — ABNORMAL LOW (ref 12.0–15.0)
Hemoglobin: 9.3 g/dL — ABNORMAL LOW (ref 12.0–15.0)
Hemoglobin: 10.4 g/dL — ABNORMAL LOW (ref 12.0–15.0)
MCH: 32.8 pg (ref 26.0–34.0)
MCH: 33 pg (ref 26.0–34.0)
MCH: 32.8 pg (ref 26.0–34.0)
MCHC: 34 g/dL (ref 30.0–36.0)
MCHC: 33 g/dL (ref 30.0–36.0)
MCHC: 32.4 g/dL (ref 30.0–36.0)
MCV: 96.4 fL (ref 80.0–100.0)
MCV: 100 fL (ref 80.0–100.0)
MCV: 101.3 fL — ABNORMAL HIGH (ref 80.0–100.0)
Platelets: 174 10*3/uL (ref 150–400)
Platelets: 99 10*3/uL — ABNORMAL LOW (ref 150–400)
Platelets: 194 10*3/uL (ref 150–400)
RBC: 3.6 MIL/uL — ABNORMAL LOW (ref 3.87–5.11)
RBC: 2.82 MIL/uL — ABNORMAL LOW (ref 3.87–5.11)
RBC: 3.17 MIL/uL — ABNORMAL LOW (ref 3.87–5.11)
RDW: 13.3 % (ref 11.5–15.5)
RDW: 13.3 % (ref 11.5–15.5)
RDW: 13.6 % (ref 11.5–15.5)
WBC: 6.7 10*3/uL (ref 4.0–10.5)
WBC: 7.3 10*3/uL (ref 4.0–10.5)
WBC: 16.7 10*3/uL — ABNORMAL HIGH (ref 4.0–10.5)
nRBC: 0 % (ref 0.0–0.2)
nRBC: 0 % (ref 0.0–0.2)
nRBC: 0 % (ref 0.0–0.2)

## 2020-05-03 LAB — POCT I-STAT EG7
Acid-base deficit: 2 mmol/L (ref 0.0–2.0)
Bicarbonate: 23.1 mmol/L (ref 20.0–28.0)
Calcium, Ion: 1.07 mmol/L — ABNORMAL LOW (ref 1.15–1.40)
HCT: 23 % — ABNORMAL LOW (ref 36.0–46.0)
Hemoglobin: 7.8 g/dL — ABNORMAL LOW (ref 12.0–15.0)
O2 Saturation: 77 %
Potassium: 3.7 mmol/L (ref 3.5–5.1)
Sodium: 140 mmol/L (ref 135–145)
TCO2: 24 mmol/L (ref 22–32)
pCO2, Ven: 37.6 mmHg — ABNORMAL LOW (ref 44.0–60.0)
pH, Ven: 7.397 (ref 7.250–7.430)
pO2, Ven: 42 mmHg (ref 32.0–45.0)

## 2020-05-03 LAB — GLUCOSE, CAPILLARY
Glucose-Capillary: 134 mg/dL — ABNORMAL HIGH (ref 70–99)
Glucose-Capillary: 130 mg/dL — ABNORMAL HIGH (ref 70–99)
Glucose-Capillary: 131 mg/dL — ABNORMAL HIGH (ref 70–99)
Glucose-Capillary: 113 mg/dL — ABNORMAL HIGH (ref 70–99)
Glucose-Capillary: 90 mg/dL (ref 70–99)
Glucose-Capillary: 154 mg/dL — ABNORMAL HIGH (ref 70–99)
Glucose-Capillary: 138 mg/dL — ABNORMAL HIGH (ref 70–99)

## 2020-05-03 LAB — PREPARE RBC (CROSSMATCH)

## 2020-05-03 LAB — MAGNESIUM: Magnesium: 3.3 mg/dL — ABNORMAL HIGH (ref 1.7–2.4)

## 2020-05-03 LAB — HEMOGLOBIN A1C
Hgb A1c MFr Bld: 5.6 % (ref 4.8–5.6)
Mean Plasma Glucose: 114.02 mg/dL

## 2020-05-03 LAB — HEMOGLOBIN AND HEMATOCRIT, BLOOD
HCT: 24.8 % — ABNORMAL LOW (ref 36.0–46.0)
Hemoglobin: 8.3 g/dL — ABNORMAL LOW (ref 12.0–15.0)

## 2020-05-03 LAB — APTT: aPTT: 29 seconds (ref 24–36)

## 2020-05-03 LAB — PLATELET COUNT: Platelets: 106 10*3/uL — ABNORMAL LOW (ref 150–400)

## 2020-05-03 SURGERY — CORONARY ARTERY BYPASS GRAFTING (CABG)
Anesthesia: General | Site: Chest

## 2020-05-03 MED ORDER — DEXTROSE 50 % IV SOLN: 0.0000 mL | INTRAVENOUS | Status: DC | PRN

## 2020-05-03 MED ORDER — MAGNESIUM SULFATE 4 GM/100ML IV SOLN
4.0000 g | Freq: Once | INTRAVENOUS | Status: AC
  Administered 2020-05-03: 4 g via INTRAVENOUS
  Filled 2020-05-03: qty 100

## 2020-05-03 MED ORDER — METOPROLOL TARTRATE 12.5 MG HALF TABLET
12.5000 mg | ORAL_TABLET | Freq: Two times a day (BID) | ORAL | Status: DC
  Administered 2020-05-04 (×2): 12.5 mg via ORAL
  Filled 2020-05-03 (×2): qty 1

## 2020-05-03 MED ORDER — LACTATED RINGERS IV SOLN: INTRAVENOUS | Status: DC | PRN

## 2020-05-03 MED ORDER — LEVOFLOXACIN IN D5W 750 MG/150ML IV SOLN
750.0000 mg | INTRAVENOUS | Status: AC
  Administered 2020-05-04: 750 mg via INTRAVENOUS
  Filled 2020-05-03: qty 150

## 2020-05-03 MED ORDER — DEXMEDETOMIDINE HCL IN NACL 400 MCG/100ML IV SOLN: 0.0000 ug/kg/h | INTRAVENOUS | Status: DC

## 2020-05-03 MED ORDER — SODIUM CHLORIDE 0.9 % IV SOLN: INTRAVENOUS | Status: DC

## 2020-05-03 MED ORDER — ALBUMIN HUMAN 5 % IV SOLN
12.5000 g | Freq: Once | INTRAVENOUS | Status: AC
  Administered 2020-05-03: 12.5 g via INTRAVENOUS

## 2020-05-03 MED ORDER — SODIUM CHLORIDE 0.45 % IV SOLN: INTRAVENOUS | Status: DC | PRN

## 2020-05-03 MED ORDER — PROPOFOL 10 MG/ML IV BOLUS
INTRAVENOUS | Status: AC
  Filled 2020-05-03: qty 40

## 2020-05-03 MED ORDER — FAMOTIDINE IN NACL 20-0.9 MG/50ML-% IV SOLN
20.0000 mg | Freq: Two times a day (BID) | INTRAVENOUS | Status: AC
  Administered 2020-05-03: 20 mg via INTRAVENOUS
  Filled 2020-05-03: qty 50

## 2020-05-03 MED ORDER — PLASMA-LYTE 148 IV SOLN
INTRAVENOUS | Status: DC | PRN
  Administered 2020-05-03: 500 mL

## 2020-05-03 MED ORDER — SODIUM CHLORIDE 0.9% FLUSH: 3.0000 mL | INTRAVENOUS | Status: DC | PRN

## 2020-05-03 MED ORDER — PROPOFOL 10 MG/ML IV BOLUS
INTRAVENOUS | Status: DC | PRN
  Administered 2020-05-03: 80 mg via INTRAVENOUS

## 2020-05-03 MED ORDER — VANCOMYCIN HCL IN DEXTROSE 1-5 GM/200ML-% IV SOLN
1000.0000 mg | Freq: Once | INTRAVENOUS | Status: AC
  Administered 2020-05-03: 1000 mg via INTRAVENOUS
  Filled 2020-05-03: qty 200

## 2020-05-03 MED ORDER — CHLORHEXIDINE GLUCONATE 0.12 % MT SOLN
15.0000 mL | OROMUCOSAL | Status: AC
  Administered 2020-05-03: 15 mL via OROMUCOSAL

## 2020-05-03 MED ORDER — ASPIRIN 81 MG PO CHEW: 324.0000 mg | CHEWABLE_TABLET | Freq: Every day | ORAL | Status: DC

## 2020-05-03 MED ORDER — FENTANYL CITRATE (PF) 250 MCG/5ML IJ SOLN
INTRAMUSCULAR | Status: AC
  Filled 2020-05-03: qty 25

## 2020-05-03 MED ORDER — MORPHINE SULFATE (PF) 2 MG/ML IV SOLN: 1.0000 mg | INTRAVENOUS | Status: DC | PRN

## 2020-05-03 MED ORDER — LACTATED RINGERS IV SOLN: INTRAVENOUS | Status: DC

## 2020-05-03 MED ORDER — METOPROLOL TARTRATE 5 MG/5ML IV SOLN: 2.5000 mg | INTRAVENOUS | Status: DC | PRN

## 2020-05-03 MED ORDER — METOPROLOL TARTRATE 25 MG/10 ML ORAL SUSPENSION: 12.5000 mg | Freq: Two times a day (BID) | ORAL | Status: DC

## 2020-05-03 MED ORDER — HEPARIN SODIUM (PORCINE) 1000 UNIT/ML IJ SOLN
INTRAMUSCULAR | Status: DC | PRN
  Administered 2020-05-03: 2000 [IU] via INTRAVENOUS
  Administered 2020-05-03: 19000 [IU] via INTRAVENOUS

## 2020-05-03 MED ORDER — SODIUM CHLORIDE (PF) 0.9 % IJ SOLN
OROMUCOSAL | Status: DC | PRN
  Administered 2020-05-03 (×3): 4 mL via TOPICAL

## 2020-05-03 MED ORDER — OXYCODONE HCL 5 MG PO TABS: 5.0000 mg | ORAL_TABLET | ORAL | Status: DC | PRN

## 2020-05-03 MED ORDER — ARTIFICIAL TEARS OPHTHALMIC OINT
TOPICAL_OINTMENT | OPHTHALMIC | Status: DC | PRN
  Administered 2020-05-03: 1 via OPHTHALMIC

## 2020-05-03 MED ORDER — 0.9 % SODIUM CHLORIDE (POUR BTL) OPTIME
TOPICAL | Status: DC | PRN
  Administered 2020-05-03: 4000 mL

## 2020-05-03 MED ORDER — ONDANSETRON HCL 4 MG/2ML IJ SOLN
4.0000 mg | Freq: Four times a day (QID) | INTRAMUSCULAR | Status: DC | PRN
  Administered 2020-05-04: 4 mg via INTRAVENOUS
  Filled 2020-05-03: qty 2

## 2020-05-03 MED ORDER — SODIUM CHLORIDE 0.9 % IV SOLN: INTRAVENOUS | Status: DC | PRN

## 2020-05-03 MED ORDER — ATORVASTATIN CALCIUM 40 MG PO TABS
40.0000 mg | ORAL_TABLET | Freq: Every day | ORAL | Status: DC
  Administered 2020-05-04 – 2020-05-09 (×6): 40 mg via ORAL
  Filled 2020-05-03 (×6): qty 1

## 2020-05-03 MED ORDER — CALCIUM CHLORIDE 10 % IV SOLN
1.0000 g | Freq: Once | INTRAVENOUS | Status: AC
  Administered 2020-05-03: 1 g via INTRAVENOUS

## 2020-05-03 MED ORDER — ACETAMINOPHEN 650 MG RE SUPP
650.0000 mg | Freq: Once | RECTAL | Status: AC
  Administered 2020-05-03: 650 mg via RECTAL

## 2020-05-03 MED ORDER — ACETAMINOPHEN 500 MG PO TABS
1000.0000 mg | ORAL_TABLET | Freq: Four times a day (QID) | ORAL | Status: DC
  Administered 2020-05-03 – 2020-05-05 (×5): 1000 mg via ORAL
  Filled 2020-05-03 (×4): qty 2

## 2020-05-03 MED ORDER — BISACODYL 5 MG PO TBEC
10.0000 mg | DELAYED_RELEASE_TABLET | Freq: Every day | ORAL | Status: DC
  Administered 2020-05-04: 10 mg via ORAL
  Filled 2020-05-03: qty 2

## 2020-05-03 MED ORDER — HEMOSTATIC AGENTS (NO CHARGE) OPTIME
TOPICAL | Status: DC | PRN
  Administered 2020-05-03: 1 via TOPICAL

## 2020-05-03 MED ORDER — TRAMADOL HCL 50 MG PO TABS
50.0000 mg | ORAL_TABLET | ORAL | Status: DC | PRN
  Administered 2020-05-03: 50 mg via ORAL
  Filled 2020-05-03 (×3): qty 1

## 2020-05-03 MED ORDER — SODIUM BICARBONATE 8.4 % IV SOLN
50.0000 meq | Freq: Once | INTRAVENOUS | Status: AC
  Administered 2020-05-03: 50 meq via INTRAVENOUS

## 2020-05-03 MED ORDER — MIDAZOLAM HCL (PF) 10 MG/2ML IJ SOLN
INTRAMUSCULAR | Status: AC
  Filled 2020-05-03: qty 2

## 2020-05-03 MED ORDER — ALBUMIN HUMAN 5 % IV SOLN
250.0000 mL | INTRAVENOUS | Status: AC | PRN
  Administered 2020-05-03 (×4): 12.5 g via INTRAVENOUS
  Filled 2020-05-03 (×2): qty 250

## 2020-05-03 MED ORDER — ACETAMINOPHEN 160 MG/5ML PO SOLN
1000.0000 mg | Freq: Four times a day (QID) | ORAL | Status: DC
  Filled 2020-05-03: qty 40.6

## 2020-05-03 MED ORDER — PANTOPRAZOLE SODIUM 40 MG PO TBEC
40.0000 mg | DELAYED_RELEASE_TABLET | Freq: Every day | ORAL | Status: DC
  Administered 2020-05-05 – 2020-05-09 (×5): 40 mg via ORAL
  Filled 2020-05-03 (×5): qty 1

## 2020-05-03 MED ORDER — BISACODYL 10 MG RE SUPP: 10.0000 mg | Freq: Every day | RECTAL | Status: DC

## 2020-05-03 MED ORDER — DOCUSATE SODIUM 100 MG PO CAPS
200.0000 mg | ORAL_CAPSULE | Freq: Every day | ORAL | Status: DC
  Administered 2020-05-04: 200 mg via ORAL
  Filled 2020-05-03: qty 2

## 2020-05-03 MED ORDER — PROTAMINE SULFATE 10 MG/ML IV SOLN
INTRAVENOUS | Status: DC | PRN
  Administered 2020-05-03: 200 mg via INTRAVENOUS
  Administered 2020-05-03: 10 mg via INTRAVENOUS

## 2020-05-03 MED ORDER — SODIUM CHLORIDE 0.9 % IV SOLN: 250.0000 mL | INTRAVENOUS | Status: DC

## 2020-05-03 MED ORDER — NITROGLYCERIN IN D5W 200-5 MCG/ML-% IV SOLN: 0.0000 ug/min | INTRAVENOUS | Status: DC

## 2020-05-03 MED ORDER — ACETAMINOPHEN 160 MG/5ML PO SOLN: 650.0000 mg | Freq: Once | ORAL | Status: AC

## 2020-05-03 MED ORDER — INSULIN REGULAR(HUMAN) IN NACL 100-0.9 UT/100ML-% IV SOLN: INTRAVENOUS | Status: DC

## 2020-05-03 MED ORDER — ROCURONIUM BROMIDE 10 MG/ML (PF) SYRINGE
PREFILLED_SYRINGE | INTRAVENOUS | Status: DC | PRN
  Administered 2020-05-03 (×3): 50 mg via INTRAVENOUS
  Administered 2020-05-03: 100 mg via INTRAVENOUS

## 2020-05-03 MED ORDER — ASPIRIN EC 325 MG PO TBEC
325.0000 mg | DELAYED_RELEASE_TABLET | Freq: Every day | ORAL | Status: DC
  Administered 2020-05-04 – 2020-05-09 (×6): 325 mg via ORAL
  Filled 2020-05-03 (×6): qty 1

## 2020-05-03 MED ORDER — MIDAZOLAM HCL 2 MG/2ML IJ SOLN: 2.0000 mg | INTRAMUSCULAR | Status: DC | PRN

## 2020-05-03 MED ORDER — CHLORHEXIDINE GLUCONATE CLOTH 2 % EX PADS
6.0000 | MEDICATED_PAD | Freq: Every day | CUTANEOUS | Status: DC
  Administered 2020-05-03 – 2020-05-08 (×5): 6 via TOPICAL

## 2020-05-03 MED ORDER — FENTANYL CITRATE (PF) 250 MCG/5ML IJ SOLN
INTRAMUSCULAR | Status: DC | PRN
  Administered 2020-05-03: 50 ug via INTRAVENOUS
  Administered 2020-05-03: 250 ug via INTRAVENOUS
  Administered 2020-05-03: 100 ug via INTRAVENOUS
  Administered 2020-05-03: 200 ug via INTRAVENOUS
  Administered 2020-05-03: 50 ug via INTRAVENOUS
  Administered 2020-05-03: 100 ug via INTRAVENOUS
  Administered 2020-05-03: 150 ug via INTRAVENOUS
  Administered 2020-05-03 (×2): 100 ug via INTRAVENOUS

## 2020-05-03 MED ORDER — PHENYLEPHRINE HCL-NACL 20-0.9 MG/250ML-% IV SOLN: 0.0000 ug/min | INTRAVENOUS | Status: DC

## 2020-05-03 MED ORDER — SODIUM CHLORIDE 0.9% FLUSH
3.0000 mL | Freq: Two times a day (BID) | INTRAVENOUS | Status: DC
  Administered 2020-05-04 (×2): 3 mL via INTRAVENOUS

## 2020-05-03 MED ORDER — LACTATED RINGERS IV SOLN: 500.0000 mL | Freq: Once | INTRAVENOUS | Status: DC | PRN

## 2020-05-03 MED ORDER — MIDAZOLAM HCL 5 MG/5ML IJ SOLN
INTRAMUSCULAR | Status: DC | PRN
  Administered 2020-05-03: 1 mg via INTRAVENOUS
  Administered 2020-05-03: 2 mg via INTRAVENOUS
  Administered 2020-05-03: 1 mg via INTRAVENOUS
  Administered 2020-05-03 (×3): 2 mg via INTRAVENOUS

## 2020-05-03 MED ORDER — POTASSIUM CHLORIDE 10 MEQ/50ML IV SOLN: 10.0000 meq | INTRAVENOUS | Status: AC

## 2020-05-03 SURGICAL SUPPLY — 84 items
BAG DECANTER FOR FLEXI CONT (MISCELLANEOUS) ×4 IMPLANT
BLADE CLIPPER SURG (BLADE) ×4 IMPLANT
BLADE STERNUM SYSTEM 6 (BLADE) ×4 IMPLANT
BLADE SURG 11 STRL SS (BLADE) ×2 IMPLANT
BNDG ELASTIC 4X5.8 VLCR STR LF (GAUZE/BANDAGES/DRESSINGS) ×4 IMPLANT
BNDG ELASTIC 6X5.8 VLCR STR LF (GAUZE/BANDAGES/DRESSINGS) ×4 IMPLANT
BNDG GAUZE ELAST 4 BULKY (GAUZE/BANDAGES/DRESSINGS) ×4 IMPLANT
CANISTER SUCT 3000ML PPV (MISCELLANEOUS) ×4 IMPLANT
CANNULA EZ GLIDE AORTIC 21FR (CANNULA) ×4 IMPLANT
CATH CPB KIT HENDRICKSON (MISCELLANEOUS) ×4 IMPLANT
CATH ROBINSON RED A/P 18FR (CATHETERS) ×4 IMPLANT
CATH THORACIC 36FR (CATHETERS) ×4 IMPLANT
CATH THORACIC 36FR RT ANG (CATHETERS) ×4 IMPLANT
CLIP VESOCCLUDE MED 24/CT (CLIP) IMPLANT
CLIP VESOCCLUDE SM WIDE 24/CT (CLIP) ×4 IMPLANT
CONTAINER PROTECT SURGISLUSH (MISCELLANEOUS) ×2 IMPLANT
DEFOGGER ANTIFOG KIT (MISCELLANEOUS) ×2 IMPLANT
DRAPE CARDIOVASCULAR INCISE (DRAPES) ×4
DRAPE SLUSH/WARMER DISC (DRAPES) ×4 IMPLANT
DRAPE SRG 135X102X78XABS (DRAPES) ×2 IMPLANT
DRSG COVADERM 4X14 (GAUZE/BANDAGES/DRESSINGS) ×4 IMPLANT
ELECT REM PT RETURN 9FT ADLT (ELECTROSURGICAL) ×8
ELECTRODE REM PT RTRN 9FT ADLT (ELECTROSURGICAL) ×4 IMPLANT
FELT TEFLON 1X6 (MISCELLANEOUS) ×8 IMPLANT
GAUZE SPONGE 4X4 12PLY STRL (GAUZE/BANDAGES/DRESSINGS) ×8 IMPLANT
GAUZE SPONGE 4X4 12PLY STRL LF (GAUZE/BANDAGES/DRESSINGS) ×4 IMPLANT
GLOVE SURG SIGNA 7.5 PF LTX (GLOVE) ×12 IMPLANT
GOWN STRL REUS W/ TWL LRG LVL3 (GOWN DISPOSABLE) ×8 IMPLANT
GOWN STRL REUS W/ TWL XL LVL3 (GOWN DISPOSABLE) ×4 IMPLANT
GOWN STRL REUS W/TWL LRG LVL3 (GOWN DISPOSABLE) ×40
GOWN STRL REUS W/TWL XL LVL3 (GOWN DISPOSABLE) ×8
HEMOSTAT POWDER SURGIFOAM 1G (HEMOSTASIS) ×12 IMPLANT
HEMOSTAT SURGICEL 2X14 (HEMOSTASIS) ×4 IMPLANT
INSERT FOGARTY XLG (MISCELLANEOUS) IMPLANT
KIT BASIN OR (CUSTOM PROCEDURE TRAY) ×4 IMPLANT
KIT SUCTION CATH 14FR (SUCTIONS) ×8 IMPLANT
KIT TURNOVER KIT B (KITS) ×4 IMPLANT
KIT VASOVIEW HEMOPRO 2 VH 4000 (KITS) ×4 IMPLANT
MARKER GRAFT CORONARY BYPASS (MISCELLANEOUS) ×12 IMPLANT
NS IRRIG 1000ML POUR BTL (IV SOLUTION) ×20 IMPLANT
PACK E OPEN HEART (SUTURE) ×4 IMPLANT
PACK OPEN HEART (CUSTOM PROCEDURE TRAY) ×4 IMPLANT
PAD ARMBOARD 7.5X6 YLW CONV (MISCELLANEOUS) ×8 IMPLANT
PAD ELECT DEFIB RADIOL ZOLL (MISCELLANEOUS) ×4 IMPLANT
PENCIL BUTTON HOLSTER BLD 10FT (ELECTRODE) ×4 IMPLANT
POSITIONER HEAD DONUT 9IN (MISCELLANEOUS) ×4 IMPLANT
PUNCH AORTIC ROTATE 4.0MM (MISCELLANEOUS) ×2 IMPLANT
PUNCH AORTIC ROTATE 4.5MM 8IN (MISCELLANEOUS) IMPLANT
PUNCH AORTIC ROTATE 5MM 8IN (MISCELLANEOUS) IMPLANT
SET CARDIOPLEGIA MPS 5001102 (MISCELLANEOUS) ×2 IMPLANT
SUPPORT HEART JANKE-BARRON (MISCELLANEOUS) ×4 IMPLANT
SUT BONE WAX W31G (SUTURE) ×4 IMPLANT
SUT ETHIBOND 2 0 SH (SUTURE) ×4
SUT ETHIBOND 2 0 SH 36X2 (SUTURE) IMPLANT
SUT MNCRL AB 4-0 PS2 18 (SUTURE) ×2 IMPLANT
SUT PROLENE 3 0 SH DA (SUTURE) ×4 IMPLANT
SUT PROLENE 4 0 RB 1 (SUTURE) ×4
SUT PROLENE 4 0 SH DA (SUTURE) IMPLANT
SUT PROLENE 4-0 RB1 .5 CRCL 36 (SUTURE) IMPLANT
SUT PROLENE 6 0 C 1 30 (SUTURE) ×12 IMPLANT
SUT PROLENE 7 0 BV 1 (SUTURE) ×2 IMPLANT
SUT PROLENE 7 0 BV1 MDA (SUTURE) ×4 IMPLANT
SUT PROLENE 8 0 BV175 6 (SUTURE) IMPLANT
SUT STEEL 6MS V (SUTURE) ×4 IMPLANT
SUT STEEL STERNAL CCS#1 18IN (SUTURE) IMPLANT
SUT STEEL SZ 6 DBL 3X14 BALL (SUTURE) ×4 IMPLANT
SUT VIC AB 1 CTX 36 (SUTURE) ×8
SUT VIC AB 1 CTX36XBRD ANBCTR (SUTURE) ×4 IMPLANT
SUT VIC AB 2-0 CT1 27 (SUTURE) ×8
SUT VIC AB 2-0 CT1 TAPERPNT 27 (SUTURE) IMPLANT
SUT VIC AB 2-0 CTX 27 (SUTURE) IMPLANT
SUT VIC AB 3-0 SH 27 (SUTURE)
SUT VIC AB 3-0 SH 27X BRD (SUTURE) IMPLANT
SUT VIC AB 3-0 X1 27 (SUTURE) IMPLANT
SUT VICRYL 4-0 PS2 18IN ABS (SUTURE) IMPLANT
SYSTEM SAHARA CHEST DRAIN ATS (WOUND CARE) ×4 IMPLANT
TAPE CLOTH SURG 4X10 WHT LF (GAUZE/BANDAGES/DRESSINGS) ×4 IMPLANT
TAPE PAPER 2X10 WHT MICROPORE (GAUZE/BANDAGES/DRESSINGS) ×2 IMPLANT
TOWEL GREEN STERILE (TOWEL DISPOSABLE) ×4 IMPLANT
TOWEL GREEN STERILE FF (TOWEL DISPOSABLE) ×4 IMPLANT
TRAY FOLEY SLVR 16FR TEMP STAT (SET/KITS/TRAYS/PACK) ×4 IMPLANT
TUBING LAP HI FLOW INSUFFLATIO (TUBING) ×4 IMPLANT
UNDERPAD 30X36 HEAVY ABSORB (UNDERPADS AND DIAPERS) ×4 IMPLANT
WATER STERILE IRR 1000ML POUR (IV SOLUTION) ×8 IMPLANT

## 2020-05-03 NOTE — Anesthesia Procedure Notes (Signed)
Arterial Line Insertion Start/End4/20/2022 7:50 AM, 05/03/2020 8:00 AM Performed by: Catalina Gravel, MD, Griffin Dakin, CRNA, CRNA  Patient location: Pre-op. Preanesthetic checklist: patient identified, IV checked, site marked, risks and benefits discussed, surgical consent, monitors and equipment checked, pre-op evaluation, timeout performed and anesthesia consent Lidocaine 1% used for infiltration Right, radial was placed Catheter size: 20 G Hand hygiene performed  and maximum sterile barriers used   Attempts: 1 Procedure performed without using ultrasound guided technique. Following insertion, dressing applied and Biopatch. Post procedure assessment: normal and unchanged  Patient tolerated the procedure well with no immediate complications.

## 2020-05-03 NOTE — Procedures (Signed)
Extubation Procedure Note  Patient Details:   Name: [name] DOB: 1940-07-04 MRN: [id number]   Airway Documentation:  Airway 8 mm (Active)  Secured at (cm) 22 cm 05/03/20 1954  Measured From Lips 05/03/20 1954  Secured Location Right 05/03/20 1954  Secured By Pink Tape 05/03/20 1954  Site Condition Dry 05/03/20 1954   Vent end date: (not recorded) Vent end time: (not recorded)   Evaluation  O2 sats: stable throughout Complications: No apparent complications Patient did tolerate procedure well.     Yes   Pt. Extubated per Rapid wean protocol. Prior to extubation, pt. Performed -23 on the NIF and .8L on the vital capacity. Pt. Also had a positive leak. Pt. Placed on 4L nasal cannula and tolerating well. Pt. Also performed 500x5 on the IS. RN at bedside. No issues at this time.  Marlowe Aschoff 05/03/2020, 9:41 PM

## 2020-05-03 NOTE — Anesthesia Procedure Notes (Signed)
Procedure Name: Intubation Date/Time: 05/03/2020 8:54 AM Performed by: Griffin Dakin, CRNA Pre-anesthesia Checklist: Patient identified, Emergency Drugs available, Suction available and Patient being monitored Patient Re-evaluated:Patient Re-evaluated prior to induction Oxygen Delivery Method: Circle system utilized Preoxygenation: Pre-oxygenation with 100% oxygen Induction Type: IV induction Ventilation: Mask ventilation without difficulty Laryngoscope Size: Mac and 3 Grade View: Grade I Tube type: Oral Tube size: 8.0 mm Number of attempts: 1 Airway Equipment and Method: Stylet and Oral airway Placement Confirmation: ETT inserted through vocal cords under direct vision,  positive ETCO2 and breath sounds checked- equal and bilateral Secured at: 22 cm Tube secured with: Tape Dental Injury: Teeth and Oropharynx as per pre-operative assessment

## 2020-05-03 NOTE — Anesthesia Procedure Notes (Signed)
Central Venous Catheter Insertion Performed by: Albertha Ghee, MD, anesthesiologist Start/End4/20/2022 7:52 AM, 05/03/2020 7:54 AM Patient location: Pre-op. Preanesthetic checklist: patient identified, IV checked, site marked, risks and benefits discussed, surgical consent, monitors and equipment checked, pre-op evaluation, timeout performed and anesthesia consent Hand hygiene performed  and maximum sterile barriers used  PA cath was placed.Swan type:thermodilution Procedure performed without using ultrasound guided technique. Attempts: 1 Patient tolerated the procedure well with no immediate complications.

## 2020-05-03 NOTE — Transfer of Care (Signed)
Immediate Anesthesia Transfer of Care Note  Patient: KLAIR LEISING  Procedure(s) Performed: CORONARY ARTERY BYPASS GRAFTING (CABG) X FOUR ON PUMP USING LEFT INTERNAL MAMMARY ARTERY AND RIGHT ENDOSCOPIC SAPHEOUS VEIN HARVEST CONDUITS (N/A Chest) TRANSESOPHAGEAL ECHOCARDIOGRAM (TEE) (N/A )  Patient Location: ICU  Anesthesia Type:General  Level of Consciousness: Patient remains intubated per anesthesia plan  Airway & Oxygen Therapy: Patient remains intubated per anesthesia plan and Patient placed on Ventilator (see vital sign flow sheet for setting)  Post-op Assessment: Post -op Vital signs reviewed and stable  Post vital signs: Reviewed and stable  Last Vitals:  Vitals Value Taken Time  BP 111/58 05/03/20 1338  Temp 36.9 C 05/03/20 1348  Pulse 90 05/03/20 1348  Resp 14 05/03/20 1348  SpO2 100 % 05/03/20 1348  Vitals shown include unvalidated device data.  Last Pain:  Vitals:   05/03/20 0355  TempSrc: Oral  PainSc: 0-No pain         Complications: No complications documented.

## 2020-05-03 NOTE — Progress Notes (Signed)
Wean Protocol initiated

## 2020-05-03 NOTE — Progress Notes (Signed)
ANTICOAGULATION CONSULT NOTE - Follow Up Consult  Pharmacy Consult for Heparin Indication: mvCAD awaiting CABG  Allergies  Allergen Reactions  . Ibuprofen Hives  . Penicillins Hives    Tolerated Ancef in the past  . Aleve [Naproxen Sodium] Hives  . Avapro  [Irbesartan] Rash    Other reaction(s): Weal  . Azithromycin Hives  . Nickel Itching    Redness, itching and fluid discharge with nickel earrings  . Other Other (See Comments)    Nickel"ears ran fluid and itching" redness and "sensitive" Nickel"ears ran fluid and itching"    Patient Measurements: Height: 5' (152.4 cm) Weight: 57.7 kg (127 lb 1.6 oz) IBW/kg (Calculated) : 45.5 Heparin Dosing Weight: 57 kg  Vital Signs: Temp: 98.2 F (36.8 C) (04/20 0355) Temp Source: Oral (04/20 0355) BP: 142/80 (04/20 0401) Pulse Rate: 85 (04/20 0401)  Labs: Recent Labs     0000 04/30/20 1142 04/30/20 1238 04/30/20 1616 04/30/20 2218 05/01/20 0555 05/02/20 0634 05/02/20 1659 05/03/20 0155  HGB   < >  --  13.0  --   --  12.4 12.1  --  11.8*  HCT   < >  --  37.1  --   --  35.3* 36.2  --  34.7*  PLT   < >  --  213  --   --  180 187  --  174  APTT  --  27  --   --   --   --   --   --   --   LABPROT  --  13.7  --   --   --   --   --   --   --   INR  --  1.1  --   --   --   --   --   --   --   HEPARINUNFRC  --   --   --   --    < >  --  0.14* 0.23* 0.40  CREATININE   < >  --  0.57  --   --  0.61 0.60  --  0.67  TROPONINIHS  --   --  19,201* 44,967*  --   --   --   --   --    < > = values in this interval not displayed.    Estimated Creatinine Clearance: 46.9 mL/min (by C-G formula based on SCr of 0.67 mg/dL).   Medications:  Infusions:  . cefUROXime (ZINACEF)  IV    . cefUROXime (ZINACEF)  IV    . dexmedetomidine    . heparin 30,000 units/NS 1000 mL solution for CELLSAVER    . heparin 1,300 Units/hr (05/03/20 0100)  . milrinone    . nitroGLYCERIN    . norepinephrine    . tranexamic acid (CYKLOKAPRON) infusion ([facility])     . vancomycin     Assessment: 79 YOF transferred to [facility] for CVTS eval after cardiac cath at [facility] revealed mvCAD. Plans noted for CABG today -heparin level = 0.4, hg= 11.8   Goal of Therapy:  Heparin level 0.3-0.7 units/ml Monitor platelets by anticoagulation protocol: Yes   Plan:  -No heparin changes needed -Heparin to be off for surgery today  Hildred Laser, PharmD Clinical Pharmacist **Pharmacist phone directory can now be found on [email or web address] ([email or web address]).  Listed under [place].

## 2020-05-03 NOTE — Progress Notes (Signed)
TCTS BRIEF SICU PROGRESS NOTE  Day of Surgery  S/P Procedure(s) (LRB): CORONARY ARTERY BYPASS GRAFTING (CABG) X FOUR ON PUMP USING LEFT INTERNAL MAMMARY ARTERY AND RIGHT ENDOSCOPIC SAPHEOUS VEIN HARVEST CONDUITS (N/A) TRANSESOPHAGEAL ECHOCARDIOGRAM (TEE) (N/A)   Sedated on vent AV paced w/ stable BP on low dose levophed, PA pressures relatively low cardiac index 1.5 O2 sats 98-99% Chest tube output low UOP 75-100 mL/hr Labs okay  Plan: Continue current plan.  May need more volume.  Rexene Alberts, MD 05/03/2020 6:41 PM

## 2020-05-03 NOTE — Brief Op Note (Signed)
05/01/2020 - 05/03/2020  3:09 PM  PATIENT:  [name]  80 y.o. female  PRE-OPERATIVE DIAGNOSIS:  CORONARY ARTERY DISEASE  POST-OPERATIVE DIAGNOSIS:  CORONARY ARTERY DISEASE  PROCEDURE:  Procedure(s): CORONARY ARTERY BYPASS GRAFTING (CABG) X FOUR ON PUMP USING LEFT INTERNAL MAMMARY ARTERY AND RIGHT ENDOSCOPIC SAPHEOUS VEIN HARVEST CONDUITS (N/A) TRANSESOPHAGEAL ECHOCARDIOGRAM (TEE) (N/A)   LIMA to LAD Sequential SVG to PDA and OM3sequential) SVG to OM1  Vein Harvest time: 30 minutes, prep time: 10 minutes  SURGEON:  Surgeon(s) and Role:    * Melrose Nakayama, MD - Primary  PHYSICIAN ASSISTANT:  Nicholes Rough, PA  ANESTHESIA:   general  EBL: see anesthesia record  BLOOD ADMINISTERED:none  DRAINS: routine   LOCAL MEDICATIONS USED:  NONE  SPECIMEN:  No Specimen  DISPOSITION OF SPECIMEN:  N/A  COUNTS:  YES  DICTATION: .Dragon Dictation  PLAN OF CARE: Admit to inpatient   PATIENT DISPOSITION:  PACU - hemodynamically stable.   Delay start of Pharmacological VTE agent (>24hrs) due to surgical blood loss or risk of bleeding: yes  XC= 60 min, CPB= 102 min

## 2020-05-03 NOTE — Interval H&P Note (Signed)
History and Physical Interval Note:  05/03/2020 8:28 AM  [name]  has presented today for surgery, with the diagnosis of CAD.  The various methods of treatment have been discussed with the patient and family. After consideration of risks, benefits and other options for treatment, the patient has consented to  Procedure(s): CORONARY ARTERY BYPASS GRAFTING (CABG) (N/A) TRANSESOPHAGEAL ECHOCARDIOGRAM (TEE) (N/A) as a surgical intervention.  The patient's history has been reviewed, patient examined, no change in status, stable for surgery.  I have reviewed the patient's chart and labs.  Questions were answered to the patient's satisfaction.     Melrose Nakayama

## 2020-05-03 NOTE — Anesthesia Procedure Notes (Signed)
Central Venous Catheter Insertion Performed by: Albertha Ghee, MD, anesthesiologist Start/End4/20/2022 7:40 AM, 05/03/2020 7:52 AM Patient location: Pre-op. Preanesthetic checklist: patient identified, IV checked, site marked, risks and benefits discussed, surgical consent, monitors and equipment checked, pre-op evaluation, timeout performed and anesthesia consent Lidocaine 1% used for infiltration and patient sedated Hand hygiene performed  and maximum sterile barriers used  Catheter size: 9 Fr Central line was placed.MAC introducer Procedure performed using ultrasound guided technique. Ultrasound Notes:anatomy identified, needle tip was noted to be adjacent to the nerve/plexus identified, no ultrasound evidence of intravascular and/or intraneural injection and image(s) printed for medical record Attempts: 1 Following insertion, line sutured and dressing applied. Post procedure assessment: blood return through all ports, free fluid flow and no air  Patient tolerated the procedure well with no immediate complications.

## 2020-05-04 ENCOUNTER — Inpatient Hospital Stay (HOSPITAL_COMMUNITY): Payer: Medicare Other

## 2020-05-04 ENCOUNTER — Other Ambulatory Visit: Payer: Self-pay

## 2020-05-04 ENCOUNTER — Encounter (HOSPITAL_COMMUNITY): Payer: Self-pay | Admitting: Thoracic Surgery (Cardiothoracic Vascular Surgery)

## 2020-05-04 LAB — BASIC METABOLIC PANEL
Anion gap: 6 (ref 5–15)
Anion gap: 6 (ref 5–15)
BUN: 13 mg/dL (ref 8–23)
BUN: 14 mg/dL (ref 8–23)
CO2: 22 mmol/L (ref 22–32)
CO2: 25 mmol/L (ref 22–32)
Calcium: 8.6 mg/dL — ABNORMAL LOW (ref 8.9–10.3)
Calcium: 9 mg/dL (ref 8.9–10.3)
Chloride: 108 mmol/L (ref 98–111)
Chloride: 106 mmol/L (ref 98–111)
Creatinine, Ser: 0.76 mg/dL (ref 0.44–1.00)
Creatinine, Ser: 0.68 mg/dL (ref 0.44–1.00)
GFR, Estimated: >60 mL/min (ref >60)
GFR, Estimated: >60 mL/min (ref >60)
Glucose, Bld: 116 mg/dL — ABNORMAL HIGH (ref 70–99)
Glucose, Bld: 118 mg/dL — ABNORMAL HIGH (ref 70–99)
Potassium: 4.1 mmol/L (ref 3.5–5.1)
Potassium: 3.6 mmol/L (ref 3.5–5.1)
Sodium: 136 mmol/L (ref 135–145)
Sodium: 137 mmol/L (ref 135–145)

## 2020-05-04 LAB — GLUCOSE, CAPILLARY
Glucose-Capillary: 114 mg/dL — ABNORMAL HIGH (ref 70–99)
Glucose-Capillary: 118 mg/dL — ABNORMAL HIGH (ref 70–99)
Glucose-Capillary: 126 mg/dL — ABNORMAL HIGH (ref 70–99)
Glucose-Capillary: 127 mg/dL — ABNORMAL HIGH (ref 70–99)
Glucose-Capillary: 126 mg/dL — ABNORMAL HIGH (ref 70–99)
Glucose-Capillary: 104 mg/dL — ABNORMAL HIGH (ref 70–99)
Glucose-Capillary: 126 mg/dL — ABNORMAL HIGH (ref 70–99)

## 2020-05-04 LAB — CBC
HCT: 28.3 % — ABNORMAL LOW (ref 36.0–46.0)
HCT: 29.2 % — ABNORMAL LOW (ref 36.0–46.0)
Hemoglobin: 9.2 g/dL — ABNORMAL LOW (ref 12.0–15.0)
Hemoglobin: 9.4 g/dL — ABNORMAL LOW (ref 12.0–15.0)
MCH: 33 pg (ref 26.0–34.0)
MCH: 32.1 pg (ref 26.0–34.0)
MCHC: 32.5 g/dL (ref 30.0–36.0)
MCHC: 32.2 g/dL (ref 30.0–36.0)
MCV: 101.4 fL — ABNORMAL HIGH (ref 80.0–100.0)
MCV: 99.7 fL (ref 80.0–100.0)
Platelets: 116 10*3/uL — ABNORMAL LOW (ref 150–400)
Platelets: 142 10*3/uL — ABNORMAL LOW (ref 150–400)
RBC: 2.79 MIL/uL — ABNORMAL LOW (ref 3.87–5.11)
RBC: 2.93 MIL/uL — ABNORMAL LOW (ref 3.87–5.11)
RDW: 13.8 % (ref 11.5–15.5)
RDW: 13.8 % (ref 11.5–15.5)
WBC: 9 10*3/uL (ref 4.0–10.5)
WBC: 10.1 10*3/uL (ref 4.0–10.5)
nRBC: 0 % (ref 0.0–0.2)
nRBC: 0 % (ref 0.0–0.2)

## 2020-05-04 LAB — ECHO INTRAOPERATIVE TEE
Height: 60 in
Weight: 2033.6 oz

## 2020-05-04 LAB — MAGNESIUM
Magnesium: 2.4 mg/dL (ref 1.7–2.4)
Magnesium: 2 mg/dL (ref 1.7–2.4)

## 2020-05-04 MED ORDER — INSULIN ASPART 100 UNIT/ML ~~LOC~~ SOLN
0.0000 [IU] | SUBCUTANEOUS | Status: DC
  Administered 2020-05-04 (×2): 2 [IU] via SUBCUTANEOUS

## 2020-05-04 MED ORDER — POTASSIUM CHLORIDE CRYS ER 20 MEQ PO TBCR
20.0000 meq | EXTENDED_RELEASE_TABLET | Freq: Once | ORAL | Status: AC
  Administered 2020-05-04: 20 meq via ORAL
  Filled 2020-05-04: qty 1

## 2020-05-04 MED ORDER — INSULIN ASPART 100 UNIT/ML ~~LOC~~ SOLN
3.0000 [IU] | Freq: Three times a day (TID) | SUBCUTANEOUS | Status: DC
  Administered 2020-05-06 – 2020-05-09 (×5): 3 [IU] via SUBCUTANEOUS

## 2020-05-04 MED ORDER — ENOXAPARIN SODIUM 40 MG/0.4ML ~~LOC~~ SOLN
40.0000 mg | Freq: Every day | SUBCUTANEOUS | Status: DC
  Administered 2020-05-04: 40 mg via SUBCUTANEOUS
  Filled 2020-05-04: qty 0.4

## 2020-05-04 MED ORDER — FUROSEMIDE 10 MG/ML IJ SOLN
40.0000 mg | Freq: Once | INTRAMUSCULAR | Status: AC
  Administered 2020-05-04: 40 mg via INTRAVENOUS
  Filled 2020-05-04: qty 4

## 2020-05-04 MED ORDER — POTASSIUM CHLORIDE 10 MEQ/50ML IV SOLN
10.0000 meq | INTRAVENOUS | Status: AC
  Administered 2020-05-04 (×3): 10 meq via INTRAVENOUS
  Filled 2020-05-04 (×3): qty 50

## 2020-05-04 MED FILL — Heparin Sodium (Porcine) Inj 1000 Unit/ML: INTRAMUSCULAR | Qty: 30 | Status: AC

## 2020-05-04 MED FILL — Potassium Chloride Inj 2 mEq/ML: INTRAVENOUS | Qty: 40 | Status: AC

## 2020-05-04 MED FILL — Magnesium Sulfate Inj 50%: INTRAMUSCULAR | Qty: 10 | Status: AC

## 2020-05-04 NOTE — Plan of Care (Signed)
  Problem: Clinical Measurements: Goal: Ability to maintain clinical measurements within normal limits will improve Outcome: Progressing Goal: Diagnostic test results will improve Outcome: Progressing Goal: Respiratory complications will improve Outcome: Progressing Goal: Cardiovascular complication will be avoided Outcome: Progressing   Problem: Activity: Goal: Risk for activity intolerance will decrease Outcome: Progressing   Problem: Nutrition: Goal: Adequate nutrition will be maintained Outcome: Progressing   Problem: Coping: Goal: Level of anxiety will decrease Outcome: Progressing   Problem: Pain Managment: Goal: General experience of comfort will improve Outcome: Progressing   Problem: Activity: Goal: Risk for activity intolerance will decrease Outcome: Progressing   Problem: Cardiac: Goal: Will achieve and/or maintain hemodynamic stability Outcome: Progressing

## 2020-05-04 NOTE — Discharge Summary (Signed)
Physician Discharge Summary       [place]       [place] [address]             [phone]      Patient ID: TATTIANNA SCHNARR MRN: [id number] DOB/AGE: May 03, 1940 80 y.o.  Admit date: 05/01/2020 Discharge date: 05/09/2020  Admission Diagnoses:   Patient Active Problem List   Diagnosis Date Noted  . NSTEMI (non-ST elevated myocardial infarction) ([facility]) 04/30/2020  . Elevated troponin 04/30/2020  . History of left breast cancer 11/25/2019  . Hyperlipidemia 11/25/2019  . Carotid stenosis 08/24/2017  . 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  . Pain 01/22/2016  . 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/2016  . PAD (peripheral artery disease) ([facility] [facility]) 11/22/2014  . H/O malignant neoplasm of breast 11/22/2014  . HLD (hyperlipidemia) 11/22/2014  . Essential hypertension 11/22/2014  . 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  She had no prior history of heart disease.  She is a former smoker having quit approximately 20 years ago.  She reports having chest discomfort for several days but had an episode on the evening prior to admission that was much worse.  She described a sharp 10/10 pain in her left chest radiating to both arms.  She had associated clammy skin, nausea, shortness of breath, but no vomiting.  She presented to [facility] ED where EKG demonstrated sinus rhythm with a new left  bundle branch block.  Her initial troponin was in excess of 19,000.  Chest x-ray demonstrated a mass in the posterior left lung.  This was followed up with a CTA that reveals a 3.3 x 2.5 x 2.5 cm left lower lobe lung mass that could represent "malignancy, cavitation, or abscess".  Her chest pain subsided in the ED, she was treated with aspirin and started on heparin infusion.  She was admitted to the hospital and cardiology consultation was provided.  Left heart catheterization was performed earlier today.  There was a significant pressure dampening of the catheter as it engaged the left main coronary artery that ultimately proved to be 80% stenosed.  Additionally, there was were 80% proximal lesions in both the LAD and circumflex coronary arteries.  The mid right coronary artery was totally occluded with left-to-right collaterals.  Left ventricular ejection fraction was estimated at 35 to 45%.  Ms. Osberg was transferred to [facility] and our service has been kindly asked to evaluate her for consideration of coronary bypass grafting. Currently, Ms. Dermody is resting in bed with her daughter and minister at the bedside.  She denies having any chest pain.  Hospital Course:   The patient underwent a CABG x4 with Dr. Roxan Hockey. She tolerated the procedure well and was transferred to the surgical ICU for continued care. She was extubated in a timely manner. POD 1 we discontinued her chest tubes. She didn't need any drips at this time. We transitioned her  off her insulin drip. Her cardiac index was good therefore we discontinue her swan ganz catheter. Her BP remained stable but her urine output decreased. Since she was about 10 lbs fluid overloaded, we administered lasix x 1. We continued to encourage ambulation and use of her incentive spirometer. She was off all drips and she was ambulating around the unit POD 2. She was transferred to the step-down unit that same day.   She was diuresed over the next  few days with appropriate response.  She regained independence with her mobility.  She was noted to have several loose stools early postoperatively.  Abdominal exam remained benign.  She did not have a significant leukocytosis and she remained afebrile.  She was treated symptomatically with oral Imodium.  Appetite gradually improved.  She had mild hypotension and tachycardia so the metoprolol was titrated accordingly.  Epicardial pacer wires were removed routinely on postop day 4.  Her surgical incisions are healing without evidence of infection.  She continues to ambulate without difficulty.  She is medically stable for discharge home today.  Significant Diagnostic Studies:  Diagnostic Dominance: Right  Left Main  Ost LM to Dist LM lesion is 80% stenosed. The lesion is severely calcified.  Left Anterior Descending  Ost LAD to Prox LAD lesion is 80% stenosed. The lesion is eccentric. The lesion is severely calcified.  Left Circumflex  Ost Cx to Prox Cx lesion is 80% stenosed. The lesion is severely calcified.  Right Coronary Artery  Ost RCA to Prox RCA lesion is 60% stenosed. The lesion is severely calcified.  Mid RCA to Dist RCA lesion is 100% stenosed.  Right Posterior Atrioventricular Artery  Collaterals  RPAV filled by collaterals from RV Branch.    First Right Posterolateral Branch  Collaterals  1st RPL filled by collaterals from Dist LAD.     Intervention   No interventions have been documented.  Wall Motion  Resting                Left Heart  Left Ventricle The left ventricular size is normal. There is moderate left ventricular systolic dysfunction. LV end diastolic pressure is mildly elevated. The left ventricular ejection fraction is 35-45% by visual estimate. There are LV function abnormalities due to global hypokinesis.   Coronary Diagrams   Diagnostic Dominance: Right    Intervention     Treatments:  NAME: MAECI, KALBFLEISCH MEDICAL RECORD NO:  [id number] ACCOUNT NO: [id number] DATE OF BIRTH: [birth year] FACILITY: [facility] LOCATION: [facility] PHYSICIAN: Revonda Standard. 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  PROCEDURE:   Median sternotomy, extracorporeal circulation, Coronary artery bypass grafting x 4      Left internal mammary artery to LAD      Sequential saphenous vein graft to posterior descending and obtuse marginal 3      Saphenous vein graft to the obtuse marginal 1. Endoscopic vein harvest, right thigh.  SURGEON:  Revonda Standard. Roxan Hockey, MD  ASSISTANT:  Konrad Saha, PA  ANESTHESIA:  General.  FINDINGS:  The posterior descending was a 1 mm poor quality target. Remaining targets were good quality.  Good quality conduits.  Transesophageal echocardiography showed ejection fraction of approximately 40-45% with mild mitral regurgitation.  Postbypass exam  unchanged.  Discharge Exam: Blood pressure (!) 150/70, pulse (!) 108, temperature 98.2 F (36.8 C), temperature source Oral, resp. rate  20, height 5' (1.524 m), weight 58.4 kg, SpO2 95 %.  General appearance: alert, cooperative and no distress Heart: regular rate and rhythm Lungs: clear to auscultation bilaterally Abdomen: soft, non-tender; bowel sounds normal; no masses,  no organomegaly Extremities: edema trace Wound: clean and dry  Discharge Instructions    Amb Referral to Cardiac Rehabilitation   Complete by: As directed    Diagnosis:  CABG NSTEMI     CABG X ___: 4   After initial evaluation and assessments completed: Virtual Based Care may be provided alone or in conjunction with Phase 2 Cardiac Rehab based on patient barriers.: Yes   Discharge patient   Complete by: As directed    Discharge disposition: 01-Home or Self Care   Discharge patient date: 05/09/2020     Allergies as of  05/09/2020      Reactions   Ibuprofen Hives   Penicillins Hives   Tolerated Ancef in the past   Aleve [naproxen Sodium] Hives   Avapro  [irbesartan] Rash   Other reaction(s): Weal   Azithromycin Hives   Nickel Itching   Redness, itching and fluid discharge with nickel earrings   Other Other (See Comments)   Nickel"ears ran fluid and itching" redness and "sensitive" Nickel"ears ran fluid and itching"      Medication List    STOP taking these medications   atenolol 100 MG tablet Commonly known as: TENORMIN   heparin 25000 UT/250ML infusion     TAKE these medications   acetaminophen 500 MG tablet Commonly known as: TYLENOL Take 1,000 mg by mouth every 6 (six) hours as needed for mild pain.   aspirin EC 81 MG tablet Take 81 mg by mouth daily.   atorvastatin 20 MG tablet Commonly known as: LIPITOR Take 2 tablets (40 mg total) by mouth at bedtime.   CALTRATE 600+D PLUS PO Take 1 tablet by mouth daily.   clopidogrel 75 MG tablet Commonly known as: Plavix Take 1 tablet (75 mg total) by mouth daily.   diphenhydrAMINE 50 MG tablet Commonly known as: BENADRYL Take 0.5 tablets (25 mg total) by mouth every 6 (six) hours as needed for itching.   diphenhydrAMINE-zinc acetate cream Commonly known as: BENADRYL Apply 1 g topically daily as needed for itching.   furosemide 40 MG tablet Commonly known as: LASIX Take 1 tablet (40 mg total) by mouth daily for 3 days.   loperamide 2 MG capsule Commonly known as: IMODIUM Take 1 capsule (2 mg total) by mouth as needed for diarrhea or loose stools.   losartan 25 MG tablet Commonly known as: COZAAR Take 1 tablet (25 mg total) by mouth daily. Start taking on: May 10, 2020   metoprolol succinate 50 MG 24 hr tablet Commonly known as: TOPROL-XL Take 1 tablet (50 mg total) by mouth daily. Take with or immediately following a meal. Start taking on: May 10, 2020   Multi-Vitamins Tabs Take 1 tablet by mouth daily.    Potassium Chloride ER 20 MEQ Tbcr Take 20 mEq by mouth daily.   traMADol 50 MG tablet Commonly known as: ULTRAM Take 1 tablet (50 mg total) by mouth every 6 (six) hours as needed for moderate pain.            Durable Medical Equipment  (From admission, onward)         Start     Ordered   05/08/20 1300  For home use only DME oxygen  Once       Question Answer Comment  Length of Need 6 Months   Mode or (Route) Nasal cannula   Liters per Minute 2   Frequency Continuous (stationary and portable oxygen unit needed)   Oxygen delivery system Gas      05/08/20 1259   05/07/20 1900  For home use only DME 3 n 1  Once        05/07/20 1900          Follow-up Information    Derinda Late, MD. Call in 1 day(s).   Specialty: Family Medicine Contact information: [address] [facility] [place] [address] [phone]        Melrose Nakayama, MD Follow up.   Specialty: Cardiothoracic Surgery Why: Your routine follow-up appointment is on 5/17 at 4:30p. Please arrive at 4pm for a chest xray located at [place] which is on the first floor of our building. Contact information: [address] [place] [address] [facility] Follow up.   Why: 3n1 and home 02 arranged- to be delivered to room prior to discharge Contact information: [address] [place] [address] [phone]        Marrianne Mood D, PA-C Follow up on 05/22/2020.   Specialties: Physician Assistant, Cardiology Why: at 3:30pm. Please arrive at 3:15pm   Contact information: [place] [address] [phone]              The patient has been discharged on:   1.Beta Blocker:  Yes [ yes  ]                              No   [   ]                              If No, reason:  2.Ace Inhibitor/ARB: Yes [place]  ]                                     No  [    ]                                     If No, reason:  3.Statin:   Yes [ yes  ]                  No  [   ]                  If No, reason:  4.Ecasa:  Yes  [ yes  ]                  No   [   ]                  If No, reason:   Signed: Elgie Collard 05/09/2020, 2:59 PM

## 2020-05-04 NOTE — Progress Notes (Addendum)
[address]       [place] [address]             [phone]      1 Day Post-Op Procedure(s) (LRB): CORONARY ARTERY BYPASS GRAFTING (CABG) X FOUR ON PUMP USING LEFT INTERNAL MAMMARY ARTERY AND RIGHT ENDOSCOPIC SAPHEOUS VEIN HARVEST CONDUITS (N/A) TRANSESOPHAGEAL ECHOCARDIOGRAM (TEE) (N/A) Subjective: Resting comfortably this morning, no complaints, pain is well controlled  Objective: Vital signs in last 24 hours: Temp:  [97.16 F (36.2 C)-100.4 F (38 C)] 97.52 F (36.4 C) (04/21 0715) Pulse Rate:  [85-121] 85 (04/21 0715) Cardiac Rhythm: Normal sinus rhythm;Sinus tachycardia (04/21 0400) Resp:  [12-29] 17 (04/21 0715) BP: (95-447)/(45-110) 109/55 (04/21 0715) SpO2:  [93 %-100 %] 97 % (04/21 0715) Arterial Line BP: (99-121)/(39-66) 102/58 (04/20 2000) FiO2 (%):  [40 %-50 %] 40 % (04/20 2100) Weight:  [63.9 kg] 63.9 kg (04/21 0500)  Hemodynamic parameters for last 24 hours: PAP: (19-39)/(10-28) 21/11 CO:  [2.4 L/min-3.5 L/min] 2.8 L/min CI:  [1.6 L/min/m2-2.3 L/min/m2] 1.8 L/min/m2  Intake/Output from previous day: 04/20 0701 - 04/21 0700 In: 3555.9 [I.V.:2975.8; Blood:130; IV Piggyback:450.1] Out: 1955 [Urine:1550; Chest Tube:405] Intake/Output this shift: No intake/output data recorded.  General appearance: alert, cooperative and no distress Heart: regular rate and rhythm, S1, S2 normal, no murmur, click, rub or gallop Lungs: clear to auscultation bilaterally Abdomen: soft, non-tender; bowel sounds normal; no masses,  no organomegaly Extremities: some upper extremity edema Wound: clean and dry with sterile dressing in place  Lab Results: Recent Labs    05/03/20 1807 05/03/20 2132 05/03/20 2243 05/04/20 0513  WBC 16.7*  --   --  9.0  HGB 10.4*   < > 8.8* 9.2*  HCT 32.1*   < > 26.0* 28.3*  PLT 194  --   --  PENDING   < > = values in this interval not displayed.   BMET:  Recent Labs    05/03/20 1854 05/03/20 2132 05/03/20 2243  05/04/20 0513  NA 138   < > 138 136  K 4.2   < > 4.3 4.1  CL 110  --   --  108  CO2 20*  --   --  22  GLUCOSE 105*  --   --  116*  BUN 11  --   --  13  CREATININE 0.66  --   --  0.76  CALCIUM 10.8*  --   --  8.6*   < > = values in this interval not displayed.    PT/INR:  Recent Labs    05/03/20 1400  LABPROT 18.3*  INR 1.5*   ABG    Component Value Date/Time   PHART 7.361 05/03/2020 2243   HCO3 21.7 05/03/2020 2243   TCO2 23 05/03/2020 2243   ACIDBASEDEF 3.0 (H) 05/03/2020 2243   O2SAT 94.0 05/03/2020 2243   CBG (last 3)  Recent Labs    05/04/20 0235 05/04/20 0513 05/04/20 0650  GLUCAP 126* 118* 114*    Assessment/Plan: S/P Procedure(s) (LRB): CORONARY ARTERY BYPASS GRAFTING (CABG) X FOUR ON PUMP USING LEFT INTERNAL MAMMARY ARTERY AND RIGHT ENDOSCOPIC SAPHEOUS VEIN HARVEST CONDUITS (N/A) TRANSESOPHAGEAL ECHOCARDIOGRAM (TEE) (N/A)  1. CV-NSR in the 80s, frequent PVCs, BP well controlled, She is on no drips. 2. Pulm-tolerating 2L [place] with good oxygen saturation. Chest tube with 405cc output-keep. CXR stable, small bilateral pleural effusions.  3. Renal-creatinine 0.76, electrolytes okay. Weight up 5kg, can start some gentle diuretics today 4.  H and H -9.2/28.3, stable 5. Endo-blood glucose well controlled 6. Pain control-well controlled on her current regimen  Plan: Chest tubes to remain another day, see progression orders. OOB to chair. Encouraged incentive spirometer and ambulation when able. No nausea so progressing diet today. Weaning oxygen as tolerated. Doing well POD 1.    LOS: 3 days    Elgie Collard 05/04/2020 Patient seen and examined, agree with above Cardiac index is good- dc Swan Dc chest tubes Transition off insulin drip Anemia secondary to ABL- Hct 28, follow [place]. Roxan Hockey, MD [facility] [phone]

## 2020-05-04 NOTE — Progress Notes (Signed)
TCTS Evening Rounds  Pod #1 s/p CABG C/o nausea BP 123/61 (BP Location: Left Arm)   Pulse 93   Temp 98.2 F (36.8 C) (Oral)   Resp (!) 22   Ht 5' (1.524 m)   Wt 63.9 kg   SpO2 95%   BMI 27.51 kg/m  Alert/oriented CTA Mildly  Tachy Ext: mild edema  Good UOP   Intake/Output Summary (Last 24 hours) at 05/04/2020 1718 Last data filed at 05/04/2020 1500 Gross per 24 hour  Intake 1352.68 ml  Output 1200 ml  Net 152.68 ml   A/P: zofran Continue present management Araceli Coufal Z. Orvan Seen, [place]

## 2020-05-04 NOTE — Hospital Course (Addendum)
History of Present Illness:     Ms. 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  She had no prior history of heart disease.  She is a former smoker having quit approximately 20 years ago.  She reports having chest discomfort for several days but had an episode on the evening prior to admission that was much worse.  She described a sharp 10/10 pain in her left chest radiating to both arms.  She had associated clammy skin, nausea, shortness of breath, but no vomiting.  She presented to [facility] ED where EKG demonstrated sinus rhythm with a new left bundle branch block.  Her initial troponin was in excess of 19,000.  Chest x-ray demonstrated a mass in the posterior left lung.  This was followed up with a CTA that reveals a 3.3 x 2.5 x 2.5 cm left lower lobe lung mass that could represent "malignancy, cavitation, or abscess".  Her chest pain subsided in the ED, she was treated with aspirin and started on heparin infusion.  She was admitted to the hospital and cardiology consultation was provided.  Left heart catheterization was performed earlier today.  There was a significant pressure dampening of the catheter as it engaged the left main coronary artery that ultimately proved to be 80% stenosed.  Additionally, there was were 80% proximal lesions in both the LAD and circumflex coronary arteries.  The mid right coronary artery was totally occluded with left-to-right collaterals.  Left ventricular pressure remained afebrile.  She did not have a significant leukocytosis fraction was estimated at 35 to 45%.  Ms. Bookwalter was transferred to [facility] and our service has been kindly asked to evaluate her for consideration of coronary bypass  grafting. Currently, Ms. Curtiss is resting in bed with her daughter and minister at the bedside.  She denies having any chest pain.  Hospital Course:   The patient underwent a CABG x4 with Dr. Roxan Hockey. She tolerated the procedure well and was transferred to the surgical ICU for continued care. She was extubated in a timely manner. POD 1 we discontinued her chest tubes. She didn't need any drips at this time. We transitioned her off her insulin drip. Her cardiac index was good therefore we discontinue her swan ganz catheter. Her BP remained stable but her urine output decreased. Since she was about 10 lbs fluid overloaded, we administered lasix x 1. We continued to encourage ambulation and use of her incentive spirometer. She was off all drips and she was ambulating around the unit POD 2 and was transferred to 4E on that day.  She was diuresed over the next few days with appropriate response.  She regained independence with her mobility.  She was noted to have several loose stools early postoperatively.  Abdominal exam remained benign.  She did not have a significant leukocytosis and she remained afebrile.  She was treated symptomatically with oral Imodium.  Appetite gradually improved.  She had mild hypotension and tachycardia so the metoprolol was titrated accordingly.  Epicardial pacer wires were removed routinely on postop day 4.

## 2020-05-04 NOTE — Discharge Instructions (Signed)
Discharge Instructions:  1. You may shower, please wash incisions daily with soap and water and keep dry.  If you wish to cover wounds with dressing you may do so but please keep clean and change daily.  No tub baths or swimming until incisions have completely healed.  If your incisions become red or develop any drainage please call our office at [phone]  2. No Driving until cleared by Dr. Leonarda Salon office and you are no longer using narcotic pain medications  3. Monitor your weight daily.. Please use the same scale and weigh at same time... If you gain 3-5 lbs in 48 hours with associated lower extremity swelling, please contact our office at [phone]  4. Fever of 101.5 for at least 24 hours with no source, please contact our office at [phone]  5. Activity- up as tolerated, please walk at least 3 times per day.  Avoid strenuous activity, no lifting, pushing, or pulling with your arms over 8-10 lbs for a minimum of 6 weeks  6. If any questions or concerns arise, please do not hesitate to contact our office at [phone]

## 2020-05-04 NOTE — Op Note (Signed)
[name], [name] MEDICAL RECORD NO: [id number] ACCOUNT NO: [id number] DATE OF BIRTH: December 12, 1940 FACILITY: [facility] LOCATION: [facility] PHYSICIAN: Revonda Standard. 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  PROCEDURE:   Median sternotomy, extracorporeal circulation, Coronary artery bypass grafting x 4  Left internal mammary artery to LAD  Sequential saphenous vein graft to posterior descending and obtuse marginal 3  Saphenous vein graft to the obtuse marginal 1. Endoscopic vein harvest, right thigh.  SURGEON:  Revonda Standard. Roxan Hockey, MD  ASSISTANT:  Konrad Saha, PA  ANESTHESIA:  General.  FINDINGS:  The posterior descending was a 1 mm poor quality target. Remaining targets were good quality.  Good quality conduits.  Transesophageal echocardiography showed ejection fraction of approximately 40-45% with mild mitral regurgitation.  Postbypass exam  unchanged.  CLINICAL NOTE: [name] is a 80 year old woman with multiple cardiac risk factors and known peripheral arterial disease, who presented with an episode of severe chest pain radiating to her arms.  She ruled in for myocardial infarction and on  catheterization she was found to have left main and 3-vessel disease with an ejection fraction of approximately 40%.  She was advised to undergo coronary artery bypass grafting.  The indications, risks, benefits, and alternatives were discussed in detail  with the patient.  She understood and accepted the risks and agreed to proceed.  OPERATIVE NOTE: [name] was brought to the preoperative holding area on 05/03/2020, the anesthesia service under direction of Dr. Marcie Bal placed an arterial blood pressure monitoring line and a Swan-Ganz catheter.  She was taken to the operating room, anesthetized and intubated.  A Foley  catheter was placed.  Intravenous antibiotics were administered.  Transesophageal echocardiography was performed.  Findings were as noted above.  The chest, abdomen and legs were prepped  and draped in the usual sterile fashion.  A timeout was performed.   An incision was made in the medial aspect of the right leg just below the knee.  The greater saphenous vein was identified and harvested from the knee to the groin endoscopically.  The saphenous vein was a good quality vessel.   Simultaneously, a median sternotomy was performed and the left internal mammary artery was harvested under direct vision.  It was a good quality vessel with excellent flow when divided distally.  2000 units of heparin was administered during the vessel  harvest.  The remainder of the full heparin dose was given prior to opening the pericardium.  After harvesting the conduits, the pericardium was opened and the ascending aorta was inspected.  There was plaque posteriorly and laterally.  The cannulation site and site for the proximals were free of the plaque and it was possible to place the  crossclamp without having to clamp the diseased portion of the aorta.  After confirming adequate anticoagulation with ACT measurement, the aorta was cannulated via concentric 2-0 Ethibond pledgeted pursestring sutures.  A dual stage venous cannula was  placed via a pursestring suture in the right atrial appendage.  Cardiopulmonary bypass was initiated.  Flows were maintained per protocol.  The patient was cooled to 32 degrees Celsius.  The coronary arteries were inspected and anastomotic sites were  chosen.  The conduits were inspected and cut to length.  A foam pad was placed in the pericardium to insulate the heart.  A temperature probe was placed in the myocardial  septum and a cardioplegia cannula was placed in the ascending aorta.  The aorta was cross clamped.  The left ventricle was emptied via the aortic root vent.  Cardiac arrest was  achieved with a combination of cold antegrade blood cardioplegia and topical iced saline.  500 mL of cardioplegia was administered.  There was a rapid diastolic arrest and septal cooling to 8 degree Celsius.  A reversed saphenous vein graft was placed sequentially to the posterior descending branch of the right coronary.  This was a 1 mm poor quality target.  It did accept a probe distally.  A side-to-side anastomosis was performed with a running 7-0 Prolene  suture and a probe did pass distally from the anastomosis.  The distal end of the vein then was bevelled and was anastomosed end-to-side to OM3.  This was the dominant posterolateral/ posterior wall branch.  It was a 2 mm, good quality target.  The vein  was anastomosed end-to-side with a running 7-0 Prolene suture.  A probe passed easily proximally and distally at the completion of the anastomosis.  Cardioplegia was administered down the graft.  The heart then was elevated and on the anterolateral wall OM1 was identified.  This was 2 mm, good quality target vessel.  The vein was of good quality.  It was anastomosed end-to-side to OM1 with a running 7-0 Prolene suture.  A probe passed easily  proximally and distally and cardioplegia was administered with good flow and good hemostasis.  Additional cardioplegia was also administered down the aortic root.  The heart then was elevated exposing the anterior wall.  The LAD was 2 mm, good quality target at the site of the anastomosis.  It was a tortuous vessel, but was grafted at a site that was relatively straight.  The left internal mammary artery was  brought through a window in the pericardium.  The distal end was beveled.  It was a 1.5 mm good quality vessel with excellent flow.  Mammary to LAD anastomosis was performed with a running 8-0 Prolene suture.  At the completion of the anastomosis, the  bulldog clamp was removed.  Rapid septal rewarming was noted.  The bulldog clamp was replaced.  The  mammary pedicle was tacked to the epicardial surface of the heart.  Additional cardioplegia was administered.  The vein grafts were cut to length.  Cardioplegia cannula was removed from the ascending aorta.  The proximal vein graft anastomoses were performed to 4.0 mm punch aortotomies with running 6-0 Prolene sutures.   The aorta was of good quality with no plaque at the site of the proximal anastomoses.  At the completion of the second proximal anastomosis, the patient was placed in Trendelenburg position.  Lidocaine was administered.  The aortic root was de-aired and  the aortic crossclamp was removed.  The bulldog clamp was also removed from the left mammary artery.  The total cross-clamp time was 60 minutes.  The patient required two defibrillations with 10 joules and then was in sinus rhythm, but bradycardic and  requiring pacing thereafter.  While rewarming was completed all proximal and distal anastomoses were inspected for hemostasis.  Epicardial pacing wires were placed on the right ventricle and right atrium.  DDD pacing was initiated.  When the patient  had rewarmed to a core temperature of 37 degrees Celsius she was weaned from cardiopulmonary bypass on the first attempt.  She was DDD paced and on no inotropic support at the time of separation from bypass.  Total bypass time  was 102 minutes.   Post-bypass transesophageal echocardiography was essentially unchanged from the prebypass study with ejection fraction of approximately 40% with mild mitral regurgitation.  The initial cardiac index was greater than 2 liters per minute per meter squared. She remained hemodynamically stable throughout the post-bypass period.  A test dose of protamine was administered and was well tolerated.  The atrial and aortic cannula were removed.  The remainder of the protamine was administered without incident.  The chest was irrigated with warm saline.  Hemostasis was achieved.  The  pericardium was reapproximated  over the ascending aorta and base of the heart with interrupted 3-0 silk sutures.  Left pleural and mediastinal chest tubes were placed through separate subcostal incisions and secured with #1 silk sutures.  The sternum was  closed with a combination of single and double heavy gauge stainless steel wires.  The pectoralis fascia, subcutaneous tissue and skin were closed in standard fashion.  All sponge, needle and instrument counts were correct at the end of the procedure.   The patient was taken from the operating room to the surgical intensive care unit, intubated and in good condition.   PUS D: 05/03/2020 1:50:35 pm T: 05/04/2020 5:53:00 am  JOB: [id number]/ [id number]

## 2020-05-04 NOTE — Anesthesia Postprocedure Evaluation (Signed)
Anesthesia Post Note  Patient: [name]  Procedure(s) Performed: CORONARY ARTERY BYPASS GRAFTING (CABG) X FOUR ON PUMP USING LEFT INTERNAL MAMMARY ARTERY AND RIGHT ENDOSCOPIC SAPHEOUS VEIN HARVEST CONDUITS (N/A Chest) TRANSESOPHAGEAL ECHOCARDIOGRAM (TEE) (N/A )     Patient location during evaluation: SICU Anesthesia Type: General Level of consciousness: sedated Pain management: pain level controlled Vital Signs Assessment: post-procedure vital signs reviewed and stable Respiratory status: patient remains intubated per anesthesia plan Cardiovascular status: stable Postop Assessment: no apparent nausea or vomiting Anesthetic complications: no   No complications documented.  Last Vitals:  Vitals:   05/04/20 1541 05/04/20 1600  BP:  123/61  Pulse:  93  Resp:  (!) 22  Temp: 36.8 C   SpO2:  95%    Last Pain:  Vitals:   05/04/20 1600  TempSrc:   PainSc: 0-No pain                 Catalina Gravel

## 2020-05-05 ENCOUNTER — Inpatient Hospital Stay (HOSPITAL_COMMUNITY): Payer: Medicare Other

## 2020-05-05 LAB — CBC
HCT: 27 % — ABNORMAL LOW (ref 36.0–46.0)
Hemoglobin: 8.8 g/dL — ABNORMAL LOW (ref 12.0–15.0)
MCH: 32.8 pg (ref 26.0–34.0)
MCHC: 32.6 g/dL (ref 30.0–36.0)
MCV: 100.7 fL — ABNORMAL HIGH (ref 80.0–100.0)
Platelets: 117 10*3/uL — ABNORMAL LOW (ref 150–400)
RBC: 2.68 MIL/uL — ABNORMAL LOW (ref 3.87–5.11)
RDW: 14 % (ref 11.5–15.5)
WBC: 8.2 10*3/uL (ref 4.0–10.5)
nRBC: 0 % (ref 0.0–0.2)

## 2020-05-05 LAB — BASIC METABOLIC PANEL
Anion gap: 7 (ref 5–15)
BUN: 15 mg/dL (ref 8–23)
CO2: 25 mmol/L (ref 22–32)
Calcium: 9 mg/dL (ref 8.9–10.3)
Chloride: 106 mmol/L (ref 98–111)
Creatinine, Ser: 0.65 mg/dL (ref 0.44–1.00)
GFR, Estimated: >60 mL/min (ref >60)
Glucose, Bld: 117 mg/dL — ABNORMAL HIGH (ref 70–99)
Potassium: 4 mmol/L (ref 3.5–5.1)
Sodium: 138 mmol/L (ref 135–145)

## 2020-05-05 LAB — GLUCOSE, CAPILLARY
Glucose-Capillary: 103 mg/dL — ABNORMAL HIGH (ref 70–99)
Glucose-Capillary: 108 mg/dL — ABNORMAL HIGH (ref 70–99)
Glucose-Capillary: 112 mg/dL — ABNORMAL HIGH (ref 70–99)
Glucose-Capillary: 113 mg/dL — ABNORMAL HIGH (ref 70–99)
Glucose-Capillary: 116 mg/dL — ABNORMAL HIGH (ref 70–99)
Glucose-Capillary: 121 mg/dL — ABNORMAL HIGH (ref 70–99)
Glucose-Capillary: 131 mg/dL — ABNORMAL HIGH (ref 70–99)

## 2020-05-05 LAB — MAGNESIUM: Magnesium: 2 mg/dL (ref 1.7–2.4)

## 2020-05-05 MED ORDER — LOPERAMIDE HCL 2 MG PO CAPS
2.0000 mg | ORAL_CAPSULE | ORAL | Status: DC | PRN
  Administered 2020-05-05 – 2020-05-06 (×2): 2 mg via ORAL
  Filled 2020-05-05 (×2): qty 1

## 2020-05-05 MED ORDER — TIMOLOL MALEATE 0.5 % OP SOLN
1.0000 [drp] | Freq: Every day | OPHTHALMIC | Status: DC
  Filled 2020-05-05: qty 5

## 2020-05-05 MED ORDER — METOPROLOL TARTRATE 25 MG PO TABS
25.0000 mg | ORAL_TABLET | Freq: Two times a day (BID) | ORAL | Status: DC
  Administered 2020-05-05 – 2020-05-07 (×6): 25 mg via ORAL
  Filled 2020-05-05 (×6): qty 1

## 2020-05-05 MED ORDER — ACETAMINOPHEN 160 MG/5ML PO SOLN
1000.0000 mg | Freq: Four times a day (QID) | ORAL | Status: AC
  Administered 2020-05-05 – 2020-05-08 (×13): 1000 mg via ORAL
  Filled 2020-05-05 (×12): qty 40.6

## 2020-05-05 MED ORDER — INSULIN ASPART 100 UNIT/ML ~~LOC~~ SOLN: 0.0000 [IU] | SUBCUTANEOUS | Status: DC

## 2020-05-05 MED ORDER — FUROSEMIDE 40 MG PO TABS
40.0000 mg | ORAL_TABLET | Freq: Two times a day (BID) | ORAL | Status: DC
  Administered 2020-05-06 – 2020-05-07 (×3): 40 mg via ORAL
  Filled 2020-05-05 (×2): qty 1

## 2020-05-05 MED ORDER — METOPROLOL TARTRATE 25 MG/10 ML ORAL SUSPENSION: 12.5000 mg | Freq: Two times a day (BID) | ORAL | Status: DC

## 2020-05-05 MED ORDER — ~~LOC~~ CARDIAC SURGERY, PATIENT & FAMILY EDUCATION: Freq: Once | Status: AC

## 2020-05-05 MED ORDER — POTASSIUM CHLORIDE CRYS ER 20 MEQ PO TBCR: 20.0000 meq | EXTENDED_RELEASE_TABLET | Freq: Two times a day (BID) | ORAL | Status: DC

## 2020-05-05 MED ORDER — ACETAMINOPHEN 160 MG/5ML PO SOLN: 1000.0000 mg | Freq: Four times a day (QID) | ORAL | Status: DC

## 2020-05-05 MED FILL — Sodium Chloride IV Soln 0.9%: INTRAVENOUS | Qty: 2000 | Status: AC

## 2020-05-05 MED FILL — Electrolyte-R (PH 7.4) Solution: INTRAVENOUS | Qty: 4000 | Status: AC

## 2020-05-05 MED FILL — Lidocaine HCl Local Soln Prefilled Syringe 100 MG/5ML (2%): INTRAMUSCULAR | Qty: 5 | Status: AC

## 2020-05-05 MED FILL — Heparin Sodium (Porcine) Inj 1000 Unit/ML: INTRAMUSCULAR | Qty: 10 | Status: AC

## 2020-05-05 MED FILL — Sodium Bicarbonate IV Soln 8.4%: INTRAVENOUS | Qty: 50 | Status: AC

## 2020-05-05 MED FILL — Mannitol IV Soln 20%: INTRAVENOUS | Qty: 500 | Status: AC

## 2020-05-05 NOTE — Progress Notes (Signed)
CARDIAC REHAB PHASE I   PRE:  Rate/Rhythm: 102 ST  BP:  Sitting: 121/60      SaO2: 92 2L  MODE:  Ambulation: 280 ft   POST:  Rate/Rhythm: 120 ST  BP:  Sitting: 147/79    SaO2: 95 2L  Pt helped to BR than ambulated 245ft in hallway assist of one with EVA. Pt denies pain, SOB or dizziness. Does c/o leg weakness. Pt helped back into bed. Able to maintain sternal precautions throughout session. Encouraged continued ambulation and IS use. Will continue to follow.  [id number] Rufina Falco, RN BSN 05/05/2020 1:31 PM

## 2020-05-05 NOTE — Progress Notes (Signed)
Pt arrived from unit from 2 heart  Pt NSR-ST. CHG given pacing wires taped .Will continue to monitor. Pt. Oriented to unit   Phoebe Sharps, RN

## 2020-05-05 NOTE — Progress Notes (Addendum)
2 Days Post-Op Procedure(s) (LRB): CORONARY ARTERY BYPASS GRAFTING (CABG) X FOUR ON PUMP USING LEFT INTERNAL MAMMARY ARTERY AND RIGHT ENDOSCOPIC SAPHEOUS VEIN HARVEST CONDUITS (N/A) TRANSESOPHAGEAL ECHOCARDIOGRAM (TEE) (N/A) Subjective: Feels okay, but was up all night with diarrhea.   Objective: Vital signs in last 24 hours: Temp:  [97.16 F (36.2 C)-98.3 F (36.8 C)] 98 F (36.7 C) (04/22 0344) Pulse Rate:  [82-107] 100 (04/22 0700) Cardiac Rhythm: Normal sinus rhythm;Sinus tachycardia (04/22 0715) Resp:  [16-29] 20 (04/22 0700) BP: (105-140)/(50-77) 127/56 (04/22 0700) SpO2:  [92 %-97 %] 97 % (04/22 0700) Weight:  [62.4 kg] 62.4 kg (04/22 0600)  Hemodynamic parameters for last 24 hours: PAP: (21-22)/(10-12) 21/10 CO:  [3.2 L/min] 3.2 L/min CI:  [2.1 L/min/m2] 2.1 L/min/m2  Intake/Output from previous day: 04/21 0701 - 04/22 0700 In: 1151.5 [P.O.:580; I.V.:251.2; IV Piggyback:320.3] Out: 1490 [Urine:1470; Chest Tube:20] Intake/Output this shift: No intake/output data recorded.  General appearance: alert, cooperative and no distress Heart: sinus tachycardia Lungs: clear to auscultation bilaterally Abdomen: soft, non-tender; bowel sounds normal; no masses,  no organomegaly Extremities: extremities normal, atraumatic, no cyanosis or edema Wound: clean and dry  Lab Results: Recent Labs    05/04/20 1551 05/05/20 0326  WBC 10.1 8.2  HGB 9.4* 8.8*  HCT 29.2* 27.0*  PLT 142* 117*   BMET:  Recent Labs    05/04/20 1551 05/05/20 0326  NA 137 138  K 3.6 4.0  CL 106 106  CO2 25 25  GLUCOSE 118* 117*  BUN 14 15  CREATININE 0.68 0.65  CALCIUM 9.0 9.0    PT/INR:  Recent Labs    05/03/20 1400  LABPROT 18.3*  INR 1.5*   ABG    Component Value Date/Time   PHART 7.361 05/03/2020 2243   HCO3 21.7 05/03/2020 2243   TCO2 23 05/03/2020 2243   ACIDBASEDEF 3.0 (H) 05/03/2020 2243   O2SAT 94.0 05/03/2020 2243   CBG (last 3)  Recent Labs    05/05/20 0004  05/05/20 0331 05/05/20 0647  GLUCAP 116* 112* 108*    Assessment/Plan: S/P Procedure(s) (LRB): CORONARY ARTERY BYPASS GRAFTING (CABG) X FOUR ON PUMP USING LEFT INTERNAL MAMMARY ARTERY AND RIGHT ENDOSCOPIC SAPHEOUS VEIN HARVEST CONDUITS (N/A) TRANSESOPHAGEAL ECHOCARDIOGRAM (TEE) (N/A)   1. CV-ST in the low 100s, frequent PVCs, BP well controlled, She is on no drips. Will increase her BB today 2. Pulm-tolerating 2L [place] with good oxygen saturation. Chest tube removed yesterday. CXR stable, small bilateral pleural effusions.  3. Renal-creatinine 0.65, electrolytes okay. Weight up 4kg, received a dose of IV lasix yesterday with good response. 4. H and H -8.8/27.0, stable 5. Endo-blood glucose well controlled 6. Pain control-well controlled on her current regimen 7. Diarrhea overnight-will order Imodium   Plan: Possible transfer to the step-down unit today. Encouraged use of incentive spirometer, continue to ambulate around the unit. PT to see. May need to start some PO diuretics based on CXR.    LOS: 4 days    Elgie Collard 05/05/2020   I have seen and examined the patient and agree with the assessment and plan as outlined.  Overall doing fairly well POD2.  Mobilize.  Gentle diuresis.  Transfer 4E  Rexene Alberts, MD 05/05/2020 9:17 AM

## 2020-05-05 NOTE — Plan of Care (Signed)
  Problem: Education: Goal: Knowledge of General Education information will improve Description: Including pain rating scale, medication(s)/side effects and non-pharmacologic comfort measures Outcome: Progressing   Problem: Clinical Measurements: Goal: Ability to maintain clinical measurements within normal limits will improve Outcome: Progressing Goal: Will remain free from infection Outcome: Progressing Goal: Diagnostic test results will improve Outcome: Progressing Goal: Respiratory complications will improve Outcome: Progressing Goal: Cardiovascular complication will be avoided Outcome: Progressing   Problem: Activity: Goal: Risk for activity intolerance will decrease Outcome: Progressing   Problem: Nutrition: Goal: Adequate nutrition will be maintained Outcome: Progressing   Problem: Coping: Goal: Level of anxiety will decrease Outcome: Progressing   Problem: Elimination: Goal: Will not experience complications related to bowel motility Outcome: Progressing Goal: Will not experience complications related to urinary retention Outcome: Progressing   Problem: Pain Managment: Goal: General experience of comfort will improve Outcome: Progressing   Problem: Safety: Goal: Ability to remain free from injury will improve Outcome: Progressing   Problem: Skin Integrity: Goal: Risk for impaired skin integrity will decrease Outcome: Progressing   Problem: Activity: Goal: Risk for activity intolerance will decrease Outcome: Progressing   Problem: Cardiac: Goal: Will achieve and/or maintain hemodynamic stability Outcome: Progressing   Problem: Clinical Measurements: Goal: Postoperative complications will be avoided or minimized Outcome: Progressing   Problem: Respiratory: Goal: Respiratory status will improve Outcome: Progressing   Problem: Skin Integrity: Goal: Wound healing without signs and symptoms of infection Outcome: Progressing Goal: Risk for impaired  skin integrity will decrease Outcome: Progressing   Problem: Urinary Elimination: Goal: Ability to achieve and maintain adequate renal perfusion and functioning will improve Outcome: Progressing

## 2020-05-06 ENCOUNTER — Inpatient Hospital Stay (HOSPITAL_COMMUNITY): Payer: Medicare Other

## 2020-05-06 LAB — BPAM RBC
Blood Product Expiration Date: 202205132359
Blood Product Expiration Date: 202205132359
Blood Product Expiration Date: 202205132359
Blood Product Expiration Date: 202205152359
ISSUE DATE / TIME: 202204140820
ISSUE DATE / TIME: 202204200959
ISSUE DATE / TIME: 202204200959
Unit Type and Rh: 5100
Unit Type and Rh: 5100
Unit Type and Rh: 5100
Unit Type and Rh: 5100

## 2020-05-06 LAB — GLUCOSE, CAPILLARY
Glucose-Capillary: 101 mg/dL — ABNORMAL HIGH (ref 70–99)
Glucose-Capillary: 107 mg/dL — ABNORMAL HIGH (ref 70–99)
Glucose-Capillary: 111 mg/dL — ABNORMAL HIGH (ref 70–99)
Glucose-Capillary: 117 mg/dL — ABNORMAL HIGH (ref 70–99)
Glucose-Capillary: 75 mg/dL (ref 70–99)

## 2020-05-06 LAB — CBC
HCT: 26.5 % — ABNORMAL LOW (ref 36.0–46.0)
Hemoglobin: 8.6 g/dL — ABNORMAL LOW (ref 12.0–15.0)
MCH: 32.7 pg (ref 26.0–34.0)
MCHC: 32.5 g/dL (ref 30.0–36.0)
MCV: 100.8 fL — ABNORMAL HIGH (ref 80.0–100.0)
Platelets: 114 10*3/uL — ABNORMAL LOW (ref 150–400)
RBC: 2.63 MIL/uL — ABNORMAL LOW (ref 3.87–5.11)
RDW: 14.2 % (ref 11.5–15.5)
WBC: 6.1 10*3/uL (ref 4.0–10.5)
nRBC: 0 % (ref 0.0–0.2)

## 2020-05-06 LAB — TYPE AND SCREEN
ABO/RH(D): O POS
Antibody Screen: NEGATIVE
Unit division: 0
Unit division: 0
Unit division: 0
Unit division: 0

## 2020-05-06 LAB — BASIC METABOLIC PANEL
Anion gap: 7 (ref 5–15)
BUN: 16 mg/dL (ref 8–23)
CO2: 24 mmol/L (ref 22–32)
Calcium: 8.7 mg/dL — ABNORMAL LOW (ref 8.9–10.3)
Chloride: 108 mmol/L (ref 98–111)
Creatinine, Ser: 0.61 mg/dL (ref 0.44–1.00)
GFR, Estimated: >60 mL/min (ref >60)
Glucose, Bld: 132 mg/dL — ABNORMAL HIGH (ref 70–99)
Potassium: 3.3 mmol/L — ABNORMAL LOW (ref 3.5–5.1)
Sodium: 139 mmol/L (ref 135–145)

## 2020-05-06 MED ORDER — POTASSIUM CHLORIDE CRYS ER 20 MEQ PO TBCR
40.0000 meq | EXTENDED_RELEASE_TABLET | Freq: Two times a day (BID) | ORAL | Status: AC
  Administered 2020-05-06 (×2): 40 meq via ORAL
  Filled 2020-05-06 (×2): qty 2

## 2020-05-06 MED ORDER — INSULIN ASPART 100 UNIT/ML ~~LOC~~ SOLN: 0.0000 [IU] | Freq: Three times a day (TID) | SUBCUTANEOUS | Status: DC

## 2020-05-06 NOTE — Progress Notes (Signed)
Mobility Specialist: Progress Note   05/06/20 1429  Mobility  Activity Ambulated in hall  Level of Assistance Minimal assist, patient does 75% or more  Assistive Device Front wheel [place]  Distance Ambulated (ft) 350 ft  Mobility Response Tolerated well  Mobility performed by Mobility specialist  $Mobility charge 1 Mobility   Pre-Mobility on RA: 113 HR, 91% SpO2 During Mobility on 1 L/min [place]: 135 HR, 93% SpO2 Post-Mobility on 1 L/min [place]: 121 HR, 146/65 BP, 95% SpO2  Pt c/o feeling SOB halfway through ambulation. Pt stopped to take a short standing break, unable to get SpO2 reading during this time. Pt placed on 1 L/min [place] to finish walk. Pt back to bed with sats holding in the mid 90%s. Pt taken off [place] O2 with sats maintaining mid 90%s on RA.   [facility] Phebe Dettmer Mobility Specialist Mobility Specialist Phone: [phone]

## 2020-05-06 NOTE — Progress Notes (Signed)
CARDIAC REHAB PHASE I   PRE:  Rate/Rhythm: SR 99  BP:  Sitting: 130/61      SaO2:96 % (2L)  MODE:  Ambulation: 235 ft [place]@ (94-95%)   POST:  Rate/Rhythm: ST/114  BP:  Sitting: 144/66      SaO2: 98% (2 L)  Pt received in bed, agrees to ambulate. Pt ambulated using rolling Fraizer, stable, slow gait. Pt with O2 @ 2L/min via N/C. Pt denies dizziness or pain. Pt did have some SOB with walk (SaO2 =94-95%). Pt back to bed to rest, voices feeling tired. Granddaughter in room. I/S encouraged 10x/hr. I/S =500 mL.   Lesly Rubenstein, MS, ACSM EP-C, CCRP  [phone]

## 2020-05-06 NOTE — Progress Notes (Addendum)
3 Days Post-Op Procedure(s) (LRB): CORONARY ARTERY BYPASS GRAFTING (CABG) X FOUR ON PUMP USING LEFT INTERNAL MAMMARY ARTERY AND RIGHT ENDOSCOPIC SAPHEOUS VEIN HARVEST CONDUITS (N/A) TRANSESOPHAGEAL ECHOCARDIOGRAM (TEE) (N/A) Subjective: In bed, says she feels tired after walking in the hall this morning.  Only 1 loose stool yesterday. Says appetite has not returned yet.   Objective: Vital signs in last 24 hours: Temp:  [97.1 F (36.2 C)-97.8 F (36.6 C)] 97.7 F (36.5 C) (04/23 0407) Pulse Rate:  [90-112] 98 (04/23 0407) Cardiac Rhythm: Sinus tachycardia;Bundle branch block (04/23 0700) Resp:  [17-26] 20 (04/23 0407) BP: (116-141)/(55-65) 116/55 (04/23 0407) SpO2:  [92 %-98 %] 96 % (04/23 0407) Weight:  [61.3 kg] 61.3 kg (04/23 0407)    Intake/Output from previous day: No intake/output data recorded. Intake/Output this shift: No intake/output data recorded.  General appearance: alert, cooperative and no distress Heart: sinus tachycardia in the 120's.  Lungs: clear to auscultation bilaterally Abdomen: soft, non-tender Extremities: extremities warm, no edema.  Wound: sternal incision and RLE EVH incision are clean and dry  Lab Results: Recent Labs    05/05/20 0326 05/06/20 0154  WBC 8.2 6.1  HGB 8.8* 8.6*  HCT 27.0* 26.5*  PLT 117* 114*   BMET:  Recent Labs    05/05/20 0326 05/06/20 0154  NA 138 139  K 4.0 3.3*  CL 106 108  CO2 25 24  GLUCOSE 117* 132*  BUN 15 16  CREATININE 0.65 0.61  CALCIUM 9.0 8.7*    PT/INR:  Recent Labs    05/03/20 1400  LABPROT 18.3*  INR 1.5*   ABG    Component Value Date/Time   PHART 7.361 05/03/2020 2243   HCO3 21.7 05/03/2020 2243   TCO2 23 05/03/2020 2243   ACIDBASEDEF 3.0 (H) 05/03/2020 2243   O2SAT 94.0 05/03/2020 2243   CBG (last 3)  Recent Labs    05/05/20 2106 05/06/20 0031 05/06/20 0416  GLUCAP 103* 111* 117*    EXAM: CHEST - 2 VIEW  COMPARISON:  05/05/2020  FINDINGS: Previous median sternotomy  and CABG procedure. No pneumothorax identified. The decreased lung volumes. Pulmonary vascular congestion. Small bilateral pleural effusions are again noted, left greater than right. Atelectasis is identified within both lung bases.  IMPRESSION: Small bilateral pleural effusions, pulmonary vascular congestion and bibasilar atelectasis.   Electronically Signed   By: Kerby Moors M.D.   On: 05/06/2020 08:00  Assessment/Plan: S/P Procedure(s) (LRB): CORONARY ARTERY BYPASS GRAFTING (CABG) X FOUR ON PUMP USING LEFT INTERNAL MAMMARY ARTERY AND RIGHT ENDOSCOPIC SAPHEOUS VEIN HARVEST CONDUITS (N/A) TRANSESOPHAGEAL ECHOCARDIOGRAM (TEE) (N/A)   1. CV-ST in the low 100s, rare PVCs, BP well controlled, continue metoprolol at 25mg  bid for now.  2. Pulm remains on 2L [place] with good oxygen saturation.  CXR shows small bilateral pleural effusions and bibasilar ATX.  She is working on pulmonary hygiene with cough and IS.  3. Renal-creatinine 0.61, K+3.3, will supplement. Weight is about 2kg above pre-op, continue diuresis with oral Lasix. 4. H and H  stable 5. Endo-blood glucose well controlled 6. Pain control-well controlled on her current regimen 7. Diarrhea prior to transfer to 4E.. This has improved, abd is benign. Has prn Imodium.     LOS: 5 days   Malon Kindle [phone] 05/06/2020   Chart reviewed, patient examined, agree with above. Feels weak but walking with cardiac rehab today.

## 2020-05-06 NOTE — Progress Notes (Signed)
Pt ambulated outside the room tolerated fairly. Pt back in room on the side of the bed eating breakfast.

## 2020-05-07 LAB — BASIC METABOLIC PANEL
Anion gap: 6 (ref 5–15)
BUN: 9 mg/dL (ref 8–23)
CO2: 28 mmol/L (ref 22–32)
Calcium: 8.5 mg/dL — ABNORMAL LOW (ref 8.9–10.3)
Chloride: 106 mmol/L (ref 98–111)
Creatinine, Ser: 0.66 mg/dL (ref 0.44–1.00)
GFR, Estimated: >60 mL/min (ref >60)
Glucose, Bld: 124 mg/dL — ABNORMAL HIGH (ref 70–99)
Potassium: 3.4 mmol/L — ABNORMAL LOW (ref 3.5–5.1)
Sodium: 140 mmol/L (ref 135–145)

## 2020-05-07 LAB — CBC
HCT: 26.1 % — ABNORMAL LOW (ref 36.0–46.0)
Hemoglobin: 8.5 g/dL — ABNORMAL LOW (ref 12.0–15.0)
MCH: 32.9 pg (ref 26.0–34.0)
MCHC: 32.6 g/dL (ref 30.0–36.0)
MCV: 101.2 fL — ABNORMAL HIGH (ref 80.0–100.0)
Platelets: 142 10*3/uL — ABNORMAL LOW (ref 150–400)
RBC: 2.58 MIL/uL — ABNORMAL LOW (ref 3.87–5.11)
RDW: 14.6 % (ref 11.5–15.5)
WBC: 6.4 10*3/uL (ref 4.0–10.5)
nRBC: 0 % (ref 0.0–0.2)

## 2020-05-07 LAB — GLUCOSE, CAPILLARY
Glucose-Capillary: 101 mg/dL — ABNORMAL HIGH (ref 70–99)
Glucose-Capillary: 99 mg/dL (ref 70–99)
Glucose-Capillary: 118 mg/dL — ABNORMAL HIGH (ref 70–99)
Glucose-Capillary: 111 mg/dL — ABNORMAL HIGH (ref 70–99)

## 2020-05-07 MED ORDER — POTASSIUM CHLORIDE CRYS ER 20 MEQ PO TBCR
40.0000 meq | EXTENDED_RELEASE_TABLET | Freq: Two times a day (BID) | ORAL | Status: AC
  Administered 2020-05-07 (×2): 40 meq via ORAL
  Filled 2020-05-07 (×2): qty 2

## 2020-05-07 MED ORDER — FUROSEMIDE 40 MG PO TABS
40.0000 mg | ORAL_TABLET | Freq: Every day | ORAL | Status: DC
  Administered 2020-05-08 – 2020-05-09 (×2): 40 mg via ORAL
  Filled 2020-05-07 (×3): qty 1

## 2020-05-07 NOTE — Plan of Care (Signed)

## 2020-05-07 NOTE — Progress Notes (Signed)
Mobility Specialist: Progress Note   05/07/20 1633  Mobility  Activity Ambulated in hall  Level of Assistance Contact guard assist, steadying assist  Assistive Device Front wheel [place]  Distance Ambulated (ft) 220 ft  Mobility Response Tolerated poorly  Mobility performed by Mobility specialist  Bed Position Chair  $Mobility charge 1 Mobility   Pre-Mobility 1 L/min [place]: 107 HR, 93% SpO2 During Mobility1-3 L/min [place]: 123 HR, 84% SpO2 Post-Mobility on 1 L/min [place]: 116 HR, 127/63 BP, 97% SpO2  Pt had to take a seated break due to feeling fatigued lasting 1-2 minutes. After returning to room pt experienced LOB walking into the room, minA to recover. Pt to BR and SpO2 checked. Sats read 84%. Pt's O2 flow increased to 3 L/min, pt recovered in 1 minute up to 97%. Pt placed back on 1 L/min [place] and is sitting in the chair per request.   Harrell Gave Dafna Romo Mobility Specialist Mobility Specialist Phone: [phone]

## 2020-05-07 NOTE — Progress Notes (Signed)
Attempted to wean pt off oxygen. Pt's oxygen saturation dropped to 85% on room air at rest. Pt placed on 1L O2. Sats up to 93-94%.

## 2020-05-07 NOTE — Progress Notes (Signed)
Epicardial pacer wires removed per order. Wire ends intact. VS stable.

## 2020-05-07 NOTE — Progress Notes (Addendum)
4 Days Post-Op Procedure(s) (LRB): CORONARY ARTERY BYPASS GRAFTING (CABG) X FOUR ON PUMP USING LEFT INTERNAL MAMMARY ARTERY AND RIGHT ENDOSCOPIC SAPHEOUS VEIN HARVEST CONDUITS (N/A) TRANSESOPHAGEAL ECHOCARDIOGRAM (TEE) (N/A) Subjective:  Says she feels better overall today. Appetite improving but not "normal yet". Had several loose stools again yesterday but denies abdominal pain or nausea.    Objective: Vital signs in last 24 hours: Temp:  [97.5 F (36.4 C)-97.7 F (36.5 C)] 97.6 F (36.4 C) (04/24 0806) Pulse Rate:  [96-109] 107 (04/24 0806) Cardiac Rhythm: Sinus tachycardia (04/23 1907) Resp:  [19-24] 20 (04/24 0806) BP: (118-138)/(59-71) 138/65 (04/24 0806) SpO2:  [90 %-98 %] 97 % (04/24 0806) Weight:  [61.3 kg] 61.3 kg (04/24 0313)    Intake/Output from previous day: No intake/output data recorded. Intake/Output this shift: No intake/output data recorded.  General appearance: alert, no distress Heart: sinus tachycardia but rates improved Lungs: clear to auscultation bilaterally Abdomen: soft, non-tender Extremities: extremities warm, no edema.  Wound: sternal incision and RLE EVH incision are clean and dry  Lab Results: Recent Labs    05/06/20 0154 05/07/20 0049  WBC 6.1 6.4  HGB 8.6* 8.5*  HCT 26.5* 26.1*  PLT 114* 142*   BMET:  Recent Labs    05/06/20 0154 05/07/20 0049  NA 139 140  K 3.3* 3.4*  CL 108 106  CO2 24 28  GLUCOSE 132* 124*  BUN 16 9  CREATININE 0.61 0.66  CALCIUM 8.7* 8.5*    PT/INR:  No results for input(s): LABPROT, INR in the last 72 hours. ABG    Component Value Date/Time   PHART 7.361 05/03/2020 2243   HCO3 21.7 05/03/2020 2243   TCO2 23 05/03/2020 2243   ACIDBASEDEF 3.0 (H) 05/03/2020 2243   O2SAT 94.0 05/03/2020 2243   CBG (last 3)  Recent Labs    05/06/20 1656 05/06/20 2015 05/07/20 0629  GLUCAP 75 101* 101*    Assessment/Plan: S/P Procedure(s) (LRB): CORONARY ARTERY BYPASS GRAFTING (CABG) X FOUR ON PUMP USING  LEFT INTERNAL MAMMARY ARTERY AND RIGHT ENDOSCOPIC SAPHEOUS VEIN HARVEST CONDUITS (N/A) TRANSESOPHAGEAL ECHOCARDIOGRAM (TEE) (N/A)   11. CV-ST in the low 100s, rare PVCs, BP trending up with MAP in mid 80's., titrate metoprolol to 25mg  tid, was on atenolol 100mg  daily at home.  D/C pacer wires. 2. Pulm remains on 2L [place] with good oxygen saturation. Continue working on pulmonary hygiene with cough and IS. CXR in AM to f/u on pleural effusions. 3. Renal-creatinine 0.66, K+3.4, will continue to supplement. Weight is unchanged at  2kg above pre-op but has no evidence of volume excess on exam. , will decrease Lasix.to once daily. Repeat BMP in am.  4. H and H  stable 5. Endo-blood glucose well controlled 6. Pain control-well controlled on her current regimen 7. Had several loose stools again but is afebrile and abd is benign. No reason to pursue work up at this time. Continue to observe. Has prn Imodium.     LOS: 6 days   Malon Kindle [phone] 05/07/2020     Chart reviewed, patient examined, agree with above. She is feeling stronger today. Has not walked yet. Watching church sermon. Says she has been using her IS.

## 2020-05-08 ENCOUNTER — Inpatient Hospital Stay (HOSPITAL_COMMUNITY): Payer: Medicare Other

## 2020-05-08 LAB — BASIC METABOLIC PANEL
Anion gap: 9 (ref 5–15)
BUN: 11 mg/dL (ref 8–23)
CO2: 25 mmol/L (ref 22–32)
Calcium: 8.8 mg/dL — ABNORMAL LOW (ref 8.9–10.3)
Chloride: 107 mmol/L (ref 98–111)
Creatinine, Ser: 0.63 mg/dL (ref 0.44–1.00)
GFR, Estimated: >60 mL/min (ref >60)
Glucose, Bld: 120 mg/dL — ABNORMAL HIGH (ref 70–99)
Potassium: 4.2 mmol/L (ref 3.5–5.1)
Sodium: 141 mmol/L (ref 135–145)

## 2020-05-08 LAB — GLUCOSE, CAPILLARY
Glucose-Capillary: 95 mg/dL (ref 70–99)
Glucose-Capillary: 105 mg/dL — ABNORMAL HIGH (ref 70–99)
Glucose-Capillary: 92 mg/dL (ref 70–99)
Glucose-Capillary: 97 mg/dL (ref 70–99)

## 2020-05-08 MED ORDER — METOPROLOL TARTRATE 50 MG PO TABS
50.0000 mg | ORAL_TABLET | Freq: Two times a day (BID) | ORAL | Status: DC
  Administered 2020-05-08 (×2): 50 mg via ORAL
  Filled 2020-05-08 (×2): qty 1

## 2020-05-08 MED ORDER — POTASSIUM CHLORIDE ER 20 MEQ PO TBCR: 20.0000 meq | EXTENDED_RELEASE_TABLET | Freq: Every day | ORAL | 0 refills | Status: DC

## 2020-05-08 MED ORDER — ASPIRIN 325 MG PO TBEC: 325.0000 mg | DELAYED_RELEASE_TABLET | Freq: Every day | ORAL | 0 refills | Status: DC

## 2020-05-08 MED ORDER — TRAMADOL HCL 50 MG PO TABS: 50.0000 mg | ORAL_TABLET | Freq: Four times a day (QID) | ORAL | 0 refills | Status: DC | PRN

## 2020-05-08 MED ORDER — LOPERAMIDE HCL 2 MG PO CAPS: 2.0000 mg | ORAL_CAPSULE | ORAL | 0 refills | Status: DC | PRN

## 2020-05-08 MED ORDER — FUROSEMIDE 40 MG PO TABS: 40.0000 mg | ORAL_TABLET | Freq: Every day | ORAL | 0 refills | Status: DC

## 2020-05-08 NOTE — Progress Notes (Signed)
SATURATION QUALIFICATIONS: (This note is used to comply with regulatory documentation for home oxygen)  Patient Saturations on Room Air at Rest = 91%  Patient Saturations on Room Air while Ambulating = 85%  Patient Saturations on 2 Liters of oxygen while Ambulating = 95%  Please briefly explain why patient needs home oxygen: Yes pt needs oxygen. Pt desats while ambulating on RA.

## 2020-05-08 NOTE — Progress Notes (Signed)
Mobility Specialist - Progress Note   05/08/20 1144  Mobility  Activity Ambulated in hall  Level of Assistance Standby assist, set-up cues, supervision of patient - no hands on  Assistive Device Front wheel [place]  Distance Ambulated (ft) 300 ft  Mobility Response Tolerated well  Mobility performed by Mobility specialist  $Mobility charge 1 Mobility   Pt received sitting up in chair and using IS. She ambulated on 2L O2 and was asx throughout. Pt to recliner after walk to eat lunch.   Pricilla Handler Mobility Specialist Mobility Specialist Phone: [phone]

## 2020-05-08 NOTE — TOC Initial Note (Addendum)
Transition of Care (TOC) - Initial/Assessment Note  Marvetta Gibbons RN, BSN Transitions of Care Unit 4E- RN Case Manager See Treatment Team for direct phone #    Patient Details  Name: [name] MRN: [id number] Date of Birth: December 15, 1940  Transition of Care [facility]) CM/SW Contact:    Dawayne Patricia, RN Phone Number: 05/08/2020, 4:02 PM  Clinical Narrative:                 Pt from home, s/p CABG, noted orders placed for DME- 3n1 and home 02. Per cardiac rehab note pt qualifies with ambulatory walk test, however do not see a qualifying diagnosis. Call made to [facility] with [facility] to have them review chart for home 02 needs and insurance requirements to see if pt will be covered under insurance. CM spoke with pt at bedside who is agreeable to using in house provider [employer].   Home 02 and 3n1 delivery pending insurance approval.   Expected Discharge Plan: Home/Self Care Barriers to Discharge: No Barriers Identified   Patient Goals and CMS Choice Patient states their goals for this hospitalization and ongoing recovery are:: return home      Expected Discharge Plan and Services Expected Discharge Plan: Home/Self Care   Discharge Planning Services: CM Consult   Living arrangements for the past 2 months: Single Family Home                 DME Arranged: 3-N-1,Oxygen DME Agency: [facility] Date DME Agency Contacted: 05/08/20 Time DME Agency Contacted: 76 Representative spoke with at DME Agency: Thedore Mins            Prior Living Arrangements/Services Living arrangements for the past 2 months: [place] with:: Self Patient language and need for interpreter reviewed:: Yes        Need for Family Participation in Patient Care: Yes (Comment) Care giver support system in place?: Yes (comment)   Criminal Activity/Legal Involvement Pertinent to Current Situation/Hospitalization: No - Comment as needed  Activities of Daily Living Home Assistive Devices/Equipment:  Eyeglasses,Dentures (specify type),Shower chair without back,[place] (specify type) ADL Screening (condition at time of admission) Patient's cognitive ability adequate to safely complete daily activities?: Yes Is the patient deaf or have difficulty hearing?: No Does the patient have difficulty seeing, even when wearing glasses/contacts?: No Does the patient have difficulty concentrating, remembering, or making decisions?: No Patient able to express need for assistance with ADLs?: Yes Does the patient have difficulty dressing or bathing?: No Independently performs ADLs?: No Communication: Independent Dressing (OT): Needs assistance Is this a change from baseline?: Change from baseline, expected to last <3days Grooming: Independent Feeding: Independent Bathing: Needs assistance Is this a change from baseline?: Change from baseline, expected to last <3 days Toileting: Needs assistance Is this a change from baseline?: Change from baseline, expected to last <3 days In/Out Bed: Needs assistance Is this a change from baseline?: Change from baseline, expected to last <3 days Walks in Home: Independent Does the patient have difficulty walking or climbing stairs?: No Weakness of Legs: None Weakness of Arms/Hands: None  Permission Sought/Granted                  Emotional Assessment Appearance:: Appears stated age     Orientation: : Oriented to Self,Oriented to Place,Oriented to  Time,Oriented to Situation Alcohol / Substance Use: Not Applicable Psych Involvement: No (comment)  Admission diagnosis:  NSTEMI (non-ST elevated myocardial infarction) ([place]) [I21.4] S/P CABG x 4 [Z95.1] Patient Active Problem List  Diagnosis Date Noted  . 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  . NSTEMI (non-ST elevated myocardial infarction) ([place]) 04/30/2020  . Elevated troponin 04/30/2020  . History of left breast cancer 11/25/2019  . Hyperlipidemia 11/25/2019  . Carotid stenosis 08/24/2017  . S/P total knee  arthroplasty 01/24/2016  . Pain 01/22/2016  . 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/2016  . PAD (peripheral artery disease) ([place]) 11/22/2014  . H/O malignant neoplasm of breast 11/22/2014  . HLD (hyperlipidemia) 11/22/2014  . Essential hypertension 11/22/2014  . 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

## 2020-05-08 NOTE — Progress Notes (Signed)
CARDIAC REHAB PHASE I   PRE:  Rate/Rhythm: 115 ST  BP:  Sitting: 145/75      SaO2: 91 RA  MODE:  Ambulation: 300 ft   POST:  Rate/Rhythm: 142 ST  BP:  Sitting: 165/70    SaO2: 85 RA --> 95 2L   Pt ambulated 353ft in hallway standby assist with front wheel [place]. Pt denies pain or dizziness, does endorse some SOB upon return to room. Pt unable to maintain sats on RA, please see qualifying note. Pt states she has a Valvo at home, but is requesting a 3-in-1, order placed. Encouraged continued ambulation and IS use. Will continue to follow.  [id number] Rufina Falco, RN BSN 05/08/2020 8:59 AM

## 2020-05-08 NOTE — Care Management Important Message (Signed)
Important Message  Patient Details  Name: LADELL BEY MRN: [id number] Date of Birth: 1940-04-08   Medicare Important Message Given:  Yes     Shelda Altes 05/08/2020, 9:20 AM

## 2020-05-08 NOTE — Progress Notes (Signed)
      [address]       [place] [address]             [phone]       5 Days Post-Op Procedure(s) (LRB): CORONARY ARTERY BYPASS GRAFTING (CABG) X FOUR ON PUMP USING LEFT INTERNAL MAMMARY ARTERY AND RIGHT ENDOSCOPIC SAPHEOUS VEIN HARVEST CONDUITS (N/A) TRANSESOPHAGEAL ECHOCARDIOGRAM (TEE) (N/A)   Subjective:  No new complaints.  Patient is feeling okay.  Her children will be helping her at discharge  + ambulation  + BM  Objective: Vital signs in last 24 hours: Temp:  [97.6 F (36.4 C)-97.9 F (36.6 C)] 97.7 F (36.5 C) (04/25 0300) Pulse Rate:  [96-108] 96 (04/25 0300) Cardiac Rhythm: Normal sinus rhythm;Bundle branch block (04/24 1900) Resp:  [19-20] 20 (04/25 0300) BP: (119-154)/(57-72) 154/72 (04/25 0300) SpO2:  [94 %-98 %] 96 % (04/25 0300) Weight:  [58.4 kg] 58.4 kg (04/25 0300)  General appearance: alert, cooperative and no distress Heart: regular rate and rhythm Lungs: clear to auscultation bilaterally Abdomen: soft, non-tender; bowel sounds normal; no masses,  no organomegaly Extremities: edema trace Wound: clean and dry  Lab Results: Recent Labs    05/06/20 0154 05/07/20 0049  WBC 6.1 6.4  HGB 8.6* 8.5*  HCT 26.5* 26.1*  PLT 114* 142*   BMET:  Recent Labs    05/07/20 0049 05/08/20 0128  NA 140 141  K 3.4* 4.2  CL 106 107  CO2 28 25  GLUCOSE 124* 120*  BUN 9 11  CREATININE 0.66 0.63  CALCIUM 8.5* 8.8*    PT/INR: No results for input(s): LABPROT, INR in the last 72 hours. ABG    Component Value Date/Time   PHART 7.361 05/03/2020 2243   HCO3 21.7 05/03/2020 2243   TCO2 23 05/03/2020 2243   ACIDBASEDEF 3.0 (H) 05/03/2020 2243   O2SAT 94.0 05/03/2020 2243   CBG (last 3)  Recent Labs    05/07/20 1641 05/07/20 2117 05/08/20 0619  GLUCAP 118* 111* 95    Assessment/Plan: S/P Procedure(s) (LRB): CORONARY ARTERY BYPASS GRAFTING (CABG) X FOUR ON PUMP USING LEFT INTERNAL MAMMARY ARTERY AND RIGHT ENDOSCOPIC SAPHEOUS VEIN  HARVEST CONDUITS (N/A) TRANSESOPHAGEAL ECHOCARDIOGRAM (TEE) (N/A)  1. CV- Sinus Tachycardia, occasional PVCs- BP remains elevated at times, will increase Lopressor at 50 mg BID 2. Pulm- on oxygen this morning, sats > 95%, will have nurse ambulate patient off oxygen to determine if home use is indicated 3. Renal- creatinine WNL, weight is below baseline no lasix at this time 4. Expected post operative blood loss anemia, mild 5. dispo- patient stable, for possible discharge today vs tomorrow   LOS: 7 days    Ellwood Handler, PA-C 05/08/2020

## 2020-05-09 LAB — GLUCOSE, CAPILLARY
Glucose-Capillary: 105 mg/dL — ABNORMAL HIGH (ref 70–99)
Glucose-Capillary: 90 mg/dL (ref 70–99)

## 2020-05-09 MED ORDER — METOPROLOL SUCCINATE ER 50 MG PO TB24: 50.0000 mg | ORAL_TABLET | Freq: Every day | ORAL | 1 refills | Status: DC

## 2020-05-09 MED ORDER — FUROSEMIDE 40 MG PO TABS: 40.0000 mg | ORAL_TABLET | Freq: Every day | ORAL | 0 refills | Status: DC

## 2020-05-09 MED ORDER — LOSARTAN POTASSIUM 25 MG PO TABS
25.0000 mg | ORAL_TABLET | Freq: Every day | ORAL | Status: DC
  Administered 2020-05-09: 25 mg via ORAL
  Filled 2020-05-09: qty 1

## 2020-05-09 MED ORDER — METOPROLOL SUCCINATE ER 50 MG PO TB24
50.0000 mg | ORAL_TABLET | Freq: Every day | ORAL | Status: DC
  Administered 2020-05-09: 50 mg via ORAL
  Filled 2020-05-09: qty 1

## 2020-05-09 MED ORDER — POTASSIUM CHLORIDE ER 20 MEQ PO TBCR: 20.0000 meq | EXTENDED_RELEASE_TABLET | Freq: Every day | ORAL | 0 refills | Status: DC

## 2020-05-09 MED ORDER — LOSARTAN POTASSIUM 25 MG PO TABS: 25.0000 mg | ORAL_TABLET | Freq: Every day | ORAL | 1 refills | Status: DC

## 2020-05-09 MED ORDER — CLOPIDOGREL BISULFATE 75 MG PO TABS
75.0000 mg | ORAL_TABLET | Freq: Every day | ORAL | 1 refills | Status: DC
Start: 2020-05-09 — End: 2020-05-22

## 2020-05-09 NOTE — Progress Notes (Signed)
Mobility Specialist - Progress Note   05/09/20 1412  Mobility  Activity Ambulated in hall  Level of Assistance Standby assist, set-up cues, supervision of patient - no hands on  Assistive Device Front wheel Shan  Distance Ambulated (ft) 240 ft  Mobility Response Tolerated well  Mobility performed by Mobility specialist  $Mobility charge 1 Mobility   Pt asx throughout ambulation. Pt back in bed after walk. VSS.   Pricilla Handler Mobility Specialist Mobility Specialist Phone: [phone]

## 2020-05-09 NOTE — Plan of Care (Signed)

## 2020-05-09 NOTE — TOC Transition Note (Signed)
Transition of Care (TOC) - CM/SW Discharge Note Marvetta Gibbons RN, BSN Transitions of Care Unit 4E- RN Case Manager See Treatment Team for direct phone #    Patient Details  Name: KYNZLI REASE MRN: [id number] Date of Birth: 1940-08-22  Transition of Care [facility]) CM/SW Contact:  Dawayne Patricia, RN Phone Number: 05/09/2020, 4:56 PM   Clinical Narrative:    Pt stable for transition home today, home 02 and 3n1 have been delivered to room. No further TOC needs noted.    Final next level of care: Home/Self Care Barriers to Discharge: No Barriers Identified   Patient Goals and CMS Choice Patient states their goals for this hospitalization and ongoing recovery are:: return home      Discharge Placement             Home          Discharge Plan and Services   Discharge Planning Services: CM Consult            DME Arranged: 3-N-1,Oxygen DME Agency: [employer] Date DME Agency Contacted: 05/08/20 Time DME Agency Contacted: [phone] Representative spoke with at DME Agency: [place] ([place]) Interventions     Readmission Risk Interventions Readmission Risk Prevention Plan 05/09/2020  Post Dischage Appt Complete  Medication Screening Complete  Transportation Screening Complete  Some recent data might be hidden

## 2020-05-09 NOTE — Plan of Care (Signed)
  Problem: Education: Goal: Knowledge of General Education information will improve Description: Including pain rating scale, medication(s)/side effects and non-pharmacologic comfort measures Outcome: Adequate for Discharge   Problem: Health Behavior/Discharge Planning: Goal: Ability to manage health-related needs will improve Outcome: Adequate for Discharge   Problem: Clinical Measurements: Goal: Ability to maintain clinical measurements within normal limits will improve Outcome: Adequate for Discharge Goal: Will remain free from infection Outcome: Adequate for Discharge Goal: Diagnostic test results will improve Outcome: Adequate for Discharge Goal: Respiratory complications will improve Outcome: Adequate for Discharge Goal: Cardiovascular complication will be avoided Outcome: Adequate for Discharge   Problem: Activity: Goal: Risk for activity intolerance will decrease Outcome: Adequate for Discharge   Problem: Nutrition: Goal: Adequate nutrition will be maintained Outcome: Adequate for Discharge   Problem: Coping: Goal: Level of anxiety will decrease Outcome: Adequate for Discharge   Problem: Elimination: Goal: Will not experience complications related to bowel motility Outcome: Adequate for Discharge Goal: Will not experience complications related to urinary retention Outcome: Adequate for Discharge   Problem: Pain Managment: Goal: General experience of comfort will improve Outcome: Adequate for Discharge   Problem: Safety: Goal: Ability to remain free from injury will improve Outcome: Adequate for Discharge   Problem: Skin Integrity: Goal: Risk for impaired skin integrity will decrease Outcome: Adequate for Discharge   Problem: Education: Goal: Will demonstrate proper wound care and an understanding of methods to prevent future damage Outcome: Adequate for Discharge Goal: Knowledge of disease or condition will improve Outcome: Adequate for Discharge Goal:  Knowledge of the prescribed therapeutic regimen will improve Outcome: Adequate for Discharge Goal: Individualized Educational Video(s) Outcome: Adequate for Discharge   Problem: Activity: Goal: Risk for activity intolerance will decrease Outcome: Adequate for Discharge   Problem: Cardiac: Goal: Will achieve and/or maintain hemodynamic stability Outcome: Adequate for Discharge   Problem: Clinical Measurements: Goal: Postoperative complications will be avoided or minimized Outcome: Adequate for Discharge   Problem: Respiratory: Goal: Respiratory status will improve Outcome: Adequate for Discharge   Problem: Skin Integrity: Goal: Wound healing without signs and symptoms of infection Outcome: Adequate for Discharge Goal: Risk for impaired skin integrity will decrease Outcome: Adequate for Discharge   Problem: Urinary Elimination: Goal: Ability to achieve and maintain adequate renal perfusion and functioning will improve Outcome: Adequate for Discharge

## 2020-05-09 NOTE — Progress Notes (Signed)
CARDIAC REHAB PHASE I   PRE:  Rate/Rhythm: 104 ST  BP:  Sitting: 147/71      SaO2: 96 1L  MODE:  Ambulation: 350 ft   POST:  Rate/Rhythm: 132 ST  BP:  Sitting: 150/70    SaO2: 96 RA   Pt ambulated 344ft in hallway standby assist with front wheel [place]. Pt denies pain, SOB, or dizziness. Pt states strength is improving. D/c education completed with pt and family. Pt educated on importance of site care and monitoring incisions daily. Encouraged continued IS use, walks, and sternal precautions. Pt given in-the-tube sheet along with heart healthy diet. Reviewed restrictions and exercise guidelines. Will refer to CRP II [place].   [id number] Rufina Falco, RN BSN 05/09/2020 11:19 AM

## 2020-05-09 NOTE — Progress Notes (Addendum)
Progress Note  Patient Name: [name] Date of Encounter: 05/09/2020  Primary Cardiologist: New to [facility]>>needs to follow in [facility] office   Subjective   Feeling well this AM. Wants to go home. Looks great. Ambulating well with no chest pain   Inpatient Medications    Scheduled Meds: . aspirin EC  325 mg Oral Daily  . atorvastatin  40 mg Oral Daily  . Chlorhexidine Gluconate Cloth  6 each Topical Daily  . furosemide  40 mg Oral Daily  . insulin aspart  0-24 Units Subcutaneous TID AC & HS  . insulin aspart  3 Units Subcutaneous TID WC  . metoprolol tartrate  50 mg Oral BID  . pantoprazole  40 mg Oral Daily  . timolol  1 drop Both Eyes Daily   Continuous Infusions: . sodium chloride    . lactated ringers Stopped (05/05/20 0328)   PRN Meds: loperamide, metoprolol tartrate, ondansetron (ZOFRAN) IV, oxyCODONE, sodium chloride flush, traMADol   Vital Signs    Vitals:   05/08/20 2300 05/09/20 0330 05/09/20 0351 05/09/20 0500  BP: (!) 127/91 (!) 154/68 (!) 159/76   Pulse: 90 96 (!) 101   Resp: 20 20 20    Temp: 97.9 F (36.6 C) 97.7 F (36.5 C) 98.1 F (36.7 C)   TempSrc: Oral Oral Oral   SpO2: 97% 97% 97%   Weight:    58.4 kg  Height:        Intake/Output Summary (Last 24 hours) at 05/09/2020 0758 Last data filed at 05/08/2020 1300 Gross per 24 hour  Intake 120 ml  Output --  Net 120 ml   Filed Weights   05/07/20 0313 05/08/20 0300 05/09/20 0500  Weight: 61.3 kg 58.4 kg 58.4 kg    Physical Exam   General: Well developed, well nourished, NAD Neck: Negative for carotid bruits. No JVD Lungs:Clear to ausculation bilaterally. No wheezes, rales, or rhonchi. Breathing is unlabored. Cardiovascular: RRR with S1 S2. No murmurs Abdomen: Soft, non-tender, non-distended. No obvious abdominal masses. Extremities: No edema. Radial pulses 2+ bilaterally Neuro: Alert and oriented. No focal deficits. No facial asymmetry. MAE spontaneously. Psych: Responds to  questions appropriately with normal affect.    Labs    Chemistry Recent Labs  Lab 05/06/20 0154 05/07/20 0049 05/08/20 0128  NA 139 140 141  K 3.3* 3.4* 4.2  CL 108 106 107  CO2 24 28 25   GLUCOSE 132* 124* 120*  BUN 16 9 11   CREATININE 0.61 0.66 0.63  CALCIUM 8.7* 8.5* 8.8*  GFRNONAA >60 >60 >60  ANIONGAP 7 6 9      Hematology Recent Labs  Lab 05/05/20 0326 05/06/20 0154 05/07/20 0049  WBC 8.2 6.1 6.4  RBC 2.68* 2.63* 2.58*  HGB 8.8* 8.6* 8.5*  HCT 27.0* 26.5* 26.1*  MCV 100.7* 100.8* 101.2*  MCH 32.8 32.7 32.9  MCHC 32.6 32.5 32.6  RDW 14.0 14.2 14.6  PLT 117* 114* 142*    Cardiac EnzymesNo results for input(s): TROPONINI in the last 168 hours. No results for input(s): TROPIPOC in the last 168 hours.   BNPNo results for input(s): BNP, PROBNP in the last 168 hours.   DDimer No results for input(s): DDIMER in the last 168 hours.   Radiology    DG Chest 2 View  Result Date: 05/08/2020 CLINICAL DATA:  Pleural effusion. EXAM: CHEST - 2 VIEW COMPARISON:  May 06, 2020. FINDINGS: Stable cardiomegaly. Status post coronary bypass graft. Stable central pulmonary vascular congestion. No pneumothorax is noted.  Bibasilar atelectasis or edema is noted with associated pleural effusions, left greater than right. Bony thorax is unremarkable. IMPRESSION: Stable cardiomegaly with central pulmonary vascular congestion. Stable bibasilar opacities and pleural effusions as described above. Electronically Signed   By: Marijo Conception M.D.   On: 05/08/2020 08:45   Telemetry    05/09/20 ST with HRs in the low 100's - Personally Reviewed  ECG    No new tracing as of 05/09/20 - Personally Reviewed  Cardiac Studies   LHC 05/01/2020:  There is moderate left ventricular systolic dysfunction.  LV end diastolic pressure is mildly elevated.  The left ventricular ejection fraction is 35-45% by visual estimate.  Ost RCA to Prox RCA lesion is 60% stenosed.  Mid RCA to Dist RCA lesion  is 100% stenosed.  Ost LM to Dist LM lesion is 80% stenosed.  Ost Cx to Prox Cx lesion is 80% stenosed.  Ost LAD to Prox LAD lesion is 80% stenosed.  1. 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 Significant pressure dampening with catheter engagement of the left main coronary artery. 2. Moderately reduced LV systolic function. Mildly elevated left ventricular end-diastolic pressure.  Recommendations: Recommend transfer to [facility] for CABG evaluation. Obtain an echocardiogram to better evaluate LV systolic function and valvular abnormalities. Resume heparin 6 hours after sheath pull.   Diagnostic Dominance: Right  Left Main  Ost LM to Dist LM lesion is 80% stenosed. The lesion is severely calcified.  Left Anterior Descending  Ost LAD to Prox LAD lesion is 80% stenosed. The lesion is eccentric. The lesion is severely calcified.  Left Circumflex  Ost Cx to Prox Cx lesion is 80% stenosed. The lesion is severely calcified.  Right Coronary Artery  Ost RCA to Prox RCA lesion is 60% stenosed. The lesion is severely calcified.  Mid RCA to Dist RCA lesion is 100% stenosed.  Right Posterior Atrioventricular Artery  Collaterals  RPAV filled by collaterals from RV Branch.    First Right Posterolateral Branch  Collaterals  1st RPL filled by collaterals from Dist LAD.     Intervention   No interventions have been documented.  Wall Motion       Resting                Left Heart  Left Ventricle The left ventricular size is normal. There is moderate left ventricular systolic dysfunction. LV end diastolic pressure is mildly elevated. The left ventricular ejection fraction is 35-45% by visual estimate. There are LV function abnormalities due to global hypokinesis.   Coronary Diagrams   Diagnostic Dominance: Right      Echo 05/02/20:  1. Left ventricular ejection fraction, by estimation, is 40 to 45%. The  left  ventricle has mildly decreased function. The left ventricle  demonstrates global hypokinesis. There is mild concentric left ventricular  hypertrophy. Indeterminate diastolic  filling due to E-A fusion. Elevated left ventricular end-diastolic  pressure.  2. Right ventricular systolic function is normal. The right ventricular  size is normal. There is mildly elevated pulmonary artery systolic  pressure. The estimated right ventricular systolic pressure is 67.5 mmHg.  3. Left atrial size was moderately dilated.  4. The mitral valve is normal in structure. Moderate mitral valve  regurgitation.  5. The aortic valve is grossly normal. Aortic valve regurgitation is not  visualized.  6. The inferior vena cava is normal in size with greater than 50%  respiratory variability, suggesting right atrial pressure of 3 mmHg.  Patient Profile  80 y.o. female with PVD s/p intervention of the left common iliac artery in 2000, left-sided breast cancer status post lumpectomy and radiation, HTN, HLD, and prior tobacco use at 1 pack/day quitting over 20 years ago who was admitted to [facility] on 4/17 with an NSTEMI>>found to have severe 3VD>>now s/p CABG with LIMA to LAD, SVG to PDA and sequential OM, SVG to OM1   Assessment & Plan   1.  NSTEMI>>found to have severe 3VD>>s/p CABG: -LHC on 4/18 demonstrated severe calcification of the left main coronary artery and three-vessel CAD>>transferred to [facility] for cardiothoracic surgical evaluation for CABG>>performed 05/03/20 with LIMA to LAD, SBG to PDA and sequential OM, SVG to OM1 -Continue ASA, statin, beta blocker>>consolidate to Toprol  -Consider adding Plavix in the setting of NSTEMI>>leave to TCTS to decide   2.  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  HLD: -LDL 32 this admission -Continue statin -Follow with repeat lipid and LFTs in 6-8 weeks   4. HTN: -Elevated, 159/76>>154/68>>127/91 -Metoprolol has been up-titrated to 50mg  BID>consolidate with Toprol -Add losartan to regimen   5.  PVD: -S/p left lower extremity intervention  -No symptoms of critical limb ischemia -Follow-up with vascular surgery as directed  6. PVCs: -Having frequent PVCs>>>metoprolol titrated to 50mg  BID yesterday  -Asymptomatic  -Remains tachycardic   Signed, Kathyrn Drown NP-C [facility] Pager: [phone] 05/09/2020, 7:58 AM     For questions or updates, please contact   Please consult [email or web address] for contact info under Cardiology/STEMI.  Patient seen, examined. Available data reviewed. Agree with findings, assessment, and plan as outlined by Kathyrn Drown, NP-C. The patient feels well. Her daughter is at the bedside. On my exam, she is alert and oriented, NAD. Lungs CTA, heart tachy and regular no murmur, extremities no edema. Abdomen: soft, NT. Tele shows sinus tach HR 100 bpm. Data reviewed. Agree with medicine recommendations as above. Started on losartan this morning. Will arrange cardiology follow-up in [place]. Anticipate DC home in next 24 hours per surgical team notes.   Sherren Mocha, M.D. 05/09/2020 1:08 PM

## 2020-05-09 NOTE — Progress Notes (Addendum)
      [address]       [place] [address]             [phone]      6 Days Post-Op Procedure(s) (LRB): CORONARY ARTERY BYPASS GRAFTING (CABG) X FOUR ON PUMP USING LEFT INTERNAL MAMMARY ARTERY AND RIGHT ENDOSCOPIC SAPHEOUS VEIN HARVEST CONDUITS (N/A) TRANSESOPHAGEAL ECHOCARDIOGRAM (TEE) (N/A) Subjective: Feels good this morning. No complaints  Objective: Vital signs in last 24 hours: Temp:  [97.5 F (36.4 C)-98.1 F (36.7 C)] 98.1 F (36.7 C) (04/26 0351) Pulse Rate:  [87-116] 101 (04/26 0351) Cardiac Rhythm: Normal sinus rhythm (04/25 1900) Resp:  [19-20] 20 (04/26 0351) BP: (127-159)/(60-91) 159/76 (04/26 0351) SpO2:  [91 %-99 %] 97 % (04/26 0351) Weight:  [58.4 kg] 58.4 kg (04/26 0500)     Intake/Output from previous day: 04/25 0701 - 04/26 0700 In: 120 [P.O.:120] Out: -  Intake/Output this shift: No intake/output data recorded.  General appearance: alert, cooperative and no distress Heart: regular rate and rhythm, S1, S2 normal, no murmur, click, rub or gallop Lungs: clear to auscultation bilaterally Abdomen: soft, non-tender; bowel sounds normal; no masses,  no organomegaly Extremities: extremities normal, atraumatic, no cyanosis or edema Wound: clean and dry  Lab Results: Recent Labs    05/07/20 0049  WBC 6.4  HGB 8.5*  HCT 26.1*  PLT 142*   BMET:  Recent Labs    05/07/20 0049 05/08/20 0128  NA 140 141  K 3.4* 4.2  CL 106 107  CO2 28 25  GLUCOSE 124* 120*  BUN 9 11  CREATININE 0.66 0.63  CALCIUM 8.5* 8.8*    PT/INR: No results for input(s): LABPROT, INR in the last 72 hours. ABG    Component Value Date/Time   PHART 7.361 05/03/2020 2243   HCO3 21.7 05/03/2020 2243   TCO2 23 05/03/2020 2243   ACIDBASEDEF 3.0 (H) 05/03/2020 2243   O2SAT 94.0 05/03/2020 2243   CBG (last 3)  Recent Labs    05/08/20 1652 05/08/20 2029 05/09/20 0612  GLUCAP 92 97 105*    Assessment/Plan: S/P Procedure(s) (LRB): CORONARY ARTERY  BYPASS GRAFTING (CABG) X FOUR ON PUMP USING LEFT INTERNAL MAMMARY ARTERY AND RIGHT ENDOSCOPIC SAPHEOUS VEIN HARVEST CONDUITS (N/A) TRANSESOPHAGEAL ECHOCARDIOGRAM (TEE) (N/A)  1. CV- NSR in the 90s, she was on atenolol 100mg  daily at home. She is on 50mg  BID metoprolol here in the hospital. Remains hypertensive at times.  2. Pulm- arranging home oxygen  3. Renal- creatinine 0.63 , electrolytes okay, weight is below baseline no lasix at this time 4. Expected post operative blood loss anemia, mild 5. Blood glucose well controlled   Plan: Remains hypertensive at times and occasionally tachycardia. She may need more beta blocker. Hopeful for home later today. Dr. Cyndia Bent to see later today.   LOS: 8 days    Elgie Collard 05/09/2020  Chart reviewed, patient examined, agree with above. She feels well and wants to go home. Home oxygen arranged since sats dropped from 91% to 85% with ambulation. Room air ABG preop showed sat of 93%. I discussed instructions with daughter and answered their questions.

## 2020-05-10 ENCOUNTER — Other Ambulatory Visit: Payer: Self-pay | Admitting: *Deleted

## 2020-05-10 ENCOUNTER — Telehealth: Payer: Self-pay | Admitting: *Deleted

## 2020-05-10 MED ORDER — ATORVASTATIN CALCIUM 20 MG PO TABS: 40.0000 mg | ORAL_TABLET | Freq: Every evening | ORAL | 0 refills | Status: DC

## 2020-05-10 NOTE — Telephone Encounter (Signed)
Patient called stating her pharmacy never received her prescription for atorvastatin. Per T. Harriet Pho, prescription for Atorvastatin 20mg  2 tablets PO every night for a total of 40mg  60 tablets sent into patients preferred pharmacy of [place]. Patient made aware.

## 2020-05-22 ENCOUNTER — Other Ambulatory Visit: Payer: Self-pay

## 2020-05-22 ENCOUNTER — Ambulatory Visit (INDEPENDENT_AMBULATORY_CARE_PROVIDER_SITE_OTHER): Payer: Medicare Other | Admitting: Physician Assistant

## 2020-05-22 ENCOUNTER — Other Ambulatory Visit
Admission: RE | Admit: 2020-05-22 | Discharge: 2020-05-22 | Disposition: A | Discharge status: Home / Self Care | Payer: Medicare Other | Attending: Physician Assistant | Admitting: Physician Assistant

## 2020-05-22 ENCOUNTER — Encounter: Payer: Self-pay | Admitting: Physician Assistant

## 2020-05-22 VITALS — BP 130/70 | HR 77 | Ht 60.0 in | Wt 120.0 lb

## 2020-05-22 DIAGNOSIS — I739 Peripheral vascular disease, unspecified: Secondary | ICD-10-CM

## 2020-05-22 DIAGNOSIS — I10 Essential (primary) hypertension: Secondary | ICD-10-CM | POA: Diagnosis present

## 2020-05-22 DIAGNOSIS — I214 Non-ST elevation (NSTEMI) myocardial infarction: Secondary | ICD-10-CM | POA: Diagnosis present

## 2020-05-22 DIAGNOSIS — I493 Ventricular premature depolarization: Secondary | ICD-10-CM

## 2020-05-22 DIAGNOSIS — R918 Other nonspecific abnormal finding of lung field: Secondary | ICD-10-CM | POA: Diagnosis not present

## 2020-05-22 DIAGNOSIS — Z951 Presence of aortocoronary bypass graft: Secondary | ICD-10-CM

## 2020-05-22 DIAGNOSIS — I252 Old myocardial infarction: Secondary | ICD-10-CM | POA: Diagnosis not present

## 2020-05-22 DIAGNOSIS — E785 Hyperlipidemia, unspecified: Secondary | ICD-10-CM

## 2020-05-22 DIAGNOSIS — E781 Pure hyperglyceridemia: Secondary | ICD-10-CM

## 2020-05-22 DIAGNOSIS — I2510 Atherosclerotic heart disease of native coronary artery without angina pectoris: Secondary | ICD-10-CM

## 2020-05-22 DIAGNOSIS — I447 Left bundle-branch block, unspecified: Secondary | ICD-10-CM

## 2020-05-22 DIAGNOSIS — D649 Anemia, unspecified: Secondary | ICD-10-CM

## 2020-05-22 DIAGNOSIS — I5020 Unspecified systolic (congestive) heart failure: Secondary | ICD-10-CM

## 2020-05-22 LAB — BASIC METABOLIC PANEL
Anion gap: 8 (ref 5–15)
BUN: 8 mg/dL (ref 8–23)
CO2: 26 mmol/L (ref 22–32)
Calcium: 9.7 mg/dL (ref 8.9–10.3)
Chloride: 100 mmol/L (ref 98–111)
Creatinine, Ser: 0.55 mg/dL (ref 0.44–1.00)
GFR, Estimated: >60 mL/min (ref >60)
Glucose, Bld: 101 mg/dL — ABNORMAL HIGH (ref 70–99)
Potassium: 4.1 mmol/L (ref 3.5–5.1)
Sodium: 134 mmol/L — ABNORMAL LOW (ref 135–145)

## 2020-05-22 LAB — CBC
HCT: 32.6 % — ABNORMAL LOW (ref 36.0–46.0)
Hemoglobin: 10.7 g/dL — ABNORMAL LOW (ref 12.0–15.0)
MCH: 32.4 pg (ref 26.0–34.0)
MCHC: 32.8 g/dL (ref 30.0–36.0)
MCV: 98.8 fL (ref 80.0–100.0)
Platelets: 256 10*3/uL (ref 150–400)
RBC: 3.3 MIL/uL — ABNORMAL LOW (ref 3.87–5.11)
RDW: 14.6 % (ref 11.5–15.5)
WBC: 4.9 10*3/uL (ref 4.0–10.5)
nRBC: 0 % (ref 0.0–0.2)

## 2020-05-22 MED ORDER — CLOPIDOGREL BISULFATE 75 MG PO TABS: 75.0000 mg | ORAL_TABLET | Freq: Every day | ORAL | 3 refills | Status: AC

## 2020-05-22 MED ORDER — LOSARTAN POTASSIUM 25 MG PO TABS: 25.0000 mg | ORAL_TABLET | Freq: Every day | ORAL | 3 refills | Status: DC

## 2020-05-22 NOTE — Progress Notes (Signed)
Office Visit    Patient Name: [name] Date of Encounter: 05/22/2020  PCP:  Derinda Late, MD   [place]  Cardiologist: Dr. Garen Lah Advanced Practice Provider:  No care team member to display Electrophysiologist:  None :[id number]}   Chief Complaint    Chief Complaint  Patient presents with  . Follow-up    Follow up after procedure. Medications verbally reviewed with patient.     80 y.o. female with history of peripheral vascular disease s/p intervention of the left common iliac artery in 2000, left-sided breast cancer s/p lumpectomy and radiation, hypertension, hyperlipidemia, prior tobacco use at 1 pack/day with quit date over 20 years prior, and seen recently in the hospital for chest pain and new left bundle branch block with NSTEMI and subsequent cardiac catheterization.  Past Medical History    Past Medical History:  Diagnosis Date  . Arthritis   . Breast cancer ([place]) 2011   radiation- Left  . Hypertension   . Personal history of radiation therapy   . PVD (peripheral vascular disease) ([place])    Past Surgical History:  Procedure Laterality Date  . ABDOMINAL HYSTERECTOMY    . BREAST BIOPSY Left 12/21/2009   positive, radiation dcis  . BREAST EXCISIONAL BIOPSY Left 01/17/2010   lumpectomy DCIS  . BREAST LUMPECTOMY    . CATARACT EXTRACTION W/ INTRAOCULAR LENS IMPLANT & ANTERIOR VITRECTOMY, BILATERAL    . CHOLECYSTECTOMY    . COLONOSCOPY WITH PROPOFOL N/A 09/25/2017   Procedure: COLONOSCOPY WITH PROPOFOL;  Surgeon: Lollie Sails, MD;  Location: [facility] ENDOSCOPY;  Service: Endoscopy;  Laterality: N/A;  . CORONARY ARTERY BYPASS GRAFT N/A 05/03/2020   Procedure: CORONARY ARTERY BYPASS GRAFTING (CABG) X FOUR ON PUMP USING LEFT INTERNAL MAMMARY ARTERY AND RIGHT ENDOSCOPIC SAPHEOUS VEIN HARVEST CONDUITS;  Surgeon: Melrose Nakayama, MD;  Location: [place];  Service: Open Heart Surgery;  Laterality: N/A;  . DILATION AND CURETTAGE OF  UTERUS    . JOINT REPLACEMENT Right 01/24/2016  . KNEE ARTHROPLASTY Right 01/24/2016   Procedure: COMPUTER ASSISTED TOTAL KNEE ARTHROPLASTY;  Surgeon: Dereck Leep, MD;  Location: [facility] ORS;  Service: Orthopedics;  Laterality: Right;  . LEFT HEART CATH AND CORONARY ANGIOGRAPHY N/A 05/01/2020   Procedure: LEFT HEART CATH AND CORONARY ANGIOGRAPHY;  Surgeon: Wellington Hampshire, MD;  Location: [place] CV LAB;  Service: Cardiovascular;  Laterality: N/A;  . stent in Lt leg Left   . TEE WITHOUT CARDIOVERSION N/A 05/03/2020   Procedure: TRANSESOPHAGEAL ECHOCARDIOGRAM (TEE);  Surgeon: Melrose Nakayama, MD;  Location: [place];  Service: Open Heart Surgery;  Laterality: N/A;    Allergies  Allergies  Allergen Reactions  . Ibuprofen Hives  . Penicillins Hives    Tolerated Ancef in the past  . Aleve [Naproxen Sodium] Hives  . Avapro  [Irbesartan] Rash    Other reaction(s): We[place]  . Azithromycin Hives  . Nickel Itching    Redness, itching and fluid discharge with nickel earrings  . Other Other (See Comments)    Nickel"ears ran fluid and itching" redness and "sensitive" Nickel"ears ran fluid and itching"    History of Present Illness    Peggy FREDRICKSEN is a 80 y.o. female with PMH as above.  Past history includes PVD with history of stenting of left common iliac artery in 2000, left-sided breast cancer s/p lumpectomy and radiation, hypertension, hyperlipidemia, prior tobacco use at 1 pack/day and quitting over 20 years prior, and recent admission with new left  bundle branch block and NSTEMI with catheterization as below.  Prior to most recent admission, she did not have a history of cardiovascular disease to her knowledge.  She was not following with a cardiologist.  On 04/30/2020, she presented to [facility] with report of a several week history of progressive exertional angina.. The night before her presentation (4/16), she developed severe substernal chest pain with left arm radiation and  associated nausea and emesis.  CP persisted until approximately 8 AM the morning of her presentation to [facility] ED.  At the time of her consultation, she was reportedly chest pain-free.    In the ED, EKG showed sinus rhythm with new left bundle branch block when compared with prior tracings, dating back to 2011.  Initial high-sensitivity troponin 19,201 with delta troponin of 14,653.  BNP 646.  Chest x-ray showed mass in posterior left midlung with recommendation for chest CT recommended.  Subsequent CTA without evidence of pulmonary embolism, along with a rounded consolidation in the left lower lobe surrounding an apparent 3.3 x 2.5 x 2.5 cm hypoenhancing lesion, as well as small left and trace right pleural effusions.  Mild cardiomegaly with suspected interstitial edema findings noted, consistent with bronchitis or reactive airway disease.  Nodular contour of left hepatic lobe noted, suggestive of cirrhosis and aortic atherosclerosis.  She was started on ASA and IV heparin.  Beta-blocker and statin were also recommended.  Plan was for [facility] the next morning if no recurrent chest pain -if recurrent chest pain, catheterization indicated before that time/overnight.  On-call interventionalists was informed.  She underwent catheterization 4/18 with severe calcification of the left main and three-vessel CAD as outlined below.  Significant pressure dampening was noted with catheter engagement of left main coronary artery.  She had moderately reduced LV SF with EF 35 to 40% by LV gram and mildly elevated LVEDP.  Recommendation was for resumption of IV heparin 6 hours post sheath pull and transferred to [facility] for consideration of CABG.  Echo was ordered and showed EF 40 to 45% with global hypokinesis and moderate mitral regurgitation.  She was continued on Coreg, Lipitor, gentle diuresis.  Amlodipine was held to escalate GDMT.  Follow-up with vascular surgery was recommended for PVD.  On 4/20, she underwent CABG with  LIMA to LAD, SVG to PDA, and sequential OM, SVG to OM1.  She was continued on ASA and Plavix.  Given her elevated heart pressures, she was continued on diuresis with Lasix 40 mg daily.  Discharge weight 128 pounds with admission weight 130 pounds -she was noted to be net positive for the admission of note.  Addition of spironolactone recommended at RTC/escalation of GDMT as tolerated.  Her LDL was well controlled at 32 with recommendation to continue statin and repeat lipid and liver in 6 to 8 weeks.  Her BP was elevated throughout admission with metoprolol consolidated to Toprol at discharge.  It was recommended losartan be added at RTC.  Frequent PVCs were noted on telemetry monitoring with patient asymptomatic but tachycardic on telemetry and reassessment recommended RTC.  Today, 05/22/2020, she returns to clinic and notes she is overall doing well since discharge after her CABG as outlined above.  She notes a left-sided twinge that is brief in duration and self resolves.  This twinge is different from her previously reported severe substernal chest pain before her initial catheterization and at the time of her non-STEMI.  She reports that she has been healing well and denies any significant loss of feeling  around her sternal incision.  No erythema or edema/infection noted per patient regarding her sternal incision and on exam today.  She reports follow-up with cardiothoracic surgery 6/10.  She does note some swelling and pain associated with her right lower extremity incision for vessel harvest.  She reports erythema and edema noted on exam today is actually improved from right after discharge, during which time she also had drainage but denies any associated fevers or clear evidence of infection.  She reports fatigue.  She is not sleeping well.  She usually sleeps from 3 AM to 6 AM.  Recently, she has started sleeping in her recliner, given her husband used to sleep in this recliner, and it helps her to feel  close to him after his passing away.  She denies sleeping in the recliner due to orthopnea.  No reported symptoms consistent with volume overload, despite discontinuing diuresis with report of only 3 days Lasix provided at discharge and documentation not indicating that Lasix would be limited to 3 days.  She reports earlier loose stools/trouble with bowel movements, resolving after a few days of loperamide with recommendation to avoid this medication in the future from a cardiac standpoint.  On review of EMR, however, Lasix course was not adjusted/limited to 3 days after diarrhea, which also resolved before discharge.  Final cardiology note recommends Lasix 40 mg daily with no end date and with no limitation to 3 pills.  Suspect discharge air.  No tachypalpitations.  No presyncope or syncope.    We will ensure she has adequate refills today.   We will need to restart her Lasix.  As above, and after very thorough review of EMR and double checking each note, there is no corresponding documentation to indicate the reason for providing patient with only 3 pills at discharge.  As below under A/P, plan is for restart after BMET and once tolerating losartan either between visits or at next clinic visit.  She has not yet started cardiac rehab but agreeable to slow escalation of walking around the house as tolerated with family supervision during that time, given family will start to ease up on 24/7 monitoring of patient at this time and now that she has had her first cardiology follow-up..  She intends to follow-up with pulmonology regarding oxygen and lung mass seen on CT during admission.  Will defer questions regarding stopping oxygen to pulmonology.  Education provided regarding heart failure and coronary artery disease provided today, as well as education regarding risk factor modification and medical management.  She will monitor her right lower extremity SVT graft incision site as below. Home  Medications   Current Outpatient Medications  Medication Instructions  . acetaminophen (TYLENOL) 1,000 mg, Oral, Every 6 hours PRN  . aspirin EC 81 mg, Oral, Daily  . atorvastatin (LIPITOR) 40 mg, Oral, Daily at bedtime  . atorvastatin (LIPITOR) 40 mg, Oral, Nightly  . Calcium Carbonate-Vit D-Min (CALTRATE 600+D PLUS PO) 1 tablet, Oral, Daily  . clopidogrel (PLAVIX) 75 mg, Oral, Daily  . diphenhydrAMINE (BENADRYL) 25 mg, Oral, Every 6 hours PRN  . diphenhydrAMINE-zinc acetate (BENADRYL) cream 1 g, Topical, Daily PRN  . furosemide (LASIX) 40 mg, Oral, Daily  . loperamide (IMODIUM) 2 mg, Oral, As needed  . losartan (COZAAR) 25 mg, Oral, Daily  . metoprolol succinate (TOPROL-XL) 50 mg, Oral, Daily, Take with or immediately following a meal.  . Multiple Vitamin (MULTI-VITAMINS) TABS 1 tablet, Oral, Daily  . Potassium Chloride ER 20 MEQ TBCR 20 mEq, Oral,  Daily  . traMADol (ULTRAM) 50 mg, Oral, Every 6 hours PRN     Review of Systems    She reports occasional and brief left-sided twinge that is different from the severe substernal chest pain experienced prior to her initial catheterization at [facility] and before her CABG at [facility].  She reports edema of her right lower extremity incision site with previous drainage; however, this is improving with some erythema/edema noted on today's exam and recommendation that she monitor this closely and call the office or cardiothoracic surgery if any worsening symptoms/evidence of infection/fever and patient agreement.  She notes ongoing fatigue with sleep between 3 AM and 6 AM in a recliner due to missing her deceased husband.  No orthopnea..  She notes some indigestion with use of loperamide, but she has discontinued this medication since that time.  No, palpitations, pnd, v, presyncope, syncope, weight gain, or early satiety.  All other systems reviewed and are otherwise negative except as noted above.  Physical Exam    VS:  BP 130/70 (BP Location: Left  Arm, Patient Position: Sitting, Cuff Size: Normal)   Pulse 77   Ht 5' (1.524 m)   Wt 120 lb (54.4 kg)   SpO2 97%   BMI 23.44 kg/m  , BMI Body mass index is 23.44 kg/m. GEN: Elderly female, no acute distress, joined by family.  Mask in place. HEENT: normal. Neck: Supple, no JVD, carotid bruits, or masses. Cardiac: RRR, 1/6 systolic murmur LLSB.  No rubs, or gallops. No clubbing, cyanosis.  Right lower extremity edema noted s/p CABG with some erythema noted around her incision site and recommendation to closely monitor as below.  Radials/DP/PT 2+ and equal bilaterally.   *Sternal incision sites healing well, though right lower extremity harvest site does appear irritated with scab recently disturbed.  No signs of infection as of today. Respiratory:  Respirations regular and unlabored, bilateral reduced breath sounds, worse at the bases. GI: Soft, nontender, nondistended, BS + x 4. MS: no deformity or atrophy.  As above, incision site at right lower extremity with mild erythema and edema and substernal incision site healing well. Skin: warm and dry, no rash. Neuro:  Strength and sensation are intact. Psych: Normal affect.  Accessory Clinical Findings    ECG personally reviewed by me today - NSR, 77 bpm, left bundle branch block, T wave inversion noted in 1, 2, V6- no acute changes.  VITALS Reviewed today   Temp Readings from Last 3 Encounters:  05/09/20 98.2 F (36.8 C) (Oral)  05/01/20 98.6 F (37 C) (Oral)  09/25/17 (!) 97.2 F (36.2 C)   BP Readings from Last 3 Encounters:  05/22/20 130/70  05/09/20 (!) 150/70  05/01/20 (!) 142/66   Pulse Readings from Last 3 Encounters:  05/22/20 77  05/09/20 (!) 108  05/01/20 89    Wt Readings from Last 3 Encounters:  05/22/20 120 lb (54.4 kg)  05/09/20 128 lb 11.2 oz (58.4 kg)  04/30/20 131 lb (59.4 kg)     LABS  reviewed today   Lab Results  Component Value Date   WBC 6.4 05/07/2020   HGB 8.5 (L) 05/07/2020   HCT 26.1 (L)  05/07/2020   MCV 101.2 (H) 05/07/2020   PLT 142 (L) 05/07/2020   Lab Results  Component Value Date   CREATININE 0.63 05/08/2020   BUN 11 05/08/2020   NA 141 05/08/2020   K 4.2 05/08/2020   CL 107 05/08/2020   CO2 25 05/08/2020   Lab  Results  Component Value Date   ALT 23 05/01/2020   AST 84 (H) 05/01/2020   ALKPHOS 61 05/01/2020   BILITOT 1.6 (H) 05/01/2020   Lab Results  Component Value Date   CHOL 99 05/01/2020   HDL 29 (L) 05/01/2020   LDLCALC 32 05/01/2020   TRIG 190 (H) 05/01/2020   CHOLHDL 3.4 05/01/2020    Lab Results  Component Value Date   HGBA1C 5.6 05/03/2020   Lab Results  Component Value Date   TSH 2.469 05/01/2020     STUDIES/PROCEDURES reviewed today   Intraoperative TEE during CABG 6/38/4536 Complications: No known complications during this procedure.  POST-OP IMPRESSIONS  - Left Ventricle: The left ventricle is unchanged from pre-bypass. The  cavity  size was normal.  - Right Ventricle: The right ventricle appears unchanged from pre-bypass.  - Aorta: The aorta appears unchanged from pre-bypass.  - Left Atrium: The left atrium appears unchanged from pre-bypass.  - Left Atrial Appendage: The left atrial appendage appears unchanged from  pre-bypass.  - Aortic Valve: The aortic valve appears unchanged from pre-bypass.  - Mitral Valve: There is mild regurgitation.  - Tricuspid Valve: The tricuspid valve appears unchanged from pre-bypass.  - Pulmonic Valve: The pulmonic valve appears unchanged from pre-bypass.  - Interatrial Septum: The interatrial septum appears unchanged from  pre-bypass.  - Interventricular Septum: The interventricular septum appears unchanged  from  pre-bypass.  - Pericardium: The pericardium appears unchanged from pre-bypass.  - Comments: Post-bypass images reviewed with surgeon.   Bilateral carotids 05/02/2020 Summary:  Right Carotid: Velocities in the right ICA are consistent with a 1-39%  stenosis.  Left Carotid:  Velocities in the left ICA are consistent with a 1-39%  stenosis.  Vertebrals: Bilateral vertebral arteries demonstrate antegrade flow.  Right Upper Extremity: Doppler waveforms remain within normal limits with  right radial compression. Doppler waveforms remain within normal limits  with right ulnar compression.  Left Upper Extremity: Doppler waveforms remain within normal limits with  left radial compression. Doppler waveforms decrease 50% with left ulnar  compression.     LHC 05/01/2020:  There is moderate left ventricular systolic dysfunction.  LV end diastolic pressure is mildly elevated.  The left ventricular ejection fraction is 35-45% by visual estimate.  Ost RCA to Prox RCA lesion is 60% stenosed.  Mid RCA to Dist RCA lesion is 100% stenosed.  Ost LM to Dist LM lesion is 80% stenosed.  Ost Cx to Prox Cx lesion is 80% stenosed.  Ost LAD to Prox LAD lesion is 80% stenosed.  1. 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 Significant pressure dampening with catheter engagement of the left main coronary artery. 2. Moderately reduced LV systolic function. Mildly elevated left ventricular end-diastolic pressure.  Recommendations: Recommend transfer to [facility] for CABG evaluation. Obtain an echocardiogram to better evaluate LV systolic function and valvular abnormalities. Resume heparin 6 hours after sheath pull.   Diagnostic Dominance: Right  Left Main  Ost LM to Dist LM lesion is 80% stenosed. The lesion is severely calcified.  Left Anterior Descending  Ost LAD to Prox LAD lesion is 80% stenosed. The lesion is eccentric. The lesion is severely calcified.  Left Circumflex  Ost Cx to Prox Cx lesion is 80% stenosed. The lesion is severely calcified.  Right Coronary Artery  Ost RCA to Prox RCA lesion is 60% stenosed. The lesion is severely calcified.  Mid RCA to Dist RCA lesion is 100% stenosed.  Right Posterior Atrioventricular Artery  Collaterals  RPAV filled by collaterals from RV Branch.    First Right Posterolateral Branch  Collaterals  1st RPL filled by collaterals from Dist LAD.     Intervention   No interventions have been documented.  Wall Motion       Resting                Left Heart  Left Ventricle The left ventricular size is normal. There is moderate left ventricular systolic dysfunction. LV end diastolic pressure is mildly elevated. The left ventricular ejection fraction is 35-45% by visual estimate. There are LV function abnormalities due to global hypokinesis.   Coronary Diagrams   Diagnostic Dominance: Right    Echo 05/02/20 1. Left ventricular ejection fraction, by estimation, is 40 to 45%. The  left ventricle has mildly decreased function. The left ventricle  demonstrates global hypokinesis. There is mild concentric left ventricular  hypertrophy. Indeterminate diastolic  filling due to E-A fusion. Elevated left ventricular end-diastolic  pressure.  2. Right ventricular systolic function is normal. The right ventricular  size is normal. There is mildly elevated pulmonary artery systolic  pressure. The estimated right ventricular systolic pressure is 19.5 mmHg.  3. Left atrial size was moderately dilated.  4. The mitral valve is normal in structure. Moderate mitral valve  regurgitation.  5. The aortic valve is grossly normal. Aortic valve regurgitation is not  visualized.  6. The inferior vena cava is normal in size with greater than 50%  respiratory variability, suggesting right atrial pressure of 3 mmHg.    Assessment & Plan    Coronary artery disease without angina Recent NSTEMI s/p catheterization with severe 3v CAD S/p 4/20 CABG (LIMA to LAD, SVG to PDA, sequential OM SVG to OM1) --No current chest pain.  Presented to [facility] with severe substernal chest pain and found to have new left bundle branch block with significantly elevated troponin.   Subsequent LHC showed severe three-vessel CAD with transfer to [facility] for CABG. Echo 4/19 with EF 40 to 45%, LV global hypokinesis, concentric LVH, elevated LVEDP, elevated RVSP, mildly elevated PASP.  Today, she returns to clinic with incision sites healing well and erythema/mild edema of lower extremity harvest site noted but not concerning for infection at this time.  Patient educated on signs of infection and will continue to monitor her lower extremity incision site moving forward with recommendation she call the office if any concerning symptoms, at which time we will put her on antibiotics to prevent infection.    Monitor lower extremity incision site closely as above.    Continue DAPT with ASA and Plavix, Toprol, statin.    Start losartan 25 mg daily today to be escalated as tolerated.    Obtain BMET and CBC for medication management.    Recheck electrolytes in the setting of recent diarrhea.  Escalation of GDMT at RTC if BP allows and pending recheck of renal function/electrolytes.  Repeat echo in approximately 3 months to reassess EF and once on optimized medications.    Aggressive risk factor modification discussed/CAD education provided.    Preliminary STOP-BANG assessment for sleep apnea below 3.  No watch pat provided today.  Cardiac rehab recommended -we will confirm cardiac rehab is aware of referral at discharge.   Gently increase activity as tolerated at home until able to start cardiac rehab.  Recommended walking with frequent breaks and to increase length of walking as tolerated.  Preferences for family to be present at the time of her activity  to ensure no falls and monitor her symptoms.  Right lower extremity incision erythema s/p R sided SVG vein harvest --Per patient, edema and erythema improving from immediately after CABG.  Today, erythema and edema noted with scab appearing as if it may have become loose with the friction from her pants and thus be etiology of  her current erythema and TTP. Given her report of improving symptoms and today's exam not concerning for infection, she will monitor for any worsening symptoms and call the office if needed.  No indication for antibiotics today.  Lung mass seen on CT --Lung mass seen during admission with patient report that she was not referred to pulmonology at discharge.  Patient requests information regarding when she should discontinue her oxygen.  Pulmonology referral provided with recommendation she continue oxygen until that time.  Will defer further recommendations to pulmonology.  Consider is contributing to mildly elevated PASP and right heart pressures on echo.  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  Echo 05/02/2020 with LVEF 40 to 45%, global hypokinesis, mild concentric hypertrophy with indeterminate diastolic filling due to E-A fusion, elevated LVEDP, moderate mitral regurgitation, RVSP 36.6 mmHg, mildly elevated pulmonary artery systolic pressure.  BNP elevated 646 during admission.  She is not currently on Lasix-will try to clarify the reason this medication has been discontinued since her admission; however, given volume status today, plan is to restart following BMET and if patient tolerates losartan either between visits or at RTC.   Restart Lasix after BMET and once we ensure she is tolerating her losartan.  For unclear reasons, she reportedly only received 3 pills of Lasix at discharge.  This was noted by her MD at [facility] on 5/3 on review of notes.  No reported symptoms of volume overload; however, suspect she will need restart between visits or at RTC.  Recheck BMET, especially in the setting of recent diarrhea.  Ensure renal function and electrolytes at goal.  Future considerations include restart of Lasix, addition of spironolactone, addition of SGLT2 inhibitor and to be determined at upcoming visits.    Repeat echo  in approximately 3 to 6 months or after optimize GDMT.  Mitral regurgitation, moderate by 05/02/2020 echo -- Continue to monitor valvular disease with periodic echo.  No reported symptoms of worsening valvular disease today.  Heart rate and blood pressure/volume control recommended.  HLD, LDL goal below 70 Hypertriglyceridemia -- LDL 32 and at goal of below 70.  Continue statin.  Repeat lipid and liver in 6 to 8 weeks.  If LDL not at goal, consider addition of Zetia +/- PCSK9 inhibitors/incliseran if needed in the future.  If triglycerides remain elevated, could consider Vascepa in the future.  HTN, goal BP less than 130/80 -- BP today well controlled.  Recommend she monitor BP at home, as well as daily weights.  During her admission, BP noted to be elevated.  Continue current Toprol.  Losartan 25 mg started today and to be escalated as tolerated.  Likely restart of Lasix before at RTC.  Future considerations include addition of spironolactone as well..  Reviewed recommendations for fluid/salt intake.  H/o PVCs/tachycardia --Asymptomatic.  Frequent PVCs and tachycardia seen on telemetry monitoring with up titration of beta-blocker during admission.  Continue current Toprol.  We will check BMET today to ensure electrolytes at goal.  PVD  S/p left common iliac artery stenting Mild bilateral carotid artery disease Left upper extremity decreased waveforms with ulnar compression --Mild carotid  dz on recent carotids 05/02/2020, performed in preparation for CABG.  She denies any amaurosis fugax or dizziness today.  R/L CA 1 -39% stenosed.  Doppler waveforms decreased 50% if left ulnar compression. S/p intervention to L common iliac artery with recommendation she follow-up with vascular surgery as recommended. She denies symptoms of claudication.  Recommend she continue to follow-up with vascular surgery as directed.  Risk factor modification recommended with control of LDL, Tg,  glucose.  Continue DAPT and  statin.  Anemia --Noted on recent labs.  Recheck CBC today on DAPT to ensure stable H&H.  Medication changes: Start losartan 25 mg daily.  Restart Lasix before at RTC. Labs ordered: BMET, CBC Studies / Imaging ordered: Pulmonology referral.  Cardiac rehab referral already placed at discharge. Future considerations: Echo once on optimized GDMT/3 to 6 months.  Restart of Lasix.  Addition of spironolactone/SGLT2 inhibitor..  Of note, suspect Reds vest less accurate in this patient given comorbid conditions. Disposition: RTC 2 weeks  *Please be aware that the above documentation was completed voice recognition software and may contain dictation errors.     Arvil Chaco, PA-C 05/22/2020

## 2020-05-22 NOTE — Patient Instructions (Addendum)
Medication Instructions:  Your physician has recommended you make the following change in your medication:   RESTART Losartan 25mg  daily  We have sent in refills for your Plavix  *If you need a refill on your cardiac medications before your next appointment, please call your pharmacy*   Lab Work:  Your physician recommends that you have lab work TODAY at the [place] [facility]: BMET, CBC  -  Please go to the [facility]. You will check in at the front desk to the right as you walk into the atrium.     Testing/Procedures: None ordered   Follow-Up: At [facility], you and your health needs are our priority.  As part of our continuing mission to provide you with exceptional heart care, we have created designated Provider Care Teams.  These Care Teams include your primary Cardiologist (physician) and Advanced Practice Providers (APPs -  Physician Assistants and Nurse Practitioners) who all work together to provide you with the care you need, when you need it.  We recommend signing up for the patient portal called "MyChart".  Sign up information is provided on this After Visit Summary.  MyChart is used to connect with patients for Virtual Visits (Telemedicine).  Patients are able to view lab/test results, encounter notes, upcoming appointments, etc.  Non-urgent messages can be sent to your provider as well.   To learn more about what you can do with MyChart, go to [email or web address].    Your next appointment:   2 - 3 week(s)  The format for your next appointment:   In Person  Provider:   You may see Dr. Fletcher Anon or one of the following Advanced Practice Providers on your designated Care Team:     Marrianne Mood, PA-C    Other Instructions  The number for cardiac rehab is [phone]  We have referred you to pulmonology.   Melatonin will help you fall to sleep. The lowest effective dose should be used going forward.

## 2020-05-25 ENCOUNTER — Other Ambulatory Visit: Payer: Self-pay | Admitting: Thoracic Surgery (Cardiothoracic Vascular Surgery)

## 2020-05-25 DIAGNOSIS — Z951 Presence of aortocoronary bypass graft: Secondary | ICD-10-CM

## 2020-05-30 ENCOUNTER — Ambulatory Visit
Admission: RE | Admit: 2020-05-30 | Discharge: 2020-05-30 | Disposition: A | Discharge status: Home / Self Care | Payer: Medicare Other | Source: Ambulatory Visit | Attending: Thoracic Surgery (Cardiothoracic Vascular Surgery) | Admitting: Thoracic Surgery (Cardiothoracic Vascular Surgery)

## 2020-05-30 ENCOUNTER — Other Ambulatory Visit: Payer: Self-pay | Admitting: *Deleted

## 2020-05-30 ENCOUNTER — Other Ambulatory Visit: Payer: Self-pay

## 2020-05-30 ENCOUNTER — Ambulatory Visit (INDEPENDENT_AMBULATORY_CARE_PROVIDER_SITE_OTHER): Payer: Self-pay | Admitting: Thoracic Surgery (Cardiothoracic Vascular Surgery)

## 2020-05-30 ENCOUNTER — Encounter: Payer: Self-pay | Admitting: Thoracic Surgery (Cardiothoracic Vascular Surgery)

## 2020-05-30 VITALS — BP 174/78 | HR 100 | Resp 20 | Ht 60.0 in | Wt 119.0 lb

## 2020-05-30 DIAGNOSIS — R918 Other nonspecific abnormal finding of lung field: Secondary | ICD-10-CM

## 2020-05-30 DIAGNOSIS — Z951 Presence of aortocoronary bypass graft: Secondary | ICD-10-CM

## 2020-05-30 DIAGNOSIS — J984 Other disorders of lung: Secondary | ICD-10-CM

## 2020-05-30 NOTE — Progress Notes (Signed)
[name]       ,[name]     HPI: [name] returns for a scheduled postoperative follow-up after coronary artery bypass grafting  [name] is a 80 year old woman with a history of hypertension, hyperlipidemia, PAD, iliac artery stent, ductal carcinoma in situ of left breast, and remote tobacco abuse.  She presented with chest pain and ruled in for non-ST elevation MI.  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  She underwent coronary bypass grafting x4 on 05/03/2020.  She did well postoperatively but did require some oxygen.  She went home on postoperative day #6.  Since discharge she has been doing well.  She denies taking any pain medication.  She never took tramadol.  She took Tylenol initially but is not using that now.  She does have some mild incisional soreness.  She has not had any recurrent anginal symptoms.  She also denies fever, chills, sweats, and cough.  She went home on oxygen but is not using it currently.  Past Medical History:  Diagnosis Date  . Arthritis   . Breast cancer ([place]) 2011   radiation- Left  . Hypertension   . Personal history of radiation therapy   . PVD (peripheral vascular disease) ([place])     Current Outpatient Medications  Medication Sig Dispense Refill  . acetaminophen (TYLENOL) 500 MG tablet Take 1,000 mg by mouth every 6 (six) hours as needed for mild pain.    Marland Kitchen aspirin EC 81 MG tablet Take 81 mg by mouth daily.    Marland Kitchen atorvastatin (LIPITOR) 20 MG tablet Take 2 tablets (40 mg total) by mouth at bedtime. 60 tablet 0  . Calcium Carbonate-Vit D-Min (CALTRATE 600+D PLUS PO) Take 1 tablet by mouth daily.     . clopidogrel (PLAVIX) 75 MG tablet Take 1 tablet (75 mg total) by mouth daily. 90 tablet 3  . losartan (COZAAR) 25 MG tablet Take 1 tablet (25 mg total) by mouth daily. 90 tablet 3  . metoprolol succinate (TOPROL-XL) 50 MG 24 hr tablet Take 1 tablet (50  mg total) by mouth daily. Take with or immediately following a meal. 30 tablet 1   No current facility-administered medications for this visit.    Physical Exam BP (!) 174/78   Pulse 100   Resp 20   Ht 5' (1.524 m)   Wt 119 lb (54 kg)   SpO2 92% Comment: RA  BMI 23.14 kg/m  80 year old woman in no acute distress Alert and oriented x3 with no focal deficits Lungs clear with equal breath sounds bilaterally Cardiac regular rate and rhythm Sternum stable, incision intact Leg incision healing well No peripheral edema  Diagnostic Tests: I personally reviewed her chest x-ray.  Shows postoperative changes.  The left lower lobe lung mass is smaller and more cavitary.  Impression: [name] is a 80 year old woman  with a history of hypertension, hyperlipidemia, PAD, iliac artery stent, ductal carcinoma in situ of left breast, and remote tobacco abuse.  She presented with a non-ST elevation MI and was found to have left main and three-vessel disease at catheterization.  She underwent coronary bypass grafting x4 on 05/03/2020.  Her postoperative course was relatively unremarkable but she did go home on oxygen.  She is doing well at this point time.  She has minimal pain.  She is not taking any narcotics.  She feels like  she is significantly stronger over the past week than she was previously.  She may begin driving on a limited basis.  Appropriate precautions were discussed.  She should not lift anything over 10 pounds for another 2 weeks or anything over 20 pounds for another 4 weeks.  Home oxygen-oxygen saturation 92% on room air after walking.  I will think she needs oxygen at this point.  Cavitary left lower lobe lung mass-she had a CT while she was in the hospital which showed a very unusual appearing lesion in the left lower lobe.  There appeared to be a central hypodense area either mass or abscess surrounded by atelectatic lung.  On her chest x-ray today the overall size of the lesion  is smaller.  It is now cavitary.  The decrease in size may just be due to the surrounding lung not being atelectatic anymore.  I recommended that we repeat a CT scan in about a month.  We can move forward from there depending on the findings.  Plan: Return in 1 month with CT of chest  Melrose Nakayama, MD [facility] [phone]

## 2020-06-14 ENCOUNTER — Ambulatory Visit: Payer: Medicare Other | Admitting: Physician Assistant

## 2020-06-22 ENCOUNTER — Encounter: Payer: Self-pay | Admitting: Physician Assistant

## 2020-06-22 ENCOUNTER — Ambulatory Visit (INDEPENDENT_AMBULATORY_CARE_PROVIDER_SITE_OTHER): Payer: Medicare Other | Admitting: Physician Assistant

## 2020-06-22 ENCOUNTER — Other Ambulatory Visit: Payer: Self-pay

## 2020-06-22 VITALS — BP 150/70 | HR 92 | Ht 60.0 in | Wt 117.1 lb

## 2020-06-22 DIAGNOSIS — I739 Peripheral vascular disease, unspecified: Secondary | ICD-10-CM

## 2020-06-22 DIAGNOSIS — I10 Essential (primary) hypertension: Secondary | ICD-10-CM

## 2020-06-22 DIAGNOSIS — D649 Anemia, unspecified: Secondary | ICD-10-CM

## 2020-06-22 DIAGNOSIS — Z9189 Other specified personal risk factors, not elsewhere classified: Secondary | ICD-10-CM

## 2020-06-22 DIAGNOSIS — I252 Old myocardial infarction: Secondary | ICD-10-CM | POA: Diagnosis not present

## 2020-06-22 DIAGNOSIS — Z951 Presence of aortocoronary bypass graft: Secondary | ICD-10-CM | POA: Diagnosis not present

## 2020-06-22 DIAGNOSIS — I5020 Unspecified systolic (congestive) heart failure: Secondary | ICD-10-CM | POA: Diagnosis not present

## 2020-06-22 DIAGNOSIS — E785 Hyperlipidemia, unspecified: Secondary | ICD-10-CM

## 2020-06-22 DIAGNOSIS — I2510 Atherosclerotic heart disease of native coronary artery without angina pectoris: Secondary | ICD-10-CM

## 2020-06-22 DIAGNOSIS — I493 Ventricular premature depolarization: Secondary | ICD-10-CM

## 2020-06-22 DIAGNOSIS — E781 Pure hyperglyceridemia: Secondary | ICD-10-CM

## 2020-06-22 DIAGNOSIS — R918 Other nonspecific abnormal finding of lung field: Secondary | ICD-10-CM

## 2020-06-22 DIAGNOSIS — I447 Left bundle-branch block, unspecified: Secondary | ICD-10-CM

## 2020-06-22 DIAGNOSIS — I6523 Occlusion and stenosis of bilateral carotid arteries: Secondary | ICD-10-CM

## 2020-06-22 MED ORDER — FUROSEMIDE 20 MG PO TABS: 20.0000 mg | ORAL_TABLET | Freq: Every day | ORAL | 3 refills | Status: DC | PRN

## 2020-06-22 MED ORDER — ATORVASTATIN CALCIUM 20 MG PO TABS: 20.0000 mg | ORAL_TABLET | Freq: Every evening | ORAL | 3 refills | Status: AC

## 2020-06-22 MED ORDER — METOPROLOL SUCCINATE ER 50 MG PO TB24: 50.0000 mg | ORAL_TABLET | Freq: Two times a day (BID) | ORAL | 3 refills | Status: DC

## 2020-06-22 NOTE — Progress Notes (Signed)
Office Visit    Patient Name: [name] Date of Encounter: 06/22/2020  PCP:  Derinda Late, MD   [place]  Cardiologist: Dr. Garen Lah Advanced Practice Provider:  No care team member to display Electrophysiologist:  None :[phone]}   Chief Complaint    Chief Complaint  Patient presents with   3-week follow up     "Doing well." Medications reviewed by the patient verbally.       80 y.o. female with history of CAD and CABG, NSTEMI, LBBB, peripheral vascular disease s/p intervention of the left common iliac artery in 2000, left-sided breast cancer s/p lumpectomy and radiation, hypertension, hyperlipidemia, prior tobacco use at 1 pack/day with quit date over 20 years prior, and seen for 3 wk follow-up.  Past Medical History    Past Medical History:  Diagnosis Date   Arthritis    Breast cancer ([place]) 2011   radiation- Left   Hypertension    Personal history of radiation therapy    PVD (peripheral vascular disease) ([place])    Past Surgical History:  Procedure Laterality Date   ABDOMINAL HYSTERECTOMY     BREAST BIOPSY Left 12/21/2009   positive, radiation dcis   BREAST EXCISIONAL BIOPSY Left 01/17/2010   lumpectomy DCIS   BREAST LUMPECTOMY     CATARACT EXTRACTION W/ INTRAOCULAR LENS IMPLANT & ANTERIOR VITRECTOMY, BILATERAL     CHOLECYSTECTOMY     COLONOSCOPY WITH PROPOFOL N/A 09/25/2017   Procedure: COLONOSCOPY WITH PROPOFOL;  Surgeon: Lollie Sails, MD;  Location: [facility] ENDOSCOPY;  Service: Endoscopy;  Laterality: N/A;   CORONARY ARTERY BYPASS GRAFT N/A 05/03/2020   Procedure: CORONARY ARTERY BYPASS GRAFTING (CABG) X FOUR ON PUMP USING LEFT INTERNAL MAMMARY ARTERY AND RIGHT ENDOSCOPIC SAPHEOUS VEIN HARVEST CONDUITS;  Surgeon: Melrose Nakayama, MD;  Location: [place];  Service: Open Heart Surgery;  Laterality: N/A;   DILATION AND CURETTAGE OF UTERUS     JOINT REPLACEMENT Right 01/24/2016   KNEE ARTHROPLASTY Right 01/24/2016    Procedure: COMPUTER ASSISTED TOTAL KNEE ARTHROPLASTY;  Surgeon: Dereck Leep, MD;  Location: [facility] ORS;  Service: Orthopedics;  Laterality: Right;   LEFT HEART CATH AND CORONARY ANGIOGRAPHY N/A 05/01/2020   Procedure: LEFT HEART CATH AND CORONARY ANGIOGRAPHY;  Surgeon: Wellington Hampshire, MD;  Location: [place] [facility] LAB;  Service: Cardiovascular;  Laterality: N/A;   stent in Lt leg Left    TEE WITHOUT CARDIOVERSION N/A 05/03/2020   Procedure: TRANSESOPHAGEAL ECHOCARDIOGRAM (TEE);  Surgeon: Melrose Nakayama, MD;  Location: [place];  Service: Open Heart Surgery;  Laterality: N/A;    Allergies  Allergies  Allergen Reactions   Ibuprofen Hives   Penicillins Hives    Tolerated Ancef in the past   Aleve [Naproxen Sodium] Hives   Avapro  [Irbesartan] Rash    Other reaction(s): Weal   Azithromycin Hives   Nickel Itching    Redness, itching and fluid discharge with nickel earrings   Other Other (See Comments)    Nickel"ears ran fluid and itching" redness and "sensitive" Nickel"ears ran fluid and itching"    History of Present Illness    [name] is a 80 y.o. female with PMH as above.  Past history includes PVD with history of stenting of left common iliac artery in 2000, left-sided breast cancer s/p lumpectomy and radiation, hypertension, hyperlipidemia, prior tobacco use at 1 pack/day and quitting over 20 years prior, and recent admission with new left bundle branch block and NSTEMI with  catheterization and CABG as below.  Prior to most recent admission, she did not have a history of cardiovascular disease to her knowledge.  She was not following with a cardiologist.  On 04/30/2020, she presented to [facility] with report of a several week history of progressive exertional angina. The night before her presentation (4/16), she developed severe substernal chest pain with left arm radiation and associated nausea and emesis.  EKG showed new left bundle branch block when compared with prior  tracings. HS Tn 19,201 with delta troponin of 14,653.  BNP 646.  Chest x-ray showed pulmonary mass in posterior left middle lung. CTA without evidence of pulmonary embolism with a rounded consolidation in the left lower lobe surrounding an apparent 3.3 x 2.5 x 2.5 cm hypoenhancing lesion, as well as small left and trace right pleural effusions.  Mild cardiomegaly with suspected interstitial edema findings noted, consistent with bronchitis or reactive airway disease.  Nodular contour of left hepatic lobe noted, suggestive of cirrhosis and aortic atherosclerosis.  She underwent LHC 4/18 with severe calcification of the left main and three-vessel CAD as outlined below.  Significant pressure dampening was noted with catheter engagement of left main coronary artery.  She had moderately reduced LVSF with EF 35 to 40% by LV gram and mildly elevated LVEDP.  Echo EF 40 to 45% with global hypokinesis and moderate mitral regurgitation.   On 4/20, she underwent CABG with LIMA to LAD, SVG to PDA, and sequential OM, SVG to OM1.    She was continued on ASA and Plavix, BB, Lipitor, as well as Lasix 40 mg daily.  Addition of spironolactone recommended at RTC/escalation of GDMT as tolerated. Her BP was elevated throughout admission with metoprolol consolidated to Toprol at discharge.  It was recommended losartan be added at RTC.  Frequent PVCs were noted on telemetry monitoring with patient asymptomatic but tachycardic on telemetry and reassessment recommended RTC. Follow-up with vascular surgery was recommended for PVD.  Seen 05/22/20 with left-sided twinge reported, brief in duration and self resolving. Pain not consistent with before her LHC. She noted some swelling and pain associated with her right lower extremity incision for vessel harvest. She had fatigue and was not sleeping well with poor sleep hygiene, usually sleeping  3 AM to 6 AM and in a recliner, given her husband used to sleep in this recliner. She did not sleep  in this recliner for sx of orthopnea, reported she slept there to feel close to her husband after his passing away.  She was not taking lasix. She was fairly sedentary and had not yet started cardiac rehab but agreeable to slow escalation of walking around the house as tolerated with family supervision during that time, given family was now planning to start to ease up on 24/7 monitoring of patient now that she has had her first cardiology follow-up.She was going to follow-up with pulmonology regarding oxygen and lung mass seen on CT during admission.  Questions asked about oxygen deferred to pulmonology. Education provided about CAD, HF, and RF modification. She was agreeable to monitor her SVT graft incision site. She was started on Losartan and restarted on lasix.  Today, 06/22/20, she returns to clinic and states she is doing well from a cardiac standpoint.  She denies any further left-sided twinge or chest pain.  Her right lower extremity incision has healed well and she denies any further erythema or pain associated with the site.  Her CABG sternal incision is healing well, though slightly still tender.  No numbness associated with the incision site.  She reports improvement in her sleeping, though still not sleeping at regular hours and in a recliner chair so as to feel close to her husband.  She is still not taking Lasix, though weight has decreased from her previous clinic visit and she is relatively euvolemic on exam.  She has been increasing activity and reports that she walks around her backyard several times and until feeling tired.  She denies any exertional symptoms with these walks.  She is uncertain of how far she walks before becoming tired.  She declines cardiac rehab today but agreeable to reach out to the clinic if she is interested in starting this before her next clinic visit.  She reports that her family needs to drive her to visits, and she does not want to bother them with frequent cardiac  rehab visits.  She reports successful completion of ADLs.  She has not followed up with pulmonology regarding her lung mass but reports that this will be addressed at her upcoming CTS visit.  She reports poor medication compliance, as well as a desire to come off of all her medications.  She is frustrated that her medications changed during her admission from those of previous cardiac medications.  She is frustrated regarding the number of medications she takes and unclear on the indications for each.  She expresses concern regarding cost of medications.  All indications for all her medications reviewed in detail, as well as the reason for guideline directed medical therapy given her HFrEF and CAD/CABG.  Medications explained as being part of guideline directed medical therapy to prevent recurrence of her previous need for cardiac intervention/hospitalization.  She does not wish to escalate GDMT today or add additional medications to her current regimen.  She ran out of atorvastatin sometime ago and has not refilled this medication.  After a long discussion, she is agreeable to refill atorvastatin.  She is not taking Lasix, and this medication has fallen off of her list.  She does not wish to restart it but agreeable to add the medication back on her list as as needed.  She denies any signs or symptoms of bleeding.  Home BP reviewed with SBP 1 30-1 40s and DBP 60s to 70s with review of recommendation for BP 130/80 or lower.  Today's BP 150/70, which we discussed is suboptimal.  She attributes his elevated BP to the walk-in to the clinic.  Recheck of BP after 1 hour of rest shows BP still elevated and 150s over 70s with elevated heart rate and recommendation to increase guideline directed medical therapy.  Given she does not want to increase the number of medications she is taking, Toprol increased as directly below.  She reports a desire to have less frequent cardiac visits.  Home Medications   Current  Outpatient Medications  Medication Instructions   acetaminophen (TYLENOL) 1,000 mg, Oral, Every 6 hours PRN   aspirin EC 81 mg, Oral, Daily   atorvastatin (LIPITOR) 20 mg, Oral, Nightly   Calcium Carbonate-Vit D-Min (CALTRATE 600+D PLUS PO) 1 tablet, Oral, Daily   clopidogrel (PLAVIX) 75 mg, Oral, Daily   furosemide (LASIX) 20 mg, Oral, Daily PRN, For weight gain greater than 3 lbs overnight or 5 lbs in one week.   losartan (COZAAR) 25 mg, Oral, Daily   metoprolol succinate (TOPROL-XL) 50 mg, Oral, 2 times daily, Take with or immediately following a meal.     Review of Systems    She reports  resolution of her previous left-sided twinge and resolution of previous edema of her right lower extremity incision site.  She continues to note poor sleep hygiene, sleeping at odd hours and in a recliner to feel close to her husband (not due to orthopnea), though she is sleeping better when compared with previous clinic visits.  She notes frequent urination.  No, palpitations, pnd, v, presyncope, syncope, weight gain, or early satiety.  She expresses a desire to come off of her medications and for less frequent cardiac visit.  All other systems reviewed and are otherwise negative except as noted above.  Physical Exam    VS:  BP (!) 150/70 (BP Location: Left Arm, Patient Position: Sitting, Cuff Size: Normal)   Pulse 92   Ht 5' (1.524 m)   Wt 117 lb 2 oz (53.1 kg)   SpO2 98%   BMI 22.87 kg/m  , BMI Body mass index is 22.87 kg/m. GEN: Elderly female, no acute distress.  Mask in place. HEENT: normal. Neck: Supple, no JVD, carotid bruits, or masses. Cardiac: RRR, 1/6 systolic murmur LLSB.  No rubs, or gallops. No clubbing, cyanosis.  Sternal incision site has healed well without any evidence of infection or erythema.  Right lower extremity harvest site now well-healed and without evidence of erythema or infection.  Pedal/radial pulses 2+ bilaterally.  No bilateral lower extremity edema. Respiratory:   Respirations regular and unlabored.  Bilateral rhonchi, cleared with cough, and with patient report of recent allergies/flareup of allergies. GI: Soft, nontender, nondistended, BS + x 4. MS: no deformity or atrophy.  As above, incision site at right lower extremity well-healed. Skin: warm and dry, no rash. Neuro:  Strength and sensation are intact. Psych: Normal affect.  Accessory Clinical Findings    ECG personally reviewed by me today - NSR, 92 bpm, left bundle branch block, poor R wave in III, avF, T wave inversion noted in 1, 2, V5-V6, Qtc 507- no acute changes.  VITALS Reviewed today   Temp Readings from Last 3 Encounters:  05/09/20 98.2 F (36.8 C) (Oral)  05/01/20 98.6 F (37 C) (Oral)  09/25/17 (!) 97.2 F (36.2 C)   BP Readings from Last 3 Encounters:  06/22/20 (!) 150/70  05/30/20 (!) 174/78  05/22/20 130/70   Pulse Readings from Last 3 Encounters:  06/22/20 92  05/30/20 100  05/22/20 77    Wt Readings from Last 3 Encounters:  06/22/20 117 lb 2 oz (53.1 kg)  05/30/20 119 lb (54 kg)  05/22/20 120 lb (54.4 kg)     LABS  reviewed today   Lab Results  Component Value Date   WBC 4.9 05/22/2020   HGB 10.7 (L) 05/22/2020   HCT 32.6 (L) 05/22/2020   MCV 98.8 05/22/2020   PLT 256 05/22/2020   Lab Results  Component Value Date   CREATININE 0.55 05/22/2020   BUN 8 05/22/2020   NA 134 (L) 05/22/2020   K 4.1 05/22/2020   CL 100 05/22/2020   CO2 26 05/22/2020   Lab Results  Component Value Date   ALT 23 05/01/2020   AST 84 (H) 05/01/2020   ALKPHOS 61 05/01/2020   BILITOT 1.6 (H) 05/01/2020   Lab Results  Component Value Date   CHOL 99 05/01/2020   HDL 29 (L) 05/01/2020   LDLCALC 32 05/01/2020   TRIG 190 (H) 05/01/2020   CHOLHDL 3.4 05/01/2020    Lab Results  Component Value Date   HGBA1C 5.6 05/03/2020   Lab Results  Component  Value Date   TSH 2.469 05/01/2020     STUDIES/PROCEDURES reviewed today   Intraoperative TEE during  CABG 8/33/8250 Complications: No known complications during this procedure.  POST-OP IMPRESSIONS  - Left Ventricle: The left ventricle is unchanged from pre-bypass. The  cavity  size was normal.  - Right Ventricle: The right ventricle appears unchanged from pre-bypass.  - Aorta: The aorta appears unchanged from pre-bypass.  - Left Atrium: The left atrium appears unchanged from pre-bypass.  - Left Atrial Appendage: The left atrial appendage appears unchanged from  pre-bypass.  - Aortic Valve: The aortic valve appears unchanged from pre-bypass.  - Mitral Valve: There is mild regurgitation.  - Tricuspid Valve: The tricuspid valve appears unchanged from pre-bypass.  - Pulmonic Valve: The pulmonic valve appears unchanged from pre-bypass.  - Interatrial Septum: The interatrial septum appears unchanged from  pre-bypass.  - Interventricular Septum: The interventricular septum appears unchanged  from  pre-bypass.  - Pericardium: The pericardium appears unchanged from pre-bypass.  - Comments: Post-bypass images reviewed with surgeon.   Bilateral carotids 05/02/2020 Summary:  Right Carotid: Velocities in the right ICA are consistent with a 1-39%  stenosis.  Left Carotid: Velocities in the left ICA are consistent with a 1-39%  stenosis.  Vertebrals: Bilateral vertebral arteries demonstrate antegrade flow.  Right Upper Extremity: Doppler waveforms remain within normal limits with  right radial compression. Doppler waveforms remain within normal limits  with right ulnar compression.  Left Upper Extremity: Doppler waveforms remain within normal limits with  left radial compression. Doppler waveforms decrease 50% with left ulnar  compression.      LHC 05/01/2020: There is moderate left ventricular systolic dysfunction. LV end diastolic pressure is mildly elevated. The left ventricular ejection fraction is 35-45% by visual estimate. Ost RCA to Prox RCA lesion is 60% stenosed. Mid RCA to  Dist RCA lesion is 100% stenosed. Ost LM to Dist LM lesion is 80% stenosed. Ost Cx to Prox Cx lesion is 80% stenosed. Ost LAD to Prox LAD lesion is 80% stenosed.   1.  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  Significant pressure dampening with catheter engagement of the left main coronary artery. 2.  Moderately reduced LV systolic function.  Mildly elevated left ventricular end-diastolic pressure.   Recommendations: Recommend transfer to [facility] for CABG evaluation. Obtain an echocardiogram to better evaluate LV systolic function and valvular abnormalities. Resume heparin 6 hours after sheath pull.     Diagnostic Dominance: Right   Left Main  Ost LM to Dist LM lesion is 80% stenosed. The lesion is severely calcified.  Left Anterior Descending  Ost LAD to Prox LAD lesion is 80% stenosed. The lesion is eccentric. The lesion is severely calcified.  Left Circumflex  Ost Cx to Prox Cx lesion is 80% stenosed. The lesion is severely calcified.  Right Coronary Artery  Ost RCA to Prox RCA lesion is 60% stenosed. The lesion is severely calcified.  Mid RCA to Dist RCA lesion is 100% stenosed.  Right Posterior Atrioventricular Artery  Collaterals  RPAV filled by collaterals from RV Branch.     First Right Posterolateral Branch  Collaterals  1st RPL filled by collaterals from Dist LAD.       Intervention     No interventions have been documented.   Wall Motion        Resting                     Left Heart  Left Ventricle The left ventricular size is normal. There is moderate left ventricular systolic dysfunction. LV end diastolic pressure is mildly elevated. The left ventricular ejection fraction is 35-45% by visual estimate. There are LV function abnormalities due to global hypokinesis.    Coronary Diagrams     Diagnostic Dominance: Right      Echo 05/02/20  1. Left ventricular ejection fraction, by estimation, is 40 to 45%. The  left  ventricle has mildly decreased function. The left ventricle  demonstrates global hypokinesis. There is mild concentric left ventricular  hypertrophy. Indeterminate diastolic  filling due to E-A fusion. Elevated left ventricular end-diastolic  pressure.   2. Right ventricular systolic function is normal. The right ventricular  size is normal. There is mildly elevated pulmonary artery systolic  pressure. The estimated right ventricular systolic pressure is 81.8 mmHg.   3. Left atrial size was moderately dilated.   4. The mitral valve is normal in structure. Moderate mitral valve  regurgitation.   5. The aortic valve is grossly normal. Aortic valve regurgitation is not  visualized.   6. The inferior vena cava is normal in size with greater than 50%  respiratory variability, suggesting right atrial pressure of 3 mmHg.    Assessment & Plan    Coronary artery disease without angina Recent NSTEMI s/p catheterization with severe 3v CAD S/p 4/20 CABG (LIMA to LAD, SVG to PDA, sequential OM SVG to OM1) --No recent sx concerning for angina.  3v CAD with 4/20 CABG.  EF 40 to 45%, LV global hypokinesis, concentric LVH, elevated LVEDP, elevated RVSP, mildly elevated PASP. Reports a desire to come off of all medications and reduce follow-up visits today as outlined in HPI above.     Continue DAPT with ASA and Plavix, Toprol, ARB, statin.   She ran out of her atorvastatin but agreeable to refill this medication. Escalation of GDMT recommended with pt declining addition of spironolactone or transition to [facility]. Increased BB for BP/HR support, given resistance to escalate GDMT. Repeat echo to reassess EF and once on optimized medications.   Agreeable to schedule this echo today. Aggressive risk factor modification discussed/CAD education provided.   Cardiac rehab recommended, declined by pt.  Agreeable to continue to gently increase activity as tolerated at home. Will call the office if she changes  her mind.  Lung mass seen on CT --Lung mass seen during admission.  Pulmonology referral provided at last visit with pt report she will follow-up with CTS regarding her mass.  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  Echo 05/02/2020 with LVEF 40 to 45%, global hypokinesis, mild concentric hypertrophy with indeterminate diastolic filling due to E-A fusion, elevated LVEDP, moderate mitral regurgitation, RVSP 36.6 mmHg, mildly elevated pulmonary artery systolic pressure.  BNP elevated 646 during admission.  She is not currently on Lasix and does not wish to start this medication but agreeable to add back to her list as a PRN medication.  Restart PRN lasix - She does not wish to fill this medication but agreeable to add back to her medication list with education provided regarding sx of volume overload and monitoring wt and BP at home. Sliding scale instructions provided in AVS. She will fill her lasix if she changes her mind. Declines escalation of GDMT today. Discussed addition of spironolactone, addition of SGLT2 inhibitor.  Pt declined all changes today/escalation of GDMT but agreeable to increase her metoprolol dose for HR/BP support. Increased to Toprol 50mg   BID (total Toprol 100mg  daily). Repeat echo scheduled to reassess EF after recent CABG/medical therapy.  Mitral regurgitation, moderate by 05/02/2020 echo -- Continue to monitor valvular disease with periodic echo.  No reported symptoms of worsening valvular disease today.  Heart rate and blood pressure/volume control recommended. Both HR and BP control suboptimal today, discussed at length.  Declines any additional medications today but agreeable to increase dose of Toprol to 100 mg daily.  Will update echo as above to reassess EF after medical therapy and valvular function.  HLD, LDL goal below 70 Hypertriglyceridemia -- LDL 32 and at goal of below 70.  She ran out  of her atorvastatin and did not refill it but agreeable to restart this medication.  HTN, goal BP less than 130/80 -- BP today not well controlled with patient declining additional medications but agreeable to increase dose of Toprol to 100 mg daily.  Losartan 25 mg started at her last clinic visit.  She does not wish to restart Lasix but agreeable to add this back to her medication list as a as needed medication with signs and symptoms of volume overload reviewed, as well as recommendations regarding salt and fluid restrictions.  Sliding scale instructions reviewed.  She will fill this medication (Lasix) if she notes any symptoms of volume overload.  Reassess if agreeable to start spironolactone at RTC.    H/o PVCs/tachycardia --Asymptomatic.  Frequent PVCs and tachycardia seen on telemetry monitoring with up titration of beta-blocker during admission.  Given elevated heart rate and blood pressure today, increase to Toprol 100 mg daily.    PVD  S/p left common iliac artery stenting Mild bilateral carotid artery disease Left upper extremity decreased waveforms with ulnar compression --Mild carotid dz on recent carotids 05/02/2020, performed in preparation for CABG.  She denies any amaurosis fugax or dizziness today.  R/L CA 1 -39% stenosed.  Doppler waveforms decreased 50% if left ulnar compression. S/p intervention to L common iliac artery with recommendation she follow-up with vascular surgery as recommended. She denies symptoms of claudication.  Recommend she continue to follow-up with vascular surgery as directed.  Risk factor modification recommended with control of LDL, Tg,  glucose.  Continue DAPT and statin.  Anemia --Noted on recent labs.  Discussed again with patient today.  Medication changes: Restart atorvastatin/refills for atorvastatin provided today.  Increased to Toprol 100 mg daily.  As needed Lasix added to medication list with sliding scale regimen discussed.  Patient declines  further escalation of GDMT today, including spironolactone/SGLT2 inhibitor. Labs ordered: None Studies / Imaging ordered: Repeat echo s/p medical management scheduled.  She declines cardiac rehab.  Imaging of PVD per vascular surgery.  Work-up for pulmonary mass per CTS. Future considerations: Echo (scheduled today), addition of spironolactone/SGLT2 inhibitor.  Disposition: RTC 2 mo (pt preference is to follow up at 2 months over that of 1 month)  *Please be aware that the above documentation was completed voice recognition software and may contain dictation errors.     Arvil Chaco, PA-C 06/22/2020

## 2020-06-22 NOTE — Patient Instructions (Signed)
Medication Instructions:  Your physician has recommended you make the following change in your medication:   START Furosemide (Lasix) 20mg  daily AS NEEDED    - Take Lasix 20mg  for weight gain greater than 3lbs in one day or 5lbs in one week   - Your weight today in the office was 117.2lbs.    - Please weight yourself daily at home first thing every morning and keep a log of the weights.   INCREASE Metoprolol Succinate 50mg  TWICE daily - Take one tablet in the morning and the second tablet right before you go to bed   *If you need a refill on your cardiac medications before your next appointment, please call your pharmacy*   Lab Work:  None ordered  Testing/Procedures:  Your physician has requested that you have an echocardiogram in September or October 2022. Echocardiography is a painless test that uses sound waves to create images of your heart. It provides your doctor with information about the size and shape of your heart and how well your heart's chambers and valves are working. This procedure takes approximately one hour. There are no restrictions for this procedure.    Follow-Up: At [facility], you and your health needs are our priority.  As part of our continuing mission to provide you with exceptional heart care, we have created designated Provider Care Teams.  These Care Teams include your primary Cardiologist (physician) and Advanced Practice Providers (APPs -  Physician Assistants and Nurse Practitioners) who all work together to provide you with the care you need, when you need it.  We recommend signing up for the patient portal called "MyChart".  Sign up information is provided on this After Visit Summary.  MyChart is used to connect with patients for Virtual Visits (Telemedicine).  Patients are able to view lab/test results, encounter notes, upcoming appointments, etc.  Non-urgent messages can be sent to your provider as well.   To learn more about what you can do with  MyChart, go to [email or web address].    Your next appointment:   2 month(s)  The format for your next appointment:   In Person  Provider:   You may see one of the following Advanced Practice Providers on your designated Care Team:   Murray Hodgkins, NP Christell Faith, PA-C Marrianne Mood, PA-C Cadence [place], [place] Laurann Montana, NP   Other Instructions  If interested in Cardiac Rehab, give [place] a call.

## 2020-06-26 ENCOUNTER — Telehealth: Payer: Self-pay | Admitting: Physician Assistant

## 2020-06-26 NOTE — Telephone Encounter (Signed)
S/p CABG x 4 on 05/01/20.  Per discharge note oxygen set up post discharge.    Llc, [facility] Follow up.   Why: 3n1 and home 02 arranged- to be delivered to room prior to discharge Contact information: [address] [place] [address] [phone]    Provided pt with contact info above.  Pt appreciative and has no further needs at this time.

## 2020-06-26 NOTE — Telephone Encounter (Signed)
Patient is under the impression that we "prescribed" her oxygen and she would like to return it. Please advise

## 2020-06-27 ENCOUNTER — Ambulatory Visit
Admission: RE | Admit: 2020-06-27 | Discharge: 2020-06-27 | Disposition: A | Discharge status: Home / Self Care | Payer: Medicare Other | Source: Ambulatory Visit | Attending: Thoracic Surgery (Cardiothoracic Vascular Surgery) | Admitting: Thoracic Surgery (Cardiothoracic Vascular Surgery)

## 2020-06-27 ENCOUNTER — Other Ambulatory Visit: Payer: Self-pay

## 2020-06-27 ENCOUNTER — Ambulatory Visit (INDEPENDENT_AMBULATORY_CARE_PROVIDER_SITE_OTHER): Payer: Self-pay | Admitting: Thoracic Surgery (Cardiothoracic Vascular Surgery)

## 2020-06-27 VITALS — BP 160/74 | HR 96 | Resp 20 | Ht 60.0 in | Wt 117.0 lb

## 2020-06-27 DIAGNOSIS — J984 Other disorders of lung: Secondary | ICD-10-CM

## 2020-06-27 DIAGNOSIS — R918 Other nonspecific abnormal finding of lung field: Secondary | ICD-10-CM

## 2020-06-27 MED ORDER — IOPAMIDOL (ISOVUE-300) INJECTION 61%
75.0000 mL | Freq: Once | INTRAVENOUS | Status: AC | PRN
  Administered 2020-06-27: 75 mL via INTRAVENOUS

## 2020-06-27 NOTE — Progress Notes (Signed)
[address]       [place] [address]             [phone]      HPI: [name] returns for scheduled follow-up following coronary bypass grafting and in regards to a lung mass.  [name] is a 80 year old woman with history of hypertension, hyperlipidemia, PAD, iliac artery stent, ductal carcinoma in situ of the breast, and remote tobacco abuse.  She presented with chest pain and ruled in for non-ST elevation MI.  Catheterization showed severe left main and three-vessel disease.  She underwent coronary artery bypass grafting x4 on 05/03/2020.  She went home on oxygen, but has stopped using it.  While in the hospital she had a abnormal chest x-ray.  A CT of the chest showed a unusual appearing cavitary left lower lobe lung mass.  He was suspected to be abscess.  When she returned for a follow-up visit on 05/30/2020 the lesion was cavitary and smaller.  She feels well overall.  She says she still does not have all of her energy back.  She is anxious to start doing her own yard work and do more around the house.  She has not had any recurrent angina.  She does complain of a cough but no fevers or chills.  Past Medical History:  Diagnosis Date   Arthritis    Breast cancer ([place]) 2011   radiation- Left   Hypertension    Personal history of radiation therapy    PVD (peripheral vascular disease) ([place])     Current Outpatient Medications  Medication Sig Dispense Refill   acetaminophen (TYLENOL) 500 MG tablet Take 1,000 mg by mouth every 6 (six) hours as needed for mild pain.     aspirin EC 81 MG tablet Take 81 mg by mouth daily.     atorvastatin (LIPITOR) 20 MG tablet Take 1 tablet (20 mg total) by mouth at bedtime. 90 tablet 3   Calcium Carbonate-Vit D-Min (CALTRATE 600+D PLUS PO) Take 1 tablet by mouth daily.      clopidogrel (PLAVIX) 75 MG tablet Take 1 tablet (75 mg total) by mouth daily. 90 tablet 3   furosemide (LASIX) 20 MG tablet Take 1 tablet (20 mg total)  by mouth daily as needed. For weight gain greater than 3 lbs overnight or 5 lbs in one week. 30 tablet 3   losartan (COZAAR) 25 MG tablet Take 1 tablet (25 mg total) by mouth daily. 90 tablet 3   metoprolol succinate (TOPROL-XL) 50 MG 24 hr tablet Take 1 tablet (50 mg total) by mouth in the morning and at bedtime. Take with or immediately following a meal. 60 tablet 3   No current facility-administered medications for this visit.    Physical Exam BP (!) 160/74   Pulse 96   Resp 20   Ht 5' (1.524 m)   Wt 117 lb (53.1 kg)   SpO2 98% Comment: RA  BMI 22.92 kg/m  80 year old woman in no acute distress Alert and oriented x3 with no focal deficits Lungs diminished at left base otherwise clear Cardiac regular rate and rhythm Sternum stable, incision well-healed No peripheral edema  Diagnostic Tests: CT CHEST WITH CONTRAST   TECHNIQUE: Multidetector CT imaging of the chest was performed during intravenous contrast administration.   CONTRAST:  6mL ISOVUE-300 IOPAMIDOL (ISOVUE-300) INJECTION 61%   COMPARISON:  04/30/2020   FINDINGS: Cardiovascular: No acute findings. Aortic and coronary atherosclerotic calcification noted. Prior CABG noted.  Mediastinum/Nodes: Mildly enlarged bilateral hilar and mediastinal lymph nodes show no significant change. Calcified mediastinal lymph nodes are also seen in the subcarinal region, consistent with old granulomatous disease. No new or increased areas of lymphadenopathy identified.   Lungs/Pleura: Tiny left pleural effusion shows decreased size since previous study. Masslike lesion with central area of cavitation is again seen in the posterior left lower lobe, measuring 5.0 x 2.9 cm on image 62/2. This has decreased in size from 6.6 x 4.1 cm previously. Mild centrilobular emphysema again noted.   Upper Abdomen:  Unremarkable.   Musculoskeletal:  No suspicious bone lesions.   IMPRESSION: Mild decrease in size of left lower lobe  cavitary mass, most consistent with resolving infection with lung abscess. Recommend continued follow-up by CT in 3 months.   Decreased tiny left pleural effusion.   Stable mild bilateral hilar and mediastinal lymphadenopathy, likely reactive in etiology. Recommend continued attention on follow-up CT.   Aortic Atherosclerosis (ICD10-I70.0) and Emphysema (ICD10-J43.9).     Electronically Signed   By: Marlaine Hind M.D.   On: 06/27/2020 15:27 I personally reviewed the CT images.  There has been a decrease in size of the mass like lesion in the left lower lobe with central cavitation.  Small left effusion.  Impression: [name] is a 80 year old woman with history of hypertension, hyperlipidemia, PAD, iliac artery stent, ductal carcinoma in situ of the breast, and remote tobacco abuse.   Left main and three-vessel CAD-ruled in for non-STEMI, status post coronary bypass grafting almost 8 weeks ago.  Cavitary left lung lesion-lesion has gotten smaller but is still complex and cavitary.  Most likely infection although she never really had fever and chills or any major respiratory issues.  I think this point I will plan to rescan her again in about 8 weeks just to make sure that this continues to resolve.  Status post CABG-now almost 8 weeks out from surgery.  There are no restrictions on her activities, but she was advised to build into new activities gradually.  She was cautioned to avoid outdoor activities in the current heat wave.  Plan: Return in 8 weeks with CT chest  Melrose Nakayama, MD [facility] [phone]

## 2020-07-07 ENCOUNTER — Other Ambulatory Visit: Payer: Self-pay | Admitting: Physician Assistant

## 2020-07-14 ENCOUNTER — Other Ambulatory Visit: Payer: Self-pay | Admitting: *Deleted

## 2020-07-18 ENCOUNTER — Other Ambulatory Visit: Payer: Self-pay | Admitting: *Deleted

## 2020-07-18 DIAGNOSIS — R918 Other nonspecific abnormal finding of lung field: Secondary | ICD-10-CM

## 2020-07-24 ENCOUNTER — Other Ambulatory Visit: Payer: Self-pay

## 2020-07-24 ENCOUNTER — Inpatient Hospital Stay
Admission: EM | Admit: 2020-07-24 | Discharge: 2020-07-28 | DRG: 286 | Disposition: A | Discharge status: Home Health | Payer: Medicare Other | Attending: Internal Medicine | Admitting: Internal Medicine

## 2020-07-24 ENCOUNTER — Telehealth: Payer: Self-pay

## 2020-07-24 ENCOUNTER — Emergency Department: Payer: Medicare Other

## 2020-07-24 DIAGNOSIS — U071 COVID-19: Secondary | ICD-10-CM | POA: Diagnosis present

## 2020-07-24 DIAGNOSIS — Z951 Presence of aortocoronary bypass graft: Secondary | ICD-10-CM | POA: Diagnosis not present

## 2020-07-24 DIAGNOSIS — Z79899 Other long term (current) drug therapy: Secondary | ICD-10-CM | POA: Diagnosis not present

## 2020-07-24 DIAGNOSIS — I200 Unstable angina: Secondary | ICD-10-CM

## 2020-07-24 DIAGNOSIS — I5023 Acute on chronic systolic (congestive) heart failure: Secondary | ICD-10-CM | POA: Diagnosis present

## 2020-07-24 DIAGNOSIS — I2510 Atherosclerotic heart disease of native coronary artery without angina pectoris: Secondary | ICD-10-CM | POA: Diagnosis present

## 2020-07-24 DIAGNOSIS — Z923 Personal history of irradiation: Secondary | ICD-10-CM | POA: Diagnosis not present

## 2020-07-24 DIAGNOSIS — J984 Other disorders of lung: Secondary | ICD-10-CM | POA: Diagnosis not present

## 2020-07-24 DIAGNOSIS — I255 Ischemic cardiomyopathy: Secondary | ICD-10-CM | POA: Diagnosis present

## 2020-07-24 DIAGNOSIS — Z888 Allergy status to other drugs, medicaments and biological substances status: Secondary | ICD-10-CM

## 2020-07-24 DIAGNOSIS — Z881 Allergy status to other antibiotic agents status: Secondary | ICD-10-CM | POA: Diagnosis not present

## 2020-07-24 DIAGNOSIS — Z7982 Long term (current) use of aspirin: Secondary | ICD-10-CM

## 2020-07-24 DIAGNOSIS — M1990 Unspecified osteoarthritis, unspecified site: Secondary | ICD-10-CM | POA: Diagnosis present

## 2020-07-24 DIAGNOSIS — Z7902 Long term (current) use of antithrombotics/antiplatelets: Secondary | ICD-10-CM | POA: Diagnosis not present

## 2020-07-24 DIAGNOSIS — Z853 Personal history of malignant neoplasm of breast: Secondary | ICD-10-CM

## 2020-07-24 DIAGNOSIS — E876 Hypokalemia: Secondary | ICD-10-CM | POA: Diagnosis present

## 2020-07-24 DIAGNOSIS — Z28310 Unvaccinated for covid-19: Secondary | ICD-10-CM

## 2020-07-24 DIAGNOSIS — I252 Old myocardial infarction: Secondary | ICD-10-CM

## 2020-07-24 DIAGNOSIS — I110 Hypertensive heart disease with heart failure: Principal | ICD-10-CM | POA: Diagnosis present

## 2020-07-24 DIAGNOSIS — I739 Peripheral vascular disease, unspecified: Secondary | ICD-10-CM | POA: Diagnosis present

## 2020-07-24 DIAGNOSIS — J9601 Acute respiratory failure with hypoxia: Secondary | ICD-10-CM

## 2020-07-24 DIAGNOSIS — I509 Heart failure, unspecified: Secondary | ICD-10-CM

## 2020-07-24 DIAGNOSIS — E782 Mixed hyperlipidemia: Secondary | ICD-10-CM | POA: Diagnosis not present

## 2020-07-24 DIAGNOSIS — R911 Solitary pulmonary nodule: Secondary | ICD-10-CM | POA: Diagnosis present

## 2020-07-24 DIAGNOSIS — Z880 Allergy status to penicillin: Secondary | ICD-10-CM

## 2020-07-24 DIAGNOSIS — Z87891 Personal history of nicotine dependence: Secondary | ICD-10-CM

## 2020-07-24 DIAGNOSIS — I420 Dilated cardiomyopathy: Secondary | ICD-10-CM | POA: Diagnosis present

## 2020-07-24 DIAGNOSIS — I272 Pulmonary hypertension, unspecified: Secondary | ICD-10-CM | POA: Diagnosis present

## 2020-07-24 DIAGNOSIS — Z90710 Acquired absence of both cervix and uterus: Secondary | ICD-10-CM

## 2020-07-24 DIAGNOSIS — E785 Hyperlipidemia, unspecified: Secondary | ICD-10-CM | POA: Diagnosis present

## 2020-07-24 DIAGNOSIS — Z8249 Family history of ischemic heart disease and other diseases of the circulatory system: Secondary | ICD-10-CM

## 2020-07-24 DIAGNOSIS — I10 Essential (primary) hypertension: Secondary | ICD-10-CM | POA: Diagnosis present

## 2020-07-24 DIAGNOSIS — I25110 Atherosclerotic heart disease of native coronary artery with unstable angina pectoris: Secondary | ICD-10-CM | POA: Diagnosis not present

## 2020-07-24 DIAGNOSIS — Z8261 Family history of arthritis: Secondary | ICD-10-CM

## 2020-07-24 DIAGNOSIS — I25118 Atherosclerotic heart disease of native coronary artery with other forms of angina pectoris: Secondary | ICD-10-CM | POA: Diagnosis not present

## 2020-07-24 DIAGNOSIS — I5043 Acute on chronic combined systolic (congestive) and diastolic (congestive) heart failure: Secondary | ICD-10-CM | POA: Diagnosis not present

## 2020-07-24 LAB — BLOOD GAS, VENOUS
Acid-base deficit: 1.6 mmol/L (ref 0.0–2.0)
Bicarbonate: 24.8 mmol/L (ref 20.0–28.0)
O2 Saturation: 89.5 %
Patient temperature: 37
pCO2, Ven: 47 mmHg (ref 44.0–60.0)
pH, Ven: 7.33 (ref 7.250–7.430)
pO2, Ven: 62 mmHg — ABNORMAL HIGH (ref 32.0–45.0)

## 2020-07-24 LAB — CBC
HCT: 37.7 % (ref 36.0–46.0)
Hemoglobin: 12.6 g/dL (ref 12.0–15.0)
MCH: 31.3 pg (ref 26.0–34.0)
MCHC: 33.4 g/dL (ref 30.0–36.0)
MCV: 93.8 fL (ref 80.0–100.0)
Platelets: 191 10*3/uL (ref 150–400)
RBC: 4.02 MIL/uL (ref 3.87–5.11)
RDW: 15.9 % — ABNORMAL HIGH (ref 11.5–15.5)
WBC: 4.6 10*3/uL (ref 4.0–10.5)
nRBC: 0 % (ref 0.0–0.2)

## 2020-07-24 LAB — BASIC METABOLIC PANEL
Anion gap: 8 (ref 5–15)
BUN: 7 mg/dL — ABNORMAL LOW (ref 8–23)
CO2: 25 mmol/L (ref 22–32)
Calcium: 9.1 mg/dL (ref 8.9–10.3)
Chloride: 104 mmol/L (ref 98–111)
Creatinine, Ser: 0.64 mg/dL (ref 0.44–1.00)
GFR, Estimated: >60 mL/min (ref >60)
Glucose, Bld: 99 mg/dL (ref 70–99)
Potassium: 3.2 mmol/L — ABNORMAL LOW (ref 3.5–5.1)
Sodium: 137 mmol/L (ref 135–145)

## 2020-07-24 LAB — BRAIN NATRIURETIC PEPTIDE: B Natriuretic Peptide: 745.6 pg/mL — ABNORMAL HIGH (ref 0.0–100.0)

## 2020-07-24 LAB — TROPONIN I (HIGH SENSITIVITY): Troponin I (High Sensitivity): 22 ng/L — ABNORMAL HIGH (ref <18)

## 2020-07-24 MED ORDER — NITROGLYCERIN 0.4 MG SL SUBL: 0.4000 mg | SUBLINGUAL_TABLET | SUBLINGUAL | Status: DC | PRN

## 2020-07-24 MED ORDER — METOPROLOL TARTRATE 5 MG/5ML IV SOLN: 5.0000 mg | INTRAVENOUS | Status: DC | PRN

## 2020-07-24 MED ORDER — LORAZEPAM 0.5 MG PO TABS: 0.5000 mg | ORAL_TABLET | Freq: Once | ORAL | Status: DC

## 2020-07-24 MED ORDER — IOHEXOL 350 MG/ML SOLN
100.0000 mL | Freq: Once | INTRAVENOUS | Status: AC | PRN
  Administered 2020-07-24: 100 mL via INTRAVENOUS

## 2020-07-24 MED ORDER — DOCUSATE SODIUM 100 MG PO CAPS: 100.0000 mg | ORAL_CAPSULE | Freq: Two times a day (BID) | ORAL | Status: DC | PRN

## 2020-07-24 MED ORDER — ALBUTEROL SULFATE (2.5 MG/3ML) 0.083% IN NEBU: 2.5000 mg | INHALATION_SOLUTION | RESPIRATORY_TRACT | Status: DC

## 2020-07-24 MED ORDER — POLYETHYLENE GLYCOL 3350 17 G PO PACK: 17.0000 g | PACK | Freq: Every day | ORAL | Status: DC | PRN

## 2020-07-24 MED ORDER — NITROGLYCERIN 0.4 MG SL SUBL
SUBLINGUAL_TABLET | SUBLINGUAL | Status: AC
  Administered 2020-07-24: 0.4 mg via SUBLINGUAL
  Filled 2020-07-24: qty 3

## 2020-07-24 MED ORDER — ACETAMINOPHEN 325 MG PO TABS: 650.0000 mg | ORAL_TABLET | ORAL | Status: DC | PRN

## 2020-07-24 MED ORDER — METOPROLOL SUCCINATE ER 50 MG PO TB24
50.0000 mg | ORAL_TABLET | Freq: Two times a day (BID) | ORAL | Status: DC
  Administered 2020-07-25: 50 mg via ORAL
  Filled 2020-07-24: qty 1

## 2020-07-24 MED ORDER — CALCIUM CITRATE-VITAMIN D 500-500 MG-UNIT PO CHEW
CHEWABLE_TABLET | Freq: Every day | ORAL | Status: DC
  Administered 2020-07-25 – 2020-07-28 (×3): 1 via ORAL
  Filled 2020-07-24 (×5): qty 1

## 2020-07-24 MED ORDER — NITROGLYCERIN 2 % TD OINT: 2.0000 [in_us] | TOPICAL_OINTMENT | Freq: Once | TRANSDERMAL | Status: DC

## 2020-07-24 MED ORDER — ENOXAPARIN SODIUM 40 MG/0.4ML IJ SOSY: 40.0000 mg | PREFILLED_SYRINGE | INTRAMUSCULAR | Status: DC

## 2020-07-24 MED ORDER — ASPIRIN EC 81 MG PO TBEC
81.0000 mg | DELAYED_RELEASE_TABLET | Freq: Every day | ORAL | Status: DC
  Administered 2020-07-25 – 2020-07-28 (×4): 81 mg via ORAL
  Filled 2020-07-24 (×4): qty 1

## 2020-07-24 MED ORDER — CLOPIDOGREL BISULFATE 75 MG PO TABS
75.0000 mg | ORAL_TABLET | Freq: Every day | ORAL | Status: DC
  Administered 2020-07-25 – 2020-07-28 (×4): 75 mg via ORAL
  Filled 2020-07-24 (×4): qty 1

## 2020-07-24 MED ORDER — ATORVASTATIN CALCIUM 20 MG PO TABS: 20.0000 mg | ORAL_TABLET | Freq: Every day | ORAL | Status: DC

## 2020-07-24 MED ORDER — FUROSEMIDE 10 MG/ML IJ SOLN
40.0000 mg | Freq: Once | INTRAMUSCULAR | Status: AC
  Administered 2020-07-24: 40 mg via INTRAVENOUS
  Filled 2020-07-24: qty 4

## 2020-07-24 MED ORDER — LOSARTAN POTASSIUM 50 MG PO TABS
25.0000 mg | ORAL_TABLET | Freq: Every day | ORAL | Status: DC
  Administered 2020-07-25: 25 mg via ORAL
  Filled 2020-07-24: qty 1

## 2020-07-24 MED ORDER — PANTOPRAZOLE SODIUM 40 MG PO TBEC
40.0000 mg | DELAYED_RELEASE_TABLET | Freq: Every day | ORAL | Status: DC
  Administered 2020-07-25 – 2020-07-28 (×4): 40 mg via ORAL
  Filled 2020-07-24 (×4): qty 1

## 2020-07-24 NOTE — ED Triage Notes (Signed)
Pt comes with c/o SOB that started 5 days ago. Pt denies any CP. Pt states non productive cough.  Pt states more SOB with exertion.

## 2020-07-24 NOTE — Telephone Encounter (Signed)
Patient contacted the office and left voicemail message that she was having shortness of breath, increase in heart rate, and weakness. She is s/p CABG with Dr. Roxan Hockey 04/2020. Attempted to contact patient back, left voicemail message. See patient is currently in the ED for evaluation.

## 2020-07-24 NOTE — ED Notes (Signed)
Pt noted to be tachypneic, and hypoxic upon walking into room.  O2 sats 83% on RA.  Pt placed on 4L O2 and Dr. Charna Archer made aware of findings.  Pt now more comfortable.

## 2020-07-24 NOTE — H&P (Signed)
PULMONARY / CRITICAL CARE MEDICINE  Name: [name] MRN: [id number] DOB: [birth year]    LOS: 1  Admitting Provider: Arlyss Gandy. [place] Reason for Admission:  Acute Hypoxic respiratory failure secondary to COVID-19 infection Brief patient description: Patient presented with dyspnea; ED work-up revealed diffuse lung opacities, bilateral pleural effusions left greater than right suggestive of CHF.  Patient is admitted to the ICU for further management on BiPAP  HPI: 80 year old female with a history of coronary artery disease status postcoronary artery bypass graft x4 vessels in April 4196, systolic heart failure, hyperlipidemia, breast cancer s/p radiation and chemo, peripheral vascular disease and hypertension who presented to the ED via EMS with shortness of breath, weakness and tachycardia.  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  While in the ED, patient became more tachypneic, tachycardic and hypoxic on 4 L nasal cannula and she was transitioned to BiPAP.  She was also given Lasix.  PCCM was called to admit. Upon arrival in the ICU, patient's COVID 19 PCR test came back positive.  Patient is not vaccinated.  Admission labs show VBG with a PO2 of 62, pH of 7.33, and a PCO2 of 47.  She is mildly hypokalemic with a potassium of 3.2, proBNP of 745 and a troponin of 22.  Her CBC is unremarkable. CRP and procalcitonin are pending. Patient is awake, pleasant and offers no complaints besides dyspnea. Review of patient's chart from [place] shows that she was on HCTZ up till May 2022 but it was discontinued.  She is not currently on any diuretic.  She is on home O2 that she uses during ambulation.  Her last echocardiogram was in April 2022 and showed a left ventricular ejection fraction of 40 to 45%, left ventricular hypertrophy, and elevated left ventricular end-diastolic pressure  SIGNIFICANT  EVENTS: 07/24/20: ADMITTED, COVID-19 Positive; unvaccinated  Past Medical History:  Diagnosis Date   Arthritis    Breast cancer ([place]) 2011   radiation- Left   Hypertension    Personal history of radiation therapy    PVD (peripheral vascular disease) ([place])    Past Surgical History:  Procedure Laterality Date   ABDOMINAL HYSTERECTOMY     BREAST BIOPSY Left 12/21/2009   positive, radiation dcis   BREAST EXCISIONAL BIOPSY Left 01/17/2010   lumpectomy DCIS   BREAST LUMPECTOMY     CATARACT EXTRACTION W/ INTRAOCULAR LENS IMPLANT & ANTERIOR VITRECTOMY, BILATERAL     CHOLECYSTECTOMY     COLONOSCOPY WITH PROPOFOL N/A 09/25/2017   Procedure: COLONOSCOPY WITH PROPOFOL;  Surgeon: Lollie Sails, MD;  Location: [facility] ENDOSCOPY;  Service: Endoscopy;  Laterality: N/A;   CORONARY ARTERY BYPASS GRAFT N/A 05/03/2020   Procedure: CORONARY ARTERY BYPASS GRAFTING (CABG) X FOUR ON PUMP USING LEFT INTERNAL MAMMARY ARTERY AND RIGHT ENDOSCOPIC SAPHEOUS VEIN HARVEST CONDUITS;  Surgeon: Melrose Nakayama, MD;  Location: [facility];  Service: Open Heart Surgery;  Laterality: N/A;   DILATION AND CURETTAGE OF UTERUS     JOINT REPLACEMENT Right 01/24/2016   KNEE ARTHROPLASTY Right 01/24/2016   Procedure: COMPUTER ASSISTED TOTAL KNEE ARTHROPLASTY;  Surgeon: Dereck Leep, MD;  Location: [facility] ORS;  Service: Orthopedics;  Laterality: Right;   LEFT HEART CATH AND CORONARY ANGIOGRAPHY N/A 05/01/2020   Procedure: LEFT HEART CATH AND CORONARY ANGIOGRAPHY;  Surgeon: Wellington Hampshire, MD;  Location: [facility];  Service: Cardiovascular;  Laterality: N/A;   stent in Lt leg Left  TEE WITHOUT CARDIOVERSION N/A 05/03/2020   Procedure: TRANSESOPHAGEAL ECHOCARDIOGRAM (TEE);  Surgeon: Melrose Nakayama, MD;  Location: [facility];  Service: Open Heart Surgery;  Laterality: N/A;   No current facility-administered medications on file prior to encounter.   Current Outpatient Medications on File Prior to Encounter   Medication Sig   aspirin EC 81 MG tablet Take 81 mg by mouth daily.   atorvastatin (LIPITOR) 20 MG tablet Take 1 tablet (20 mg total) by mouth at bedtime.   Calcium Carbonate-Vit D-Min (CALTRATE 600+D PLUS PO) Take 1 tablet by mouth daily.    clopidogrel (PLAVIX) 75 MG tablet Take 1 tablet (75 mg total) by mouth daily.   losartan (COZAAR) 25 MG tablet Take 1 tablet (25 mg total) by mouth daily.   metoprolol succinate (TOPROL-XL) 50 MG 24 hr tablet Take 1 tablet (50 mg total) by mouth in the morning and at bedtime. Take with or immediately following a meal.   potassium chloride SA (KLOR-CON) 20 MEQ tablet Take 20 mEq by mouth daily.   acetaminophen (TYLENOL) 500 MG tablet Take 1,000 mg by mouth every 6 (six) hours as needed for mild pain.   furosemide (LASIX) 20 MG tablet Take 1 tablet (20 mg total) by mouth daily as needed. For weight gain greater than 3 lbs overnight or 5 lbs in one week.    Allergies Allergies  Allergen Reactions   Ibuprofen Hives   Penicillins Hives    Tolerated Ancef in the past   Aleve [Naproxen Sodium] Hives   Avapro  [Irbesartan] Rash    Other reaction(s): Weal   Azithromycin Hives   Nickel Itching    Redness, itching and fluid discharge with nickel earrings   Other Other (See Comments)    Nickel"ears ran fluid and itching" redness and "sensitive" Nickel"ears ran fluid and itching"    Family History Family History  Problem Relation Age of Onset   Hypertension Mother    Arthritis Mother    Gout Mother    Lung cancer Father    Heart attack Sister    Social History  reports that she has quit smoking. She has never used smokeless tobacco. She reports that she does not drink alcohol and does not use drugs.  Review Of Systems:  Constitutional: Positive for generalized weakness, but negative for fever and chills. Eyes: No visual changes. ENT: No sore throat. Cardiovascular: Denies chest pain but reports palpations and dyspnea Respiratory: Positive for  shortness of breath. Gastrointestinal: No abdominal pain.  No nausea, no vomiting.  No diarrhea.  No constipation. Genitourinary: Negative for dysuria. Musculoskeletal: Negative for back pain. Skin: Negative for rash. Neurological: Negative for headaches, focal weakness or numbness VITAL SIGNS: BP 113/70   Pulse 97   Temp 97.7 F (36.5 C) (Oral)   Resp (!) 26   Wt 52.4 kg   SpO2 96%   BMI 22.56 kg/m   HEMODYNAMICS:   VENTILATOR SETTINGS:   BIPAP FiO2 40%, RR 12  INTAKE / OUTPUT: No intake/output data recorded.  PHYSICAL EXAMINATION: General:  Chronically ill-looking, normal WOB HEENT:  PERRLA, mild JVD Neuro:  AAO X 4, moves all extremities Cardiovascular:  AP regular, S1/S2 Lungs: Diffuse rhonchi and crackles in all lung fields Abdomen:  Non-tender, non-distended and  Musculoskeletal:  +rom, no deformities Skin:  warm and dry  LABS:  BMET Recent Labs  Lab 07/24/20 1545  NA 137  K 3.2*  CL 104  CO2 25  BUN 7*  CREATININE 0.64  GLUCOSE 99  Electrolytes Recent Labs  Lab 07/24/20 1545  CALCIUM 9.1    CBC Recent Labs  Lab 07/24/20 1545 07/25/20 0411  WBC 4.6 4.8  HGB 12.6 13.2  HCT 37.7 39.3  PLT 191 191    Coag's No results for input(s): APTT, INR in the last 168 hours.  Sepsis Markers No results for input(s): LATICACIDVEN, PROCALCITON, O2SATVEN in the last 168 hours.  ABG No results for input(s): PHART, PCO2ART, PO2ART in the last 168 hours.  Liver Enzymes No results for input(s): AST, ALT, ALKPHOS, BILITOT, ALBUMIN in the last 168 hours.  Cardiac Enzymes No results for input(s): TROPONINI, PROBNP in the last 168 hours.  Glucose No results for input(s): GLUCAP in the last 168 hours.  Imaging DG Chest 2 View  Result Date: 07/24/2020 CLINICAL DATA:  Shortness of breath EXAM: CHEST - 2 VIEW COMPARISON:  Chest CT June 27, 2020 FINDINGS: Normal size heart. Nodular mediastinal contour likely reflecting adenopathy seen on prior CT.  Prior median sternotomy and CABG. Similar size of the cavitary mass like lesion in the posterior left lower lobe. Increased size of the a small left pleural effusion with a new small right pleural effusion. Mild diffuse interstitial opacities. IMPRESSION: 1. Increased size of the small left pleural effusion with a new small right pleural effusion, with possible mild interstitial edema. 2. Similar size of the mass like lesion in the posterior left lower lobe. Electronically Signed   By: Dahlia Bailiff MD   On: 07/24/2020 16:54   CT Angio Chest PE W/Cm &/Or Wo Cm  Addendum Date: 07/24/2020   ADDENDUM REPORT: 07/24/2020 22:21 ADDENDUM: These results were called by telephone at the time of interpretation on 07/24/2020 at 10:21 pm to provider [facility] , who verbally acknowledged these results. Electronically Signed   By: Lovena Le M.D.   On: 07/24/2020 22:21   Result Date: 07/24/2020 CLINICAL DATA:  Shortness of breath which began 5 days ago, nonproductive cough EXAM: CT ANGIOGRAPHY CHEST WITH CONTRAST TECHNIQUE: Multidetector CT imaging of the chest was performed using the standard protocol during bolus administration of intravenous contrast. Multiplanar CT image reconstructions and MIPs were obtained to evaluate the vascular anatomy. CONTRAST:  155mL OMNIPAQUE IOHEXOL 350 MG/ML SOLN COMPARISON:  CT 06/27/2020, mammogram 02/02/2018, 03/25/2019 FINDINGS: Cardiovascular: Satisfactory opacification the pulmonary arteries to the segmental level. No pulmonary artery filling defects are identified. Central pulmonary arteries are normal caliber. Evidence of prior sternotomy and CABG. Study is not tailored for evaluation of the vascular is a shin grafts nor the thoracic aorta. Dense calcification of the native coronary arteries. Cardiac size within normal limits. Atherosclerotic plaque within the normal caliber aorta. The left vertebral artery arises directly from the calcified aortic arch. Calcifications present  in the proximal great vessels as well. Some minimal reflux of contrast into the IVC. No other major venous abnormality. Mediastinum/Nodes: Calcified mediastinal nodes are present in the subcarinal region. Borderline enlarged mediastinal and hilar nodes are nonspecific though not significantly changed from comparison prior. No other pathologically enlarged mediastinal, hilar or axillary adenopathy. No acute abnormality of the trachea or thoracic esophagus. Stable appearance of the thyroid gland without concerning dominant nodule. Lungs/Pleura: Pulmonary vascular redistribution with diffuse septal and fissural thickening. Peribronchovascular cuffing noted as well. Some developing hazy ground-glass opacities may reflect combination of edema and atelectasis. Additional more patchy opacity is seen in the periphery of the left upper lobe (4/25) increasing size of bilateral pleural effusions with adjacent passive atelectasis. Redemonstration of a cavitary lesion seen  in the posterior left lower lobe measuring 6 x 3.1 cm, previously 5.2 x 3.1 cm when measured at a similar level. This appears thick-walled with some increasing central hypoattenuating material. Upper Abdomen: No acute abnormalities present in the visualized portions of the upper abdomen. Musculoskeletal: Exaggerated thoracic kyphosis with multilevel discogenic and facet degenerative changes. Prior sternotomy and CABG. Degenerative features in the bilateral shoulders. The osseous structures appear diffusely demineralized which may limit detection of small or nondisplaced fractures. No acute osseous abnormality or suspicious osseous lesion. Stable size and appearance of a 2.3 x 1.7 cm thick-walled cystic appearing mass in the left breast (4/41). Review of the MIP images confirms the above findings. IMPRESSION: 1. No evidence of pulmonary artery embolism. 2. Features suggesting CHF/volume overload with increasing bilateral effusions, pulmonary vascular  congestion, venous reflux to the IVC, and developing pulmonary edema. 3. Slight interval increase in size of a persisting cavitary lesion in the left lung base. While this could reflect a persisting lung abscess, underlying mass lesion is not fully excluded. 4. Additional ill-defined ground-glass opacity is noted in the periphery of the left upper lobe (4/26), could reflect some acute infectious or inflammatory process versus satellite lesion. 5.  Aortic Atherosclerosis (ICD10-I70.0). 6. Native coronary artery atherosclerosis with prior sternotomy, CABG. 7. Stable appearance of a 2.3 cm thick-walled cystic appearing mass in the left breast. May reflect a region of fat necrosis or postsurgical seroma as seen comparison mammography. Electronically Signed: By: Lovena Le M.D. On: 07/24/2020 22:15    STUDIES:  Echo pending  CULTURES: None  ANTIBIOTICS: NONE   LINES/TUBES: PIVs  ASSESSMENT / PLAN:  PULMONARY A: Acute hypoxic respiratory failure COVID-19 infection P:   BiPAP and adjust settings to maintains SPO2>90% IV steroids Paxlovid per protocol ABG prn PRN Albuterol  CARDIOVASCULAR A:  Acute Systolic CHF exacerbation CAD s/p CABG X4 vessels Hypertension P:  Iv lasix 2-D echo Resume all home medications Consider cardiology input  RENAL A:   Hypokalemia P:   IV potassium   Best Practice: Code Status:Full code Diet: NPO while on BiPAP and heart healthy diet once off BiPAP GI prophylaxis: PPI VTE prophylaxis:  Lovenox  FAMILY  - Updates: no family at bedside. Day shift will update family after rounds  Kei Langhorst S. [facility] ANP-BC Pulmonary and Critical Care Medicine [facility] Pager [phone] or [phone]  NB: This document was prepared using Dragon voice recognition software and may include unintentional dictation errors.    07/25/2020, 5:10 AM

## 2020-07-24 NOTE — ED Provider Notes (Signed)
[facility] Emergency Department Provider Note   ____________________________________________   Event Date/Time   First MD Initiated Contact with Patient 07/24/20 1933     (approximate)  I have reviewed the triage vital signs and the nursing notes.   HISTORY  Chief Complaint Shortness of Breath    HPI [name] is a 80 y.o. female with past medical history of hypertension, hyperlipidemia, peripheral arterial disease, and CAD status post CABG who presents to the ED complaining of shortness of breath.  Patient reports that she has been feeling increasingly weak along with difficulty catching her breath for the past 5 days.  She denies any associated fevers, cough, or chest pain, but has noticed some mild swelling in her right leg.  She had coronary artery bypass grafting performed in April at [facility], but states she has been doing well since then.  She was found to have a left lower lobe mass at that time, thought to be an abscess and being followed with regular CT scans.  She has not been on antibiotics recently for this, had follow-up CT scan last month showing gradual improvement.        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 05/30/2020   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   NSTEMI (non-ST elevated myocardial infarction) ([place]) 04/30/2020   Elevated troponin 04/30/2020   History of left breast cancer 11/25/2019   Hyperlipidemia 11/25/2019   Carotid stenosis 08/24/2017   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   Pain  01/22/2016   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/2016   PAD (peripheral artery disease) ([place]) 11/22/2014   H/O malignant neoplasm of breast 11/22/2014   HLD (hyperlipidemia) 11/22/2014   Essential hypertension 11/22/2014   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    Past Surgical History:  Procedure Laterality Date   ABDOMINAL HYSTERECTOMY     BREAST BIOPSY Left 12/21/2009   positive, radiation dcis   BREAST EXCISIONAL BIOPSY Left 01/17/2010   lumpectomy DCIS   BREAST LUMPECTOMY     CATARACT EXTRACTION W/ INTRAOCULAR LENS IMPLANT & ANTERIOR VITRECTOMY, BILATERAL     CHOLECYSTECTOMY     COLONOSCOPY WITH PROPOFOL N/A 09/25/2017   Procedure: COLONOSCOPY WITH PROPOFOL;  Surgeon: Lollie Sails, MD;  Location: [facility] ENDOSCOPY;  Service: Endoscopy;  Laterality: N/A;   CORONARY ARTERY BYPASS GRAFT N/A 05/03/2020   Procedure: CORONARY ARTERY BYPASS GRAFTING (CABG) X FOUR ON PUMP USING LEFT INTERNAL MAMMARY ARTERY AND RIGHT ENDOSCOPIC SAPHEOUS VEIN HARVEST CONDUITS;  Surgeon: Melrose Nakayama, MD;  Location: [facility];  Service: Open Heart Surgery;  Laterality: N/A;   DILATION AND CURETTAGE OF UTERUS     JOINT REPLACEMENT Right 01/24/2016   KNEE ARTHROPLASTY Right 01/24/2016   Procedure: COMPUTER ASSISTED TOTAL KNEE ARTHROPLASTY;  Surgeon: Dereck Leep, MD;  Location: [facility] ORS;  Service: Orthopedics;  Laterality: Right;   LEFT HEART CATH AND CORONARY ANGIOGRAPHY N/A 05/01/2020   Procedure: LEFT HEART CATH AND CORONARY ANGIOGRAPHY;  Surgeon: Wellington Hampshire, MD;  Location: [place] CV LAB;  Service: Cardiovascular;  Laterality: N/A;   stent in Lt leg Left    TEE WITHOUT CARDIOVERSION N/A 05/03/2020   Procedure: TRANSESOPHAGEAL ECHOCARDIOGRAM (TEE);  Surgeon: Melrose Nakayama, MD;  Location: [place];  Service: Open Heart Surgery;  Laterality: N/A;    Prior to Admission medications   Medication Sig Start Date End Date Taking? Authorizing Provider  aspirin EC 81 MG  tablet Take 81 mg by mouth daily.   Yes [provider]  atorvastatin (LIPITOR) 20 MG tablet Take 1 tablet (20 mg total) by mouth at bedtime. 06/22/20  Yes Visser, Jacquelyn D, PA-C  Calcium Carbonate-Vit D-Min (CALTRATE 600+D PLUS PO) Take 1 tablet by mouth daily.    Yes [provider]  clopidogrel (PLAVIX) 75 MG tablet Take 1 tablet (75 mg total) by mouth daily. 05/22/20 05/17/21 Yes Visser, Jacquelyn D, PA-C  losartan (COZAAR) 25 MG tablet Take 1 tablet (25 mg total) by mouth daily. 05/22/20 05/17/21 Yes Visser, Jacquelyn D, PA-C  metoprolol succinate (TOPROL-XL) 50 MG 24 hr tablet Take 1 tablet (50 mg total) by mouth in the morning and at bedtime. Take with or immediately following a meal. 06/22/20 10/20/20 Yes Visser, Jacquelyn D, PA-C  potassium chloride SA (KLOR-CON) 20 MEQ tablet Take 20 mEq by mouth daily. 05/08/20  Yes [provider]  acetaminophen (TYLENOL) 500 MG tablet Take 1,000 mg by mouth every 6 (six) hours as needed for mild pain.    [provider]  furosemide (LASIX) 20 MG tablet Take 1 tablet (20 mg total) by mouth daily as needed. For weight gain greater than 3 lbs overnight or 5 lbs in one week. 06/22/20   Marrianne Mood D, PA-C    Allergies Ibuprofen, Penicillins, Aleve [naproxen sodium], Avapro  [irbesartan], Azithromycin, Nickel, and Other  Family History  Problem Relation Age of Onset   Hypertension Mother    Arthritis Mother    Gout Mother    Lung cancer Father    Heart attack Sister     Social History Social History   Tobacco Use   Smoking status: Former    Pack years: 0.00   Smokeless tobacco: Never  Vaping Use   Vaping Use: Never used  Substance Use Topics   Alcohol use: No    Alcohol/week: 0.0 standard drinks   Drug use: No    Review of Systems  Constitutional: No fever/chills.  Positive for generalized weakness. Eyes: No visual changes. ENT: No sore throat. Cardiovascular: Denies chest pain. Respiratory: Positive  for shortness of breath. Gastrointestinal: No abdominal pain.  No nausea, no vomiting.  No diarrhea.  No constipation. Genitourinary: Negative for dysuria. Musculoskeletal: Negative for back pain. Skin: Negative for rash. Neurological: Negative for headaches, focal weakness or numbness.  ____________________________________________   PHYSICAL EXAM:  VITAL SIGNS: ED Triage Vitals  Enc Vitals Group     BP 07/24/20 1554 (!) 174/92     Pulse Rate 07/24/20 1554 (!) 114     Resp 07/24/20 1554 (!) 21     Temp 07/24/20 1554 97.7 F (36.5 C)     Temp Source 07/24/20 1554 Oral     SpO2 07/24/20 1554 95 %     Weight --      Height --      Head Circumference --      Peak Flow --      Pain Score 07/24/20 1558 3     Pain Loc --      Pain  Edu? --      Excl. in [place]? --     Constitutional: Alert and oriented. Eyes: Conjunctivae are normal. Head: Atraumatic. Nose: No congestion/rhinnorhea. Mouth/Throat: Mucous membranes are moist. Neck: Normal ROM Cardiovascular: Tachycardic, regular rhythm. Grossly normal heart sounds.  2+ radial pulses bilaterally. Respiratory: Normal respiratory effort.  No retractions. Lungs with crackles to bilateral bases. Gastrointestinal: Soft and nontender. No distention. Genitourinary: deferred Musculoskeletal: Trace edema to right calf with no associated tenderness, no left lower extremity edema or tenderness to palpation. Neurologic:  Normal speech and language. No gross focal neurologic deficits are appreciated. Skin:  Skin is warm, dry and intact. No rash noted. Psychiatric: Mood and affect are normal. Speech and behavior are normal.  ____________________________________________   LABS (all labs ordered are listed, but only abnormal results are displayed)  Labs Reviewed  CBC - Abnormal; Notable for the following components:      Result Value   RDW 15.9 (*)    All other components within normal limits  BASIC METABOLIC PANEL - Abnormal; Notable for  the following components:   Potassium 3.2 (*)    BUN 7 (*)    All other components within normal limits  BRAIN NATRIURETIC PEPTIDE - Abnormal; Notable for the following components:   B Natriuretic Peptide 745.6 (*)    All other components within normal limits  BLOOD GAS, VENOUS - Abnormal; Notable for the following components:   pO2, Ven 62.0 (*)    All other components within normal limits  TROPONIN I (HIGH SENSITIVITY) - Abnormal; Notable for the following components:   Troponin I (High Sensitivity) 22 (*)    All other components within normal limits  RESP PANEL BY RT-PCR (FLU A&B, COVID) ARPGX2   ____________________________________________  EKG  ED ECG REPORT I, Blake Divine, the attending physician, personally viewed and interpreted this ECG.   Date: 07/24/2020  EKG Time: 16:00  Rate: 105  Rhythm: sinus tachycardia  Axis: Normal  Intervals:left bundle branch block  ST&T Change: None  ED ECG REPORT I, Blake Divine, the attending physician, personally viewed and interpreted this ECG.   Date: 07/24/2020  EKG Time: 21:50  Rate: 131  Rhythm: sinus tachycardia  Axis: RAD  Intervals:left bundle branch block  ST&T Change: None    PROCEDURES  Procedure(s) performed (including Critical Care):  .Critical Care  Date/Time: 07/24/2020 11:02 PM Performed by: Blake Divine, MD Authorized by: Blake Divine, MD   Critical care provider statement:    Critical care time (minutes):  45   Critical care time was exclusive of:  Separately billable procedures and treating other patients and teaching time   Critical care was necessary to treat or prevent imminent or life-threatening deterioration of the following conditions:  Respiratory failure   Critical care was time spent personally by me on the following activities:  Discussions with consultants, evaluation of patient's response to treatment, examination of patient, ordering and performing treatments and interventions,  ordering and review of laboratory studies, ordering and review of radiographic studies, pulse oximetry, re-evaluation of patient's condition, obtaining history from patient or surrogate and review of old charts   I assumed direction of critical care for this patient from another provider in my specialty: no     Care discussed with: admitting provider     ____________________________________________   INITIAL IMPRESSION / ASSESSMENT AND PLAN / ED COURSE      80 year old female with past medical history of hypertension, hyperlipidemia, PAD, and CAD status post CABG who presents to the  ED complaining of increasing generalized weakness along with shortness of breath over the past 5 days.  On initial evaluation, patient is not in any respiratory distress and is maintaining O2 sats on room air, does have crackles to bilateral bases with right lower extremity edema.  Chest x-ray reviewed by me and shows bilateral effusions with persistent left lower lobe mass concerning for abscess versus malignancy.  Abscess seems less likely given no fever or cough, no leukocytosis noted.  We will further assess with CTA to rule out PE and further assess lung mass.  CTA is negative for PE but does have significant findings consistent with CHF.  Lung mass is unchanged and could represent abscess versus malignancy, but infectious process seems less likely.  Upon returning from CT scan, I was notified by nursing that patient had developed respiratory distress, found to have O2 sats in the low 80s.  She was placed on 4 L nasal cannula with improvement and heart rate as well as respiratory rate seem to be gradually improving, IV lasix was ordered and case discussed with hospitalist for admission.    Shortly afterwards, patient once again developed worsening shortness of breath with increasing tachypnea and tachycardia.  EKG shows sinus tachycardia with no ischemic changes.  Decision was made to place patient on BiPAP and she  was also given sublingual nitroglycerin for apparent CHF.  Dr. Joni Fears was also at the bedside and discussed case with ICU provider for admission.      ____________________________________________   FINAL CLINICAL IMPRESSION(S) / ED DIAGNOSES  Final diagnoses:  Acute respiratory failure with hypoxia (HCC)  Acute on chronic congestive heart failure, unspecified heart failure type [facility])     ED Discharge Orders     None        Note:  This document was prepared using Dragon voice recognition software and may include unintentional dictation errors.    Blake Divine, MD 07/24/20 2308

## 2020-07-25 ENCOUNTER — Telehealth: Payer: Self-pay | Admitting: Physician Assistant

## 2020-07-25 ENCOUNTER — Inpatient Hospital Stay
Admit: 2020-07-25 | Discharge: 2020-07-25 | Disposition: A | Discharge status: Home / Self Care | Payer: Medicare Other | Attending: Adult Health | Admitting: Adult Health

## 2020-07-25 DIAGNOSIS — J9601 Acute respiratory failure with hypoxia: Secondary | ICD-10-CM | POA: Diagnosis not present

## 2020-07-25 DIAGNOSIS — U071 COVID-19: Secondary | ICD-10-CM

## 2020-07-25 DIAGNOSIS — J984 Other disorders of lung: Secondary | ICD-10-CM | POA: Diagnosis not present

## 2020-07-25 DIAGNOSIS — I5043 Acute on chronic combined systolic (congestive) and diastolic (congestive) heart failure: Secondary | ICD-10-CM

## 2020-07-25 DIAGNOSIS — I25118 Atherosclerotic heart disease of native coronary artery with other forms of angina pectoris: Secondary | ICD-10-CM | POA: Diagnosis not present

## 2020-07-25 DIAGNOSIS — I10 Essential (primary) hypertension: Secondary | ICD-10-CM

## 2020-07-25 LAB — CBC
HCT: 39.3 % (ref 36.0–46.0)
Hemoglobin: 13.2 g/dL (ref 12.0–15.0)
MCH: 31.7 pg (ref 26.0–34.0)
MCHC: 33.6 g/dL (ref 30.0–36.0)
MCV: 94.2 fL (ref 80.0–100.0)
Platelets: 191 10*3/uL (ref 150–400)
RBC: 4.17 MIL/uL (ref 3.87–5.11)
RDW: 16.2 % — ABNORMAL HIGH (ref 11.5–15.5)
WBC: 4.8 10*3/uL (ref 4.0–10.5)
nRBC: 0 % (ref 0.0–0.2)

## 2020-07-25 LAB — ECHOCARDIOGRAM COMPLETE
AR max vel: 2.04 cm2
AV Area VTI: 2.98 cm2
AV Area mean vel: 2.28 cm2
AV Mean grad: 1 mmHg
AV Peak grad: 2.1 mmHg
Ao pk vel: 0.73 m/s
Area-P 1/2: 6.27 cm2
Calc EF: 28.6 %
S' Lateral: 4.24 cm
Single Plane A2C EF: 13.5 %
Single Plane A4C EF: 34.1 %
Weight: 1848.34 oz

## 2020-07-25 LAB — C-REACTIVE PROTEIN: CRP: 0.7 mg/dL (ref <1.0)

## 2020-07-25 LAB — GLUCOSE, CAPILLARY: Glucose-Capillary: 119 mg/dL — ABNORMAL HIGH (ref 70–99)

## 2020-07-25 LAB — POTASSIUM: Potassium: 3.7 mmol/L (ref 3.5–5.1)

## 2020-07-25 LAB — MAGNESIUM: Magnesium: 1.8 mg/dL (ref 1.7–2.4)

## 2020-07-25 LAB — BASIC METABOLIC PANEL
Anion gap: 8 (ref 5–15)
BUN: 7 mg/dL — ABNORMAL LOW (ref 8–23)
CO2: 27 mmol/L (ref 22–32)
Calcium: 8.9 mg/dL (ref 8.9–10.3)
Chloride: 105 mmol/L (ref 98–111)
Creatinine, Ser: 0.49 mg/dL (ref 0.44–1.00)
GFR, Estimated: >60 mL/min (ref >60)
Glucose, Bld: 96 mg/dL (ref 70–99)
Potassium: 2.9 mmol/L — ABNORMAL LOW (ref 3.5–5.1)
Sodium: 140 mmol/L (ref 135–145)

## 2020-07-25 LAB — PHOSPHORUS: Phosphorus: 4.7 mg/dL — ABNORMAL HIGH (ref 2.5–4.6)

## 2020-07-25 LAB — RESP PANEL BY RT-PCR (FLU A&B, COVID) ARPGX2
Influenza A by PCR: NEGATIVE
Influenza B by PCR: NEGATIVE
SARS Coronavirus 2 by RT PCR: POSITIVE — AB

## 2020-07-25 LAB — PROCALCITONIN: Procalcitonin: <0.1 ng/mL

## 2020-07-25 LAB — MRSA NEXT GEN BY PCR, NASAL: MRSA by PCR Next Gen: NOT DETECTED

## 2020-07-25 MED ORDER — FUROSEMIDE 10 MG/ML IJ SOLN: 40.0000 mg | Freq: Every day | INTRAMUSCULAR | Status: DC

## 2020-07-25 MED ORDER — METOPROLOL SUCCINATE ER 25 MG PO TB24
12.5000 mg | ORAL_TABLET | Freq: Two times a day (BID) | ORAL | Status: DC
  Administered 2020-07-26: 12.5 mg via ORAL
  Filled 2020-07-25 (×3): qty 0.5

## 2020-07-25 MED ORDER — SODIUM CHLORIDE 0.9 % IV SOLN
200.0000 mg | Freq: Once | INTRAVENOUS | Status: AC
  Administered 2020-07-25: 200 mg via INTRAVENOUS
  Filled 2020-07-25: qty 40

## 2020-07-25 MED ORDER — NIRMATRELVIR/RITONAVIR (PAXLOVID) TABLET (RENAL DOSING)
2.0000 | ORAL_TABLET | Freq: Two times a day (BID) | ORAL | Status: DC
  Filled 2020-07-25: qty 20

## 2020-07-25 MED ORDER — FUROSEMIDE 10 MG/ML IJ SOLN
40.0000 mg | Freq: Every day | INTRAMUSCULAR | Status: DC
  Administered 2020-07-25: 40 mg via INTRAVENOUS
  Filled 2020-07-25: qty 4

## 2020-07-25 MED ORDER — ATORVASTATIN CALCIUM 20 MG PO TABS: 20.0000 mg | ORAL_TABLET | Freq: Every day | ORAL | Status: DC

## 2020-07-25 MED ORDER — ENOXAPARIN SODIUM 40 MG/0.4ML IJ SOSY
40.0000 mg | PREFILLED_SYRINGE | INTRAMUSCULAR | Status: DC
  Administered 2020-07-25 – 2020-07-28 (×3): 40 mg via SUBCUTANEOUS
  Filled 2020-07-25 (×3): qty 0.4

## 2020-07-25 MED ORDER — POTASSIUM CHLORIDE CRYS ER 20 MEQ PO TBCR
20.0000 meq | EXTENDED_RELEASE_TABLET | Freq: Once | ORAL | Status: AC
  Administered 2020-07-25: 20 meq via ORAL
  Filled 2020-07-25: qty 1

## 2020-07-25 MED ORDER — SODIUM CHLORIDE 0.9 % IV SOLN
100.0000 mg | Freq: Every day | INTRAVENOUS | Status: DC
  Filled 2020-07-25: qty 20

## 2020-07-25 MED ORDER — ALBUTEROL SULFATE HFA 108 (90 BASE) MCG/ACT IN AERS
2.0000 | INHALATION_SPRAY | RESPIRATORY_TRACT | Status: DC
  Administered 2020-07-25 – 2020-07-28 (×16): 2 via RESPIRATORY_TRACT
  Filled 2020-07-25: qty 6.7

## 2020-07-25 MED ORDER — MAGNESIUM SULFATE 2 GM/50ML IV SOLN
2.0000 g | Freq: Once | INTRAVENOUS | Status: AC
  Administered 2020-07-25: 2 g via INTRAVENOUS
  Filled 2020-07-25: qty 50

## 2020-07-25 MED ORDER — FUROSEMIDE 10 MG/ML IJ SOLN
20.0000 mg | Freq: Every day | INTRAMUSCULAR | Status: DC
  Filled 2020-07-25: qty 2

## 2020-07-25 MED ORDER — NIRMATRELVIR/RITONAVIR (PAXLOVID)TABLET: 3.0000 | ORAL_TABLET | Freq: Two times a day (BID) | ORAL | Status: DC

## 2020-07-25 MED ORDER — CHLORHEXIDINE GLUCONATE CLOTH 2 % EX PADS
6.0000 | MEDICATED_PAD | Freq: Every day | CUTANEOUS | Status: DC
  Administered 2020-07-26 – 2020-07-27 (×2): 6 via TOPICAL

## 2020-07-25 MED ORDER — FUROSEMIDE 10 MG/ML IJ SOLN
40.0000 mg | Freq: Two times a day (BID) | INTRAMUSCULAR | Status: DC
  Administered 2020-07-25 – 2020-07-26 (×2): 40 mg via INTRAVENOUS
  Filled 2020-07-25 (×2): qty 4

## 2020-07-25 MED ORDER — DEXAMETHASONE SODIUM PHOSPHATE 10 MG/ML IJ SOLN
6.0000 mg | INTRAMUSCULAR | Status: DC
  Administered 2020-07-25: 6 mg via INTRAVENOUS
  Filled 2020-07-25 (×2): qty 0.6

## 2020-07-25 MED ORDER — POTASSIUM CHLORIDE 10 MEQ/100ML IV SOLN
10.0000 meq | INTRAVENOUS | Status: AC
  Administered 2020-07-25 (×5): 10 meq via INTRAVENOUS
  Filled 2020-07-25 (×5): qty 100

## 2020-07-25 NOTE — Evaluation (Signed)
Occupational Therapy Evaluation Patient Details Name: [name] MRN: [id number] DOB: May 14, 1940 Today's Date: 07/25/2020    History of Present Illness 79 year old female with a history of coronary artery disease status postcoronary artery bypass graft x4 vessels in April 1937, systolic heart failure, hyperlipidemia, breast cancer s/p radiation and chemo, peripheral vascular disease and hypertension who presented to the ED via EMS with shortness of breath, weakness and tachycardia.  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  While in the ED, patient became more tachypneic, tachycardic and hypoxic on 4 L nasal cannula and she was transitioned to BiPAP.  She was also given Lasix.  PCCM was called to admit.  Upon arrival in the ICU, patient's COVID 19 PCR test came back positive.  Patient is not vaccinated.  Admission labs show VBG with a PO2 of 62, pH of 7.33, and a PCO2 of 47.  She is mildly hypokalemic with a potassium of 3.2, proBNP of 745 and a troponin of 22.  Her CBC is unremarkable.   Clinical Impression   Patient presenting with decreased I in self care, balance, functional mobility/transfer, endurance, and safety awareness.  Patient lives at home alone and independent in all aspects of care and mobility. Pt drives and performs all IADL tasks at baseline. Pt weaning this afternoon from BI-PAP and on 8L O2 via HFNC. Pt able to don B socks in long sitting and then transition to EOB with close supervision. Pt stands and marches in places and takes several steps over to recliner chair with min HHA. Pt's O2 saturation remaining at or above 90% this session. Pt's daughter arrives at end of session and reports pt will have 24/7 assist at home when discharged. Patient will benefit from acute OT to increase overall independence in the areas of ADLs, functional mobility, and safety awareness in order to safely  discharge home with family.    Follow Up Recommendations  Home health OT;Supervision/Assistance - 24 hour    Equipment Recommendations  None recommended by OT    Recommendations for Other Services       Precautions / Restrictions Precautions Precautions: Fall Restrictions Weight Bearing Restrictions: No      Mobility Bed Mobility Overal bed mobility: Needs Assistance Bed Mobility: Supine to Sit     Supine to sit: Supervision     General bed mobility comments: min cuing for technique but no physical assistance    Transfers Overall transfer level: Needs assistance Equipment used: 1 person hand held assist Transfers: Sit to/from Stand;Stand Pivot Transfers Sit to Stand: Min guard Stand pivot transfers: Min assist            Balance Overall balance assessment: Needs assistance Sitting-balance support: Feet supported;Bilateral upper extremity supported Sitting balance-Leahy Scale: Good     Standing balance support: During functional activity Standing balance-Leahy Scale: Fair                             ADL either performed or assessed with clinical judgement   ADL Overall ADL's : Needs assistance/impaired     Grooming: Wash/dry hands;Wash/dry face;Sitting;Set up               Lower Body Dressing: Supervision/safety;Set up;Bed level Lower Body Dressing Details (indicate cue type and reason): long sitting bed level Toilet Transfer: Minimal assistance;Ambulation Toilet Transfer Details (indicate cue type and reason): min HH simulated transfer  Functional mobility during ADLs: Min guard;Minimal assistance       Vision Baseline Vision/History: Wears glasses Wears Glasses: At all times Patient Visual Report: No change from baseline              Pertinent Vitals/Pain Pain Assessment: No/denies pain     Hand Dominance Right   Extremity/Trunk Assessment Upper Extremity Assessment Upper Extremity Assessment: Generalized  weakness   Lower Extremity Assessment Lower Extremity Assessment: Generalized weakness       Communication Communication Communication: No difficulties   Cognition Arousal/Alertness: Awake/alert Behavior During Therapy: WFL for tasks assessed/performed Overall Cognitive Status: Within Functional Limits for tasks assessed                                 General Comments: Pt is very pleasant and agreeable              Home Living Family/patient expects to be discharged to:: Private residence Living Arrangements: Alone Available Help at Discharge: Family;Available 24 hours/day Type of Home: House Home Access: Stairs to enter [employer] of Steps: 5 STE Entrance Stairs-Rails: Left Home Layout: Able to live on main level with bedroom/bathroom;Two level     Bathroom Shower/Tub: Walk-in shower;Door   Bathroom Toilet: Handicapped height     Home Equipment: Environmental consultant - 2 wheels;[place] - 4 wheels;Litle - standard;Bedside commode          Prior Functioning/Environment Level of Independence: Independent        Comments: Pt reports being I with ADLs and IADLs. 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 Very active and still drives.        OT Problem List: Decreased strength;Cardiopulmonary status limiting activity;Decreased activity tolerance;Decreased safety awareness;Impaired balance (sitting and/or standing);Decreased knowledge of use of DME or AE;Decreased knowledge of precautions      OT Treatment/Interventions: Self-care/ADL training;Modalities;Balance training;Therapeutic exercise;Therapeutic activities;Energy conservation;DME and/or AE instruction;Manual therapy;Patient/family education    OT Goals(Current goals can be found in the care plan section) Acute Rehab OT Goals Patient Stated Goal: to go home OT Goal Formulation: With patient/family Time For Goal Achievement: 08/08/20 Potential to Achieve Goals: Good ADL Goals Pt Will  Perform Grooming: with modified independence;standing Pt Will Transfer to Toilet: with modified independence;ambulating Pt Will Perform Toileting - Clothing Manipulation and hygiene: with modified independence;sit to/from stand Pt/caregiver will Perform Home Exercise Program: Increased strength;Both right and left upper extremity;With theraband;With written HEP provided;Independently  OT Frequency: Min 2X/week   Barriers to D/C:    none known at this time          AM-PAC OT "6 Clicks" Daily Activity     Outcome Measure Help from another person eating meals?: None Help from another person taking care of personal grooming?: A Little Help from another person toileting, which includes using toliet, bedpan, or urinal?: A Little Help from another person bathing (including washing, rinsing, drying)?: A Little Help from another person to put on and taking off regular upper body clothing?: None Help from another person to put on and taking off regular lower body clothing?: A Little 6 Click Score: 20   End of Session Nurse Communication: Mobility status  Activity Tolerance: Patient tolerated treatment well Patient left: in chair;with chair alarm set;with family/visitor present  OT Visit Diagnosis: Unsteadiness on feet (R26.81);Muscle weakness (generalized) (M62.81)                Time: 4665-9935 OT Time Calculation (  min): 34 min Charges:  OT General Charges $OT Visit: 1 Visit OT Evaluation $OT Eval Moderate Complexity: 1 Mod OT Treatments $Self Care/Home Management : 23-37 mins  Darleen Crocker, MS, OTR/L , CBIS ascom [phone]  07/25/20, 4:06 PM

## 2020-07-25 NOTE — Progress Notes (Signed)
eLink Physician-Brief Progress Note Patient Name: [name] DOB: 23-Jun-1940 MRN: [id number]   Date of Service  07/25/2020  HPI/Events of Note  Patient admitted with acute respiratory failure secondary to congestive heart failure, r/o pneumonia and possible lung neoplasm, patient has a pre-existing cavitary lung lesion for which she was receiving chronic antibiotic Rx.  eICU Interventions  New Patient Evaluation.        Kerry Kass [place] 07/25/2020, 12:42 AM

## 2020-07-25 NOTE — Plan of Care (Signed)

## 2020-07-25 NOTE — Progress Notes (Signed)
*  PRELIMINARY RESULTS* Echocardiogram 2D Echocardiogram has been performed.  [name] 07/25/2020, 9:32 AM

## 2020-07-25 NOTE — Progress Notes (Signed)
PULMONARY / CRITICAL CARE MEDICINE  Name: [name] MRN: [id number] DOB: 25-Apr-1940    LOS: 1  Admitting Provider: Arlyss Gandy. Tukov-Yual Reason for Admission:  Acute Hypoxic respiratory failure secondary to COVID-19 infection Brief patient description: Patient presented with dyspnea; ED work-up revealed diffuse lung opacities, bilateral pleural effusions left greater than right suggestive of CHF.  Patient is admitted to the ICU for further management on BiPAP  HPI: 80 year old female with a history of coronary artery disease status postcoronary artery bypass graft x4 vessels in April 6195, systolic heart failure, hyperlipidemia, breast cancer s/p radiation and chemo, peripheral vascular disease and hypertension who presented to the ED via EMS with shortness of breath, weakness and tachycardia.  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  While in the ED, patient became more tachypneic, tachycardic and hypoxic on 4 L nasal cannula and she was transitioned to BiPAP.  She was also given Lasix.  PCCM was called to admit. Upon arrival in the ICU, patient's COVID 19 PCR test came back positive.  Patient is not vaccinated.  Admission labs show VBG with a PO2 of 62, pH of 7.33, and a PCO2 of 47.  She is mildly hypokalemic with a potassium of 3.2, proBNP of 745 and a troponin of 22.  Her CBC is unremarkable. CRP and procalcitonin are pending. Patient is awake, pleasant and offers no complaints besides dyspnea. Review of patient's chart from [facility] shows that she was on HCTZ up till May 2022 but it was discontinued.  She is not currently on any diuretic.  She is on home O2 that she uses during ambulation.  Her last echocardiogram was in April 2022 and showed a left ventricular ejection fraction of 40 to 45%, left ventricular hypertrophy, and elevated left ventricular end-diastolic pressure  SIGNIFICANT  EVENTS: 07/24/20: ADMITTED, COVID-19 Positive; unvaccinated 07/25/20- patient improved , COVID appears to be inactive at this point with CT35. Patient does have a/cHFrEF, ID and Cardio evaluation in progress.   Past Medical History:  Diagnosis Date   Arthritis    Breast cancer ([place]) 2011   radiation- Left   Hypertension    Personal history of radiation therapy    PVD (peripheral vascular disease) ([place])    Past Surgical History:  Procedure Laterality Date   ABDOMINAL HYSTERECTOMY     BREAST BIOPSY Left 12/21/2009   positive, radiation dcis   BREAST EXCISIONAL BIOPSY Left 01/17/2010   lumpectomy DCIS   BREAST LUMPECTOMY     CATARACT EXTRACTION W/ INTRAOCULAR LENS IMPLANT & ANTERIOR VITRECTOMY, BILATERAL     CHOLECYSTECTOMY     COLONOSCOPY WITH PROPOFOL N/A 09/25/2017   Procedure: COLONOSCOPY WITH PROPOFOL;  Surgeon: Lollie Sails, MD;  Location: [facility] ENDOSCOPY;  Service: Endoscopy;  Laterality: N/A;   CORONARY ARTERY BYPASS GRAFT N/A 05/03/2020   Procedure: CORONARY ARTERY BYPASS GRAFTING (CABG) X FOUR ON PUMP USING LEFT INTERNAL MAMMARY ARTERY AND RIGHT ENDOSCOPIC SAPHEOUS VEIN HARVEST CONDUITS;  Surgeon: Melrose Nakayama, MD;  Location: [facility];  Service: Open Heart Surgery;  Laterality: N/A;   DILATION AND CURETTAGE OF UTERUS     JOINT REPLACEMENT Right 01/24/2016   KNEE ARTHROPLASTY Right 01/24/2016   Procedure: COMPUTER ASSISTED TOTAL KNEE ARTHROPLASTY;  Surgeon: Dereck Leep, MD;  Location: [facility] ORS;  Service: Orthopedics;  Laterality: Right;   LEFT HEART CATH AND CORONARY ANGIOGRAPHY N/A 05/01/2020   Procedure: LEFT HEART CATH AND CORONARY ANGIOGRAPHY;  Surgeon: Wellington Hampshire, MD;  Location: [facility];  Service: Cardiovascular;  Laterality: N/A;   stent in Lt leg Left    TEE WITHOUT CARDIOVERSION N/A 05/03/2020   Procedure: TRANSESOPHAGEAL ECHOCARDIOGRAM (TEE);  Surgeon: Melrose Nakayama, MD;  Location: [facility];  Service: Open Heart Surgery;  Laterality:  N/A;   No current facility-administered medications on file prior to encounter.   Current Outpatient Medications on File Prior to Encounter  Medication Sig   aspirin EC 81 MG tablet Take 81 mg by mouth daily.   atorvastatin (LIPITOR) 20 MG tablet Take 1 tablet (20 mg total) by mouth at bedtime.   Calcium Carbonate-Vit D-Min (CALTRATE 600+D PLUS PO) Take 1 tablet by mouth daily.    clopidogrel (PLAVIX) 75 MG tablet Take 1 tablet (75 mg total) by mouth daily.   losartan (COZAAR) 25 MG tablet Take 1 tablet (25 mg total) by mouth daily.   metoprolol succinate (TOPROL-XL) 50 MG 24 hr tablet Take 1 tablet (50 mg total) by mouth in the morning and at bedtime. Take with or immediately following a meal.   potassium chloride SA (KLOR-CON) 20 MEQ tablet Take 20 mEq by mouth daily.   acetaminophen (TYLENOL) 500 MG tablet Take 1,000 mg by mouth every 6 (six) hours as needed for mild pain.   furosemide (LASIX) 20 MG tablet Take 1 tablet (20 mg total) by mouth daily as needed. For weight gain greater than 3 lbs overnight or 5 lbs in one week.    Allergies Allergies  Allergen Reactions   Ibuprofen Hives   Penicillins Hives    Tolerated Ancef in the past   Aleve [Naproxen Sodium] Hives   Avapro  [Irbesartan] Rash    Other reaction(s): Weal   Azithromycin Hives   Nickel Itching    Redness, itching and fluid discharge with nickel earrings   Other Other (See Comments)    Nickel"ears ran fluid and itching" redness and "sensitive" Nickel"ears ran fluid and itching"    Family History Family History  Problem Relation Age of Onset   Hypertension Mother    Arthritis Mother    Gout Mother    Lung cancer Father    Heart attack Sister    Social History  reports that she has quit smoking. She has never used smokeless tobacco. She reports that she does not drink alcohol and does not use drugs.  Review Of Systems:  Constitutional: Positive for generalized weakness, but negative for fever and  chills. Eyes: No visual changes. ENT: No sore throat. Cardiovascular: Denies chest pain but reports palpations and dyspnea Respiratory: Positive for shortness of breath. Gastrointestinal: No abdominal pain.  No nausea, no vomiting.  No diarrhea.  No constipation. Genitourinary: Negative for dysuria. Musculoskeletal: Negative for back pain. Skin: Negative for rash. Neurological: Negative for headaches, focal weakness or numbness VITAL SIGNS: BP 128/70   Pulse 87   Temp 98 F (36.7 C) (Oral)   Resp 17   Wt 52.4 kg   SpO2 96%   BMI 22.56 kg/m   HEMODYNAMICS:   VENTILATOR SETTINGS:  FiO2 (%):  [40 %] 40 %BIPAP FiO2 40%, RR 12  INTAKE / OUTPUT: I/O last 3 completed shifts: In: -  Out: 700 [Urine:700]  PHYSICAL EXAMINATION: General:  Chronically ill-looking, normal WOB HEENT:  PERRLA, mild JVD Neuro:  AAO X 4, moves all extremities Cardiovascular:  AP regular, S1/S2 Lungs: Diffuse rhonchi and crackles in all lung fields Abdomen:  Non-tender, non-distended and  Musculoskeletal:  +rom,  no deformities Skin:  warm and dry  LABS:  BMET Recent Labs  Lab 07/24/20 1545 07/25/20 0411 07/25/20 1529  NA 137 140  --   K 3.2* 2.9* 3.7  CL 104 105  --   CO2 25 27  --   BUN 7* 7*  --   CREATININE 0.64 0.49  --   GLUCOSE 99 96  --      Electrolytes Recent Labs  Lab 07/24/20 1545 07/25/20 0411  CALCIUM 9.1 8.9  MG  --  1.8  PHOS  --  4.7*     CBC Recent Labs  Lab 07/24/20 1545 07/25/20 0411  WBC 4.6 4.8  HGB 12.6 13.2  HCT 37.7 39.3  PLT 191 191     Coag's No results for input(s): APTT, INR in the last 168 hours.  Sepsis Markers Recent Labs  Lab 07/25/20 0411  PROCALCITON <0.10    ABG No results for input(s): PHART, PCO2ART, PO2ART in the last 168 hours.  Liver Enzymes No results for input(s): AST, ALT, ALKPHOS, BILITOT, ALBUMIN in the last 168 hours.  Cardiac Enzymes No results for input(s): TROPONINI, PROBNP in the last 168  hours.  Glucose Recent Labs  Lab 07/25/20 0017  GLUCAP 119*    Imaging CT Angio Chest PE W/Cm &/Or Wo Cm  Addendum Date: 07/24/2020   ADDENDUM REPORT: 07/24/2020 22:21 ADDENDUM: These results were called by telephone at the time of interpretation on 07/24/2020 at 10:21 pm to provider [facility] , who verbally acknowledged these results. Electronically Signed   By: Lovena Le M.D.   On: 07/24/2020 22:21   Result Date: 07/24/2020 CLINICAL DATA:  Shortness of breath which began 5 days ago, nonproductive cough EXAM: CT ANGIOGRAPHY CHEST WITH CONTRAST TECHNIQUE: Multidetector CT imaging of the chest was performed using the standard protocol during bolus administration of intravenous contrast. Multiplanar CT image reconstructions and MIPs were obtained to evaluate the vascular anatomy. CONTRAST:  13m OMNIPAQUE IOHEXOL 350 MG/ML SOLN COMPARISON:  CT 06/27/2020, mammogram 02/02/2018, 03/25/2019 FINDINGS: Cardiovascular: Satisfactory opacification the pulmonary arteries to the segmental level. No pulmonary artery filling defects are identified. Central pulmonary arteries are normal caliber. Evidence of prior sternotomy and CABG. Study is not tailored for evaluation of the vascular is a shin grafts nor the thoracic aorta. Dense calcification of the native coronary arteries. Cardiac size within normal limits. Atherosclerotic plaque within the normal caliber aorta. The left vertebral artery arises directly from the calcified aortic arch. Calcifications present in the proximal great vessels as well. Some minimal reflux of contrast into the IVC. No other major venous abnormality. Mediastinum/Nodes: Calcified mediastinal nodes are present in the subcarinal region. Borderline enlarged mediastinal and hilar nodes are nonspecific though not significantly changed from comparison prior. No other pathologically enlarged mediastinal, hilar or axillary adenopathy. No acute abnormality of the trachea or thoracic  esophagus. Stable appearance of the thyroid gland without concerning dominant nodule. Lungs/Pleura: Pulmonary vascular redistribution with diffuse septal and fissural thickening. Peribronchovascular cuffing noted as well. Some developing hazy ground-glass opacities may reflect combination of edema and atelectasis. Additional more patchy opacity is seen in the periphery of the left upper lobe (4/25) increasing size of bilateral pleural effusions with adjacent passive atelectasis. Redemonstration of a cavitary lesion seen in the posterior left lower lobe measuring 6 x 3.1 cm, previously 5.2 x 3.1 cm when measured at a similar level. This appears thick-walled with some increasing central hypoattenuating material. Upper Abdomen: No acute abnormalities present in the visualized portions of  the upper abdomen. Musculoskeletal: Exaggerated thoracic kyphosis with multilevel discogenic and facet degenerative changes. Prior sternotomy and CABG. Degenerative features in the bilateral shoulders. The osseous structures appear diffusely demineralized which may limit detection of small or nondisplaced fractures. No acute osseous abnormality or suspicious osseous lesion. Stable size and appearance of a 2.3 x 1.7 cm thick-walled cystic appearing mass in the left breast (4/41). Review of the MIP images confirms the above findings. IMPRESSION: 1. No evidence of pulmonary artery embolism. 2. Features suggesting CHF/volume overload with increasing bilateral effusions, pulmonary vascular congestion, venous reflux to the IVC, and developing pulmonary edema. 3. Slight interval increase in size of a persisting cavitary lesion in the left lung base. While this could reflect a persisting lung abscess, underlying mass lesion is not fully excluded. 4. Additional ill-defined ground-glass opacity is noted in the periphery of the left upper lobe (4/26), could reflect some acute infectious or inflammatory process versus satellite lesion. 5.  Aortic  Atherosclerosis (ICD10-I70.0). 6. Native coronary artery atherosclerosis with prior sternotomy, CABG. 7. Stable appearance of a 2.3 cm thick-walled cystic appearing mass in the left breast. May reflect a region of fat necrosis or postsurgical seroma as seen comparison mammography. Electronically Signed: By: Lovena Le M.D. On: 07/24/2020 22:15   ECHOCARDIOGRAM COMPLETE  Result Date: 07/25/2020    ECHOCARDIOGRAM REPORT   Patient Name:   [name] Date of Exam: 07/25/2020 Medical Rec #:  [id number]       Height:       60.0 in Accession #:    [id number]      Weight:       115.5 lb Date of Birth:  1940-11-23       BSA:          1.478 m Patient Age:    80 years        BP:           135/65 mmHg Patient Gender: F               HR:           91 bpm. Exam Location:  [facility] Procedure: 2D Echo, Cardiac Doppler and Color Doppler Indications:     CHF-acute diastolic A25.05  History:         Patient has prior history of Echocardiogram examinations, most                  recent 05/03/2020. Risk Factors:Hypertension. Personal hsitory                  of radiation therapy.  Sonographer:     Sherrie Sport RDCS (AE) Referring Phys:  [id number] [place] Diagnosing Phys: Bartholome Bill MD  Sonographer Comments: Suboptimal apical window. IMPRESSIONS  1. Left ventricular ejection fraction, by estimation, is 20 to 25%. The left ventricle has severely decreased function. The left ventricle demonstrates global hypokinesis. The left ventricular internal cavity size was mildly to moderately dilated. Left ventricular diastolic parameters were normal.  2. Right ventricular systolic function is normal. The right ventricular size is mildly enlarged.  3. Left atrial size was mildly dilated.  4. Right atrial size was mildly dilated.  5. Moderate pleural effusion in the left lateral region.  6. The mitral valve is grossly normal. Moderate mitral valve regurgitation.  7. The aortic valve is grossly normal. Aortic valve regurgitation is  trivial. FINDINGS  Left Ventricle: Left ventricular ejection fraction, by estimation, is 20 to 25%. The left ventricle has severely  decreased function. The left ventricle demonstrates global hypokinesis. The left ventricular internal cavity size was mildly to moderately dilated. There is no left ventricular hypertrophy. Left ventricular diastolic parameters were normal. Right Ventricle: The right ventricular size is mildly enlarged. No increase in right ventricular wall thickness. Right ventricular systolic function is normal. Left Atrium: Left atrial size was mildly dilated. Right Atrium: Right atrial size was mildly dilated. Pericardium: There is no evidence of pericardial effusion. Mitral Valve: The mitral valve is grossly normal. Moderate mitral valve regurgitation. Tricuspid Valve: The tricuspid valve is grossly normal. Tricuspid valve regurgitation is trivial. Aortic Valve: The aortic valve is grossly normal. Aortic valve regurgitation is trivial. Aortic valve mean gradient measures 1.0 mmHg. Aortic valve peak gradient measures 2.1 mmHg. Aortic valve area, by VTI measures 2.98 cm. Pulmonic Valve: The pulmonic valve was not well visualized. Pulmonic valve regurgitation is not visualized. Aorta: The aortic root is normal in size and structure. IAS/Shunts: The atrial septum is grossly normal. Additional Comments: There is a moderate pleural effusion in the left lateral region.  LEFT VENTRICLE PLAX 2D LVIDd:         4.57 cm      Diastology LVIDs:         4.24 cm      LV e' medial:    3.15 cm/s LV PW:         1.12 cm      LV E/e' medial:  26.7 LV IVS:        1.38 cm      LV e' lateral:   7.62 cm/s LVOT diam:     2.00 cm      LV E/e' lateral: 11.0 LV SV:         35 LV SV Index:   24 LVOT Area:     3.14 cm  LV Volumes (MOD) LV vol d, MOD A2C: 82.2 ml LV vol d, MOD A4C: 123.0 ml LV vol s, MOD A2C: 71.1 ml LV vol s, MOD A4C: 81.0 ml LV SV MOD A2C:     11.1 ml LV SV MOD A4C:     123.0 ml LV SV MOD BP:      29.2 ml  LEFT ATRIUM             Index       RIGHT ATRIUM           Index LA diam:        3.60 cm 2.44 cm/m  RA Area:     12.60 cm LA Vol (A2C):   90.8 ml 61.42 ml/m RA Volume:   30.10 ml  20.36 ml/m LA Vol (A4C):   46.4 ml 31.39 ml/m LA Biplane Vol: 70.3 ml 47.56 ml/m  AORTIC VALVE                   PULMONIC VALVE AV Area (Vmax):    2.04 cm    PV Vmax:        0.53 m/s AV Area (Vmean):   2.28 cm    PV Peak grad:   1.1 mmHg AV Area (VTI):     2.98 cm    RVOT Peak grad: 1 mmHg AV Vmax:           73.20 cm/s AV Vmean:          44.700 cm/s AV VTI:            0.119 m AV Peak Grad:      2.1 mmHg  AV Mean Grad:      1.0 mmHg LVOT Vmax:         47.60 cm/s LVOT Vmean:        32.400 cm/s LVOT VTI:          0.113 m LVOT/AV VTI ratio: 0.95  AORTA Ao Root diam: 3.10 cm MITRAL VALVE MV Area (PHT): 6.27 cm    SHUNTS MV Decel Time: 121 msec    Systemic VTI:  0.11 m MV E velocity: 84.00 cm/s  Systemic Diam: 2.00 cm Bartholome Bill MD Electronically signed by Bartholome Bill MD Signature Date/Time: 07/25/2020/11:02:59 AM    Final     STUDIES:  Echo pending  CULTURES: None  ANTIBIOTICS: NONE   LINES/TUBES: PIVs  ASSESSMENT / PLAN:  PULMONARY A: Acute hypoxic respiratory failure COVID-19 + from previous infection  She has acute on chronic HFrEF Cardio evaluation in progress - appreciate input [facility] Repeat TTE with EF 25%  CARDIOVASCULAR A:  Acute/chronic Systolic CHF exacerbation CAD s/p CABG X4 vessels Hypertension P:  Iv lasix 2-D echo Resume all home medications   RENAL A:   Hypokalemia P:   IV potassium   Best Practice: Code Status:Full code Diet: NPO while on BiPAP and heart healthy diet once off BiPAP GI prophylaxis: PPI VTE prophylaxis:  Lovenox  FAMILY  - met with daughter and reviewed plan and findings  Critical care provider statement:    Critical care time (minutes):  33   Critical care time was exclusive of:  Separately billable procedures and  treating other patients    Critical care was necessary to treat or prevent imminent or  life-threatening deterioration of the following conditions:  Acute hypoxemic respiratory failure, Acute on chronic systolic CHF exacerbation multiple comorbid conditions   Critical care was time spent personally by me on the following  activities:  Development of treatment plan with patient or surrogate,  discussions with consultants, evaluation of patient's response to  treatment, examination of patient, obtaining history from patient or  surrogate, ordering and performing treatments and interventions, ordering  and review of laboratory studies and re-evaluation of patient's condition   I assumed direction of critical care for this patient from another  provider in my specialty: no     Ottie Glazier, M.D.  [facility] [place]     07/25/2020, 5:40 PM

## 2020-07-25 NOTE — Consult Note (Addendum)
Cardiology Consultation:   Patient ID: [name] MRN: [id number]; DOB: [birth year]  Admit date: 07/24/2020 Date of Consult: 07/25/2020  PCP:  Derinda Late, MD   [facility] Providers Cardiologist:  None        Patient Profile:   [name] is a 80 y.o. 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 Lanney Gins.  History of Present Illness:   [name] is followed by Dr. Garen Lah for the above cardiac issues. She was admitted 04/2020 to [facility] with exertional angina and chest pain. She underwent LHC showing severe 3V CAD, EF 35-40%. Echo showed EF 40-45% with global hypokinesis and moderate MR. She was transferred to [facility] and underwent CABG with LIMA to LAD, SVG to PDA and sequential OM, SVG to OM1. During the admission a lung mass was found on chest imaging and is following with pulmonology. She went through a lot of stress with husbands passing.   Last seen in clinic 6/9 and was doing well from a cardiac standpoint. Repeat echo was ordered and PRN lasix was ordered. She declined cardiac rehab and escalation of medications.   The patient presented to the ED at [facility] for dyspnea, weakness, and tachycardia. Says symptoms had been worsening over the last 5 days. Denied fever, cough, chest pain. Noted some lower leg swelling. In the ED she was tachycardic and tachypneic on 4L O2. Breathing worsened and she was transitioned to bipap. COVID PCR was positive. Labs showed pO2 62, pH 7.33, PCO2 47. Potassium 3.2, creatinine 0.64, BUN 7, Hgb 12.6, WBC 4.6, pBNP 745, HS troponin 22. Chest CTA showed diffuse lung opacities, bilateral pleural effusions L>R possible CHF, possible infection. The patient was given IV lasix and admitted to the ICU.    Past Medical History:  Diagnosis Date   Arthritis    Breast cancer ([place]) 2011   radiation- Left   Hypertension    Personal history of radiation therapy    PVD (peripheral vascular disease) ([place])     Past Surgical History:  Procedure Laterality Date   ABDOMINAL HYSTERECTOMY     BREAST BIOPSY Left 12/21/2009   positive, radiation dcis   BREAST EXCISIONAL BIOPSY Left 01/17/2010   lumpectomy DCIS   BREAST LUMPECTOMY     CATARACT EXTRACTION W/ INTRAOCULAR LENS IMPLANT & ANTERIOR VITRECTOMY, BILATERAL     CHOLECYSTECTOMY     COLONOSCOPY WITH PROPOFOL N/A 09/25/2017   Procedure: COLONOSCOPY WITH PROPOFOL;  Surgeon: Lollie Sails, MD;  Location: [facility] ENDOSCOPY;  Service: Endoscopy;  Laterality: N/A;   CORONARY ARTERY BYPASS GRAFT N/A 05/03/2020   Procedure: CORONARY ARTERY BYPASS GRAFTING (CABG) X FOUR ON PUMP USING LEFT INTERNAL MAMMARY ARTERY AND RIGHT ENDOSCOPIC SAPHEOUS VEIN HARVEST CONDUITS;  Surgeon: Melrose Nakayama, MD;  Location: [place];  Service: Open Heart Surgery;  Laterality: N/A;   DILATION AND CURETTAGE OF UTERUS     JOINT REPLACEMENT Right 01/24/2016   KNEE ARTHROPLASTY Right 01/24/2016   Procedure: COMPUTER ASSISTED TOTAL KNEE ARTHROPLASTY;  Surgeon: Dereck Leep, MD;  Location: [facility] ORS;  Service: Orthopedics;  Laterality: Right;   LEFT HEART CATH AND CORONARY ANGIOGRAPHY N/A 05/01/2020   Procedure: LEFT HEART CATH AND CORONARY ANGIOGRAPHY;  Surgeon: Wellington Hampshire, MD;  Location:  [place] CV LAB;  Service: Cardiovascular;  Laterality: N/A;   stent in Lt leg Left    TEE WITHOUT CARDIOVERSION N/A 05/03/2020   Procedure: TRANSESOPHAGEAL ECHOCARDIOGRAM (TEE);  Surgeon: Melrose Nakayama, MD;  Location: [place];  Service: Open Heart Surgery;  Laterality: N/A;     Home Medications:  Prior to Admission medications   Medication Sig Start Date End Date Taking? Authorizing Provider  aspirin EC 81 MG tablet Take 81 mg by mouth daily.   Yes [provider]  atorvastatin (LIPITOR) 20 MG tablet Take 1 tablet (20 mg total) by mouth at bedtime. 06/22/20  Yes Visser, Jacquelyn D, PA-C  Calcium Carbonate-Vit D-Min (CALTRATE 600+D PLUS PO) Take 1 tablet by mouth daily.    Yes [provider]  clopidogrel (PLAVIX) 75 MG tablet Take 1 tablet (75 mg total) by mouth daily. 05/22/20 05/17/21 Yes Visser, Jacquelyn D, PA-C  losartan (COZAAR) 25 MG tablet Take 1 tablet (25 mg total) by mouth daily. 05/22/20 05/17/21 Yes Visser, Jacquelyn D, PA-C  metoprolol succinate (TOPROL-XL) 50 MG 24 hr tablet Take 1 tablet (50 mg total) by mouth in the morning and at bedtime. Take with or immediately following a meal. 06/22/20 10/20/20 Yes Visser, Jacquelyn D, PA-C  potassium chloride SA (KLOR-CON) 20 MEQ tablet Take 20 mEq by mouth daily. 05/08/20  Yes [provider]  acetaminophen (TYLENOL) 500 MG tablet Take 1,000 mg by mouth every 6 (six) hours as needed for mild pain.    [provider]  furosemide (LASIX) 20 MG tablet Take 1 tablet (20 mg total) by mouth daily as needed. For weight gain greater than 3 lbs overnight or 5 lbs in one week. 06/22/20   Marrianne Mood D, PA-C    Inpatient Medications: Scheduled Meds:  albuterol  2 puff Inhalation Q4H   aspirin EC  81 mg Oral Daily   [START ON 07/30/2020] atorvastatin  20 mg Oral Daily   calcium citrate-vitamin D   Oral Daily   Chlorhexidine Gluconate Cloth  6 each Topical Q0600   clopidogrel  75 mg Oral Daily   dexamethasone (DECADRON) injection  6 mg Intravenous Q24H   enoxaparin (LOVENOX) injection  40 mg Subcutaneous Q24H   furosemide  40 mg Intravenous Daily   metoprolol succinate  12.5 mg Oral BID   pantoprazole  40 mg Oral Daily   Continuous Infusions:  [START ON 07/26/2020] remdesivir 100 mg in NS 100 mL     PRN Meds: acetaminophen, docusate sodium, nitroGLYCERIN, polyethylene glycol  Allergies:    Allergies  Allergen Reactions   Ibuprofen Hives   Penicillins Hives     Tolerated Ancef in the past   Aleve [Naproxen Sodium] Hives   Avapro  [Irbesartan] Rash    Other reaction(s): Weal   Azithromycin Hives   Nickel Itching    Redness, itching and fluid discharge with nickel earrings   Other Other (See Comments)    Nickel"ears ran fluid and itching" redness and "sensitive" Nickel"ears ran fluid and itching"    Social History:   Social History   Socioeconomic History   Marital status: Married    Spouse name: Not on file   Number of children: Not on file   Years of education: Not on file   Highest education level: Not on file  Occupational History   Not on file  Tobacco Use   Smoking status: Former    Pack years: 0.00   Smokeless tobacco: Never  Vaping Use   Vaping  Use: Never used  Substance and Sexual Activity   Alcohol use: No    Alcohol/week: 0.0 standard drinks   Drug use: No   Sexual activity: Not on file  Other Topics Concern   Not on file  Social History Narrative   Not on file   Social Determinants of Health   Financial Resource Strain: Not on file  Food Insecurity: Not on file  Transportation Needs: Not on file  Physical Activity: Not on file  Stress: Not on file  Social Connections: Not on file  Intimate Partner Violence: Not on file    Family History:    Family History  Problem Relation Age of Onset   Hypertension Mother    Arthritis Mother    Gout Mother    Lung cancer Father    Heart attack Sister      ROS:  Please see the history of present illness.   All other ROS reviewed and negative.     Physical Exam/Data:   Vitals:   07/25/20 1300 07/25/20 1357 07/25/20 1400 07/25/20 1500  BP: 121/63  123/69 128/70  Pulse: 89  90 87  Resp: (!) 21  (!) 21 17  Temp:      TempSrc:      SpO2: 96% 98% 97% 96%  Weight:        Intake/Output Summary (Last 24 hours) at 07/25/2020 1543 Last data filed at 07/25/2020 1443 Gross per 24 hour  Intake 1082.79 ml  Output 2100 ml  Net -1017.21 ml   Last 3 Weights  07/25/2020 06/27/2020 06/22/2020  Weight (lbs) 115 lb 8.3 oz 117 lb 117 lb 2 oz  Weight (kg) 52.4 kg 53.071 kg 53.128 kg     Body mass index is 22.56 kg/m.  General:  Well nourished, well developed, in no acute distress HEENT: normal Lymph: no adenopathy Neck: no JVD Endocrine:  No thryomegaly Vascular: No carotid bruits; FA pulses 2+ bilaterally without bruits  Cardiac:  normal S1, S2; RRR; no murmur  Lungs:  clear to auscultation bilaterally, no wheezing, rhonchi or rales  Abd: soft, nontender, no hepatomegaly  Ext: no edema Musculoskeletal:  No deformities, BUE and BLE strength normal and equal Skin: warm and dry  Neuro:  CNs 2-12 intact, no focal abnormalities noted Psych:  Normal affect   EKG:  The EKG was personally reviewed and demonstrates: ST, 105bmp, LBBB, nonspecific T wave changes Telemetry:  Telemetry was personally reviewed and demonstrates:  SR, HR 80s, PACs  Relevant CV Studies:  Echo 07/25/20  1. Left ventricular ejection fraction, by estimation, is 20 to 25%. The  left ventricle has severely decreased function. The left ventricle  demonstrates global hypokinesis. The left ventricular internal cavity size  was mildly to moderately dilated. Left  ventricular diastolic parameters were normal.   2. Right ventricular systolic function is normal. The right ventricular  size is mildly enlarged.   3. Left atrial size was mildly dilated.   4. Right atrial size was mildly dilated.   5. Moderate pleural effusion in the left lateral region.   6. The mitral valve is grossly normal. Moderate mitral valve  regurgitation.   7. The aortic valve is grossly normal. Aortic valve regurgitation is  trivial.    Intraoperative TEE 5/57/32 Complications: No known complications during this procedure.  POST-OP IMPRESSIONS  - Left Ventricle: The left ventricle is unchanged from pre-bypass. The  cavity  size was normal.  - Right Ventricle: The right ventricle appears unchanged from  pre-bypass.  -  Aorta: The aorta appears unchanged from pre-bypass.  - Left Atrium: The left atrium appears unchanged from pre-bypass.  - Left Atrial Appendage: The left atrial appendage appears unchanged from  pre-bypass.  - Aortic Valve: The aortic valve appears unchanged from pre-bypass.  - Mitral Valve: There is mild regurgitation.  - Tricuspid Valve: The tricuspid valve appears unchanged from pre-bypass.  - Pulmonic Valve: The pulmonic valve appears unchanged from pre-bypass.  - Interatrial Septum: The interatrial septum appears unchanged from  pre-bypass.  - Interventricular Septum: The interventricular septum appears unchanged  from  pre-bypass.  - Pericardium: The pericardium appears unchanged from pre-bypass.  - Comments: Post-bypass images reviewed with surgeon.   Echo 05/02/20 1. Left ventricular ejection fraction, by estimation, is 40 to 45%. The  left ventricle has mildly decreased function. The left ventricle  demonstrates global hypokinesis. There is mild concentric left ventricular  hypertrophy. Indeterminate diastolic  filling due to E-A fusion. Elevated left ventricular end-diastolic  pressure.   2. Right ventricular systolic function is normal. The right ventricular  size is normal. There is mildly elevated pulmonary artery systolic  pressure. The estimated right ventricular systolic pressure is 16.1 mmHg.   3. Left atrial size was moderately dilated.   4. The mitral valve is normal in structure. Moderate mitral valve  regurgitation.   5. The aortic valve is grossly normal. Aortic valve regurgitation is not  visualized.   6. The inferior vena cava is normal in size with greater than 50%  respiratory variability, suggesting right atrial pressure of 3 mmHg.   Cardiac cath 05/01/20  There is moderate left ventricular systolic dysfunction. LV end diastolic pressure is mildly elevated. The left ventricular ejection fraction is 35-45% by visual estimate. Ost RCA to  Prox RCA lesion is 60% stenosed. Mid RCA to Dist RCA lesion is 100% stenosed. Ost LM to Dist LM lesion is 80% stenosed. Ost Cx to Prox Cx lesion is 80% stenosed. Ost LAD to Prox LAD lesion is 80% stenosed.   1.  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  Significant pressure dampening with catheter engagement of the left main coronary artery. 2.  Moderately reduced LV systolic function.  Mildly elevated left ventricular end-diastolic pressure.   Recommendations: Recommend transfer to [facility] for CABG evaluation. Obtain an echocardiogram to better evaluate LV systolic function and valvular abnormalities. Resume heparin 6 hours after sheath pull.    Laboratory Data:  High Sensitivity Troponin:   Recent Labs  Lab 07/24/20 1545  TROPONINIHS 22*     Chemistry Recent Labs  Lab 07/24/20 1545 07/25/20 0411  NA 137 140  K 3.2* 2.9*  CL 104 105  CO2 25 27  GLUCOSE 99 96  BUN 7* 7*  CREATININE 0.64 0.49  CALCIUM 9.1 8.9  GFRNONAA >60 >60  ANIONGAP 8 8    No results for input(s): PROT, ALBUMIN, AST, ALT, ALKPHOS, BILITOT in the last 168 hours. Hematology Recent Labs  Lab 07/24/20 1545 07/25/20 0411  WBC 4.6 4.8  RBC 4.02 4.17  HGB 12.6 13.2  HCT 37.7 39.3  MCV 93.8 94.2  MCH 31.3 31.7  MCHC 33.4 33.6  RDW 15.9* 16.2*  PLT 191 191   BNP Recent Labs  Lab 07/24/20 1545  BNP 745.6*    DDimer No results for input(s): DDIMER in the last 168 hours.   Radiology/Studies:  DG Chest 2 View  Result Date: 07/24/2020 CLINICAL DATA:  Shortness of breath EXAM: CHEST - 2 VIEW COMPARISON:  Chest CT  June 27, 2020 FINDINGS: Normal size heart. Nodular mediastinal contour likely reflecting adenopathy seen on prior CT. Prior median sternotomy and CABG. Similar size of the cavitary mass like lesion in the posterior left lower lobe. Increased size of the a small left pleural effusion with a new small right pleural effusion. Mild diffuse interstitial opacities.  IMPRESSION: 1. Increased size of the small left pleural effusion with a new small right pleural effusion, with possible mild interstitial edema. 2. Similar size of the mass like lesion in the posterior left lower lobe. Electronically Signed   By: Dahlia Bailiff MD   On: 07/24/2020 16:54   CT Angio Chest PE W/Cm &/Or Wo Cm  Addendum Date: 07/24/2020   ADDENDUM REPORT: 07/24/2020 22:21 ADDENDUM: These results were called by telephone at the time of interpretation on 07/24/2020 at 10:21 pm to provider [facility] , who verbally acknowledged these results. Electronically Signed   By: Lovena Le M.D.   On: 07/24/2020 22:21   Result Date: 07/24/2020 CLINICAL DATA:  Shortness of breath which began 5 days ago, nonproductive cough EXAM: CT ANGIOGRAPHY CHEST WITH CONTRAST TECHNIQUE: Multidetector CT imaging of the chest was performed using the standard protocol during bolus administration of intravenous contrast. Multiplanar CT image reconstructions and MIPs were obtained to evaluate the vascular anatomy. CONTRAST:  165mL OMNIPAQUE IOHEXOL 350 MG/ML SOLN COMPARISON:  CT 06/27/2020, mammogram 02/02/2018, 03/25/2019 FINDINGS: Cardiovascular: Satisfactory opacification the pulmonary arteries to the segmental level. No pulmonary artery filling defects are identified. Central pulmonary arteries are normal caliber. Evidence of prior sternotomy and CABG. Study is not tailored for evaluation of the vascular is a shin grafts nor the thoracic aorta. Dense calcification of the native coronary arteries. Cardiac size within normal limits. Atherosclerotic plaque within the normal caliber aorta. The left vertebral artery arises directly from the calcified aortic arch. Calcifications present in the proximal great vessels as well. Some minimal reflux of contrast into the IVC. No other major venous abnormality. Mediastinum/Nodes: Calcified mediastinal nodes are present in the subcarinal region. Borderline enlarged mediastinal and  hilar nodes are nonspecific though not significantly changed from comparison prior. No other pathologically enlarged mediastinal, hilar or axillary adenopathy. No acute abnormality of the trachea or thoracic esophagus. Stable appearance of the thyroid gland without concerning dominant nodule. Lungs/Pleura: Pulmonary vascular redistribution with diffuse septal and fissural thickening. Peribronchovascular cuffing noted as well. Some developing hazy ground-glass opacities may reflect combination of edema and atelectasis. Additional more patchy opacity is seen in the periphery of the left upper lobe (4/25) increasing size of bilateral pleural effusions with adjacent passive atelectasis. Redemonstration of a cavitary lesion seen in the posterior left lower lobe measuring 6 x 3.1 cm, previously 5.2 x 3.1 cm when measured at a similar level. This appears thick-walled with some increasing central hypoattenuating material. Upper Abdomen: No acute abnormalities present in the visualized portions of the upper abdomen. Musculoskeletal: Exaggerated thoracic kyphosis with multilevel discogenic and facet degenerative changes. Prior sternotomy and CABG. Degenerative features in the bilateral shoulders. The osseous structures appear diffusely demineralized which may limit detection of small or nondisplaced fractures. No acute osseous abnormality or suspicious osseous lesion. Stable size and appearance of a 2.3 x 1.7 cm thick-walled cystic appearing mass in the left breast (4/41). Review of the MIP images confirms the above findings. IMPRESSION: 1. No evidence of pulmonary artery embolism. 2. Features suggesting CHF/volume overload with increasing bilateral effusions, pulmonary vascular congestion, venous reflux to the IVC, and developing pulmonary edema. 3. Slight interval increase  in size of a persisting cavitary lesion in the left lung base. While this could reflect a persisting lung abscess, underlying mass lesion is not fully  excluded. 4. Additional ill-defined ground-glass opacity is noted in the periphery of the left upper lobe (4/26), could reflect some acute infectious or inflammatory process versus satellite lesion. 5.  Aortic Atherosclerosis (ICD10-I70.0). 6. Native coronary artery atherosclerosis with prior sternotomy, CABG. 7. Stable appearance of a 2.3 cm thick-walled cystic appearing mass in the left breast. May reflect a region of fat necrosis or postsurgical seroma as seen comparison mammography. Electronically Signed: By: Lovena Le M.D. On: 07/24/2020 22:15   ECHOCARDIOGRAM COMPLETE  Result Date: 07/25/2020    ECHOCARDIOGRAM REPORT   Patient Name:   [name] Date of Exam: 07/25/2020 Medical Rec #:  [id number]       Height:       60.0 in Accession #:    [id number]      Weight:       115.5 lb Date of Birth:  [birth year]       BSA:          1.478 m Patient Age:    71 years        BP:           135/65 mmHg Patient Gender: F               HR:           91 bpm. Exam Location:  [facility] Procedure: 2D Echo, Cardiac Doppler and Color Doppler Indications:     CHF-acute diastolic W10.27  History:         Patient has prior history of Echocardiogram examinations, most                  recent 05/03/2020. Risk Factors:Hypertension. Personal hsitory                  of radiation therapy.  Sonographer:     Sherrie Sport RDCS (AE) Referring Phys:  [id number] [place] Diagnosing Phys: Bartholome Bill MD  Sonographer Comments: Suboptimal apical window. IMPRESSIONS  1. Left ventricular ejection fraction, by estimation, is 20 to 25%. The left ventricle has severely decreased function. The left ventricle demonstrates global hypokinesis. The left ventricular internal cavity size was mildly to moderately dilated. Left ventricular diastolic parameters were normal.  2. Right ventricular systolic function is normal. The right ventricular size is mildly enlarged.  3. Left atrial size was mildly dilated.  4. Right atrial size was mildly  dilated.  5. Moderate pleural effusion in the left lateral region.  6. The mitral valve is grossly normal. Moderate mitral valve regurgitation.  7. The aortic valve is grossly normal. Aortic valve regurgitation is trivial. FINDINGS  Left Ventricle: Left ventricular ejection fraction, by estimation, is 20 to 25%. The left ventricle has severely decreased function. The left ventricle demonstrates global hypokinesis. The left ventricular internal cavity size was mildly to moderately dilated. There is no left ventricular hypertrophy. Left ventricular diastolic parameters were normal. Right Ventricle: The right ventricular size is mildly enlarged. No increase in right ventricular wall thickness. Right ventricular systolic function is normal. Left Atrium: Left atrial size was mildly dilated. Right Atrium: Right atrial size was mildly dilated. Pericardium: There is no evidence of pericardial effusion. Mitral Valve: The mitral valve is grossly normal. Moderate mitral valve regurgitation. Tricuspid Valve: The tricuspid valve is grossly normal. Tricuspid valve regurgitation is trivial. Aortic Valve: The aortic valve is  grossly normal. Aortic valve regurgitation is trivial. Aortic valve mean gradient measures 1.0 mmHg. Aortic valve peak gradient measures 2.1 mmHg. Aortic valve area, by VTI measures 2.98 cm. Pulmonic Valve: The pulmonic valve was not well visualized. Pulmonic valve regurgitation is not visualized. Aorta: The aortic root is normal in size and structure. IAS/Shunts: The atrial septum is grossly normal. Additional Comments: There is a moderate pleural effusion in the left lateral region.  LEFT VENTRICLE PLAX 2D LVIDd:         4.57 cm      Diastology LVIDs:         4.24 cm      LV e' medial:    3.15 cm/s LV PW:         1.12 cm      LV E/e' medial:  26.7 LV IVS:        1.38 cm      LV e' lateral:   7.62 cm/s LVOT diam:     2.00 cm      LV E/e' lateral: 11.0 LV SV:         35 LV SV Index:   24 LVOT Area:     3.14  cm  LV Volumes (MOD) LV vol d, MOD A2C: 82.2 ml LV vol d, MOD A4C: 123.0 ml LV vol s, MOD A2C: 71.1 ml LV vol s, MOD A4C: 81.0 ml LV SV MOD A2C:     11.1 ml LV SV MOD A4C:     123.0 ml LV SV MOD BP:      29.2 ml LEFT ATRIUM             Index       RIGHT ATRIUM           Index LA diam:        3.60 cm 2.44 cm/m  RA Area:     12.60 cm LA Vol (A2C):   90.8 ml 61.42 ml/m RA Volume:   30.10 ml  20.36 ml/m LA Vol (A4C):   46.4 ml 31.39 ml/m LA Biplane Vol: 70.3 ml 47.56 ml/m  AORTIC VALVE                   PULMONIC VALVE AV Area (Vmax):    2.04 cm    PV Vmax:        0.53 m/s AV Area (Vmean):   2.28 cm    PV Peak grad:   1.1 mmHg AV Area (VTI):     2.98 cm    RVOT Peak grad: 1 mmHg AV Vmax:           73.20 cm/s AV Vmean:          44.700 cm/s AV VTI:            0.119 m AV Peak Grad:      2.1 mmHg AV Mean Grad:      1.0 mmHg LVOT Vmax:         47.60 cm/s LVOT Vmean:        32.400 cm/s LVOT VTI:          0.113 m LVOT/AV VTI ratio: 0.95  AORTA Ao Root diam: 3.10 cm MITRAL VALVE MV Area (PHT): 6.27 cm    SHUNTS MV Decel Time: 121 msec    Systemic VTI:  0.11 m MV E velocity: 84.00 cm/s  Systemic Diam: 2.00 cm Bartholome Bill MD Electronically signed by Bartholome Bill MD Signature Date/Time: 07/25/2020/11:02:59 AM    Final  Assessment and Plan:   Acute respiratory failure COVID 19 infection - presented with SOB found to be tachycardic, tachypneic and placed on bipap in the ER. COVID positive. Chest CTA showed no PE, bilateral effusions, pulmonary vascular congestion, pulmonary edema, possible acute infection - s/p IV lasix - on supplemental O2 - IV steroids and remdesivir per CCM  Acute on chronic HFrEF Mild pulmonary HTN Moderate MR - PTA lasix PRN. Patient unwilling to escalate GDMT at last follow-up - Prior EF down to 35-40% by cath 05/01/20 and 40-45% by echo 4/19 - echo this admission showed LVEF 20-25%, global hypokinesis, mildly enlarged RV, mildly dilated biatrium, moderate MR - IV lasix 40mg   daily - UOP -1.4L - continue PTA Toprol XL 50mg  daily, Losartan 25mg  daily. Would transition Losartan to Entresto.  - continue daily weights, strict I/Os, monitor kidney function with diuresis - will need ischemic evaluation to evaluate low EF  CAD s/p CABG (LIMA to LAD, SVG to PDA, sequential OM SVG to OM1) 04/2020 - No chest pain reported, EKG unchanged - Echo with reduced EF 20-25% as above - continue Aspirin, plavix, statin, BB - she will need ischemic evaluation as above  HTN - bps good - continue home meds  HLD - continue atorvastatin - LDL 32  Lung lesion - found incidentally on chest CT, followed by pulmonology  Hypokalemia - replete as needed - goal>4   For questions or updates, please contact [facility] Please consult [email or web address] for contact info under    Signed, Raunel Dimartino Ninfa [place], PA-C  07/25/2020 3:43 PM

## 2020-07-25 NOTE — Telephone Encounter (Signed)
Called daughter back. She wanted to make Marrianne Mood, PA aware that her mom is in the ICU at [facility] since she knows her history. Daughter was unable to talk for very long and just asked that I make her aware.

## 2020-07-25 NOTE — Telephone Encounter (Signed)
Patient daughter calling mother is in ICU diagnosed with heart failure& wants to speak regarding mother. Please assist

## 2020-07-25 NOTE — Consult Note (Signed)
NAME: [name]  DOB: [birth year]  MRN: [id number]  Date/Time: 07/25/2020 3:23 PM  REQUESTING PROVIDER: Lanney Gins Subjective:  REASON FOR CONSULT: COVID illness ? TARREN Sims is a 80 y.o. female with a history of CAD, CHFs/p CABGX 4 ( left main and hthree vessels) on 05/03/20 , PAD, HTN, hyperlipidemia, left lower lobe cavitary lesion noted in April 2022 , which had improved in June 2022  Presented to the ED with 5 day history of SOB on 07/24/20 Vitals in the ED temp of 97.7, BP 140/89, HR 119 and sats of 97% on 4 l oxygen. Labs revealed Wbc of 4.6, Hb 12.6, PLT 191 and cr 0.64 She is in the ICU  CXR showed Normal size heart. Nodular mediastinal contour likely reflecting adenopathy seen on prior CT. Prior median sternotomy and CABG.Similar size of the cavitary mass like lesion in the posterior left lower lobe. Increased size of the a small left pleural effusion with a new small right pleural effusion. Mild diffuse interstitial opacities.she has been diagnosed with CHF and started on lasix 40mg  Q12 .   She  incidentally tested positive for SARS cov2 and started on remdisivir and decadron and I am asked to see her  Pt says she does not have any fever, cough- she has SOB She has  had a rough few months- in april 2022 she had CP /SOB, NSTEMI  and cardiac cath showed left main disease and triple vessel disease an she underwent CABG X4 That hospitalization A CT she was noted to have a thich walled left lower lobe cavitating mass incidentally in CT chest and It is  being observed  Mid June pt went camping with her whole family to the beach . They stayed in a rented house.  and one member got sick with cold, fever and others were sick consecutively for a day or so .Pt had to leave the vacation on day 3 as her brother died in [place]. She had diarrhea and vomiting for a day and got better.No one got tested for covid- pt has not been vaccinated She has h/o left breast ca- s/p lumpectomy 01/17/2010  and  radiation   Past Medical History:  Diagnosis Date   Arthritis    Breast cancer ([place]) 2011   radiation- Left   Hypertension    Personal history of radiation therapy    PVD (peripheral vascular disease) ([place])     Past Surgical History:  Procedure Laterality Date   ABDOMINAL HYSTERECTOMY     BREAST BIOPSY Left 12/21/2009   positive, radiation dcis   BREAST EXCISIONAL BIOPSY Left 01/17/2010   lumpectomy DCIS   BREAST LUMPECTOMY     CATARACT EXTRACTION W/ INTRAOCULAR LENS IMPLANT & ANTERIOR VITRECTOMY, BILATERAL     CHOLECYSTECTOMY     COLONOSCOPY WITH PROPOFOL N/A 09/25/2017   Procedure: COLONOSCOPY WITH PROPOFOL;  Surgeon: Lollie Sails, MD;  Location: [facility] ENDOSCOPY;  Service: Endoscopy;  Laterality: N/A;   CORONARY ARTERY BYPASS GRAFT N/A 05/03/2020   Procedure: CORONARY ARTERY BYPASS GRAFTING (CABG) X FOUR ON PUMP USING LEFT INTERNAL MAMMARY ARTERY AND RIGHT ENDOSCOPIC SAPHEOUS VEIN HARVEST CONDUITS;  Surgeon: Melrose Nakayama, MD;  Location: [facility];  Service: Open Heart Surgery;  Laterality: N/A;   DILATION AND CURETTAGE OF UTERUS     JOINT REPLACEMENT Right 01/24/2016   KNEE ARTHROPLASTY Right 01/24/2016   Procedure: COMPUTER ASSISTED TOTAL KNEE ARTHROPLASTY;  Surgeon: Dereck Leep, MD;  Location: [facility] ORS;  Service: Orthopedics;  Laterality: Right;  LEFT HEART CATH AND CORONARY ANGIOGRAPHY N/A 05/01/2020   Procedure: LEFT HEART CATH AND CORONARY ANGIOGRAPHY;  Surgeon: Wellington Hampshire, MD;  Location: [facility];  Service: Cardiovascular;  Laterality: N/A;   stent in Lt leg Left    TEE WITHOUT CARDIOVERSION N/A 05/03/2020   Procedure: TRANSESOPHAGEAL ECHOCARDIOGRAM (TEE);  Surgeon: Melrose Nakayama, MD;  Location: [facility];  Service: Open Heart Surgery;  Laterality: N/A;    Social History   Socioeconomic History   Marital status: Married    Spouse name: Not on file   Number of children: Not on file   Years of education: Not on file   Highest education  level: Not on file  Occupational History   Not on file  Tobacco Use   Smoking status: Former    Pack years: 0.00   Smokeless tobacco: Never  Vaping Use   Vaping Use: Never used  Substance and Sexual Activity   Alcohol use: No    Alcohol/week: 0.0 standard drinks   Drug use: No   Sexual activity: Not on file  Other Topics Concern   Not on file  Social History Narrative   Not on file   Social Determinants of Health   Financial Resource Strain: Not on file  Food Insecurity: Not on file  Transportation Needs: Not on file  Physical Activity: Not on file  Stress: Not on file  Social Connections: Not on file  Intimate Partner Violence: Not on file    Family History  Problem Relation Age of Onset   Hypertension Mother    Arthritis Mother    Gout Mother    Lung cancer Father    Heart attack Sister    Allergies  Allergen Reactions   Ibuprofen Hives   Penicillins Hives    Tolerated Ancef in the past   Aleve [Naproxen Sodium] Hives   Avapro  [Irbesartan] Rash    Other reaction(s): Weal   Azithromycin Hives   Nickel Itching    Redness, itching and fluid discharge with nickel earrings   Other Other (See Comments)    Nickel"ears ran fluid and itching" redness and "sensitive" Nickel"ears ran fluid and itching"   I? Current Facility-Administered Medications  Medication Dose Route Frequency Provider Last Rate Last Admin   acetaminophen (TYLENOL) tablet 650 mg  650 mg Oral Q4H PRN Tukov-Yual, Magdalene S, NP       albuterol (VENTOLIN HFA) 108 (90 Base) MCG/ACT inhaler 2 puff  2 puff Inhalation Q4H Renda Rolls, RPH   2 puff at 07/25/20 1158   aspirin EC tablet 81 mg  81 mg Oral Daily Tukov-Yual, Magdalene S, NP   81 mg at 07/25/20 0946   [START ON 07/30/2020] atorvastatin (LIPITOR) tablet 20 mg  20 mg Oral Daily Belue, Alver Sorrow, RPH       calcium citrate-vitamin D 500-500 MG-UNIT per chewable tablet   Oral Daily Tukov-Yual, Magdalene S, NP   1 tablet at 07/25/20 0946    Chlorhexidine Gluconate Cloth 2 % PADS 6 each  6 each Topical Q0600 Ottie Glazier, MD       clopidogrel (PLAVIX) tablet 75 mg  75 mg Oral Daily Tukov-Yual, Magdalene S, NP   75 mg at 07/25/20 0946   dexamethasone (DECADRON) injection 6 mg  6 mg Intravenous Q24H Tukov-Yual, Magdalene S, NP   6 mg at 07/25/20 0741   docusate sodium (COLACE) capsule 100 mg  100 mg Oral BID PRN Tukov-Yual, Arlyss Gandy, NP  enoxaparin (LOVENOX) injection 40 mg  40 mg Subcutaneous Q24H Renda Rolls, RPH   40 mg at 07/25/20 0742   furosemide (LASIX) injection 40 mg  40 mg Intravenous Daily Benita Gutter, RPH   40 mg at 07/25/20 1104   metoprolol succinate (TOPROL-XL) 24 hr tablet 12.5 mg  12.5 mg Oral BID Ottie Glazier, MD       nitroGLYCERIN (NITROSTAT) SL tablet 0.4 mg  0.4 mg Sublingual Q5 min PRN Blake Divine, MD   0.4 mg at 07/24/20 2247   pantoprazole (PROTONIX) EC tablet 40 mg  40 mg Oral Daily Tukov-Yual, Magdalene S, NP   40 mg at 07/25/20 0946   polyethylene glycol (MIRALAX / GLYCOLAX) packet 17 g  17 g Oral Daily PRN Tukov-Yual, Arlyss Gandy, NP       [START ON 07/26/2020] remdesivir 100 mg in sodium chloride 0.9 % 100 mL IVPB  100 mg Intravenous Daily Bradly Bienenstock, NP         Abtx:  Anti-infectives (From admission, onward)    Start     Dose/Rate Route Frequency Ordered Stop   07/26/20 1000  remdesivir 100 mg in sodium chloride 0.9 % 100 mL IVPB       See Hyperspace for full Linked Orders Report.   100 mg 200 mL/hr over 30 Minutes Intravenous Daily 07/25/20 0925 07/30/20 0959   07/25/20 1015  remdesivir 200 mg in sodium chloride 0.9% 250 mL IVPB       See Hyperspace for full Linked Orders Report.   200 mg 580 mL/hr over 30 Minutes Intravenous Once 07/25/20 0925 07/25/20 1339   07/25/20 1000  nirmatrelvir/ritonavir EUA (PAXLOVID) TABS 3 tablet  Status:  Discontinued        3 tablet Oral 2 times daily 07/25/20 0529 07/25/20 0541   07/25/20 1000  nirmatrelvir/ritonavir EUA (renal  dosing) (PAXLOVID) TABS 2 tablet  Status:  Discontinued        2 tablet Oral 2 times daily 07/25/20 0542 07/25/20 0924       REVIEW OF SYSTEMS:  Const: negative fever, negative chills, some  weight loss following surgery Eyes: negative diplopia or visual changes, negative eye pain ENT: negative coryza, negative sore throat Resp: negative cough, hemoptysis, has dyspnea Cards:  chest pain, palpitations, lower extremity edema GU: negative for frequency, dysuria and hematuria GI: Negative for abdominal pain, diarrhea, bleeding, constipation Skin: negative for rash and pruritus Heme: negative for easy bruising and gum/nose bleeding MS: generalized weakness Neurolo:negative for headaches, dizziness, vertigo, memory problems  Psych: negative for feelings of anxiety, depression  Endocrine: negative for thyroid, diabetes Allergy/Immunology- as above Objective:  VITALS:  BP 123/69   Pulse 90   Temp 98 F (36.7 C) (Oral)   Resp (!) 21   Wt 52.4 kg   SpO2 97%   BMI 22.56 kg/m  PHYSICAL EXAM:  General: Alert, cooperative, no distress, appears stated age. Sitting in chair and reading- Head: Normocephalic, without obvious abnormality, atraumatic. Eyes: Conjunctivae clear, anicteric sclerae. Pupils are equal ENT Nares normal. No drainage or sinus tenderness. Lips, mucosa, and tongue normal. No Thrush Neck: Supple, symmetrical, no adenopathy, thyroid: non tender no carotid bruit and no JVD. Back: No CVA tenderness. Lungs: b/la ir entry- decreased bases Heart: Tachycardia Sternal scar healed well Did not palpate her breasts. Abdomen: Soft, could not examine well because she was sitting Extremities: 1 + ankle edema Skin: No rashes or lesions. Or bruising Lymph: Cervical, supraclavicular normal.no axillary lymphnodes Neurologic: Grossly non-focal  Pertinent Labs Lab Results CBC    Component Value Date/Time   WBC 4.8 07/25/2020 0411   RBC 4.17 07/25/2020 0411   HGB 13.2 07/25/2020  0411   HGB 11.9 (L) 04/11/2014 1501   HCT 39.3 07/25/2020 0411   HCT 34.0 (L) 04/11/2014 1501   PLT 191 07/25/2020 0411   PLT 98 (L) 04/11/2014 1501   MCV 94.2 07/25/2020 0411   MCV 102 (H) 04/11/2014 1501   MCH 31.7 07/25/2020 0411   MCHC 33.6 07/25/2020 0411   RDW 16.2 (H) 07/25/2020 0411   RDW 12.5 04/11/2014 1501   LYMPHSABS 2.0 07/11/2016 1330   LYMPHSABS 1.6 04/11/2014 1501   MONOABS 0.6 07/11/2016 1330   MONOABS 0.5 04/11/2014 1501   EOSABS 0.2 07/11/2016 1330   EOSABS 0.1 04/11/2014 1501   BASOSABS 0.0 07/11/2016 1330   BASOSABS 0.0 04/11/2014 1501    CMP Latest Ref Rng & Units 07/25/2020 07/24/2020 05/22/2020  Glucose 70 - 99 mg/dL 96 99 101(H)  BUN 8 - 23 mg/dL 7(L) 7(L) 8  Creatinine 0.44 - 1.00 mg/dL 0.49 0.64 0.55  Sodium 135 - 145 mmol/L 140 137 134(L)  Potassium 3.5 - 5.1 mmol/L 2.9(L) 3.2(L) 4.1  Chloride 98 - 111 mmol/L 105 104 100  CO2 22 - 32 mmol/L 27 25 26   Calcium 8.9 - 10.3 mg/dL 8.9 9.1 9.7  Total Protein 6.5 - 8.1 g/dL - - -  Total Bilirubin 0.3 - 1.2 mg/dL - - -  Alkaline Phos 38 - 126 U/L - - -  AST 15 - 41 U/L - - -  ALT 0 - 44 U/L - - -      Microbiology: Recent Results (from the past 240 hour(s))  Resp Panel by RT-PCR (Flu A&B, Covid) Nasopharyngeal Swab     Status: Abnormal   Collection Time: 07/24/20 10:45 PM   Specimen: Nasopharyngeal Swab; Nasopharyngeal(NP) swabs in vial transport medium  Result Value Ref Range Status   SARS Coronavirus 2 by RT PCR POSITIVE (A) NEGATIVE Final    Comment: RESULT CALLED TO, READ BACK BY AND VERIFIED WITH: Meliton Rattan @0012  on 07/25/20 skl (NOTE) SARS-CoV-2 target nucleic acids are DETECTED.  The SARS-CoV-2 RNA is generally detectable in upper respiratory specimens during the acute phase of infection. Positive results are indicative of the presence of the identified virus, but do not rule out bacterial infection or co-infection with other pathogens not detected by the test. Clinical correlation with  patient history and other diagnostic information is necessary to determine patient infection status. The expected result is Negative.  Fact Sheet for Patients: EntrepreneurPulse.com.au  Fact Sheet for Healthcare Providers: IncredibleEmployment.be  This test is not yet approved or cleared by the [place] FDA and  has been authorized for detection and/or diagnosis of SARS-CoV-2 by FDA under an Emergency Use Authorization (EUA).  This EUA will remain in effect (meaning this test can b e used) for the duration of  the COVID-19 declaration under Section 564(b)(1) of the Act, 21 U.S.C. section 360bbb-3(b)(1), unless the authorization is terminated or revoked sooner.     Influenza A by PCR NEGATIVE NEGATIVE Final   Influenza B by PCR NEGATIVE NEGATIVE Final    Comment: (NOTE) The Xpert Xpress SARS-CoV-2/FLU/RSV plus assay is intended as an aid in the diagnosis of influenza from Nasopharyngeal swab specimens and should not be used as a sole basis for treatment. Nasal washings and aspirates are unacceptable for Xpert Xpress SARS-CoV-2/FLU/RSV testing.  Fact Sheet for Patients: EntrepreneurPulse.com.au  Fact  Sheet for Healthcare Providers: IncredibleEmployment.be  This test is not yet approved or cleared by the [place] and has been authorized for detection and/or diagnosis of SARS-CoV-2 by FDA under an Emergency Use Authorization (EUA). This EUA will remain in effect (meaning this test can be used) for the duration of the COVID-19 declaration under Section 564(b)(1) of the Act, 21 U.S.C. section 360bbb-3(b)(1), unless the authorization is terminated or revoked.  Performed at [facility], [address] [place], [place] [address]   MRSA Next Gen by PCR, Nasal     Status: None   Collection Time: 07/25/20 12:57 AM   Specimen: Nasal Mucosa; Nasal Swab  Result Value Ref Range Status    MRSA by PCR Next Gen NOT DETECTED NOT DETECTED Final    Comment: (NOTE) The GeneXpert MRSA Assay (FDA approved for NASAL specimens only), is one component of a comprehensive MRSA colonization surveillance program. It is not intended to diagnose MRSA infection nor to guide or monitor treatment for MRSA infections. Test performance is not FDA approved in patients less than 47 years old. Performed at [facility], [place], [place] [address]   Ct value 35.3  IMAGING RESULTS: CXR -Nodular mediastinal contour likely reflecting adenopathy seen on prior CT. Prior median sternotomy and CABG.Similar size of the cavitary mass like lesion in the posterior left lower lobe. Increased size of the a small left pleural effusion with a new small right pleural effusion. Mild diffuse interstitial opacities.   I have personally reviewed the films ? Impression/Recommendation ?80 yr female admitted with sob Acute hypoxic resp failure- likely a combination of CHF , ischemic cardiomyopathy left lung mass COVID is less likely to be contributing to it She has already improved with laisx 40mg   Incidental; diagnosis of COVID- ct vale 34.9 indicating an old infection and not an active one Can stop remdisivir and decadron Regarding Dcing airborne will discuss with IP as she has not had a documented positive test in the past eventhough by history she likely had it mid June - will follow hospital protocol  Left lung thick walled cavitating mass- not changed much since April- rather slightly bigger now- This could be a malignancy- primary or mets from h/o left breast cancer Would not think this is an infection unless malignancy ruled out -  need a biopsy to confirm either  Cystic lesion noted in left breast on CT- 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

## 2020-07-25 NOTE — Progress Notes (Signed)
Patient BP 109/58, MAP 75. Patient alert and awake. Diastolic decreased 66+ points post diuresis today. Metoprolol held at this time. Will continue to monitor closely.

## 2020-07-25 NOTE — Progress Notes (Signed)
Updated pt's daughter Helene Kelp via telephone on her mother's clinical status and plan of care for today.  We discussed that the COVID infection is likely older, as CT threshold 35.3, and that primary contributor to respiratory distress is likely CHF. We are diuresing today with hope that she will be able to wean from BiPAP soon.  ID is being consulted for further recommendations for Covid CT threshold.    All questions answered.  She is very appreciative of update.    Of note Helene Kelp does voice concern that her mother has suffered significant loss (including death of her husband and multiple family members) and is under lots of stress this past year.  She also states that her mother does not keep them updated on her health status, and has not been compliant (did not see need to pick it up from the pharmacy) with her Lasix.     Darel Hong, AGACNP-BC [facility] Prefer epic messenger for cross cover needs If after hours, please call E-link

## 2020-07-25 NOTE — Consult Note (Signed)
PHARMACY CONSULT NOTE  Pharmacy Consult for Electrolyte Monitoring and Replacement   Recent Labs: Potassium (mmol/L)  Date Value  07/25/2020 2.9 (L)   Magnesium (mg/dL)  Date Value  07/25/2020 1.8   Calcium (mg/dL)  Date Value  07/25/2020 8.9   Albumin (g/dL)  Date Value  05/01/2020 3.1 (L)   Phosphorus (mg/dL)  Date Value  07/25/2020 4.7 (H)   Sodium (mmol/L)  Date Value  07/25/2020 140   Assessment: Patient is a 80 y/o F with medical history including systolic HF (last EF 73-22%, EF this admission 20-25%), history of breast cancer, PAD, CAD s/p CABG in 04/2020 who is admitted with decompensated CHF / COVID-19 infection. Pharmacy has been consulted to assist with electrolyte monitoring and replacement as indicated.  Patient is currently on BiPAP. Weaning as able.  Diuretics: IV Lasix 40 mg daily  Goal of Therapy:  Electrolytes within normal limits  Plan:  --K 2.9, provider ordered IV Kcl 10 mEq x 5 --Mg 1.8, IV magnesium sulfate 2 g x 1 --Will re-check a potassium level this evening --Follow-up all electrolytes with AM labs tomorrow  [name] 07/25/2020 11:52 AM

## 2020-07-25 NOTE — Consult Note (Signed)
PHARMACY CONSULT NOTE  Pharmacy Consult for Electrolyte Monitoring and Replacement   Recent Labs: Potassium (mmol/L)  Date Value  07/25/2020 3.7   Magnesium (mg/dL)  Date Value  07/25/2020 1.8   Calcium (mg/dL)  Date Value  07/25/2020 8.9   Albumin (g/dL)  Date Value  05/01/2020 3.1 (L)   Phosphorus (mg/dL)  Date Value  07/25/2020 4.7 (H)   Sodium (mmol/L)  Date Value  07/25/2020 140   Assessment: Patient is a 80 y/o F with medical history including systolic HF (last EF 60-60%, EF this admission 20-25%), history of breast cancer, PAD, CAD s/p CABG in 04/2020 who is admitted with decompensated CHF / COVID-19 infection. Pharmacy has been consulted to assist with electrolyte monitoring and replacement as indicated.  Patient is currently on BiPAP. Weaning as able.  Diuretics: IV Lasix 40 mg daily started 7/12   Goal of Therapy:  Electrolytes within normal limits  Plan:  --K 2.9 > 3.7 s/p IV Kcl 10 mEq x 5; will give additional 20 mEq oral KCl given new scheduled IV lasix --Mg 1.8, IV magnesium sulfate 2 g x 1 --Follow-up all electrolytes with AM labs tomorrow  Dorothe Pea, PharmD, BCPS Clinical Pharmacist   07/25/2020 4:08 PM

## 2020-07-26 DIAGNOSIS — I2510 Atherosclerotic heart disease of native coronary artery without angina pectoris: Secondary | ICD-10-CM

## 2020-07-26 DIAGNOSIS — J9601 Acute respiratory failure with hypoxia: Secondary | ICD-10-CM | POA: Diagnosis not present

## 2020-07-26 LAB — COMPREHENSIVE METABOLIC PANEL
ALT: 11 U/L (ref 0–44)
AST: 20 U/L (ref 15–41)
Albumin: 3.2 g/dL — ABNORMAL LOW (ref 3.5–5.0)
Alkaline Phosphatase: 56 U/L (ref 38–126)
Anion gap: 10 (ref 5–15)
BUN: 17 mg/dL (ref 8–23)
CO2: 27 mmol/L (ref 22–32)
Calcium: 8.7 mg/dL — ABNORMAL LOW (ref 8.9–10.3)
Chloride: 100 mmol/L (ref 98–111)
Creatinine, Ser: 0.76 mg/dL (ref 0.44–1.00)
GFR, Estimated: >60 mL/min (ref >60)
Glucose, Bld: 95 mg/dL (ref 70–99)
Potassium: 3.8 mmol/L (ref 3.5–5.1)
Sodium: 137 mmol/L (ref 135–145)
Total Bilirubin: 1.3 mg/dL — ABNORMAL HIGH (ref 0.3–1.2)
Total Protein: 6.2 g/dL — ABNORMAL LOW (ref 6.5–8.1)

## 2020-07-26 LAB — CBC
HCT: 35.9 % — ABNORMAL LOW (ref 36.0–46.0)
Hemoglobin: 11.8 g/dL — ABNORMAL LOW (ref 12.0–15.0)
MCH: 31.1 pg (ref 26.0–34.0)
MCHC: 32.9 g/dL (ref 30.0–36.0)
MCV: 94.5 fL (ref 80.0–100.0)
Platelets: 172 10*3/uL (ref 150–400)
RBC: 3.8 MIL/uL — ABNORMAL LOW (ref 3.87–5.11)
RDW: 15.9 % — ABNORMAL HIGH (ref 11.5–15.5)
WBC: 5.5 10*3/uL (ref 4.0–10.5)
nRBC: 0 % (ref 0.0–0.2)

## 2020-07-26 LAB — C-REACTIVE PROTEIN: CRP: 0.8 mg/dL (ref <1.0)

## 2020-07-26 LAB — MAGNESIUM: Magnesium: 2 mg/dL (ref 1.7–2.4)

## 2020-07-26 LAB — D-DIMER, QUANTITATIVE: D-Dimer, Quant: 1.02 ug/mL-FEU — ABNORMAL HIGH (ref 0.00–0.50)

## 2020-07-26 LAB — PHOSPHORUS: Phosphorus: 4.4 mg/dL (ref 2.5–4.6)

## 2020-07-26 MED ORDER — ATORVASTATIN CALCIUM 20 MG PO TABS
20.0000 mg | ORAL_TABLET | Freq: Every day | ORAL | Status: DC
  Administered 2020-07-26 – 2020-07-28 (×3): 20 mg via ORAL
  Filled 2020-07-26 (×3): qty 1

## 2020-07-26 MED ORDER — FUROSEMIDE 10 MG/ML IJ SOLN
40.0000 mg | Freq: Two times a day (BID) | INTRAMUSCULAR | Status: DC
  Administered 2020-07-26 – 2020-07-28 (×3): 40 mg via INTRAVENOUS
  Filled 2020-07-26 (×3): qty 4

## 2020-07-26 MED ORDER — METOPROLOL SUCCINATE ER 50 MG PO TB24
25.0000 mg | ORAL_TABLET | Freq: Every day | ORAL | Status: DC
  Administered 2020-07-27 – 2020-07-28 (×2): 25 mg via ORAL
  Filled 2020-07-26 (×2): qty 1

## 2020-07-26 NOTE — Progress Notes (Signed)
Progress Note  Patient Name: [name] Date of Encounter: 07/26/2020  [facility] Cardiologist: None   Subjective   Breathing is much improved,, denies chest pain lower extremity swelling also better.  Granddaughter at bedside.    Inpatient Medications    Scheduled Meds:  albuterol  2 puff Inhalation Q4H   aspirin EC  81 mg Oral Daily   atorvastatin  20 mg Oral Daily   calcium citrate-vitamin D   Oral Daily   Chlorhexidine Gluconate Cloth  6 each Topical Q0600   clopidogrel  75 mg Oral Daily   enoxaparin (LOVENOX) injection  40 mg Subcutaneous Q24H   metoprolol succinate  12.5 mg Oral BID   pantoprazole  40 mg Oral Daily   Continuous Infusions:  PRN Meds: acetaminophen, docusate sodium, nitroGLYCERIN, polyethylene glycol   Vital Signs    Vitals:   07/26/20 0900 07/26/20 0911 07/26/20 1000 07/26/20 1100  BP:  (!) 117/49 (!) 118/54 (!) 107/50  Pulse: 97 92 89 85  Resp: (!) 23  (!) 22 13  Temp:      TempSrc:      SpO2: 99%  96% 98%  Weight:        Intake/Output Summary (Last 24 hours) at 07/26/2020 1256 Last data filed at 07/26/2020 1000 Gross per 24 hour  Intake 1239.68 ml  Output 1650 ml  Net -410.32 ml   Last 3 Weights 07/26/2020 07/25/2020 06/27/2020  Weight (lbs) 113 lb 1.5 oz 115 lb 8.3 oz 117 lb  Weight (kg) 51.3 kg 52.4 kg 53.071 kg      Telemetry    Sinus rhythm- Personally Reviewed  ECG    No new tracing reviewed- Personally Reviewed  Physical Exam   GEN: No acute distress.   Neck: No JVD Cardiac: RRR, no murmurs, rubs, or gallops.  Respiratory: Clear anteriorly, diminished breath sounds at bases GI: Soft, nontender, non-distended  MS: No edema; No deformity. Neuro:  Nonfocal  Psych: Normal affect   Labs    High Sensitivity Troponin:   Recent Labs  Lab 07/24/20 1545  TROPONINIHS 22*      Chemistry Recent Labs  Lab 07/24/20 1545 07/25/20 0411 07/25/20 1529 07/26/20 0621  NA 137 140  --  137  K 3.2* 2.9* 3.7 3.8   CL 104 105  --  100  CO2 25 27  --  27  GLUCOSE 99 96  --  95  BUN 7* 7*  --  17  CREATININE 0.64 0.49  --  0.76  CALCIUM 9.1 8.9  --  8.7*  PROT  --   --   --  6.2*  ALBUMIN  --   --   --  3.2*  AST  --   --   --  20  ALT  --   --   --  11  ALKPHOS  --   --   --  56  BILITOT  --   --   --  1.3*  GFRNONAA >60 >60  --  >60  ANIONGAP 8 8  --  10     Hematology Recent Labs  Lab 07/24/20 1545 07/25/20 0411 07/26/20 0621  WBC 4.6 4.8 5.5  RBC 4.02 4.17 3.80*  HGB 12.6 13.2 11.8*  HCT 37.7 39.3 35.9*  MCV 93.8 94.2 94.5  MCH 31.3 31.7 31.1  MCHC 33.4 33.6 32.9  RDW 15.9* 16.2* 15.9*  PLT 191 191 172    BNP Recent Labs  Lab 07/24/20 1545  BNP  745.6*     DDimer  Recent Labs  Lab 07/26/20 2706  DDIMER 1.02*     Radiology    DG Chest 2 View  Result Date: 07/24/2020 CLINICAL DATA:  Shortness of breath EXAM: CHEST - 2 VIEW COMPARISON:  Chest CT June 27, 2020 FINDINGS: Normal size heart. Nodular mediastinal contour likely reflecting adenopathy seen on prior CT. Prior median sternotomy and CABG. Similar size of the cavitary mass like lesion in the posterior left lower lobe. Increased size of the a small left pleural effusion with a new small right pleural effusion. Mild diffuse interstitial opacities. IMPRESSION: 1. Increased size of the small left pleural effusion with a new small right pleural effusion, with possible mild interstitial edema. 2. Similar size of the mass like lesion in the posterior left lower lobe. Electronically Signed   By: Dahlia Bailiff MD   On: 07/24/2020 16:54   CT Angio Chest PE W/Cm &/Or Wo Cm  Addendum Date: 07/24/2020   ADDENDUM REPORT: 07/24/2020 22:21 ADDENDUM: These results were called by telephone at the time of interpretation on 07/24/2020 at 10:21 pm to provider [facility] , who verbally acknowledged these results. Electronically Signed   By: Lovena Le M.D.   On: 07/24/2020 22:21   Result Date: 07/24/2020 CLINICAL DATA:  Shortness  of breath which began 5 days ago, nonproductive cough EXAM: CT ANGIOGRAPHY CHEST WITH CONTRAST TECHNIQUE: Multidetector CT imaging of the chest was performed using the standard protocol during bolus administration of intravenous contrast. Multiplanar CT image reconstructions and MIPs were obtained to evaluate the vascular anatomy. CONTRAST:  119mL OMNIPAQUE IOHEXOL 350 MG/ML SOLN COMPARISON:  CT 06/27/2020, mammogram 02/02/2018, 03/25/2019 FINDINGS: Cardiovascular: Satisfactory opacification the pulmonary arteries to the segmental level. No pulmonary artery filling defects are identified. Central pulmonary arteries are normal caliber. Evidence of prior sternotomy and CABG. Study is not tailored for evaluation of the vascular is a shin grafts nor the thoracic aorta. Dense calcification of the native coronary arteries. Cardiac size within normal limits. Atherosclerotic plaque within the normal caliber aorta. The left vertebral artery arises directly from the calcified aortic arch. Calcifications present in the proximal great vessels as well. Some minimal reflux of contrast into the IVC. No other major venous abnormality. Mediastinum/Nodes: Calcified mediastinal nodes are present in the subcarinal region. Borderline enlarged mediastinal and hilar nodes are nonspecific though not significantly changed from comparison prior. No other pathologically enlarged mediastinal, hilar or axillary adenopathy. No acute abnormality of the trachea or thoracic esophagus. Stable appearance of the thyroid gland without concerning dominant nodule. Lungs/Pleura: Pulmonary vascular redistribution with diffuse septal and fissural thickening. Peribronchovascular cuffing noted as well. Some developing hazy ground-glass opacities may reflect combination of edema and atelectasis. Additional more patchy opacity is seen in the periphery of the left upper lobe (4/25) increasing size of bilateral pleural effusions with adjacent passive atelectasis.  Redemonstration of a cavitary lesion seen in the posterior left lower lobe measuring 6 x 3.1 cm, previously 5.2 x 3.1 cm when measured at a similar level. This appears thick-walled with some increasing central hypoattenuating material. Upper Abdomen: No acute abnormalities present in the visualized portions of the upper abdomen. Musculoskeletal: Exaggerated thoracic kyphosis with multilevel discogenic and facet degenerative changes. Prior sternotomy and CABG. Degenerative features in the bilateral shoulders. The osseous structures appear diffusely demineralized which may limit detection of small or nondisplaced fractures. No acute osseous abnormality or suspicious osseous lesion. Stable size and appearance of a 2.3 x 1.7 cm thick-walled cystic appearing mass  in the left breast (4/41). Review of the MIP images confirms the above findings. IMPRESSION: 1. No evidence of pulmonary artery embolism. 2. Features suggesting CHF/volume overload with increasing bilateral effusions, pulmonary vascular congestion, venous reflux to the IVC, and developing pulmonary edema. 3. Slight interval increase in size of a persisting cavitary lesion in the left lung base. While this could reflect a persisting lung abscess, underlying mass lesion is not fully excluded. 4. Additional ill-defined ground-glass opacity is noted in the periphery of the left upper lobe (4/26), could reflect some acute infectious or inflammatory process versus satellite lesion. 5.  Aortic Atherosclerosis (ICD10-I70.0). 6. Native coronary artery atherosclerosis with prior sternotomy, CABG. 7. Stable appearance of a 2.3 cm thick-walled cystic appearing mass in the left breast. May reflect a region of fat necrosis or postsurgical seroma as seen comparison mammography. Electronically Signed: By: Lovena Le M.D. On: 07/24/2020 22:15   ECHOCARDIOGRAM COMPLETE  Result Date: 07/25/2020    ECHOCARDIOGRAM REPORT   Patient Name:   [name] Date of Exam:  07/25/2020 Medical Rec #:  [id number]       Height:       60.0 in Accession #:    [id number]      Weight:       115.5 lb Date of Birth:  December 01, 1940       BSA:          1.478 m Patient Age:    80 years        BP:           135/65 mmHg Patient Gender: F               HR:           91 bpm. Exam Location:  [facility] Procedure: 2D Echo, Cardiac Doppler and Color Doppler Indications:     CHF-acute diastolic Z85.88  History:         Patient has prior history of Echocardiogram examinations, most                  recent 05/03/2020. Risk Factors:Hypertension. Personal hsitory                  of radiation therapy.  Sonographer:     Sherrie Sport RDCS (AE) Referring Phys:  [id number] [place] Diagnosing Phys: Bartholome Bill MD  Sonographer Comments: Suboptimal apical window. IMPRESSIONS  1. Left ventricular ejection fraction, by estimation, is 20 to 25%. The left ventricle has severely decreased function. The left ventricle demonstrates global hypokinesis. The left ventricular internal cavity size was mildly to moderately dilated. Left ventricular diastolic parameters were normal.  2. Right ventricular systolic function is normal. The right ventricular size is mildly enlarged.  3. Left atrial size was mildly dilated.  4. Right atrial size was mildly dilated.  5. Moderate pleural effusion in the left lateral region.  6. The mitral valve is grossly normal. Moderate mitral valve regurgitation.  7. The aortic valve is grossly normal. Aortic valve regurgitation is trivial. FINDINGS  Left Ventricle: Left ventricular ejection fraction, by estimation, is 20 to 25%. The left ventricle has severely decreased function. The left ventricle demonstrates global hypokinesis. The left ventricular internal cavity size was mildly to moderately dilated. There is no left ventricular hypertrophy. Left ventricular diastolic parameters were normal. Right Ventricle: The right ventricular size is mildly enlarged. No increase in right ventricular wall  thickness. Right ventricular systolic function is normal. Left Atrium: Left atrial size was mildly dilated.  Right Atrium: Right atrial size was mildly dilated. Pericardium: There is no evidence of pericardial effusion. Mitral Valve: The mitral valve is grossly normal. Moderate mitral valve regurgitation. Tricuspid Valve: The tricuspid valve is grossly normal. Tricuspid valve regurgitation is trivial. Aortic Valve: The aortic valve is grossly normal. Aortic valve regurgitation is trivial. Aortic valve mean gradient measures 1.0 mmHg. Aortic valve peak gradient measures 2.1 mmHg. Aortic valve area, by VTI measures 2.98 cm. Pulmonic Valve: The pulmonic valve was not well visualized. Pulmonic valve regurgitation is not visualized. Aorta: The aortic root is normal in size and structure. IAS/Shunts: The atrial septum is grossly normal. Additional Comments: There is a moderate pleural effusion in the left lateral region.  LEFT VENTRICLE PLAX 2D LVIDd:         4.57 cm      Diastology LVIDs:         4.24 cm      LV e' medial:    3.15 cm/s LV PW:         1.12 cm      LV E/e' medial:  26.7 LV IVS:        1.38 cm      LV e' lateral:   7.62 cm/s LVOT diam:     2.00 cm      LV E/e' lateral: 11.0 LV SV:         35 LV SV Index:   24 LVOT Area:     3.14 cm  LV Volumes (MOD) LV vol d, MOD A2C: 82.2 ml LV vol d, MOD A4C: 123.0 ml LV vol s, MOD A2C: 71.1 ml LV vol s, MOD A4C: 81.0 ml LV SV MOD A2C:     11.1 ml LV SV MOD A4C:     123.0 ml LV SV MOD BP:      29.2 ml LEFT ATRIUM             Index       RIGHT ATRIUM           Index LA diam:        3.60 cm 2.44 cm/m  RA Area:     12.60 cm LA Vol (A2C):   90.8 ml 61.42 ml/m RA Volume:   30.10 ml  20.36 ml/m LA Vol (A4C):   46.4 ml 31.39 ml/m LA Biplane Vol: 70.3 ml 47.56 ml/m  AORTIC VALVE                   PULMONIC VALVE AV Area (Vmax):    2.04 cm    PV Vmax:        0.53 m/s AV Area (Vmean):   2.28 cm    PV Peak grad:   1.1 mmHg AV Area (VTI):     2.98 cm    RVOT Peak grad: 1  mmHg AV Vmax:           73.20 cm/s AV Vmean:          44.700 cm/s AV VTI:            0.119 m AV Peak Grad:      2.1 mmHg AV Mean Grad:      1.0 mmHg LVOT Vmax:         47.60 cm/s LVOT Vmean:        32.400 cm/s LVOT VTI:          0.113 m LVOT/AV VTI ratio: 0.95  AORTA Ao Root diam: 3.10 cm MITRAL VALVE MV Area (  PHT): 6.27 cm    SHUNTS MV Decel Time: 121 msec    Systemic VTI:  0.11 m MV E velocity: 84.00 cm/s  Systemic Diam: 2.00 cm Bartholome Bill MD Electronically signed by Bartholome Bill MD Signature Date/Time: 07/25/2020/11:02:59 AM    Final     Cardiac Studies   Echo 07/25/2020 1. Left ventricular ejection fraction, by estimation, is 20 to 25%. The  left ventricle has severely decreased function. The left ventricle  demonstrates global hypokinesis. The left ventricular internal cavity size  was mildly to moderately dilated. Left  ventricular diastolic parameters were normal.   2. Right ventricular systolic function is normal. The right ventricular  size is mildly enlarged.   3. Left atrial size was mildly dilated.   4. Right atrial size was mildly dilated.   5. Moderate pleural effusion in the left lateral region.   6. The mitral valve is grossly normal. Moderate mitral valve  regurgitation.   7. The aortic valve is grossly normal. Aortic valve regurgitation is  trivial.   Echo 04/2020 EF 40-45%  Patient Profile     80 y.o. female with history of CAD/CABG x 4 (LIMA to LAD, SVG to PDA, OM 1, SVG to OM3 in 04/2020), PAD s/p left common femoral artery stent, ischemic cardiomyopathy, EF 40% presenting with worsening shortness of breath.  Diagnosed and being seen for heart failure and worsening ejection fraction.  Echo showing EF 20 to 25%.  Assessment & Plan    Ischemic cardiomyopathy, worsening EF. -Echo yesterday with EF 20 to 25%.  Prior was 40 to 45% -Shortness of breath improved with Lasix.  Creatinine normal. -Continue Lasix IV 40 mg twice daily -Increase Toprol to 25 mg daily. -Plan  for left heart cath tomorrow morning.  Start Entresto after left heart cath, If blood pressure tolerates  2.  CAD/CABG x4 -Denies chest pain -Aspirin, Plavix, statin -Newly reduced EF, left heart cath as above.  Total encounter time more 35 minutes  Greater than 50% was spent in counseling and coordination of care with the patient     Signed, Kate Sable, MD  07/26/2020, 12:56 PM

## 2020-07-26 NOTE — Progress Notes (Signed)
PULMONARY / CRITICAL CARE MEDICINE  Name: [name] MRN: [id number] DOB: [birth year]    LOS: 2  Admitting Provider: Arlyss Gandy. T[place]ov-Yual Reason for Admission:  Acute Hypoxic respiratory failure secondary to COVID-19 infection Brief patient description: Patient presented with dyspnea; ED work-up revealed diffuse lung opacities, bilateral pleural effusions left greater than right suggestive of CHF.  Patient is admitted to the ICU for further management on BiPAP  HPI: 80 year old female with a history of coronary artery disease status postcoronary artery bypass graft x4 vessels in April 9390, systolic heart failure, hyperlipidemia, breast cancer s/p radiation and chemo, peripheral vascular disease and hypertension who presented to the ED via EMS with shortness of breath, weakness and tachycardia.  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  While in the ED, patient became more tachypneic, tachycardic and hypoxic on 4 L nasal cannula and she was transitioned to BiPAP.  She was also given Lasix.  PCCM was called to admit. Upon arrival in the ICU, patient's COVID 19 PCR test came back positive.  Patient is not vaccinated.  Admission labs show VBG with a PO2 of 62, pH of 7.33, and a PCO2 of 47.  She is mildly hypokalemic with a potassium of 3.2, proBNP of 745 and a troponin of 22.  Her CBC is unremarkable. CRP and procalcitonin are pending. Patient is awake, pleasant and offers no complaints besides dyspnea. Review of patient's chart from [place] shows that she was on HCTZ up till May 2022 but it was discontinued.  She is not currently on any diuretic.  She is on home O2 that she uses during ambulation.  Her last echocardiogram was in April 2022 and showed a left ventricular ejection fraction of 40 to 45%, left ventricular hypertrophy, and elevated left ventricular end-diastolic pressure  SIGNIFICANT  EVENTS: 07/24/20: ADMITTED, COVID-19 Positive; unvaccinated 07/25/20- patient improved , COVID appears to be inactive at this point with CT35. Patient does have a/cHFrEF, ID and Cardio evaluation in progress.  07/26/20-patient has improved, appears that COVID-19 is minimally symptomatic if anything.  She does have some SOB mostly from lung noted on CT cthest and CHF.   Past Medical History:  Diagnosis Date   Arthritis    Breast cancer ([place]) 2011   radiation- Left   Hypertension    Personal history of radiation therapy    PVD (peripheral vascular disease) ([place])    Past Surgical History:  Procedure Laterality Date   ABDOMINAL HYSTERECTOMY     BREAST BIOPSY Left 12/21/2009   positive, radiation dcis   BREAST EXCISIONAL BIOPSY Left 01/17/2010   lumpectomy DCIS   BREAST LUMPECTOMY     CATARACT EXTRACTION W/ INTRAOCULAR LENS IMPLANT & ANTERIOR VITRECTOMY, BILATERAL     CHOLECYSTECTOMY     COLONOSCOPY WITH PROPOFOL N/A 09/25/2017   Procedure: COLONOSCOPY WITH PROPOFOL;  Surgeon: Lollie Sails, MD;  Location: [facility] ENDOSCOPY;  Service: Endoscopy;  Laterality: N/A;   CORONARY ARTERY BYPASS GRAFT N/A 05/03/2020   Procedure: CORONARY ARTERY BYPASS GRAFTING (CABG) X FOUR ON PUMP USING LEFT INTERNAL MAMMARY ARTERY AND RIGHT ENDOSCOPIC SAPHEOUS VEIN HARVEST CONDUITS;  Surgeon: Melrose Nakayama, MD;  Location: [facility];  Service: Open Heart Surgery;  Laterality: N/A;   DILATION AND CURETTAGE OF UTERUS     JOINT REPLACEMENT Right 01/24/2016   KNEE ARTHROPLASTY Right 01/24/2016   Procedure: COMPUTER ASSISTED TOTAL KNEE ARTHROPLASTY;  Surgeon: Dereck Leep, MD;  Location[facility]  ORS;  Service: Orthopedics;  Laterality: Right;   LEFT HEART CATH AND CORONARY ANGIOGRAPHY N/A 05/01/2020   Procedure: LEFT HEART CATH AND CORONARY ANGIOGRAPHY;  Surgeon: Wellington Hampshire, MD;  Location: [facility] CV LAB;  Service: Cardiovascular;  Laterality: N/A;   stent in Lt leg Left    TEE WITHOUT CARDIOVERSION N/A  05/03/2020   Procedure: TRANSESOPHAGEAL ECHOCARDIOGRAM (TEE);  Surgeon: Melrose Nakayama, MD;  Location: [facility];  Service: Open Heart Surgery;  Laterality: N/A;   No current facility-administered medications on file prior to encounter.   Current Outpatient Medications on File Prior to Encounter  Medication Sig   aspirin EC 81 MG tablet Take 81 mg by mouth daily.   atorvastatin (LIPITOR) 20 MG tablet Take 1 tablet (20 mg total) by mouth at bedtime.   Calcium Carbonate-Vit D-Min (CALTRATE 600+D PLUS PO) Take 1 tablet by mouth daily.    clopidogrel (PLAVIX) 75 MG tablet Take 1 tablet (75 mg total) by mouth daily.   losartan (COZAAR) 25 MG tablet Take 1 tablet (25 mg total) by mouth daily.   metoprolol succinate (TOPROL-XL) 50 MG 24 hr tablet Take 1 tablet (50 mg total) by mouth in the morning and at bedtime. Take with or immediately following a meal.   potassium chloride SA (KLOR-CON) 20 MEQ tablet Take 20 mEq by mouth daily.   acetaminophen (TYLENOL) 500 MG tablet Take 1,000 mg by mouth every 6 (six) hours as needed for mild pain.   furosemide (LASIX) 20 MG tablet Take 1 tablet (20 mg total) by mouth daily as needed. For weight gain greater than 3 lbs overnight or 5 lbs in one week.    Allergies Allergies  Allergen Reactions   Ibuprofen Hives   Penicillins Hives    Tolerated Ancef in the past   Aleve [Naproxen Sodium] Hives   Avapro  [Irbesartan] Rash    Other reaction(s): Weal   Azithromycin Hives   Nickel Itching    Redness, itching and fluid discharge with nickel earrings   Other Other (See Comments)    Nickel"ears ran fluid and itching" redness and "sensitive" Nickel"ears ran fluid and itching"    Family History Family History  Problem Relation Age of Onset   Hypertension Mother    Arthritis Mother    Gout Mother    Lung cancer Father    Heart attack Sister    Social History  reports that she has quit smoking. She has never used smokeless tobacco. She reports that  she does not drink alcohol and does not use drugs.  Review Of Systems:  Constitutional: Positive for generalized weakness, but negative for fever and chills. Eyes: No visual changes. ENT: No sore throat. Cardiovascular: Denies chest pain but reports palpations and dyspnea Respiratory: Positive for shortness of breath. Gastrointestinal: No abdominal pain.  No nausea, no vomiting.  No diarrhea.  No constipation. Genitourinary: Negative for dysuria. Musculoskeletal: Negative for back pain. Skin: Negative for rash. Neurological: Negative for headaches, focal weakness or numbness VITAL SIGNS: BP (!) 122/58   Pulse 84   Temp (!) 96.9 F (36.1 C) (Oral)   Resp (!) 21   Wt 51.3 kg   SpO2 98%   BMI 22.09 kg/m   HEMODYNAMICS:   VENTILATOR SETTINGS:  FiO2 (%):  [40 %] 40 %BIPAP FiO2 40%, RR 12  INTAKE / OUTPUT: I/O last 3 completed shifts: In: 1442.8 [P.O.:600; IV Piggyback:842.8] Out: 2450 [Urine:2450]  PHYSICAL EXAMINATION: General:  Chronically ill-looking, normal WOB HEENT:  PERRLA, mild  JVD Neuro:  AAO X 4, moves all extremities Cardiovascular:  AP regular, S1/S2 Lungs: Diffuse rhonchi and crackles in all lung fields Abdomen:  Non-tender, non-distended and  Musculoskeletal:  +rom, no deformities Skin:  warm and dry  LABS:  BMET Recent Labs  Lab 07/24/20 1545 07/25/20 0411 07/25/20 1529 07/26/20 0621  NA 137 140  --  137  K 3.2* 2.9* 3.7 3.8  CL 104 105  --  100  CO2 25 27  --  27  BUN 7* 7*  --  17  CREATININE 0.64 0.49  --  0.76  GLUCOSE 99 96  --  95     Electrolytes Recent Labs  Lab 07/24/20 1545 07/25/20 0411 07/26/20 0621  CALCIUM 9.1 8.9 8.7*  MG  --  1.8 2.0  PHOS  --  4.7* 4.4     CBC Recent Labs  Lab 07/24/20 1545 07/25/20 0411 07/26/20 0621  WBC 4.6 4.8 5.5  HGB 12.6 13.2 11.8*  HCT 37.7 39.3 35.9*  PLT 191 191 172     Coag's No results for input(s): APTT, INR in the last 168 hours.  Sepsis Markers Recent Labs  Lab  07/25/20 0411  PROCALCITON <0.10     ABG No results for input(s): PHART, PCO2ART, PO2ART in the last 168 hours.  Liver Enzymes Recent Labs  Lab 07/26/20 0621  AST 20  ALT 11  ALKPHOS 56  BILITOT 1.3*  ALBUMIN 3.2*    Cardiac Enzymes No results for input(s): TROPONINI, PROBNP in the last 168 hours.  Glucose Recent Labs  Lab 07/25/20 0017  GLUCAP 119*     Imaging ECHOCARDIOGRAM COMPLETE  Result Date: 07/25/2020    ECHOCARDIOGRAM REPORT   Patient Name:   STEPHEN TURNBAUGH Date of Exam: 07/25/2020 Medical Rec #:  [id number]       Height:       60.0 in Accession #:    [id number]      Weight:       115.5 lb Date of Birth:  29-Aug-1940       BSA:          1.478 m Patient Age:    5 years        BP:           135/65 mmHg Patient Gender: F               HR:           91 bpm. Exam Location:  [facility] Procedure: 2D Echo, Cardiac Doppler and Color Doppler Indications:     CHF-acute diastolic G18.29  History:         Patient has prior history of Echocardiogram examinations, most                  recent 05/03/2020. Risk Factors:Hypertension. Personal hsitory                  of radiation therapy.  Sonographer:     Sherrie Sport RDCS (AE) Referring Phys:  [id number] [place] Diagnosing Phys: Bartholome Bill MD  Sonographer Comments: Suboptimal apical window. IMPRESSIONS  1. Left ventricular ejection fraction, by estimation, is 20 to 25%. The left ventricle has severely decreased function. The left ventricle demonstrates global hypokinesis. The left ventricular internal cavity size was mildly to moderately dilated. Left ventricular diastolic parameters were normal.  2. Right ventricular systolic function is normal. The right ventricular size is mildly enlarged.  3. Left atrial size was mildly dilated.  4. Right atrial size was mildly dilated.  5. Moderate pleural effusion in the left lateral region.  6. The mitral valve is grossly normal. Moderate mitral valve regurgitation.  7. The aortic valve is  grossly normal. Aortic valve regurgitation is trivial. FINDINGS  Left Ventricle: Left ventricular ejection fraction, by estimation, is 20 to 25%. The left ventricle has severely decreased function. The left ventricle demonstrates global hypokinesis. The left ventricular internal cavity size was mildly to moderately dilated. There is no left ventricular hypertrophy. Left ventricular diastolic parameters were normal. Right Ventricle: The right ventricular size is mildly enlarged. No increase in right ventricular wall thickness. Right ventricular systolic function is normal. Left Atrium: Left atrial size was mildly dilated. Right Atrium: Right atrial size was mildly dilated. Pericardium: There is no evidence of pericardial effusion. Mitral Valve: The mitral valve is grossly normal. Moderate mitral valve regurgitation. Tricuspid Valve: The tricuspid valve is grossly normal. Tricuspid valve regurgitation is trivial. Aortic Valve: The aortic valve is grossly normal. Aortic valve regurgitation is trivial. Aortic valve mean gradient measures 1.0 mmHg. Aortic valve peak gradient measures 2.1 mmHg. Aortic valve area, by VTI measures 2.98 cm. Pulmonic Valve: The pulmonic valve was not well visualized. Pulmonic valve regurgitation is not visualized. Aorta: The aortic root is normal in size and structure. IAS/Shunts: The atrial septum is grossly normal. Additional Comments: There is a moderate pleural effusion in the left lateral region.  LEFT VENTRICLE PLAX 2D LVIDd:         4.57 cm      Diastology LVIDs:         4.24 cm      LV e' medial:    3.15 cm/s LV PW:         1.12 cm      LV E/e' medial:  26.7 LV IVS:        1.38 cm      LV e' lateral:   7.62 cm/s LVOT diam:     2.00 cm      LV E/e' lateral: 11.0 LV SV:         35 LV SV Index:   24 LVOT Area:     3.14 cm  LV Volumes (MOD) LV vol d, MOD A2C: 82.2 ml LV vol d, MOD A4C: 123.0 ml LV vol s, MOD A2C: 71.1 ml LV vol s, MOD A4C: 81.0 ml LV SV MOD A2C:     11.1 ml LV SV MOD  A4C:     123.0 ml LV SV MOD BP:      29.2 ml LEFT ATRIUM             Index       RIGHT ATRIUM           Index LA diam:        3.60 cm 2.44 cm/m  RA Area:     12.60 cm LA Vol (A2C):   90.8 ml 61.42 ml/m RA Volume:   30.10 ml  20.36 ml/m LA Vol (A4C):   46.4 ml 31.39 ml/m LA Biplane Vol: 70.3 ml 47.56 ml/m  AORTIC VALVE                   PULMONIC VALVE AV Area (Vmax):    2.04 cm    PV Vmax:        0.53 m/s AV Area (Vmean):   2.28 cm    PV Peak grad:   1.1 mmHg AV Area (VTI):  2.98 cm    RVOT Peak grad: 1 mmHg AV Vmax:           73.20 cm/s AV Vmean:          44.700 cm/s AV VTI:            0.119 m AV Peak Grad:      2.1 mmHg AV Mean Grad:      1.0 mmHg LVOT Vmax:         47.60 cm/s LVOT Vmean:        32.400 cm/s LVOT VTI:          0.113 m LVOT/AV VTI ratio: 0.95  AORTA Ao Root diam: 3.10 cm MITRAL VALVE MV Area (PHT): 6.27 cm    SHUNTS MV Decel Time: 121 msec    Systemic VTI:  0.11 m MV E velocity: 84.00 cm/s  Systemic Diam: 2.00 cm Bartholome Bill MD Electronically signed by Bartholome Bill MD Signature Date/Time: 07/25/2020/11:02:59 AM    Final     STUDIES:  Echo pending  CULTURES: None  ANTIBIOTICS: NONE   LINES/TUBES: PIVs  ASSESSMENT / PLAN:  PULMONARY A: Acute hypoxic respiratory failure COVID-19 + from previous infection  She has acute on chronic HFrEF Cardio evaluation in progress - appreciate input [facility] Repeat TTE with EF 25%  CARDIOVASCULAR A:  Acute/chronic Systolic CHF exacerbation CAD s/p CABG X4 vessels Hypertension P:  Iv lasix 2-D echo Resume all home medications   RENAL A:   Hypokalemia P:   IV potassium   Best Practice: Code Status:Full code Diet: NPO while on BiPAP and heart healthy diet once off BiPAP GI prophylaxis: PPI VTE prophylaxis:  Lovenox  FAMILY  - met with daughter and reviewed plan and findings  Critical care provider statement:    Critical care time (minutes):  33   Critical care time was exclusive of:  Separately billable  procedures and  treating other patients   Critical care was necessary to treat or prevent imminent or  life-threatening deterioration of the following conditions:  Acute hypoxemic respiratory failure, Acute on chronic systolic CHF exacerbation multiple comorbid conditions   Critical care was time spent personally by me on the following  activities:  Development of treatment plan with patient or surrogate,  discussions with consultants, evaluation of patient's response to  treatment, examination of patient, obtaining history from patient or  surrogate, ordering and performing treatments and interventions, ordering  and review of laboratory studies and re-evaluation of patient's condition   I assumed direction of critical care for this patient from another  provider in my specialty: no     Ottie Glazier, M.D.  [facility] [place]     07/26/2020, 9:06 AM

## 2020-07-26 NOTE — Progress Notes (Signed)
Complete linen change done. CHG bath completed. One person assist to transfer patient from chair to bed. No acute events. Bed in lowest position, locked, call Axie Hayne in reach. Purewick appliance changed.

## 2020-07-26 NOTE — Progress Notes (Signed)
PT Cancellation Note  Patient Details Name: ALDORA PERMAN MRN: [id number] DOB: 10/26/1940   Cancelled Treatment:    Reason Eval/Treat Not Completed: Medical issues which prohibited therapy (Consult received and chart reviewed.  Per notes, patient noted with newly-reduced ejection fraction, pendin cardiac cath next date.  Per cards, hold evaluation until cath complete and patient cleared for activity.  Will continue to follow and initiate as appropriate.)   Halcyon Heck H. Owens Shark, PT, DPT, NCS 07/26/20, 3:07 PM [phone]

## 2020-07-26 NOTE — Consult Note (Signed)
PHARMACY CONSULT NOTE  Pharmacy Consult for Electrolyte Monitoring and Replacement   Recent Labs: Potassium (mmol/L)  Date Value  07/26/2020 3.8   Magnesium (mg/dL)  Date Value  07/26/2020 2.0   Calcium (mg/dL)  Date Value  07/26/2020 8.7 (L)   Albumin (g/dL)  Date Value  07/26/2020 3.2 (L)   Phosphorus (mg/dL)  Date Value  07/26/2020 4.4   Sodium (mmol/L)  Date Value  07/26/2020 137   Assessment: Patient is a 80 y/o F with medical history including systolic HF (last EF 37-90%, EF this admission 20-25%), history of breast cancer, PAD, CAD s/p CABG in 04/2020 who is admitted with decompensated CHF / COVID-19 infection. Pharmacy has been consulted to assist with electrolyte monitoring and replacement as indicated.  Patient is currently on BiPAP. Weaning as able.  Diuretics: IV Lasix 40 mg BID  Goal of Therapy:  Electrolytes within normal limits  Plan: --No electrolyte replacement warranted at this time. Developing some pre-renal azotemia with IV Lasix --Follow-up all electrolytes with AM labs tomorrow  Benita Gutter 07/26/2020 8:15 AM

## 2020-07-26 NOTE — Progress Notes (Signed)
OT Cancellation Note  Patient Details Name: [name] MRN: [id number] DOB: [birth year]   Cancelled Treatment:    Reason Eval/Treat Not Completed: Medical issues which prohibited therapy Upon chart review this date, patient noted with newly-reduced ejection fraction, pending cardiac cath next date.  Per cards, hold therapy until cath complete and patient cleared for activity. Will follow/re-initiate OT services as appropriate/able. Thank you.  Gerrianne Scale, [place], OTR/L ascom [phone] 07/26/20, 4:45 PM

## 2020-07-26 NOTE — Progress Notes (Signed)
Date of Admission:  07/24/2020   : [name] is a 80 y.o. 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   Coronary artery disease   Acute respiratory failure with hypoxia (HCC)   Acute exacerbation of CHF (congestive heart failure) (HCC)   COVID-19 virus infection    Subjective: Pt is doing well Is only needing 3 liters oxygen Sitting comfortably in chair Moved around in the room  Medications:   albuterol  2 puff Inhalation Q4H   aspirin EC  81 mg Oral Daily   atorvastatin  20 mg Oral Daily   calcium citrate-vitamin D   Oral Daily   Chlorhexidine Gluconate Cloth  6 each Topical Q0600   clopidogrel  75 mg Oral Daily   enoxaparin (LOVENOX) injection  40 mg Subcutaneous Q24H   metoprolol succinate  12.5 mg Oral BID   pantoprazole  40 mg Oral Daily    Objective: Vital signs in last 24 hours: Temp:  [95.5 F (35.3 C)-98.1 F (36.7 C)] 96.9 F (36.1 C) (07/13 0800) Pulse Rate:  [75-94] 92 (07/13 0911) Resp:  [7-25] 21 (07/13 0800) BP: (93-130)/(48-78) 117/49 (07/13 0911) SpO2:  [92 %-100 %] 98 % (07/13 0800) FiO2 (%):  [40 %] 40 % (07/12 1200) Weight:  [51.3 kg] 51.3 kg (07/13 0436)  PHYSICAL EXAM:  General: Alert, cooperative, no distress, Nasal canula Neurologic: Grossly non-focal  Lab Results Recent Labs    07/25/20 0411 07/25/20 1529 07/26/20 0621  WBC 4.8  --  5.5  HGB 13.2  --  11.8*  HCT 39.3  --  35.9*  NA 140  --  137  K 2.9* 3.7 3.8  CL 105  --  100  CO2 27  --  27  BUN 7*  --  17  CREATININE 0.49  --  0.76   Liver Panel Recent Labs    07/26/20 0621  PROT 6.2*  ALBUMIN 3.2*  AST 20  ALT 11  ALKPHOS 56  BILITOT 1.3*   Sedimentation Rate No results for input(s): ESRSEDRATE in the last 72 hours. C-Reactive Protein Recent Labs    07/25/20 0415  CRP 0.7     Microbiology:  Studies/Results: DG Chest 2 View  Result Date: 07/24/2020 CLINICAL DATA:  Shortness of breath EXAM: CHEST - 2 VIEW COMPARISON:  Chest CT June 27, 2020 FINDINGS: Normal size heart. Nodular mediastinal contour likely reflecting adenopathy seen on prior CT. Prior median sternotomy and CABG. Similar size of the cavitary mass like lesion in the posterior left lower lobe. Increased size of the a small left pleural effusion with a new small right pleural effusion. Mild diffuse interstitial opacities. IMPRESSION: 1. Increased size of the small left pleural effusion with a new small right pleural effusion, with possible mild interstitial edema. 2. Similar size of the mass like lesion in the posterior left lower lobe. Electronically Signed   By: Dahlia Bailiff MD   On: 07/24/2020 16:54   CT Angio Chest PE W/Cm &/Or Wo Cm  Addendum Date: 07/24/2020   ADDENDUM REPORT: 07/24/2020 22:21 ADDENDUM: These results were called by telephone at the time of interpretation on 07/24/2020 at 10:21 pm to provider [facility] , who verbally acknowledged these results. Electronically Signed   By: Lovena Le M.D.   On: 07/24/2020 22:21  Result Date: 07/24/2020 CLINICAL DATA:  Shortness of breath which began 5 days ago, nonproductive cough EXAM: CT ANGIOGRAPHY CHEST WITH CONTRAST TECHNIQUE: Multidetector CT imaging of the chest was performed using the standard protocol during bolus administration of intravenous contrast. Multiplanar CT image reconstructions and MIPs were obtained to evaluate the vascular anatomy. CONTRAST:  120mL OMNIPAQUE IOHEXOL 350 MG/ML SOLN COMPARISON:  CT 06/27/2020, mammogram 02/02/2018, 03/25/2019 FINDINGS: Cardiovascular: Satisfactory opacification the pulmonary arteries to the segmental level. No pulmonary artery filling defects are identified. Central pulmonary arteries are normal caliber. Evidence of prior sternotomy and CABG. Study is not tailored for evaluation of the vascular  is a shin grafts nor the thoracic aorta. Dense calcification of the native coronary arteries. Cardiac size within normal limits. Atherosclerotic plaque within the normal caliber aorta. The left vertebral artery arises directly from the calcified aortic arch. Calcifications present in the proximal great vessels as well. Some minimal reflux of contrast into the IVC. No other major venous abnormality. Mediastinum/Nodes: Calcified mediastinal nodes are present in the subcarinal region. Borderline enlarged mediastinal and hilar nodes are nonspecific though not significantly changed from comparison prior. No other pathologically enlarged mediastinal, hilar or axillary adenopathy. No acute abnormality of the trachea or thoracic esophagus. Stable appearance of the thyroid gland without concerning dominant nodule. Lungs/Pleura: Pulmonary vascular redistribution with diffuse septal and fissural thickening. Peribronchovascular cuffing noted as well. Some developing hazy ground-glass opacities may reflect combination of edema and atelectasis. Additional more patchy opacity is seen in the periphery of the left upper lobe (4/25) increasing size of bilateral pleural effusions with adjacent passive atelectasis. Redemonstration of a cavitary lesion seen in the posterior left lower lobe measuring 6 x 3.1 cm, previously 5.2 x 3.1 cm when measured at a similar level. This appears thick-walled with some increasing central hypoattenuating material. Upper Abdomen: No acute abnormalities present in the visualized portions of the upper abdomen. Musculoskeletal: Exaggerated thoracic kyphosis with multilevel discogenic and facet degenerative changes. Prior sternotomy and CABG. Degenerative features in the bilateral shoulders. The osseous structures appear diffusely demineralized which may limit detection of small or nondisplaced fractures. No acute osseous abnormality or suspicious osseous lesion. Stable size and appearance of a 2.3 x 1.7 cm  thick-walled cystic appearing mass in the left breast (4/41). Review of the MIP images confirms the above findings. IMPRESSION: 1. No evidence of pulmonary artery embolism. 2. Features suggesting CHF/volume overload with increasing bilateral effusions, pulmonary vascular congestion, venous reflux to the IVC, and developing pulmonary edema. 3. Slight interval increase in size of a persisting cavitary lesion in the left lung base. While this could reflect a persisting lung abscess, underlying mass lesion is not fully excluded. 4. Additional ill-defined ground-glass opacity is noted in the periphery of the left upper lobe (4/26), could reflect some acute infectious or inflammatory process versus satellite lesion. 5.  Aortic Atherosclerosis (ICD10-I70.0). 6. Native coronary artery atherosclerosis with prior sternotomy, CABG. 7. Stable appearance of a 2.3 cm thick-walled cystic appearing mass in the left breast. May reflect a region of fat necrosis or postsurgical seroma as seen comparison mammography. Electronically Signed: By: Lovena Le M.D. On: 07/24/2020 22:15   ECHOCARDIOGRAM COMPLETE  Result Date: 07/25/2020    ECHOCARDIOGRAM REPORT   Patient Name:   [name] Date of Exam: 07/25/2020 Medical Rec #:  [id number]       Height:       60.0 in Accession #:    [id number]      Weight:  115.5 lb Date of Birth:  1940-09-16       BSA:          1.478 m Patient Age:    19 years        BP:           135/65 mmHg Patient Gender: F               HR:           91 bpm. Exam Location:  [facility] Procedure: 2D Echo, Cardiac Doppler and Color Doppler Indications:     CHF-acute diastolic Y77.41  History:         Patient has prior history of Echocardiogram examinations, most                  recent 05/03/2020. Risk Factors:Hypertension. Personal hsitory                  of radiation therapy.  Sonographer:     Sherrie Sport RDCS (AE) Referring Phys:  [id number] [place] Diagnosing Phys: Bartholome Bill MD  Sonographer  Comments: Suboptimal apical window. IMPRESSIONS  1. Left ventricular ejection fraction, by estimation, is 20 to 25%. The left ventricle has severely decreased function. The left ventricle demonstrates global hypokinesis. The left ventricular internal cavity size was mildly to moderately dilated. Left ventricular diastolic parameters were normal.  2. Right ventricular systolic function is normal. The right ventricular size is mildly enlarged.  3. Left atrial size was mildly dilated.  4. Right atrial size was mildly dilated.  5. Moderate pleural effusion in the left lateral region.  6. The mitral valve is grossly normal. Moderate mitral valve regurgitation.  7. The aortic valve is grossly normal. Aortic valve regurgitation is trivial. FINDINGS  Left Ventricle: Left ventricular ejection fraction, by estimation, is 20 to 25%. The left ventricle has severely decreased function. The left ventricle demonstrates global hypokinesis. The left ventricular internal cavity size was mildly to moderately dilated. There is no left ventricular hypertrophy. Left ventricular diastolic parameters were normal. Right Ventricle: The right ventricular size is mildly enlarged. No increase in right ventricular wall thickness. Right ventricular systolic function is normal. Left Atrium: Left atrial size was mildly dilated. Right Atrium: Right atrial size was mildly dilated. Pericardium: There is no evidence of pericardial effusion. Mitral Valve: The mitral valve is grossly normal. Moderate mitral valve regurgitation. Tricuspid Valve: The tricuspid valve is grossly normal. Tricuspid valve regurgitation is trivial. Aortic Valve: The aortic valve is grossly normal. Aortic valve regurgitation is trivial. Aortic valve mean gradient measures 1.0 mmHg. Aortic valve peak gradient measures 2.1 mmHg. Aortic valve area, by VTI measures 2.98 cm. Pulmonic Valve: The pulmonic valve was not well visualized. Pulmonic valve regurgitation is not visualized.  Aorta: The aortic root is normal in size and structure. IAS/Shunts: The atrial septum is grossly normal. Additional Comments: There is a moderate pleural effusion in the left lateral region.  LEFT VENTRICLE PLAX 2D LVIDd:         4.57 cm      Diastology LVIDs:         4.24 cm      LV e' medial:    3.15 cm/s LV PW:         1.12 cm      LV E/e' medial:  26.7 LV IVS:        1.38 cm      LV e' lateral:   7.62 cm/s LVOT diam:  2.00 cm      LV E/e' lateral: 11.0 LV SV:         35 LV SV Index:   24 LVOT Area:     3.14 cm  LV Volumes (MOD) LV vol d, MOD A2C: 82.2 ml LV vol d, MOD A4C: 123.0 ml LV vol s, MOD A2C: 71.1 ml LV vol s, MOD A4C: 81.0 ml LV SV MOD A2C:     11.1 ml LV SV MOD A4C:     123.0 ml LV SV MOD BP:      29.2 ml LEFT ATRIUM             Index       RIGHT ATRIUM           Index LA diam:        3.60 cm 2.44 cm/m  RA Area:     12.60 cm LA Vol (A2C):   90.8 ml 61.42 ml/m RA Volume:   30.10 ml  20.36 ml/m LA Vol (A4C):   46.4 ml 31.39 ml/m LA Biplane Vol: 70.3 ml 47.56 ml/m  AORTIC VALVE                   PULMONIC VALVE AV Area (Vmax):    2.04 cm    PV Vmax:        0.53 m/s AV Area (Vmean):   2.28 cm    PV Peak grad:   1.1 mmHg AV Area (VTI):     2.98 cm    RVOT Peak grad: 1 mmHg AV Vmax:           73.20 cm/s AV Vmean:          44.700 cm/s AV VTI:            0.119 m AV Peak Grad:      2.1 mmHg AV Mean Grad:      1.0 mmHg LVOT Vmax:         47.60 cm/s LVOT Vmean:        32.400 cm/s LVOT VTI:          0.113 m LVOT/AV VTI ratio: 0.95  AORTA Ao Root diam: 3.10 cm MITRAL VALVE MV Area (PHT): 6.27 cm    SHUNTS MV Decel Time: 121 msec    Systemic VTI:  0.11 m MV E velocity: 84.00 cm/s  Systemic Diam: 2.00 cm Bartholome Bill MD Electronically signed by Bartholome Bill MD Signature Date/Time: 07/25/2020/11:02:59 AM    Final      Assessment/Plan: 80 year old female admitted with shortness of breath  Acute hypoxic respiratory failure due to CHF.  She has ischemic cardiomyopathy.  She is improved with Lasix 40 mg  every 12. There is also a left lung mass which is cavitating and thick-walled.  This has been present since April 2022 and is increased in size when the x-rays taken from April is compared to the one taken on 07/24/2020. Even though she was diagnosed with COVID incidentally has not contributing to this current illness or shortness of breath. Also remdesivir and Decadron was stopped yesterday.  CAD status post recent CABG in April 2022.  Patient is currently on Plavix statin and beta-blocker.   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  CT scan shows cystic lesion in the left breast.  This could be an old seroma.  But malignancy needs to be ruled out.  Left lung thick-walled cavitating mass.  Need to rule out malignancy.  Lung primary versus mets from the breast cancer.  Daily is infection. She needs a bronchoscopy versus transthoracic biopsy.  Continue airborne for 10 days ID will sign off call if needed.  Discussed the management with her nurse

## 2020-07-26 NOTE — H&P (View-Only) (Signed)
Progress Note  Patient Name: [name] Date of Encounter: 07/26/2020  [facility] Cardiologist: None   Subjective   Breathing is much improved,, denies chest pain lower extremity swelling also better.  Granddaughter at bedside.    Inpatient Medications    Scheduled Meds:  albuterol  2 puff Inhalation Q4H   aspirin EC  81 mg Oral Daily   atorvastatin  20 mg Oral Daily   calcium citrate-vitamin D   Oral Daily   Chlorhexidine Gluconate Cloth  6 each Topical Q0600   clopidogrel  75 mg Oral Daily   enoxaparin (LOVENOX) injection  40 mg Subcutaneous Q24H   metoprolol succinate  12.5 mg Oral BID   pantoprazole  40 mg Oral Daily   Continuous Infusions:  PRN Meds: acetaminophen, docusate sodium, nitroGLYCERIN, polyethylene glycol   Vital Signs    Vitals:   07/26/20 0900 07/26/20 0911 07/26/20 1000 07/26/20 1100  BP:  (!) 117/49 (!) 118/54 (!) 107/50  Pulse: 97 92 89 85  Resp: (!) 23  (!) 22 13  Temp:      TempSrc:      SpO2: 99%  96% 98%  Weight:        Intake/Output Summary (Last 24 hours) at 07/26/2020 1256 Last data filed at 07/26/2020 1000 Gross per 24 hour  Intake 1239.68 ml  Output 1650 ml  Net -410.32 ml   Last 3 Weights 07/26/2020 07/25/2020 06/27/2020  Weight (lbs) 113 lb 1.5 oz 115 lb 8.3 oz 117 lb  Weight (kg) 51.3 kg 52.4 kg 53.071 kg      Telemetry    Sinus rhythm- Personally Reviewed  ECG    No new tracing reviewed- Personally Reviewed  Physical Exam   GEN: No acute distress.   Neck: No JVD Cardiac: RRR, no murmurs, rubs, or gallops.  Respiratory: Clear anteriorly, diminished breath sounds at bases GI: Soft, nontender, non-distended  MS: No edema; No deformity. Neuro:  Nonfocal  Psych: Normal affect   Labs    High Sensitivity Troponin:   Recent Labs  Lab 07/24/20 1545  TROPONINIHS 22*      Chemistry Recent Labs  Lab 07/24/20 1545 07/25/20 0411 07/25/20 1529 07/26/20 0621  NA 137 140  --  137  K 3.2* 2.9* 3.7 3.8   CL 104 105  --  100  CO2 25 27  --  27  GLUCOSE 99 96  --  95  BUN 7* 7*  --  17  CREATININE 0.64 0.49  --  0.76  CALCIUM 9.1 8.9  --  8.7*  PROT  --   --   --  6.2*  ALBUMIN  --   --   --  3.2*  AST  --   --   --  20  ALT  --   --   --  11  ALKPHOS  --   --   --  56  BILITOT  --   --   --  1.3*  GFRNONAA >60 >60  --  >60  ANIONGAP 8 8  --  10     Hematology Recent Labs  Lab 07/24/20 1545 07/25/20 0411 07/26/20 0621  WBC 4.6 4.8 5.5  RBC 4.02 4.17 3.80*  HGB 12.6 13.2 11.8*  HCT 37.7 39.3 35.9*  MCV 93.8 94.2 94.5  MCH 31.3 31.7 31.1  MCHC 33.4 33.6 32.9  RDW 15.9* 16.2* 15.9*  PLT 191 191 172    BNP Recent Labs  Lab 07/24/20 1545  BNP  745.6*     DDimer  Recent Labs  Lab 07/26/20 6734  DDIMER 1.02*     Radiology    DG Chest 2 View  Result Date: 07/24/2020 CLINICAL DATA:  Shortness of breath EXAM: CHEST - 2 VIEW COMPARISON:  Chest CT June 27, 2020 FINDINGS: Normal size heart. Nodular mediastinal contour likely reflecting adenopathy seen on prior CT. Prior median sternotomy and CABG. Similar size of the cavitary mass like lesion in the posterior left lower lobe. Increased size of the a small left pleural effusion with a new small right pleural effusion. Mild diffuse interstitial opacities. IMPRESSION: 1. Increased size of the small left pleural effusion with a new small right pleural effusion, with possible mild interstitial edema. 2. Similar size of the mass like lesion in the posterior left lower lobe. Electronically Signed   By: Dahlia Bailiff MD   On: 07/24/2020 16:54   CT Angio Chest PE W/Cm &/Or Wo Cm  Addendum Date: 07/24/2020   ADDENDUM REPORT: 07/24/2020 22:21 ADDENDUM: These results were called by telephone at the time of interpretation on 07/24/2020 at 10:21 pm to provider [facility] , who verbally acknowledged these results. Electronically Signed   By: Lovena Le M.D.   On: 07/24/2020 22:21   Result Date: 07/24/2020 CLINICAL DATA:  Shortness  of breath which began 5 days ago, nonproductive cough EXAM: CT ANGIOGRAPHY CHEST WITH CONTRAST TECHNIQUE: Multidetector CT imaging of the chest was performed using the standard protocol during bolus administration of intravenous contrast. Multiplanar CT image reconstructions and MIPs were obtained to evaluate the vascular anatomy. CONTRAST:  143mL OMNIPAQUE IOHEXOL 350 MG/ML SOLN COMPARISON:  CT 06/27/2020, mammogram 02/02/2018, 03/25/2019 FINDINGS: Cardiovascular: Satisfactory opacification the pulmonary arteries to the segmental level. No pulmonary artery filling defects are identified. Central pulmonary arteries are normal caliber. Evidence of prior sternotomy and CABG. Study is not tailored for evaluation of the vascular is a shin grafts nor the thoracic aorta. Dense calcification of the native coronary arteries. Cardiac size within normal limits. Atherosclerotic plaque within the normal caliber aorta. The left vertebral artery arises directly from the calcified aortic arch. Calcifications present in the proximal great vessels as well. Some minimal reflux of contrast into the IVC. No other major venous abnormality. Mediastinum/Nodes: Calcified mediastinal nodes are present in the subcarinal region. Borderline enlarged mediastinal and hilar nodes are nonspecific though not significantly changed from comparison prior. No other pathologically enlarged mediastinal, hilar or axillary adenopathy. No acute abnormality of the trachea or thoracic esophagus. Stable appearance of the thyroid gland without concerning dominant nodule. Lungs/Pleura: Pulmonary vascular redistribution with diffuse septal and fissural thickening. Peribronchovascular cuffing noted as well. Some developing hazy ground-glass opacities may reflect combination of edema and atelectasis. Additional more patchy opacity is seen in the periphery of the left upper lobe (4/25) increasing size of bilateral pleural effusions with adjacent passive atelectasis.  Redemonstration of a cavitary lesion seen in the posterior left lower lobe measuring 6 x 3.1 cm, previously 5.2 x 3.1 cm when measured at a similar level. This appears thick-walled with some increasing central hypoattenuating material. Upper Abdomen: No acute abnormalities present in the visualized portions of the upper abdomen. Musculoskeletal: Exaggerated thoracic kyphosis with multilevel discogenic and facet degenerative changes. Prior sternotomy and CABG. Degenerative features in the bilateral shoulders. The osseous structures appear diffusely demineralized which may limit detection of small or nondisplaced fractures. No acute osseous abnormality or suspicious osseous lesion. Stable size and appearance of a 2.3 x 1.7 cm thick-walled cystic appearing mass  in the left breast (4/41). Review of the MIP images confirms the above findings. IMPRESSION: 1. No evidence of pulmonary artery embolism. 2. Features suggesting CHF/volume overload with increasing bilateral effusions, pulmonary vascular congestion, venous reflux to the IVC, and developing pulmonary edema. 3. Slight interval increase in size of a persisting cavitary lesion in the left lung base. While this could reflect a persisting lung abscess, underlying mass lesion is not fully excluded. 4. Additional ill-defined ground-glass opacity is noted in the periphery of the left upper lobe (4/26), could reflect some acute infectious or inflammatory process versus satellite lesion. 5.  Aortic Atherosclerosis (ICD10-I70.0). 6. Native coronary artery atherosclerosis with prior sternotomy, CABG. 7. Stable appearance of a 2.3 cm thick-walled cystic appearing mass in the left breast. May reflect a region of fat necrosis or postsurgical seroma as seen comparison mammography. Electronically Signed: By: Lovena Le M.D. On: 07/24/2020 22:15   ECHOCARDIOGRAM COMPLETE  Result Date: 07/25/2020    ECHOCARDIOGRAM REPORT   Patient Name:   [name] Date of Exam:  07/25/2020 Medical Rec #:  [id number]       Height:       60.0 in Accession #:    [id number]      Weight:       115.5 lb Date of Birth:  08-06-1940       BSA:          1.478 m Patient Age:    80 years        BP:           135/65 mmHg Patient Gender: F               HR:           91 bpm. Exam Location:  [facility] Procedure: 2D Echo, Cardiac Doppler and Color Doppler Indications:     CHF-acute diastolic Q67.61  History:         Patient has prior history of Echocardiogram examinations, most                  recent 05/03/2020. Risk Factors:Hypertension. Personal hsitory                  of radiation therapy.  Sonographer:     Sherrie Sport RDCS (AE) Referring Phys:  [id number] [place] Diagnosing Phys: Bartholome Bill MD  Sonographer Comments: Suboptimal apical window. IMPRESSIONS  1. Left ventricular ejection fraction, by estimation, is 20 to 25%. The left ventricle has severely decreased function. The left ventricle demonstrates global hypokinesis. The left ventricular internal cavity size was mildly to moderately dilated. Left ventricular diastolic parameters were normal.  2. Right ventricular systolic function is normal. The right ventricular size is mildly enlarged.  3. Left atrial size was mildly dilated.  4. Right atrial size was mildly dilated.  5. Moderate pleural effusion in the left lateral region.  6. The mitral valve is grossly normal. Moderate mitral valve regurgitation.  7. The aortic valve is grossly normal. Aortic valve regurgitation is trivial. FINDINGS  Left Ventricle: Left ventricular ejection fraction, by estimation, is 20 to 25%. The left ventricle has severely decreased function. The left ventricle demonstrates global hypokinesis. The left ventricular internal cavity size was mildly to moderately dilated. There is no left ventricular hypertrophy. Left ventricular diastolic parameters were normal. Right Ventricle: The right ventricular size is mildly enlarged. No increase in right ventricular wall  thickness. Right ventricular systolic function is normal. Left Atrium: Left atrial size was mildly dilated.  Right Atrium: Right atrial size was mildly dilated. Pericardium: There is no evidence of pericardial effusion. Mitral Valve: The mitral valve is grossly normal. Moderate mitral valve regurgitation. Tricuspid Valve: The tricuspid valve is grossly normal. Tricuspid valve regurgitation is trivial. Aortic Valve: The aortic valve is grossly normal. Aortic valve regurgitation is trivial. Aortic valve mean gradient measures 1.0 mmHg. Aortic valve peak gradient measures 2.1 mmHg. Aortic valve area, by VTI measures 2.98 cm. Pulmonic Valve: The pulmonic valve was not well visualized. Pulmonic valve regurgitation is not visualized. Aorta: The aortic root is normal in size and structure. IAS/Shunts: The atrial septum is grossly normal. Additional Comments: There is a moderate pleural effusion in the left lateral region.  LEFT VENTRICLE PLAX 2D LVIDd:         4.57 cm      Diastology LVIDs:         4.24 cm      LV e' medial:    3.15 cm/s LV PW:         1.12 cm      LV E/e' medial:  26.7 LV IVS:        1.38 cm      LV e' lateral:   7.62 cm/s LVOT diam:     2.00 cm      LV E/e' lateral: 11.0 LV SV:         35 LV SV Index:   24 LVOT Area:     3.14 cm  LV Volumes (MOD) LV vol d, MOD A2C: 82.2 ml LV vol d, MOD A4C: 123.0 ml LV vol s, MOD A2C: 71.1 ml LV vol s, MOD A4C: 81.0 ml LV SV MOD A2C:     11.1 ml LV SV MOD A4C:     123.0 ml LV SV MOD BP:      29.2 ml LEFT ATRIUM             Index       RIGHT ATRIUM           Index LA diam:        3.60 cm 2.44 cm/m  RA Area:     12.60 cm LA Vol (A2C):   90.8 ml 61.42 ml/m RA Volume:   30.10 ml  20.36 ml/m LA Vol (A4C):   46.4 ml 31.39 ml/m LA Biplane Vol: 70.3 ml 47.56 ml/m  AORTIC VALVE                   PULMONIC VALVE AV Area (Vmax):    2.04 cm    PV Vmax:        0.53 m/s AV Area (Vmean):   2.28 cm    PV Peak grad:   1.1 mmHg AV Area (VTI):     2.98 cm    RVOT Peak grad: 1  mmHg AV Vmax:           73.20 cm/s AV Vmean:          44.700 cm/s AV VTI:            0.119 m AV Peak Grad:      2.1 mmHg AV Mean Grad:      1.0 mmHg LVOT Vmax:         47.60 cm/s LVOT Vmean:        32.400 cm/s LVOT VTI:          0.113 m LVOT/AV VTI ratio: 0.95  AORTA Ao Root diam: 3.10 cm MITRAL VALVE MV Area (  PHT): 6.27 cm    SHUNTS MV Decel Time: 121 msec    Systemic VTI:  0.11 m MV E velocity: 84.00 cm/s  Systemic Diam: 2.00 cm Bartholome Bill MD Electronically signed by Bartholome Bill MD Signature Date/Time: 07/25/2020/11:02:59 AM    Final     Cardiac Studies   Echo 07/25/2020 1. Left ventricular ejection fraction, by estimation, is 20 to 25%. The  left ventricle has severely decreased function. The left ventricle  demonstrates global hypokinesis. The left ventricular internal cavity size  was mildly to moderately dilated. Left  ventricular diastolic parameters were normal.   2. Right ventricular systolic function is normal. The right ventricular  size is mildly enlarged.   3. Left atrial size was mildly dilated.   4. Right atrial size was mildly dilated.   5. Moderate pleural effusion in the left lateral region.   6. The mitral valve is grossly normal. Moderate mitral valve  regurgitation.   7. The aortic valve is grossly normal. Aortic valve regurgitation is  trivial.   Echo 04/2020 EF 40-45%  Patient Profile     80 y.o. female with history of CAD/CABG x 4 (LIMA to LAD, SVG to PDA, OM 1, SVG to OM3 in 04/2020), PAD s/p left common femoral artery stent, ischemic cardiomyopathy, EF 40% presenting with worsening shortness of breath.  Diagnosed and being seen for heart failure and worsening ejection fraction.  Echo showing EF 20 to 25%.  Assessment & Plan    Ischemic cardiomyopathy, worsening EF. -Echo yesterday with EF 20 to 25%.  Prior was 40 to 45% -Shortness of breath improved with Lasix.  Creatinine normal. -Continue Lasix IV 40 mg twice daily -Increase Toprol to 25 mg daily. -Plan  for left heart cath tomorrow morning.  Start Entresto after left heart cath, If blood pressure tolerates  2.  CAD/CABG x4 -Denies chest pain -Aspirin, Plavix, statin -Newly reduced EF, left heart cath as above.  Total encounter time more 35 minutes  Greater than 50% was spent in counseling and coordination of care with the patient     Signed, Kate Sable, MD  07/26/2020, 12:56 PM

## 2020-07-26 NOTE — Progress Notes (Signed)
Patient O2 weaned to 6L high flow. O2 sats 97%. Will continue to monitor.

## 2020-07-27 ENCOUNTER — Encounter: Payer: Self-pay | Admitting: Pulmonary Disease

## 2020-07-27 ENCOUNTER — Encounter
Admission: EM | Disposition: A | Discharge status: Home Health | Payer: Self-pay | Source: Home / Self Care | Attending: Pulmonary Disease

## 2020-07-27 DIAGNOSIS — I200 Unstable angina: Secondary | ICD-10-CM

## 2020-07-27 DIAGNOSIS — I25110 Atherosclerotic heart disease of native coronary artery with unstable angina pectoris: Secondary | ICD-10-CM

## 2020-07-27 HISTORY — PX: RIGHT/LEFT HEART CATH AND CORONARY/GRAFT ANGIOGRAPHY: CATH118267

## 2020-07-27 LAB — CBC
HCT: 35.1 % — ABNORMAL LOW (ref 36.0–46.0)
Hemoglobin: 11.6 g/dL — ABNORMAL LOW (ref 12.0–15.0)
MCH: 32 pg (ref 26.0–34.0)
MCHC: 33 g/dL (ref 30.0–36.0)
MCV: 97 fL (ref 80.0–100.0)
Platelets: 157 10*3/uL (ref 150–400)
RBC: 3.62 MIL/uL — ABNORMAL LOW (ref 3.87–5.11)
RDW: 16.1 % — ABNORMAL HIGH (ref 11.5–15.5)
WBC: 4.3 10*3/uL (ref 4.0–10.5)
nRBC: 0 % (ref 0.0–0.2)

## 2020-07-27 LAB — D-DIMER, QUANTITATIVE: D-Dimer, Quant: 1.06 ug/mL-FEU — ABNORMAL HIGH (ref 0.00–0.50)

## 2020-07-27 LAB — BASIC METABOLIC PANEL
Anion gap: 7 (ref 5–15)
BUN: 17 mg/dL (ref 8–23)
CO2: 30 mmol/L (ref 22–32)
Calcium: 9.2 mg/dL (ref 8.9–10.3)
Chloride: 102 mmol/L (ref 98–111)
Creatinine, Ser: 0.79 mg/dL (ref 0.44–1.00)
GFR, Estimated: >60 mL/min (ref >60)
Glucose, Bld: 106 mg/dL — ABNORMAL HIGH (ref 70–99)
Potassium: 4.1 mmol/L (ref 3.5–5.1)
Sodium: 139 mmol/L (ref 135–145)

## 2020-07-27 LAB — COMPREHENSIVE METABOLIC PANEL
ALT: 11 U/L (ref 0–44)
AST: 20 U/L (ref 15–41)
Albumin: 3.2 g/dL — ABNORMAL LOW (ref 3.5–5.0)
Alkaline Phosphatase: 63 U/L (ref 38–126)
Anion gap: 11 (ref 5–15)
BUN: 21 mg/dL (ref 8–23)
CO2: 28 mmol/L (ref 22–32)
Calcium: 8.7 mg/dL — ABNORMAL LOW (ref 8.9–10.3)
Chloride: 100 mmol/L (ref 98–111)
Creatinine, Ser: 0.68 mg/dL (ref 0.44–1.00)
GFR, Estimated: >60 mL/min (ref >60)
Glucose, Bld: 103 mg/dL — ABNORMAL HIGH (ref 70–99)
Potassium: 3.2 mmol/L — ABNORMAL LOW (ref 3.5–5.1)
Sodium: 139 mmol/L (ref 135–145)
Total Bilirubin: 1 mg/dL (ref 0.3–1.2)
Total Protein: 6.6 g/dL (ref 6.5–8.1)

## 2020-07-27 LAB — C-REACTIVE PROTEIN: CRP: 0.9 mg/dL (ref <1.0)

## 2020-07-27 SURGERY — RIGHT/LEFT HEART CATH AND CORONARY/GRAFT ANGIOGRAPHY
Anesthesia: Moderate Sedation

## 2020-07-27 MED ORDER — POTASSIUM CHLORIDE CRYS ER 20 MEQ PO TBCR
40.0000 meq | EXTENDED_RELEASE_TABLET | ORAL | Status: AC
  Administered 2020-07-27 (×2): 40 meq via ORAL
  Filled 2020-07-27 (×2): qty 2

## 2020-07-27 MED ORDER — SODIUM CHLORIDE 0.9% FLUSH: 3.0000 mL | INTRAVENOUS | Status: DC | PRN

## 2020-07-27 MED ORDER — SODIUM CHLORIDE 0.9 % IV SOLN: 250.0000 mL | INTRAVENOUS | Status: DC | PRN

## 2020-07-27 MED ORDER — IOHEXOL 300 MG/ML  SOLN
INTRAMUSCULAR | Status: DC | PRN
Start: 2020-07-27 — End: 2020-07-27
  Administered 2020-07-27: 90 mL

## 2020-07-27 MED ORDER — FENTANYL CITRATE (PF) 100 MCG/2ML IJ SOLN
INTRAMUSCULAR | Status: AC
  Filled 2020-07-27: qty 2

## 2020-07-27 MED ORDER — ONDANSETRON HCL 4 MG/2ML IJ SOLN: 4.0000 mg | Freq: Four times a day (QID) | INTRAMUSCULAR | Status: DC | PRN

## 2020-07-27 MED ORDER — LIDOCAINE HCL (PF) 1 % IJ SOLN
INTRAMUSCULAR | Status: DC | PRN
  Administered 2020-07-27: 10 mL

## 2020-07-27 MED ORDER — ENSURE ENLIVE PO LIQD
237.0000 mL | Freq: Three times a day (TID) | ORAL | Status: DC
  Administered 2020-07-27 – 2020-07-28 (×2): 237 mL via ORAL

## 2020-07-27 MED ORDER — SODIUM CHLORIDE 0.9% FLUSH
3.0000 mL | Freq: Two times a day (BID) | INTRAVENOUS | Status: DC
  Administered 2020-07-27 – 2020-07-28 (×2): 3 mL via INTRAVENOUS

## 2020-07-27 MED ORDER — MIDAZOLAM HCL 2 MG/2ML IJ SOLN
INTRAMUSCULAR | Status: AC
  Filled 2020-07-27: qty 2

## 2020-07-27 MED ORDER — HEPARIN (PORCINE) IN NACL 2000-0.9 UNIT/L-% IV SOLN
INTRAVENOUS | Status: DC | PRN
  Administered 2020-07-27: 1000 mL

## 2020-07-27 MED ORDER — FENTANYL CITRATE (PF) 100 MCG/2ML IJ SOLN
INTRAMUSCULAR | Status: DC | PRN
  Administered 2020-07-27: 25 ug via INTRAVENOUS

## 2020-07-27 MED ORDER — MIDAZOLAM HCL 2 MG/2ML IJ SOLN
INTRAMUSCULAR | Status: DC | PRN
  Administered 2020-07-27: 1 mg via INTRAVENOUS

## 2020-07-27 MED ORDER — ASPIRIN 81 MG PO CHEW: 81.0000 mg | CHEWABLE_TABLET | ORAL | Status: DC

## 2020-07-27 MED ORDER — SODIUM CHLORIDE 0.9 % IV SOLN: INTRAVENOUS | Status: DC

## 2020-07-27 MED ORDER — LIDOCAINE HCL (PF) 1 % IJ SOLN
INTRAMUSCULAR | Status: AC
  Filled 2020-07-27: qty 30

## 2020-07-27 MED ORDER — SODIUM CHLORIDE 0.9% FLUSH: 3.0000 mL | Freq: Two times a day (BID) | INTRAVENOUS | Status: DC

## 2020-07-27 MED ORDER — HEPARIN (PORCINE) IN NACL 1000-0.9 UT/500ML-% IV SOLN
INTRAVENOUS | Status: AC
  Filled 2020-07-27: qty 1000

## 2020-07-27 SURGICAL SUPPLY — 17 items
CANNULA 5F STIFF (CANNULA) ×1 IMPLANT
CATH INFINITI 5FR JL4 (CATHETERS) ×1 IMPLANT
CATH INFINITI JR4 5F (CATHETERS) ×1 IMPLANT
CATH SWAN GANZ 7F STRAIGHT (CATHETERS) ×1 IMPLANT
DEVICE CLOSURE MYNXGRIP 5F (Vascular Products) IMPLANT
DEVICE CLOSURE MYNXGRIP 6/7F (Vascular Products) IMPLANT
DRAPE BRACHIAL (DRAPES) ×2 IMPLANT
GUIDEWIRE EMER 3M J .025X150CM (WIRE) ×1 IMPLANT
KIT SYRINGE INJ CVI SPIKEX1 (MISCELLANEOUS) ×1 IMPLANT
PACK CARDIAC CATH (CUSTOM PROCEDURE TRAY) ×2 IMPLANT
PROTECTION STATION PRESSURIZED (MISCELLANEOUS) ×2
SET ATX SIMPLICITY (MISCELLANEOUS) ×1 IMPLANT
SHEATH AVANTI 5FR X 11CM (SHEATH) ×1 IMPLANT
SHEATH AVANTI 7FRX11 (SHEATH) ×1 IMPLANT
STATION PROTECTION PRESSURIZED (MISCELLANEOUS) IMPLANT
WIRE GUIDERIGHT .035X150 (WIRE) ×1 IMPLANT
WIRE NITINOL .018 (WIRE) ×1 IMPLANT

## 2020-07-27 NOTE — Care Management (Signed)
ICU acceptance note  80 year old female history of coronary artery disease status post four-vessel CABG in April 8341, systolic heart failure, hyperlipidemia, breast cancer who presented to the ED via EMS for shortness of breath, weakness, tachycardia.  Upon arrival in ED patient's work of breathing increased and she was transitioned to BiPAP and given Lasix.  PCCM was called to admit.  Cardiology involved and patient underwent diagnostic catheterization.  Multivessel disease noted.  No PCI performed.  Per cardiology recommendations continue intravenous diuretics and optimize heart failure medications.  Patient stable from an ICU standpoint will be transferred out.  [facility] to assume primary care of this patient on 7/15  Ralene Muskrat MD

## 2020-07-27 NOTE — Progress Notes (Signed)
Patient O2 sats 88-89% on Room air. 2L [place] applied, O2 saturation 94%. Will continue to monitor.

## 2020-07-27 NOTE — CV Procedure (Signed)
Right and left cardiac catheterization with graft angiography was performed via the right femoral artery and vein.  It showed severe underlying three-vessel coronary artery disease with patent grafts including LIMA to LAD, SVG to OM1 and SVG to right PDA/OM 3. Right heart catheterization showed moderately elevated filling pressures.  Recommendations: No revascularization is needed. Continue intravenous diuresis and optimize heart failure medications.  Full report to follow.

## 2020-07-27 NOTE — Progress Notes (Signed)
PULMONARY / CRITICAL CARE MEDICINE  Name: [name] MRN: [id number] DOB: 1940/02/12    LOS: 3  Admitting Provider: Arlyss Gandy. Tukov-Yual Reason for Admission:  Acute Hypoxic respiratory failure secondary to COVID-19 infection Brief patient description: Patient presented with dyspnea; ED work-up revealed diffuse lung opacities, bilateral pleural effusions left greater than right suggestive of CHF.  Patient is admitted to the ICU for further management on BiPAP  HPI: 80 year old female with a history of coronary artery disease status postcoronary artery bypass graft x4 vessels in April 3009, systolic heart failure, hyperlipidemia, breast cancer s/p radiation and chemo, peripheral vascular disease and hypertension who presented to the ED via EMS with shortness of breath, weakness and tachycardia.  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  While in the ED, patient became more tachypneic, tachycardic and hypoxic on 4 L nasal cannula and she was transitioned to BiPAP.  She was also given Lasix.  PCCM was called to admit. Upon arrival in the ICU, patient's COVID 19 PCR test came back positive.  Patient is not vaccinated.  Admission labs show VBG with a PO2 of 62, pH of 7.33, and a PCO2 of 47.  She is mildly hypokalemic with a potassium of 3.2, proBNP of 745 and a troponin of 22.  Her CBC is unremarkable. CRP and procalcitonin are pending. Patient is awake, pleasant and offers no complaints besides dyspnea. Review of patient's chart from [place] shows that she was on HCTZ up till May 2022 but it was discontinued.  She is not currently on any diuretic.  She is on home O2 that she uses during ambulation.  Her last echocardiogram was in April 2022 and showed a left ventricular ejection fraction of 40 to 45%, left ventricular hypertrophy, and elevated left ventricular end-diastolic pressure  SIGNIFICANT  EVENTS: 07/24/20: ADMITTED, COVID-19 Positive; unvaccinated 07/25/20- patient improved , COVID appears to be inactive at this point with CT35. Patient does have a/cHFrEF, ID and Cardio evaluation in progress.  07/26/20-patient has improved, appears that COVID-19 is minimally symptomatic if anything.  She does have some SOB mostly from lung noted on CT cthest and CHF.  07/27/20 - optimized for TRH transfer   Past Medical History:  Diagnosis Date   Arthritis    Breast cancer ([place]) 2011   radiation- Left   Hypertension    Personal history of radiation therapy    PVD (peripheral vascular disease) ([place])    Past Surgical History:  Procedure Laterality Date   ABDOMINAL HYSTERECTOMY     BREAST BIOPSY Left 12/21/2009   positive, radiation dcis   BREAST EXCISIONAL BIOPSY Left 01/17/2010   lumpectomy DCIS   BREAST LUMPECTOMY     CATARACT EXTRACTION W/ INTRAOCULAR LENS IMPLANT & ANTERIOR VITRECTOMY, BILATERAL     CHOLECYSTECTOMY     COLONOSCOPY WITH PROPOFOL N/A 09/25/2017   Procedure: COLONOSCOPY WITH PROPOFOL;  Surgeon: Lollie Sails, MD;  Location[facility] ENDOSCOPY;  Service: Endoscopy;  Laterality: N/A;   CORONARY ARTERY BYPASS GRAFT N/A 05/03/2020   Procedure: CORONARY ARTERY BYPASS GRAFTING (CABG) X FOUR ON PUMP USING LEFT INTERNAL MAMMARY ARTERY AND RIGHT ENDOSCOPIC SAPHEOUS VEIN HARVEST CONDUITS;  Surgeon: Melrose Nakayama, MD;  Location: [place];  Service: Open Heart Surgery;  Laterality: N/A;   DILATION AND CURETTAGE OF UTERUS     JOINT REPLACEMENT Right 01/24/2016   KNEE ARTHROPLASTY Right 01/24/2016   Procedure: COMPUTER ASSISTED TOTAL KNEE ARTHROPLASTY;  Surgeon:  Dereck Leep, MD;  Location: [facility] ORS;  Service: Orthopedics;  Laterality: Right;   LEFT HEART CATH AND CORONARY ANGIOGRAPHY N/A 05/01/2020   Procedure: LEFT HEART CATH AND CORONARY ANGIOGRAPHY;  Surgeon: Wellington Hampshire, MD;  Location: [place] [facility];  Service: Cardiovascular;  Laterality: N/A;   stent in Lt leg  Left    TEE WITHOUT CARDIOVERSION N/A 05/03/2020   Procedure: TRANSESOPHAGEAL ECHOCARDIOGRAM (TEE);  Surgeon: Melrose Nakayama, MD;  Location: [place];  Service: Open Heart Surgery;  Laterality: N/A;   No current facility-administered medications on file prior to encounter.   Current Outpatient Medications on File Prior to Encounter  Medication Sig   aspirin EC 81 MG tablet Take 81 mg by mouth daily.   atorvastatin (LIPITOR) 20 MG tablet Take 1 tablet (20 mg total) by mouth at bedtime.   Calcium Carbonate-Vit D-Min (CALTRATE 600+D PLUS PO) Take 1 tablet by mouth daily.    clopidogrel (PLAVIX) 75 MG tablet Take 1 tablet (75 mg total) by mouth daily.   losartan (COZAAR) 25 MG tablet Take 1 tablet (25 mg total) by mouth daily.   metoprolol succinate (TOPROL-XL) 50 MG 24 hr tablet Take 1 tablet (50 mg total) by mouth in the morning and at bedtime. Take with or immediately following a meal.   potassium chloride SA (KLOR-CON) 20 MEQ tablet Take 20 mEq by mouth daily.   acetaminophen (TYLENOL) 500 MG tablet Take 1,000 mg by mouth every 6 (six) hours as needed for mild pain.   furosemide (LASIX) 20 MG tablet Take 1 tablet (20 mg total) by mouth daily as needed. For weight gain greater than 3 lbs overnight or 5 lbs in one week.    Allergies Allergies  Allergen Reactions   Ibuprofen Hives   Penicillins Hives    Tolerated Ancef in the past   Aleve [Naproxen Sodium] Hives   Avapro  [Irbesartan] Rash    Other reaction(s): Weal   Azithromycin Hives   Nickel Itching    Redness, itching and fluid discharge with nickel earrings   Other Other (See Comments)    Nickel"ears ran fluid and itching" redness and "sensitive" Nickel"ears ran fluid and itching"    Family History Family History  Problem Relation Age of Onset   Hypertension Mother    Arthritis Mother    Gout Mother    Lung cancer Father    Heart attack Sister    Social History  reports that she has quit smoking. She has never  used smokeless tobacco. She reports that she does not drink alcohol and does not use drugs.  Review Of Systems:  Constitutional: Positive for generalized weakness, but negative for fever and chills. Eyes: No visual changes. ENT: No sore throat. Cardiovascular: Denies chest pain but reports palpations and dyspnea Respiratory: Positive for shortness of breath. Gastrointestinal: No abdominal pain.  No nausea, no vomiting.  No diarrhea.  No constipation. Genitourinary: Negative for dysuria. Musculoskeletal: Negative for back pain. Skin: Negative for rash. Neurological: Negative for headaches, focal weakness or numbness VITAL SIGNS: BP 140/62   Pulse (!) 109   Temp (!) 97.2 F (36.2 C) (Oral)   Resp (!) 21   Ht 5' (1.524 m)   Wt 48.6 kg   SpO2 95%   BMI 20.93 kg/m   HEMODYNAMICS:   VENTILATOR SETTINGS:   BIPAP FiO2 40%, RR 12  INTAKE / OUTPUT: I/O last 3 completed shifts: In: 600 [P.O.:600] Out: 2375 [Urine:2375]  PHYSICAL EXAMINATION: General:  Chronically ill-looking,  normal WOB HEENT:  PERRLA, mild JVD Neuro:  AAO X 4, moves all extremities Cardiovascular:  AP regular, S1/S2 Lungs: Diffuse rhonchi and crackles in all lung fields Abdomen:  Non-tender, non-distended and  Musculoskeletal:  +rom, no deformities Skin:  warm and dry  LABS:  BMET Recent Labs  Lab 07/25/20 0411 07/25/20 1529 07/26/20 0621 07/27/20 0558  NA 140  --  137 139  K 2.9* 3.7 3.8 3.2*  CL 105  --  100 100  CO2 27  --  27 28  BUN 7*  --  17 21  CREATININE 0.49  --  0.76 0.68  GLUCOSE 96  --  95 103*     Electrolytes Recent Labs  Lab 07/25/20 0411 07/26/20 0621 07/27/20 0558  CALCIUM 8.9 8.7* 8.7*  MG 1.8 2.0  --   PHOS 4.7* 4.4  --      CBC Recent Labs  Lab 07/25/20 0411 07/26/20 0621 07/27/20 0558  WBC 4.8 5.5 4.3  HGB 13.2 11.8* 11.6*  HCT 39.3 35.9* 35.1*  PLT 191 172 157     Coag's No results for input(s): APTT, INR in the last 168 hours.  Sepsis  Markers Recent Labs  Lab 07/25/20 0411  PROCALCITON <0.10     ABG No results for input(s): PHART, PCO2ART, PO2ART in the last 168 hours.  Liver Enzymes Recent Labs  Lab 07/26/20 0621 07/27/20 0558  AST 20 20  ALT 11 11  ALKPHOS 56 63  BILITOT 1.3* 1.0  ALBUMIN 3.2* 3.2*     Cardiac Enzymes No results for input(s): TROPONINI, PROBNP in the last 168 hours.  Glucose Recent Labs  Lab 07/25/20 0017  GLUCAP 119*     Imaging No results found.  STUDIES:  Echo pending  CULTURES: None  ANTIBIOTICS: NONE   LINES/TUBES: PIVs  ASSESSMENT / PLAN:  PULMONARY A: Acute hypoxic respiratory failure COVID-19 + from previous infection  She has acute on chronic HFrEF Cardio evaluation in progress - appreciate input [facility] Repeat TTE with EF 25%  CARDIOVASCULAR A:  Acute/chronic Systolic CHF exacerbation CAD s/p CABG X4 vessels Hypertension P:  Iv lasix 2-D echo Resume all home medications   RENAL A:   Hypokalemia P:   IV potassium   Best Practice: Code Status:Full code Diet: NPO while on BiPAP and heart healthy diet once off BiPAP GI prophylaxis: PPI VTE prophylaxis:  Lovenox  FAMILY  - met with daughter and reviewed plan and findings  Critical care provider statement:    Critical care time (minutes):  33   Critical care time was exclusive of:  Separately billable procedures and  treating other patients   Critical care was necessary to treat or prevent imminent or  life-threatening deterioration of the following conditions:  Acute hypoxemic respiratory failure, Acute on chronic systolic CHF exacerbation multiple comorbid conditions   Critical care was time spent personally by me on the following  activities:  Development of treatment plan with patient or surrogate,  discussions with consultants, evaluation of patient's response to  treatment, examination of patient, obtaining history from patient or  surrogate, ordering and performing  treatments and interventions, ordering  and review of laboratory studies and re-evaluation of patient's condition   I assumed direction of critical care for this patient from another  provider in my specialty: no     Ottie Glazier, M.D.  [facility] [place] [facility]     07/27/2020, 2:52 PM

## 2020-07-27 NOTE — Plan of Care (Signed)

## 2020-07-27 NOTE — Interval H&P Note (Signed)
History and Physical Interval Note:  07/27/2020 8:10 AM  [name]  has presented today for surgery, with the diagnosis of cardiomyopathy.  The various methods of treatment have been discussed with the patient and family. After consideration of risks, benefits and other options for treatment, the patient has consented to  Procedure(s): LEFT HEART CATH AND CORONARY ANGIOGRAPHY (N/A) as a surgical intervention.  The patient's history has been reviewed, patient examined, no change in status, stable for surgery.  I have reviewed the patient's chart and labs.  Questions were answered to the patient's satisfaction.     [name]

## 2020-07-27 NOTE — Progress Notes (Signed)
Initial Nutrition Assessment  DOCUMENTATION CODES:   Not applicable  INTERVENTION:   Ensure Enlive po TID, each supplement provides 350 kcal and 20 grams of protein  Magic cup TID with meals, each supplement provides 290 kcal and 9 grams of protein  Pt at high refeed risk; recommend monitor potassium, magnesium and phosphorus labs daily until stable  NUTRITION DIAGNOSIS:   Increased nutrient needs related to cancer and cancer related treatments, acute illness (COVID 19) as evidenced by estimated needs.  GOAL:   Patient will meet greater than or equal to 90% of their needs  MONITOR:   PO intake, Supplement acceptance, Labs, Weight trends, Skin, I & O's  REASON FOR ASSESSMENT:   Malnutrition Screening Tool    ASSESSMENT:   80 y.o. female with a history of CHF, CAD s/p CABG x 4, PAD, HTN, HLD, hyperlipidemia, breast cancer s/p chemo and radiation and left lower lobe cavitary lesion noted in April 2022 who is admitted with COVID 19 and ischemic cardiomyopathy  Pt s/p right and left cardiac catheterization with graft angiography via the right femoral artery and vein today   Unable to see pt today as pt having cardiac cath at time of RD visit. Per chart, pt is down 23lbs(18%) over the past 7 months; this is significant weight loss. Suspect pt with decreased appetite and oral intake pta. RD will add supplements to help pt meet her estimated needs. Pt is at high risk for malnutrition. RD will obtain nutrition related history and exam at follow.   Medications reviewed and include: aspirin, Calcium citrate/vit D, plavix, lovenox, lasix, protonix, KCl  Labs reviewed: K 3.2(L) P 4.4 wnl, Mg 2.0 wnl- 7/13  NUTRITION - FOCUSED PHYSICAL EXAM: Unable to perform at this time   Diet Order:   Diet Order             Diet Heart Room service appropriate? Yes; Fluid consistency: Thin  Diet effective now                  EDUCATION NEEDS:   Not appropriate for education at this  time  Skin:  Skin Assessment: Reviewed RN Assessment (ecchymosis)  Last BM:  7/13- type 6  Height:   Ht Readings from Last 1 Encounters:  07/27/20 5' (1.524 m)    Weight:   Wt Readings from Last 1 Encounters:  07/27/20 48.6 kg    Ideal Body Weight:  45.4 kg  BMI:  Body mass index is 20.93 kg/m.  Estimated Nutritional Needs:   Kcal:  1400-1600kcal/day  Protein:  70-80g/day  Fluid:  1.3-1.6L/day  Koleen Distance MS, RD, LDN Please refer to [facility] for RD and/or RD on-call/weekend/after hours pager

## 2020-07-27 NOTE — Consult Note (Signed)
PHARMACY CONSULT NOTE  Pharmacy Consult for Electrolyte Monitoring and Replacement   Recent Labs: Potassium (mmol/L)  Date Value  07/27/2020 3.2 (L)   Magnesium (mg/dL)  Date Value  07/26/2020 2.0   Calcium (mg/dL)  Date Value  07/27/2020 8.7 (L)   Albumin (g/dL)  Date Value  07/27/2020 3.2 (L)   Phosphorus (mg/dL)  Date Value  07/26/2020 4.4   Sodium (mmol/L)  Date Value  07/27/2020 139   Assessment: Patient is a 80 y/o F with medical history including systolic HF (last EF 33-38%, EF this admission 20-25%), history of breast cancer, PAD, CAD s/p CABG in 04/2020 who is admitted with decompensated CHF / COVID-19 infection. Pharmacy has been consulted to assist with electrolyte monitoring and replacement as indicated.  Weaned to 3L [place]  Diuretics: IV Lasix 40 mg BID  Goal of Therapy:  Electrolytes within normal limits  Plan: --K 3.2, PO Kcl 40 mEq x 2 doses --Follow-up all electrolytes with AM labs tomorrow  [name] 07/27/2020 11:19 AM

## 2020-07-27 NOTE — Evaluation (Signed)
Physical Therapy Evaluation Patient Details Name: [name] MRN: [id number] DOB: 1940-07-04 Today's Date: 07/27/2020   History of Present Illness  [name] is a 72yoF who [facility] for acute onset respiratory failure 2/2 COVID19 infection and volume overload. Pt required admission to CCU same day. PMH: CAD s/p CABG April 2022, sCHF last EF 40-45%, EF this admission 20-25%, BrCA s/p radiation and chemo. 7/12 ID felf imaging to reflect old infection (likely June) not an active COVID infection. Pt underwent cardiac cath on 7/14, Rt femoral access.  Clinical Impression  Pt admitted with above diagnosis. Pt currently with functional limitations due to the deficits listed below (see "PT Problem List"). Upon entry, pt in bed, awake and agreeable to participate. The pt is alert, pleasant, interactive, and able to provide info regarding prior level of function, both in tolerance and independence. Pt on room air as received, remains on room air throughout evaluation, sats remain 90% or better throughout. Supervision level bed mobility, transfers, sustained standing. Pt does well with 5xSTS from recliner + RW, no physical assist needed, no loss of power during activity. Ones lines/leads are better managed, pt can easily transition to AMB household distances with RW, recommend monitoring of HR. Pt should attempt stairs prior to DC to home, as there are 5 at egress of home. Patient's performance this date reveals decreased ability, independence, and tolerance in performing all basic mobility required for performance of activities of daily living. Pt requires additional DME, close physical assistance, and cues for safe participate in mobility. Pt will benefit from skilled PT intervention to increase independence and safety with basic mobility in preparation for discharge to the venue listed below.       Follow Up Recommendations Home health PT;Supervision - Intermittent    Equipment  Recommendations  None recommended by PT    Recommendations for Other Services       Precautions / Restrictions Precautions Precautions: Fall Restrictions Weight Bearing Restrictions: No      Mobility  Bed Mobility Overal bed mobility: Needs Assistance Bed Mobility: Supine to Sit     Supine to sit: Supervision     General bed mobility comments: moves fairly well, uncocnerning; trunk control adequate    Transfers Overall transfer level: Needs assistance Equipment used: Rolling [place]ek (2 wheeled) Transfers: Sit to/from Stand Sit to Stand: Supervision;From elevated surface Stand pivot transfers: Supervision (moves well, authro maanges lines/leads.)       General transfer comment: ICU bed is slightly elevated from standard. Stands for 60sec without dizziness or excessive sway, often single UE support.  Ambulation/Gait                Stairs            Wheelchair Mobility    Modified Rankin (Stroke Patients Only)       Balance                                             Pertinent Vitals/Pain Pain Assessment: No/denies pain    Home Living Family/patient expects to be discharged to:: Private residence Living Arrangements: Alone Available Help at Discharge: Family;Available 24 hours/day (DTR Cinda Quest) Type of Home: House Home Access: Stairs to enter Entrance Stairs-Rails: Left Entrance Stairs-Number of Steps: 5 Home Layout: Able to live on main level with bedroom/bathroom;Two level Home Equipment: [place] - 2  wheels;[place] - 4 wheels;[place] - standard;Bedside commode      Prior Function Level of Independence: Independent         Comments: Pt reports being I with ADLs and IADLs. 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 Very active and still drives.     Hand Dominance   Dominant Hand: Right    Extremity/Trunk Assessment   Upper Extremity Assessment Upper Extremity Assessment: Generalized  weakness;Overall [facility] for tasks assessed    Lower Extremity Assessment Lower Extremity Assessment: Generalized weakness;Overall [facility] for tasks assessed    Cervical / Trunk Assessment Cervical / Trunk Assessment: Kyphotic  Communication   Communication: No difficulties  Cognition Arousal/Alertness: Awake/alert Behavior During Therapy: WFL for tasks assessed/performed Overall Cognitive Status: Within Functional Limits for tasks assessed                                        General Comments      Exercises Other Exercises Other Exercises: STS from recliner, RW ad lib, pace ad lib; no frank unsteadiness; 91-93% on RA. HR as high as 120bpm sustained, no angina/SOB   Assessment/Plan    PT Assessment Patient needs continued PT services  PT Problem List Decreased strength;Decreased activity tolerance;Decreased mobility       PT Treatment Interventions DME instruction;Balance training;Gait training;Stair training;Functional mobility training;Therapeutic activities;Therapeutic exercise;Patient/family education    PT Goals (Current goals can be found in the Care Plan section)  Acute Rehab PT Goals Patient Stated Goal: avoid deconditioning while admitted PT Goal Formulation: With patient Time For Goal Achievement: 08/10/20 Potential to Achieve Goals: Good    Frequency Min 2X/week   Barriers to discharge        Co-evaluation               AM-PAC PT "6 Clicks" Mobility  Outcome Measure Help needed turning from your back to your side while in a flat bed without using bedrails?: A Little Help needed moving from lying on your back to sitting on the side of a flat bed without using bedrails?: A Little Help needed moving to and from a bed to a chair (including a wheelchair)?: A Little Help needed standing up from a chair using your arms (e.g., wheelchair or bedside chair)?: A Little Help needed to walk in hospital room?: A Little Help needed climbing 3-5 steps  with a railing? : A Little 6 Click Score: 18    End of Session   Activity Tolerance: Patient tolerated treatment well;No increased pain Patient left: in chair;with call bell/phone within reach Nurse Communication: Mobility status ([place] not connected to anything upon arrival) PT Visit Diagnosis: Other abnormalities of gait and mobility (R26.89);Muscle weakness (generalized) (M62.81)    Time: 1443-1540 PT Time Calculation (min) (ACUTE ONLY): 34 min   Charges:   PT Evaluation $PT Eval High Complexity: 1 High PT Treatments $Therapeutic Exercise: 8-22 mins       2:31 PM, 07/27/20 Etta Grandchild, PT, DPT Physical Therapist - [facility]  [phone] ([place])    [place] C 07/27/2020, 2:26 PM

## 2020-07-28 ENCOUNTER — Encounter: Payer: Self-pay | Admitting: Cardiovascular Disease

## 2020-07-28 ENCOUNTER — Telehealth: Payer: Self-pay | Admitting: Cardiology

## 2020-07-28 DIAGNOSIS — E782 Mixed hyperlipidemia: Secondary | ICD-10-CM

## 2020-07-28 DIAGNOSIS — I420 Dilated cardiomyopathy: Secondary | ICD-10-CM | POA: Diagnosis not present

## 2020-07-28 DIAGNOSIS — J984 Other disorders of lung: Secondary | ICD-10-CM | POA: Diagnosis not present

## 2020-07-28 DIAGNOSIS — I5043 Acute on chronic combined systolic (congestive) and diastolic (congestive) heart failure: Secondary | ICD-10-CM | POA: Diagnosis not present

## 2020-07-28 DIAGNOSIS — J9601 Acute respiratory failure with hypoxia: Secondary | ICD-10-CM | POA: Diagnosis not present

## 2020-07-28 DIAGNOSIS — I25118 Atherosclerotic heart disease of native coronary artery with other forms of angina pectoris: Secondary | ICD-10-CM

## 2020-07-28 LAB — COMPREHENSIVE METABOLIC PANEL
ALT: 13 U/L (ref 0–44)
AST: 22 U/L (ref 15–41)
Albumin: 3.4 g/dL — ABNORMAL LOW (ref 3.5–5.0)
Alkaline Phosphatase: 66 U/L (ref 38–126)
Anion gap: 7 (ref 5–15)
BUN: 20 mg/dL (ref 8–23)
CO2: 30 mmol/L (ref 22–32)
Calcium: 9 mg/dL (ref 8.9–10.3)
Chloride: 100 mmol/L (ref 98–111)
Creatinine, Ser: 0.63 mg/dL (ref 0.44–1.00)
GFR, Estimated: >60 mL/min (ref >60)
Glucose, Bld: 104 mg/dL — ABNORMAL HIGH (ref 70–99)
Potassium: 4.3 mmol/L (ref 3.5–5.1)
Sodium: 137 mmol/L (ref 135–145)
Total Bilirubin: 1.3 mg/dL — ABNORMAL HIGH (ref 0.3–1.2)
Total Protein: 7.1 g/dL (ref 6.5–8.1)

## 2020-07-28 LAB — CBC
HCT: 37.5 % (ref 36.0–46.0)
Hemoglobin: 12.4 g/dL (ref 12.0–15.0)
MCH: 31.5 pg (ref 26.0–34.0)
MCHC: 33.1 g/dL (ref 30.0–36.0)
MCV: 95.2 fL (ref 80.0–100.0)
Platelets: 187 10*3/uL (ref 150–400)
RBC: 3.94 MIL/uL (ref 3.87–5.11)
RDW: 16.2 % — ABNORMAL HIGH (ref 11.5–15.5)
WBC: 4.8 10*3/uL (ref 4.0–10.5)
nRBC: 0 % (ref 0.0–0.2)

## 2020-07-28 LAB — D-DIMER, QUANTITATIVE: D-Dimer, Quant: 1.33 ug/mL-FEU — ABNORMAL HIGH (ref 0.00–0.50)

## 2020-07-28 LAB — C-REACTIVE PROTEIN: CRP: 2 mg/dL — ABNORMAL HIGH (ref <1.0)

## 2020-07-28 LAB — MAGNESIUM: Magnesium: 1.9 mg/dL (ref 1.7–2.4)

## 2020-07-28 MED ORDER — METOPROLOL SUCCINATE ER 50 MG PO TB24: 50.0000 mg | ORAL_TABLET | Freq: Every day | ORAL | 0 refills | Status: DC

## 2020-07-28 MED ORDER — POTASSIUM CHLORIDE CRYS ER 10 MEQ PO TBCR: 10.0000 meq | EXTENDED_RELEASE_TABLET | Freq: Every day | ORAL | 0 refills | Status: DC

## 2020-07-28 MED ORDER — NITROGLYCERIN 0.4 MG SL SUBL: 0.4000 mg | SUBLINGUAL_TABLET | SUBLINGUAL | 0 refills | Status: AC | PRN

## 2020-07-28 MED ORDER — FUROSEMIDE 40 MG PO TABS: 40.0000 mg | ORAL_TABLET | Freq: Every day | ORAL | 0 refills | Status: AC

## 2020-07-28 MED ORDER — METOPROLOL SUCCINATE ER 50 MG PO TB24: 50.0000 mg | ORAL_TABLET | Freq: Every day | ORAL | Status: DC

## 2020-07-28 MED ORDER — SACUBITRIL-VALSARTAN 24-26 MG PO TABS
1.0000 | ORAL_TABLET | Freq: Two times a day (BID) | ORAL | Status: DC
  Administered 2020-07-28: 1 via ORAL
  Filled 2020-07-28 (×2): qty 1

## 2020-07-28 MED ORDER — METOPROLOL SUCCINATE ER 50 MG PO TB24
25.0000 mg | ORAL_TABLET | Freq: Once | ORAL | Status: AC
  Administered 2020-07-28: 25 mg via ORAL
  Filled 2020-07-28: qty 1

## 2020-07-28 MED ORDER — FUROSEMIDE 20 MG PO TABS: 40.0000 mg | ORAL_TABLET | Freq: Every day | ORAL | Status: DC

## 2020-07-28 MED ORDER — SACUBITRIL-VALSARTAN 24-26 MG PO TABS: 1.0000 | ORAL_TABLET | Freq: Two times a day (BID) | ORAL | 0 refills | Status: DC

## 2020-07-28 NOTE — Consult Note (Signed)
PHARMACY CONSULT NOTE  Pharmacy Consult for Electrolyte Monitoring and Replacement   Recent Labs: Potassium (mmol/L)  Date Value  07/28/2020 4.3   Magnesium (mg/dL)  Date Value  07/28/2020 1.9   Calcium (mg/dL)  Date Value  07/28/2020 9.0   Albumin (g/dL)  Date Value  07/28/2020 3.4 (L)   Phosphorus (mg/dL)  Date Value  07/26/2020 4.4   Sodium (mmol/L)  Date Value  07/28/2020 137   Assessment: Patient is a 80 y/o F with medical history including systolic HF (last EF 21-58%, EF this admission 20-25%), history of breast cancer, PAD, CAD s/p CABG in 04/2020 who is admitted with decompensated CHF / COVID-19 infection. Pharmacy has been consulted to assist with electrolyte monitoring and replacement as indicated.  Weaned to 3L [place]  Diuretics: IV Lasix 40 mg BID  Goal of Therapy:  Electrolytes within normal limits  Plan: --No electrolyte replacement warranted at this time --Follow-up all electrolytes with AM labs tomorrow  [name] 07/28/2020 8:05 AM

## 2020-07-28 NOTE — Progress Notes (Signed)
Progress Note  Patient Name: [name] Date of Encounter: 07/28/2020  [facility] Cardiologist: Dr. Mylo Red  Subjective   Reports that she feels well, denies shortness of breath, no chest pain, no leg edema no PND orthopnea  Inpatient Medications    Scheduled Meds:  albuterol  2 puff Inhalation Q4H   aspirin EC  81 mg Oral Daily   atorvastatin  20 mg Oral Daily   calcium citrate-vitamin D   Oral Daily   Chlorhexidine Gluconate Cloth  6 each Topical Q0600   clopidogrel  75 mg Oral Daily   enoxaparin (LOVENOX) injection  40 mg Subcutaneous Q24H   feeding supplement  237 mL Oral TID BM   [START ON 07/29/2020] furosemide  40 mg Oral Daily   metoprolol succinate  25 mg Oral Once   [START ON 07/29/2020] metoprolol succinate  50 mg Oral Daily   pantoprazole  40 mg Oral Daily   sacubitril-valsartan  1 tablet Oral BID   sodium chloride flush  3 mL Intravenous Q12H   Continuous Infusions:  PRN Meds: acetaminophen, docusate sodium, nitroGLYCERIN, ondansetron (ZOFRAN) IV, polyethylene glycol   Vital Signs    Vitals:   07/28/20 0700 07/28/20 0800 07/28/20 0900 07/28/20 1300  BP: 125/61 123/67 122/66   Pulse: 93 88 92 99  Resp: (!) 23 19 (!) 23 (!) 25  Temp:   97.6 F (36.4 C)   TempSrc:   Oral   SpO2: 95% 97% 97% 95%  Weight:      Height:        Intake/Output Summary (Last 24 hours) at 07/28/2020 1308 Last data filed at 07/28/2020 1241 Gross per 24 hour  Intake 717 ml  Output 1300 ml  Net -583 ml   Last 3 Weights 07/28/2020 07/27/2020 07/27/2020  Weight (lbs) 105 lb 6.1 oz 107 lb 2.3 oz 107 lb 2.3 oz  Weight (kg) 47.8 kg 48.6 kg 48.6 kg      Telemetry    Normal sinus rhythm- Personally Reviewed  ECG    - Personally Reviewed  Physical Exam   GEN: No acute distress.   Neck: No JVD Cardiac: RRR, no murmurs, rubs, or gallops.  Respiratory: Clear to auscultation bilaterally. GI: Soft, nontender, non-distended  MS: No edema; No deformity. Neuro:  Nonfocal   Psych: Normal affect   Labs    High Sensitivity Troponin:   Recent Labs  Lab 07/24/20 1545  TROPONINIHS 22*      Chemistry Recent Labs  Lab 07/26/20 0621 07/27/20 0558 07/27/20 1754 07/28/20 0704  NA 137 139 139 137  K 3.8 3.2* 4.1 4.3  CL 100 100 102 100  CO2 27 28 30 30   GLUCOSE 95 103* 106* 104*  BUN 17 21 17 20   CREATININE 0.76 0.68 0.79 0.63  CALCIUM 8.7* 8.7* 9.2 9.0  PROT 6.2* 6.6  --  7.1  ALBUMIN 3.2* 3.2*  --  3.4*  AST 20 20  --  22  ALT 11 11  --  13  ALKPHOS 56 63  --  66  BILITOT 1.3* 1.0  --  1.3*  GFRNONAA >60 >60 >60 >60  ANIONGAP 10 11 7 7      Hematology Recent Labs  Lab 07/26/20 0621 07/27/20 0558 07/28/20 0704  WBC 5.5 4.3 4.8  RBC 3.80* 3.62* 3.94  HGB 11.8* 11.6* 12.4  HCT 35.9* 35.1* 37.5  MCV 94.5 97.0 95.2  MCH 31.1 32.0 31.5  MCHC 32.9 33.0 33.1  RDW 15.9* 16.1* 16.2*  PLT 172 157 187    BNP Recent Labs  Lab 07/24/20 1545  BNP 745.6*     DDimer  Recent Labs  Lab 07/26/20 0621 07/27/20 0558 07/28/20 0704  DDIMER 1.02* 1.06* 1.33*     Radiology    CARDIAC CATHETERIZATION  Result Date: 07/27/2020 Formatting of this result is different from the original.   Ost LAD to Prox LAD lesion is 80% stenosed.   Ost Cx to Prox Cx lesion is 80% stenosed.   Ost RCA to Prox RCA lesion is 60% stenosed.   Mid RCA lesion is 100% stenosed.   Ost LM to Dist LM lesion is 90% stenosed.   SVG graft was visualized by angiography and is normal in caliber.   SVG graft was visualized by angiography and is normal in caliber.   LIMA graft was visualized by non-selective angiography and is normal in caliber.   The graft exhibits no disease.   The graft exhibits no disease.   The graft exhibits no disease. 1.  Severe underlying left main and three-vessel coronary artery disease with patent grafts including LIMA to LAD, SVG to OM1 and SVG to right PDA/OM. 2.  Left ventricular angiography was not performed.  EF was severely reduced by echo. 3.   Significant peripheral arterial disease with heavy calcified vessels.  There is evidence of moderate to severe disease affecting the right common femoral artery. 4.  Right heart catheterization showed moderately elevated filling pressures, mild pulmonary hypertension and mildly reduced cardiac output. 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  Her grafts are patent.  Continue medical therapy for coronary artery disease. She is still volume overloaded and thus recommending continuing IV diuresis.  Optimize heart failure medications.    Cardiac Studies   Echocardiogram July 25, 2020  1. Left ventricular ejection fraction, by estimation, is 20 to 25%. The  left ventricle has severely decreased function. The left ventricle  demonstrates global hypokinesis. The left ventricular internal cavity size  was mildly to moderately dilated. Left  ventricular diastolic parameters were normal.   2. Right ventricular systolic function is normal. The right ventricular  size is mildly enlarged.   3. Left atrial size was mildly dilated.   4. Right atrial size was mildly dilated.   5. Moderate pleural effusion in the left lateral region.   6. The mitral valve is grossly normal. Moderate mitral valve  regurgitation.   7. The aortic valve is grossly normal. Aortic valve regurgitation is  trivial.   Patient Profile     [name] is a 80 y.o. 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, HFrEF EF 35-40%, LBBB, PVD s/p intervention of the left common iliac artery in 2000, left sided breast cancer s/p lumpectomy and radiation, HTN, HLD, prior tobacco use who is being seen 07/25/2020 for the evaluation of reduced EF/acute CHF  Assessment & Plan    Acute respiratory distress  Echocardiogram this admission with severely reduced ejection fraction 20%, global hypokinesis  Also with COVID effusion on the left  -CT scan chest confirming bilateral effusions pulmonary vascular  congestion venous reflux to IVC and developing pulmonary edema  -Dramatic improvement of her symptoms with better blood pressure control, diuresis with IV Lasix Management of cardiomyopathy as below    Cardiomyopathy, dilated Dramatic drop in ejection fraction, Cardiac catheterization confirming patent grafts, drop in EF out of proportion to her coronary disease -This admission started on Entresto 24/26 mg twice daily  Will increase  metoprolol to succinate 50 mg daily Lasix 40 daily Add spironolactone as outpatient, Wilder Glade as outpatient Will need close outpatient follow-up of her potassium level    COVID-positive  Asymptomatic, being treated with remdesivir  No sick contacts  [place] tail end of her infection       Coronary disease with stable angina, history of CABG  Left main disease, severe calcification three-vessel disease  s/p CABG (LIMA to LAD, SVG to PDA, sequential OM SVG to OM1) 04/2020  Catheterization this admission with patent grafts    Total encounter time more than 35 minutes  Greater than 50% was spent in counseling and coordination of care with the patient    For questions or updates, please contact [place] Please consult [email or web address] for contact info under        Signed, Ida Rogue, MD  07/28/2020, 1:08 PM

## 2020-07-28 NOTE — Progress Notes (Signed)
PROGRESS NOTE  [name]  DOB: [birth year]  PCP: Derinda Late, MD [id number]  DOA: 07/24/2020  LOS: 4 days  Hospital Day: 5   Chief Complaint  Patient presents with   Shortness of Breath    Brief narrative: [name] is a 80 y.o. female with PMH significant for CAD/CABG in April 1610, systolic CHF, PAD, HTN, HLD, breast cancer status post chemo and radiation, on home oxygen as needed Patient presented to the ED on 7/11 with complaint of shortness of breath, tachycardia, weakness. ED work-up revealed diffuse lung opacities, bilateral pleural effusion, chronic cavitary lesion in the left posterior lower lobe. While in the ED, she became more tachypneic, tachycardic and hypoxic on 4 L oxygen and hence transition to BiPAP. Labs showed VBG with a PCO2 of 47, pH of 7.33, proBNP elevated to 745. She was given IV Lasix and admitted to ICU. Her COVID-19 PCR test came back positive.  Of note, patient is unvaccinated.  ICU course: Managed for acute exacerbation of CHF.  Asymptomatic with COVID.  7/14, underwent diagnostic cath.  Multivessel disease identified, medical management recommended. Transfer to hospital service on 7/14.   Subjective: Patient was seen and examined this morning in ICU.  Pleasant elderly Caucasian female.  Sitting up in chair.  Not in distress.  Not on supplemental oxygen. Labs this morning unremarkable  Assessment/Plan: Acute exacerbation of CHF Ischemic cardiomyopathy, worsening EF -Echocardiogram 7/14 showed LVEF low at 20 to 25%, global hypokinesis.  Prior echo had shown EF of 40 to 45%. -Currently on IV Lasix 40 mg twice daily -Net IO Since Admission: -3,975.21 mL [07/28/20 1233] -Continue to monitor for daily intake output, weight, blood pressure, BNP, renal function and electrolytes. -Also on metoprolol succinate.  Noted a plan from cardiology to start [facility]. Recent Labs  Lab 07/24/20 1545 07/25/20 0411 07/25/20 1529 07/26/20 0621  07/27/20 0558 07/27/20 1754 07/28/20 0704  BNP 745.6*  --   --   --   --   --   --   BUN 7* 7*  --  17 21 17 20   CREATININE 0.64 0.49  --  0.76 0.68 0.79 0.63  K 3.2* 2.9* 3.7 3.8 3.2* 4.1 4.3  MG  --  1.8  --  2.0  --   --  1.9   CAD/CABG x4 Hyperlipidemia -7/14 cardiac cath severe underlying triple-vessel CAD with patent graft, medical management recommended. -Continue aspirin, Plavix, statin  Acute respiratory failure with hypoxia -Presented with shortness of breath, bilateral pleural effusion.  Initially got BiPAP.  Currently on room air  COVID infection -Asymptomatic.  CT value high at 35 -Currently not on treatment  Breast cancer -Status post chemo and radiation  Mobility: Encourage ambulation.   Code Status:   Code Status: Full Code  Nutritional status: Body mass index is 20.58 kg/m. Nutrition Problem: Increased nutrient needs Etiology: cancer and cancer related treatments, acute illness (COVID 19) Signs/Symptoms: estimated needs Diet:  Diet Order             Diet Heart Room service appropriate? Yes; Fluid consistency: Thin  Diet effective now                  DVT prophylaxis:  enoxaparin (LOVENOX) injection 40 mg Start: 07/25/20 0800 SCDs Start: 07/24/20 2326   Antimicrobials: None Fluid: None Consultants: Cardiology Family Communication: None at bedside  Status is: Inpatient  Remains inpatient appropriate because: CHF on IV diuresis  Dispo: The patient is from: Home  Anticipated d/c is to: Home with home health              Patient currently is not medically stable to d/c.   Difficult to place patient No     Infusions:    Scheduled Meds:  albuterol  2 puff Inhalation Q4H   aspirin EC  81 mg Oral Daily   atorvastatin  20 mg Oral Daily   calcium citrate-vitamin D   Oral Daily   Chlorhexidine Gluconate Cloth  6 each Topical Q0600   clopidogrel  75 mg Oral Daily   enoxaparin (LOVENOX) injection  40 mg Subcutaneous Q24H    feeding supplement  237 mL Oral TID BM   furosemide  40 mg Intravenous BID   metoprolol succinate  25 mg Oral Daily   pantoprazole  40 mg Oral Daily   sacubitril-valsartan  1 tablet Oral BID   sodium chloride flush  3 mL Intravenous Q12H    Antimicrobials: Anti-infectives (From admission, onward)    Start     Dose/Rate Route Frequency Ordered Stop   07/26/20 1000  remdesivir 100 mg in sodium chloride 0.9 % 100 mL IVPB  Status:  Discontinued       See Hyperspace for full Linked Orders Report.   100 mg 200 mL/hr over 30 Minutes Intravenous Daily 07/25/20 0925 07/25/20 2133   07/25/20 1015  remdesivir 200 mg in sodium chloride 0.9% 250 mL IVPB       See Hyperspace for full Linked Orders Report.   200 mg 580 mL/hr over 30 Minutes Intravenous Once 07/25/20 0925 07/25/20 1339   07/25/20 1000  nirmatrelvir/ritonavir EUA (PAXLOVID) TABS 3 tablet  Status:  Discontinued        3 tablet Oral 2 times daily 07/25/20 0529 07/25/20 0541   07/25/20 1000  nirmatrelvir/ritonavir EUA (renal dosing) (PAXLOVID) TABS 2 tablet  Status:  Discontinued        2 tablet Oral 2 times daily 07/25/20 0542 07/25/20 0924       PRN meds: acetaminophen, docusate sodium, nitroGLYCERIN, ondansetron (ZOFRAN) IV, polyethylene glycol   Objective: Vitals:   07/28/20 0800 07/28/20 0900  BP: 123/67 122/66  Pulse: 88 92  Resp: 19 (!) 23  Temp:  97.6 F (36.4 C)  SpO2: 97% 97%    Intake/Output Summary (Last 24 hours) at 07/28/2020 1233 Last data filed at 07/28/2020 0846 Gross per 24 hour  Intake 597 ml  Output 1300 ml  Net -703 ml   Filed Weights   07/27/20 0424 07/27/20 0700 07/28/20 0500  Weight: 48.6 kg 48.6 kg 47.8 kg   Weight change: -0.8 kg Body mass index is 20.58 kg/m.   Physical Exam: General exam: Pleasant, elderly thin built Caucasian female.  Not in distress Skin: No rashes, lesions or ulcers. HEENT: Atraumatic, normocephalic, no obvious bleeding Lungs: Clear to auscultation  bilaterally CVS: Tachycardic, no murmur GI/Abd soft, nontender, nondistended, bowel sound present CNS: Alert, awake, oriented x3 Psychiatry: Mood appropriate Extremities: No pedal edema, no calf tenderness  Data Review: I have personally reviewed the laboratory data and studies available.  Recent Labs  Lab 07/24/20 1545 07/25/20 0411 07/26/20 0621 07/27/20 0558 07/28/20 0704  WBC 4.6 4.8 5.5 4.3 4.8  HGB 12.6 13.2 11.8* 11.6* 12.4  HCT 37.7 39.3 35.9* 35.1* 37.5  MCV 93.8 94.2 94.5 97.0 95.2  PLT 191 191 172 157 187   Recent Labs  Lab 07/25/20 0411 07/25/20 1529 07/26/20 0621 07/27/20 0558 07/27/20 1754 07/28/20 0704  NA 140  --  137 139 139 137  K 2.9* 3.7 3.8 3.2* 4.1 4.3  CL 105  --  100 100 102 100  CO2 27  --  27 28 30 30   GLUCOSE 96  --  95 103* 106* 104*  BUN 7*  --  17 21 17 20   CREATININE 0.49  --  0.76 0.68 0.79 0.63  CALCIUM 8.9  --  8.7* 8.7* 9.2 9.0  MG 1.8  --  2.0  --   --  1.9  PHOS 4.7*  --  4.4  --   --   --     F/u labs ordered Unresulted Labs (From admission, onward)     Start     Ordered   07/26/20 0500  CBC  Daily,   R     Question:  Specimen collection method  Answer:  Lab=Lab collect   07/25/20 0938   07/26/20 0500  Comprehensive metabolic panel  Daily,   R     Question:  Specimen collection method  Answer:  Lab=Lab collect   07/25/20 0938   07/26/20 0500  C-reactive protein  Daily,   R     Question:  Specimen collection method  Answer:  Lab=Lab collect   07/25/20 0938   07/26/20 0500  D-dimer, quantitative  Daily,   R      07/25/20 8341            Signed, Terrilee Croak, MD [facility] 07/28/2020

## 2020-07-28 NOTE — Evaluation (Signed)
Physical Therapy Evaluation Patient Details Name: TIEARA FLITTON MRN: [id number] DOB: [birth year] Today's Date: 07/28/2020   History of Present Illness  Maranda Marte is a 89yoF who [facility] for acute onset respiratory failure 2/2 COVID19 infection and volume overload. Pt required admission to CCU same day. PMH: CAD s/p CABG April 2022, sCHF last EF 40-45%, EF this admission 20-25%, BrCA s/p radiation and chemo. 7/12 ID felf imaging to reflect old infection (likely June) not an active COVID infection. Pt underwent cardiac cath on 7/14, Rt femoral access. Plan to restart BB 7/15.  Clinical Impression  Pt in bed upon entry, meal eaten, pt agreeable to session. Pt moved to RA throughout, sats remain 90-93%. Pt does transfers, AMB without RW, 2 trails of AMB, 37f and ~1562f both limited by poorly controlled tachycardia up to 150s BPM. Mild unsteadiness, but near baseline per patient. [place] toward goals overall.     Follow Up Recommendations Home health PT;Supervision - Intermittent    Equipment Recommendations  None recommended by PT    Recommendations for Other Services       Precautions / Restrictions Precautions Precautions: Fall Restrictions Weight Bearing Restrictions: No      Mobility  Bed Mobility Overal bed mobility: Needs Assistance Bed Mobility: Supine to Sit     Supine to sit: Modified independent (Device/Increase time);HOB elevated     General bed mobility comments: lines/leads in ICU are off the hook    Transfers Overall transfer level: Needs assistance Equipment used: None Transfers: Sit to/from Stand Sit to Stand: Supervision         General transfer comment: no RW needed from EOB or recliner  Ambulation/Gait Ambulation/Gait assistance: Supervision Gait Distance (Feet): 144 Feet Assistive device: None       General Gait Details: 3 tortuous laps in ICU room (on air/con ISO) mild  unsteadiness with u-turns, but improves with cues; No O2 needs; HR quickly jumps >130bpm first AMB of 485fbut 2nd AMB does not go >130bpm until ~100f44fDOE near baseline)  [employer] (Stroke Patients Only)       Balance                                             Pertinent Vitals/Pain Pain Assessment: No/denies pain    Home Living                        Prior Function                 Hand Dominance        Extremity/Trunk Assessment                Communication      Cognition Arousal/Alertness: Awake/alert Behavior During Therapy: WFL for tasks assessed/performed Overall Cognitive Status: Within Functional Limits for tasks assessed                                        General Comments      Exercises     Assessment/Plan    PT Assessment    PT Problem List  PT Treatment Interventions      PT Goals (Current goals can be found in the Care Plan section)  Acute Rehab PT Goals Patient Stated Goal: avoid deconditioning while admitted PT Goal Formulation: With patient Time For Goal Achievement: 08/10/20 Potential to Achieve Goals: Good    Frequency Min 2X/week   Barriers to discharge        Co-evaluation               AM-PAC PT "6 Clicks" Mobility  Outcome Measure Help needed turning from your back to your side while in a flat bed without using bedrails?: A Little Help needed moving from lying on your back to sitting on the side of a flat bed without using bedrails?: A Little Help needed moving to and from a bed to a chair (including a wheelchair)?: A Little Help needed standing up from a chair using your arms (e.g., wheelchair or bedside chair)?: A Little Help needed to walk in hospital room?: A Little Help needed climbing 3-5 steps with a railing? : A Little 6 Click Score: 18    End of Session   Activity Tolerance:  Patient tolerated treatment well;No increased pain Patient left: in chair;with call bell/phone within reach Nurse Communication: Mobility status PT Visit Diagnosis: Other abnormalities of gait and mobility (R26.89);Muscle weakness (generalized) (M62.81)    Time: 3958-4417 PT Time Calculation (min) (ACUTE ONLY): 23 min   Charges:     PT Treatments $Therapeutic Exercise: 23-37 mins       10:23 AM, 07/28/20 Etta Grandchild, PT, DPT Physical Therapist - [facility]  [phone] ([place])    Blasa Raisch C 07/28/2020, 10:21 AM

## 2020-07-28 NOTE — Telephone Encounter (Signed)
-----   Message from Minna Merritts, MD sent at 07/28/2020  2:49 PM EDT ----- Leaving hospital July 15 Needs follow-up in clinic <14 days [facility] or visser

## 2020-07-28 NOTE — TOC Initial Note (Signed)
Transition of Care [facility]) - Initial/Assessment Note    Patient Details  Name: [name] MRN: [id number] Date of Birth: Mar 15, 1940  [facility] CM/SW Contact:    Ova Freshwater Phone Number: [phone] 07/28/2020, 1:07 PM  Clinical Narrative:                  Patient presents to [facility] due to SOB for 5 days with non-productive cough.  Patient is COVID positive. CSW spoke with Bernette Mayers (Daughter) [phone] [facility]) for collateral information.  CSW explained the role of TOC in patient care.  CSW updated Ms. Bambury about PT/OT recommendations for home health.  Ms. Margaretha Glassing stated the patient did not have a home health agency preference.  Ms. Margaretha Glassing stated the patient has DME at home, [place], commode, walk-in shower w/ seating.  Ms. Margaretha Glassing stated the patient is on O2 at home, but did not know which agency supplied her oxygen.  Ms. Margaretha Glassing stated she would contact this CSW to give me that name.    CSW contacted Corene Cornea w/ [facility] who stated he would be able to take the patient for home health PT, OT, Aide and RN.  CSW updated Attending and Unit RN on home health orders.  Expected Discharge Plan: [place] Barriers to Discharge: Continued Medical Work up   Patient Goals and CMS Choice Patient states their goals for this hospitalization and ongoing recovery are:: to return home CMS Medicare.gov Compare Post Acute Care list provided to:: Other (Comment Required) Bernette Mayers (Daughter)   [phone] (Mobile)) Choice offered to / list presented to : Adult Children Bernette Mayers (Daughter)   [phone] (Mobile))  Expected Discharge Plan and Services Expected Discharge Plan: [place] In-house Referral: Clinical Social Work   Post Acute Care Choice: [place] arrangements for the past 2 months: [place]: RN, PT, OT, Nurse's Aide [place] Agency:  [place] ([facility]) Date HH Agency Contacted: 07/28/20 Time [place]: [phone] Representative spoke with at [place]: [place]  Prior Living Arrangements/Services Living arrangements for the past 2 months: [place] with:: Self Patient language and need for interpreter reviewed:: Yes Do you feel safe going back to the place where you live?: Yes      Need for Family Participation in Patient Care: Yes (Comment)   Current home services: DME (O2) Criminal Activity/Legal Involvement Pertinent to Current Situation/Hospitalization: No - Comment as needed  Activities of Daily Living Home Assistive Devices/Equipment: None ADL Screening (condition at time of admission) Patient's cognitive ability adequate to safely complete daily activities?: Yes Is the patient deaf or have difficulty hearing?: No Does the patient have difficulty seeing, even when wearing glasses/contacts?: No Does the patient have difficulty concentrating, remembering, or making decisions?: No Patient able to express need for assistance with ADLs?: Yes Does the patient have difficulty dressing or bathing?: No Independently performs ADLs?: Yes (appropriate for developmental age) Does the patient have difficulty walking or climbing stairs?: No Weakness of Legs: None Weakness of Arms/Hands: None  Permission Sought/Granted Permission sought to share information with : Family Supports Permission granted to share information with : Yes, Verbal Permission Granted  Share Information with NAME: Bernette Mayers (Daughter)   [phone] (Mobile)  Emotional Assessment Appearance:: Appears stated age Attitude/Demeanor/Rapport: Engaged Affect (typically observed): Stable Orientation: : Oriented to Self, Oriented to Place, Oriented to  Time, Oriented to Situation Alcohol / Substance Use: Not Applicable Psych Involvement: No (comment)  Admission diagnosis:  Acute  exacerbation of CHF (congestive heart failure) (HCC) [I50.9] Acute respiratory failure with hypoxia (HCC) [J96.01] Acute on chronic HFrEF (heart failure with reduced ejection fraction) (HCC) [I50.23] Acute on chronic congestive heart failure, unspecified heart failure type ([place]) [I50.9] Patient Active Problem List   Diagnosis Date Noted   Unstable angina ([place])    COVID-19 virus infection    Acute on chronic HFrEF (heart failure with reduced ejection fraction) ([place]) 07/24/2020   Coronary artery disease 07/24/2020   Acute respiratory failure with hypoxia ([place]) 07/24/2020   Acute exacerbation of CHF (congestive heart failure) ([place]) 07/24/2020   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 05/30/2020   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   NSTEMI (non-ST elevated myocardial infarction) ([place]) 04/30/2020   Elevated troponin 04/30/2020   History of left breast cancer 11/25/2019   Hyperlipidemia 11/25/2019   Carotid stenosis 08/24/2017   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   Pain 01/22/2016   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

## 2020-07-28 NOTE — Plan of Care (Signed)
  Problem: Education: Goal: Knowledge of General Education information will improve Description: Including pain rating scale, medication(s)/side effects and non-pharmacologic comfort measures Outcome: Adequate for Discharge   Problem: Health Behavior/Discharge Planning: Goal: Ability to manage health-related needs will improve Outcome: Adequate for Discharge   Problem: Clinical Measurements: Goal: Ability to maintain clinical measurements within normal limits will improve Outcome: Adequate for Discharge Goal: Will remain free from infection Outcome: Adequate for Discharge Goal: Diagnostic test results will improve Outcome: Adequate for Discharge Goal: Respiratory complications will improve Outcome: Adequate for Discharge Goal: Cardiovascular complication will be avoided Outcome: Adequate for Discharge   Problem: Activity: Goal: Risk for activity intolerance will decrease Outcome: Adequate for Discharge   Problem: Nutrition: Goal: Adequate nutrition will be maintained Outcome: Adequate for Discharge   Problem: Coping: Goal: Level of anxiety will decrease Outcome: Adequate for Discharge   Problem: Skin Integrity: Goal: Risk for impaired skin integrity will decrease Outcome: Adequate for Discharge   Problem: Safety: Goal: Ability to remain free from injury will improve Outcome: Adequate for Discharge   Problem: Acute Rehab PT Goals(only PT should resolve) Goal: Pt Will Ambulate Outcome: Adequate for Discharge Goal: Pt Will Go Up/Down Stairs Outcome: Adequate for Discharge

## 2020-07-28 NOTE — Telephone Encounter (Signed)
Left message will return call to schedule

## 2020-07-28 NOTE — Discharge Summary (Signed)
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 female with PMH significant for CAD/CABG in April 7741, systolic CHF, PAD, HTN, HLD, breast cancer status post chemo and radiation, on home oxygen as needed Patient presented to the ED on 7/11 with complaint of shortness of breath, tachycardia, weakness. ED work-up revealed diffuse lung opacities, bilateral pleural effusion, chronic cavitary lesion in the left posterior lower lobe. While in the ED, she became more tachypneic, tachycardic and hypoxic on 4 L oxygen and hence transition to BiPAP. Labs showed VBG with a PCO2 of 47, pH of 7.33, proBNP elevated to 745. She was given IV Lasix and admitted to ICU. Her COVID-19 PCR test came back positive.  Of note, patient is unvaccinated.  ICU course: Managed for acute exacerbation of CHF.  Asymptomatic with COVID.  7/14, underwent diagnostic cath.  Multivessel disease identified, medical management recommended. Transfer to hospital service on 7/14.   Subjective: Patient was seen and examined this morning in ICU.  Pleasant elderly Caucasian female.  Sitting up in chair.  Not in distress.  Not on  supplemental oxygen. Labs this morning unremarkable  Hospital course: Acute exacerbation of CHF Ischemic cardiomyopathy, worsening EF -Echocardiogram 7/14 showed LVEF low at 20 to 25%, global hypokinesis.  Prior echo had shown EF of 40 to 45%. -Adequately diuresed with IV Lasix 40 mg twice daily -Net IO Since Admission: -4,055.21 mL [07/28/20 1452] -Discussed with cardiologist Dr. Rockey Situ this morning.  We will discharge the patient on metoprolol succinate 50 mg daily, Entresto 24 mg / 26 mg twice daily and Lasix 40 mg daily with KCl 10 mill equivalent daily.   -Continue to monitor daily weight, blood pressure at home.  Follow-up with cardiology as an outpatient. Recent Labs  Lab 07/24/20 1545 07/25/20 0411 07/25/20 1529 07/26/20 0621 07/27/20 0558 07/27/20 1754 07/28/20 0704  BNP 745.6*  --   --   --   --   --   --   BUN 7* 7*  --  17 21 17 20   CREATININE 0.64 0.49  --  0.76 0.68 0.79 0.63  K 3.2* 2.9* 3.7 3.8 3.2* 4.1 4.3  MG  --  1.8  --  2.0  --   --  1.9   CAD/CABG x4 Hyperlipidemia -7/14 cardiac cath severe underlying triple-vessel CAD with patent graft, medical management recommended. -Continue aspirin, Plavix, statin  Acute respiratory failure with hypoxia -Presented with shortness of breath, bilateral pleural effusion.  Initially got BiPAP.  Currently on room air  COVID infection -Asymptomatic.  CT value high at 35 -Currently not on treatment  Breast cancer -Status post chemo and radiation   Allergies as of 07/28/2020  Reactions   Ibuprofen Hives   Penicillins Hives   Tolerated Ancef in the past   Aleve [naproxen Sodium] Hives   Avapro  [irbesartan] Rash   Other reaction(s): Weal   Azithromycin Hives   Nickel Itching   Redness, itching and fluid discharge with nickel earrings   Other Other (See Comments)   Nickel"ears ran fluid and itching" redness and "sensitive" Nickel"ears ran fluid and itching"        Medication List     STOP taking  these medications    losartan 25 MG tablet Commonly known as: COZAAR       TAKE these medications    acetaminophen 500 MG tablet Commonly known as: TYLENOL Take 1,000 mg by mouth every 6 (six) hours as needed for mild pain.   aspirin EC 81 MG tablet Take 81 mg by mouth daily.   atorvastatin 20 MG tablet Commonly known as: Lipitor Take 1 tablet (20 mg total) by mouth at bedtime.   CALTRATE 600+D PLUS PO Take 1 tablet by mouth daily.   clopidogrel 75 MG tablet Commonly known as: Plavix Take 1 tablet (75 mg total) by mouth daily.   furosemide 40 MG tablet Commonly known as: LASIX Take 1 tablet (40 mg total) by mouth daily. Start taking on: July 29, 2020 What changed:  medication strength how much to take when to take this reasons to take this additional instructions   metoprolol succinate 50 MG 24 hr tablet Commonly known as: TOPROL-XL Take 1 tablet (50 mg total) by mouth daily. Take with or immediately following a meal. What changed: when to take this   nitroGLYCERIN 0.4 MG SL tablet Commonly known as: NITROSTAT Place 1 tablet (0.4 mg total) under the tongue every 5 (five) minutes as needed for up to 10 days for chest pain.   potassium chloride 10 MEQ tablet Commonly known as: KLOR-CON Take 1 tablet (10 mEq total) by mouth daily. What changed:  medication strength how much to take   sacubitril-valsartan 24-26 MG Commonly known as: ENTRESTO Take 1 tablet by mouth 2 (two) times daily.        Discharge Instructions:  Follow with Primary MD Derinda Late, MD in 7 days   Get CBC/BMP checked in next visit within 1 week by PCP or SNF MD ( we routinely change or add medications that can affect your baseline labs and fluid status, therefore we recommend that you get the mentioned basic workup next visit with your PCP, your PCP may decide not to get them or add new tests based on their clinical decision)  On your next visit with your PCP, please Get Medicines  reviewed and adjusted.  Please request your PCP  to go over all Hospital Tests and Procedure/Radiological results at the follow up, please get all Hospital records sent to your Prim MD by signing hospital release before you go home.  Activity: As tolerated with Full fall precautions use [place]/cane & assistance as needed  For Heart failure patients - Check your Weight same time everyday, if you gain over 2 pounds, or you develop in leg swelling, experience more shortness of breath or chest pain, call your Primary MD immediately. Follow Cardiac Low Salt Diet and 1.5 lit/day fluid restriction.  If you have smoked or chewed Tobacco in the last 2 yrs please stop smoking, stop any regular Alcohol  and or any Recreational drug use.  If you experience worsening of your admission symptoms, develop shortness of breath, life threatening emergency, suicidal  or homicidal thoughts you must seek medical attention immediately by calling 911 or calling your MD immediately  if symptoms less severe.  You Must read complete instructions/literature along with all the possible adverse reactions/side effects for all the Medicines you take and that have been prescribed to you. Take any new Medicines after you have completely understood and accpet all the possible adverse reactions/side effects.   Do not drive, operate heavy machinery, perform activities at heights, swimming or participation in water activities or provide baby sitting services if your were admitted for syncope or siezures until you have seen by Primary MD or a Neurologist and advised to do so again.  Do not drive when taking Pain medications.  Do not take more than prescribed Pain, Sleep and Anxiety Medications  Wear Seat belts while driving.   Please note You were cared for by a hospitalist during your hospital stay. If you have any questions about your discharge medications or the care you received while you were in the hospital after you are  discharged, you can call the unit and asked to speak with the hospitalist on call if the hospitalist that took care of you is not available. Once you are discharged, your primary care physician will handle any further medical issues. Please note that NO REFILLS for any discharge medications will be authorized once you are discharged, as it is imperative that you return to your primary care physician (or establish a relationship with a primary care physician if you do not have one) for your aftercare needs so that they can reassess your need for medications and monitor your lab values.    Follow ups:   Discharge Instructions     Diet - low sodium heart healthy   Complete by: As directed    Increase activity slowly   Complete by: As directed        Follow-up Information     Derinda Late, MD Follow up.   Specialty: Family Medicine Contact information: [address] [facility] [place] [id number] [phone]                 Wound care:   Incision (Closed) 01/24/16 Knee Right (Active)  Date First Assessed/Time First Assessed: 01/24/16 1424   Location: Knee  Location Orientation: Right    Assessments 01/24/2016  5:56 PM 01/26/2016  7:45 AM  Dressing Type -- Honeycomb  Dressing Clean;Dry;Intact Clean;Dry;Intact  Site / Wound Assessment Dressing in place / Unable to assess --  Drainage Amount None --     No Linked orders to display     Incision (Closed) 05/03/20 Chest (Active)  Date First Assessed/Time First Assessed: 05/03/20 1007   Location: Chest    Assessments 05/03/2020  2:00 PM 05/09/2020  4:00 AM  Dressing Type Gauze (Comment) Liquid skin adhesive  Dressing -- Clean  Dressing Change Frequency Daily PRN  Site / Wound Assessment -- Clean;Dry  Margins -- Attached edges (approximated)  Closure -- Approximated;Skin glue  Drainage Amount None None  Treatment -- Other (Comment)     No Linked orders to display      Incision (Closed) 05/03/20 Leg Right (Active)  Date First Assessed/Time First Assessed: 05/03/20 1007   Location: Leg  Location Orientation: Right    Assessments 05/03/2020  2:00 PM 05/09/2020  4:00 AM  Dressing Type Compression wrap Liquid skin adhesive  Dressing -- Clean  Dressing Change Frequency Daily Daily  Site / Wound Assessment -- Clean;Dry  Margins -- Attached edges (approximated)  Closure -- Approximated;Skin glue  Drainage Amount None None  Treatment -- Other (Comment)     No Linked orders to display    Discharge Exam:   Vitals:   07/28/20 0800 07/28/20 0900 07/28/20 1300 07/28/20 1410  BP: 123/67 122/66  106/63  Pulse: 88 92 99 (!) 102  Resp: 19 (!) 23 (!) 25 18  Temp:  97.6 F (36.4 C)    TempSrc:  Oral    SpO2: 97% 97% 95% 93%  Weight:      Height:        Body mass index is 20.58 kg/m.  General exam: Pleasant, elderly thin built Caucasian female.  Not in distress Skin: No rashes, lesions or ulcers. HEENT: Atraumatic, normocephalic, no obvious bleeding Lungs: Clear to auscultation bilaterally CVS: Tachycardic, no murmur GI/Abd soft, nontender, nondistended, bowel sound present CNS: Alert, awake, oriented x3 Psychiatry: Mood appropriate Extremities: No pedal edema, no calf tenderness  Time coordinating discharge: 35 minutes   The results of significant diagnostics from this hospitalization (including imaging, microbiology, ancillary and laboratory) are listed below for reference.    Procedures and Diagnostic Studies:   ECHOCARDIOGRAM COMPLETE  Result Date: 07/25/2020    ECHOCARDIOGRAM REPORT   Patient Name:   ABAIGEAL MOOMAW Date of Exam: 07/25/2020 Medical Rec #:  [id number]       Height:       60.0 in Accession #:    [id number]      Weight:       115.5 lb Date of Birth:  04/30/1940       BSA:          1.478 m Patient Age:    80 years        BP:           135/65 mmHg Patient Gender: F               HR:           91 bpm. Exam Location:  [facility] Procedure: 2D  Echo, Cardiac Doppler and Color Doppler Indications:     CHF-acute diastolic Q03.47  History:         Patient has prior history of Echocardiogram examinations, most                  recent 05/03/2020. Risk Factors:Hypertension. Personal hsitory                  of radiation therapy.  Sonographer:     Sherrie Sport RDCS (AE) Referring Phys:  [id number] [place] Diagnosing Phys: Bartholome Bill MD  Sonographer Comments: Suboptimal apical window. IMPRESSIONS  1. Left ventricular ejection fraction, by estimation, is 20 to 25%. The left ventricle has severely decreased function. The left ventricle demonstrates global hypokinesis. The left ventricular internal cavity size was mildly to moderately dilated. Left ventricular diastolic parameters were normal.  2. Right ventricular systolic function is normal. The right ventricular size is mildly enlarged.  3. Left atrial size was mildly dilated.  4. Right atrial size was mildly dilated.  5. Moderate pleural effusion in the left lateral region.  6. The mitral valve is grossly normal. Moderate mitral valve regurgitation.  7. The aortic valve is grossly normal. Aortic valve regurgitation is trivial. FINDINGS  Left Ventricle: Left ventricular ejection fraction, by estimation, is 20 to 25%. The left ventricle has severely decreased function. The left ventricle demonstrates global hypokinesis. The left ventricular internal cavity size was mildly  to moderately dilated. There is no left ventricular hypertrophy. Left ventricular diastolic parameters were normal. Right Ventricle: The right ventricular size is mildly enlarged. No increase in right ventricular wall thickness. Right ventricular systolic function is normal. Left Atrium: Left atrial size was mildly dilated. Right Atrium: Right atrial size was mildly dilated. Pericardium: There is no evidence of pericardial effusion. Mitral Valve: The mitral valve is grossly normal. Moderate mitral valve regurgitation. Tricuspid Valve:  The tricuspid valve is grossly normal. Tricuspid valve regurgitation is trivial. Aortic Valve: The aortic valve is grossly normal. Aortic valve regurgitation is trivial. Aortic valve mean gradient measures 1.0 mmHg. Aortic valve peak gradient measures 2.1 mmHg. Aortic valve area, by VTI measures 2.98 cm. Pulmonic Valve: The pulmonic valve was not well visualized. Pulmonic valve regurgitation is not visualized. Aorta: The aortic root is normal in size and structure. IAS/Shunts: The atrial septum is grossly normal. Additional Comments: There is a moderate pleural effusion in the left lateral region.  LEFT VENTRICLE PLAX 2D LVIDd:         4.57 cm      Diastology LVIDs:         4.24 cm      LV e' medial:    3.15 cm/s LV PW:         1.12 cm      LV E/e' medial:  26.7 LV IVS:        1.38 cm      LV e' lateral:   7.62 cm/s LVOT diam:     2.00 cm      LV E/e' lateral: 11.0 LV SV:         35 LV SV Index:   24 LVOT Area:     3.14 cm  LV Volumes (MOD) LV vol d, MOD A2C: 82.2 ml LV vol d, MOD A4C: 123.0 ml LV vol s, MOD A2C: 71.1 ml LV vol s, MOD A4C: 81.0 ml LV SV MOD A2C:     11.1 ml LV SV MOD A4C:     123.0 ml LV SV MOD BP:      29.2 ml LEFT ATRIUM             Index       RIGHT ATRIUM           Index LA diam:        3.60 cm 2.44 cm/m  RA Area:     12.60 cm LA Vol (A2C):   90.8 ml 61.42 ml/m RA Volume:   30.10 ml  20.36 ml/m LA Vol (A4C):   46.4 ml 31.39 ml/m LA Biplane Vol: 70.3 ml 47.56 ml/m  AORTIC VALVE                   PULMONIC VALVE AV Area (Vmax):    2.04 cm    PV Vmax:        0.53 m/s AV Area (Vmean):   2.28 cm    PV Peak grad:   1.1 mmHg AV Area (VTI):     2.98 cm    RVOT Peak grad: 1 mmHg AV Vmax:           73.20 cm/s AV Vmean:          44.700 cm/s AV VTI:            0.119 m AV Peak Grad:      2.1 mmHg AV Mean Grad:      1.0 mmHg LVOT Vmax:  47.60 cm/s LVOT Vmean:        32.400 cm/s LVOT VTI:          0.113 m LVOT/AV VTI ratio: 0.95  AORTA Ao Root diam: 3.10 cm MITRAL VALVE MV Area (PHT): 6.27 cm     SHUNTS MV Decel Time: 121 msec    Systemic VTI:  0.11 m MV E velocity: 84.00 cm/s  Systemic Diam: 2.00 cm Bartholome Bill MD Electronically signed by Bartholome Bill MD Signature Date/Time: 07/25/2020/11:02:59 AM    Final      Labs:   Basic Metabolic Panel: Recent Labs  Lab 07/25/20 0411 07/25/20 1529 07/26/20 1751 07/27/20 0558 07/27/20 1754 07/28/20 0704  NA 140  --  137 139 139 137  K 2.9*   < > 3.8 3.2* 4.1 4.3  CL 105  --  100 100 102 100  CO2 27  --  27 28 30 30   GLUCOSE 96  --  95 103* 106* 104*  BUN 7*  --  17 21 17 20   CREATININE 0.49  --  0.76 0.68 0.79 0.63  CALCIUM 8.9  --  8.7* 8.7* 9.2 9.0  MG 1.8  --  2.0  --   --  1.9  PHOS 4.7*  --  4.4  --   --   --    < > = values in this interval not displayed.   GFR Estimated Creatinine Clearance: 42.3 mL/min (by C-G formula based on SCr of 0.63 mg/dL). Liver Function Tests: Recent Labs  Lab 07/26/20 0621 07/27/20 0558 07/28/20 0704  AST 20 20 22   ALT 11 11 13   ALKPHOS 56 63 66  BILITOT 1.3* 1.0 1.3*  PROT 6.2* 6.6 7.1  ALBUMIN 3.2* 3.2* 3.4*   No results for input(s): LIPASE, AMYLASE in the last 168 hours. No results for input(s): AMMONIA in the last 168 hours. Coagulation profile No results for input(s): INR, PROTIME in the last 168 hours.  CBC: Recent Labs  Lab 07/24/20 1545 07/25/20 0411 07/26/20 0621 07/27/20 0558 07/28/20 0704  WBC 4.6 4.8 5.5 4.3 4.8  HGB 12.6 13.2 11.8* 11.6* 12.4  HCT 37.7 39.3 35.9* 35.1* 37.5  MCV 93.8 94.2 94.5 97.0 95.2  PLT 191 191 172 157 187   Cardiac Enzymes: No results for input(s): CKTOTAL, CKMB, CKMBINDEX, TROPONINI in the last 168 hours. BNP: Invalid input(s): POCBNP CBG: Recent Labs  Lab 07/25/20 0017  GLUCAP 119*   D-Dimer Recent Labs    07/27/20 0558 07/28/20 0704  DDIMER 1.06* 1.33*   Hgb A1c No results for input(s): HGBA1C in the last 72 hours. Lipid Profile No results for input(s): CHOL, HDL, LDLCALC, TRIG, CHOLHDL, LDLDIRECT in the last 72  hours. Thyroid function studies No results for input(s): TSH, T4TOTAL, T3FREE, THYROIDAB in the last 72 hours.  Invalid input(s): FREET3 Anemia work up No results for input(s): VITAMINB12, FOLATE, FERRITIN, TIBC, IRON, RETICCTPCT in the last 72 hours. Microbiology Recent Results (from the past 240 hour(s))  Resp Panel by RT-PCR (Flu A&B, Covid) Nasopharyngeal Swab     Status: Abnormal   Collection Time: 07/24/20 10:45 PM   Specimen: Nasopharyngeal Swab; Nasopharyngeal(NP) swabs in vial transport medium  Result Value Ref Range Status   SARS Coronavirus 2 by RT PCR POSITIVE (A) NEGATIVE Final    Comment: RESULT CALLED TO, READ BACK BY AND VERIFIED WITH: Meliton Rattan @0012  on 07/25/20 skl (NOTE) SARS-CoV-2 target nucleic acids are DETECTED.  The SARS-CoV-2 RNA is generally detectable in upper respiratory specimens during  the acute phase of infection. Positive results are indicative of the presence of the identified virus, but do not rule out bacterial infection or co-infection with other pathogens not detected by the test. Clinical correlation with patient history and other diagnostic information is necessary to determine patient infection status. The expected result is Negative.  Fact Sheet for Patients: EntrepreneurPulse.com.au  Fact Sheet for Healthcare Providers: IncredibleEmployment.be  This test is not yet approved or cleared by the [place] FDA and  has been authorized for detection and/or diagnosis of SARS-CoV-2 by FDA under an Emergency Use Authorization (EUA).  This EUA will remain in effect (meaning this test can b e used) for the duration of  the COVID-19 declaration under Section 564(b)(1) of the Act, 21 U.S.C. section 360bbb-3(b)(1), unless the authorization is terminated or revoked sooner.     Influenza A by PCR NEGATIVE NEGATIVE Final   Influenza B by PCR NEGATIVE NEGATIVE Final    Comment: (NOTE) The Xpert Xpress  SARS-CoV-2/FLU/RSV plus assay is intended as an aid in the diagnosis of influenza from Nasopharyngeal swab specimens and should not be used as a sole basis for treatment. Nasal washings and aspirates are unacceptable for Xpert Xpress SARS-CoV-2/FLU/RSV testing.  Fact Sheet for Patients: EntrepreneurPulse.com.au  Fact Sheet for Healthcare Providers: IncredibleEmployment.be  This test is not yet approved or cleared by the [place] FDA and has been authorized for detection and/or diagnosis of SARS-CoV-2 by FDA under an Emergency Use Authorization (EUA). This EUA will remain in effect (meaning this test can be used) for the duration of the COVID-19 declaration under Section 564(b)(1) of the Act, 21 U.S.C. section 360bbb-3(b)(1), unless the authorization is terminated or revoked.  Performed at [facility], [address] [place], [place] [address]   MRSA Next Gen by PCR, Nasal     Status: None   Collection Time: 07/25/20 12:57 AM   Specimen: Nasal Mucosa; Nasal Swab  Result Value Ref Range Status   MRSA by PCR Next Gen NOT DETECTED NOT DETECTED Final    Comment: (NOTE) The GeneXpert MRSA Assay (FDA approved for NASAL specimens only), is one component of a comprehensive MRSA colonization surveillance program. It is not intended to diagnose MRSA infection nor to guide or monitor treatment for MRSA infections. Test performance is not FDA approved in patients less than 51 years old. Performed at [facility], [address] [place],  [address]      Signed: Terrilee Croak  [facility] 07/28/2020, 2:52 PM

## 2020-07-31 ENCOUNTER — Telehealth: Payer: Self-pay | Admitting: Physician Assistant

## 2020-07-31 NOTE — Telephone Encounter (Signed)
Spoke with the patients daughter Helene Kelp.  Rush Barer that we do not manage or right orders for O2. Patient was rounded on by pulmonology during her recent admission but does not see them outpatient. Review of the patients recent hospital d/c, she was d/c on room air. O2 was prescribed after the patient April 2022 hospitalization.  Helene Kelp sts that the patient has O2 tanks but has not used them at home. Suggested that Helene Kelp contact the patients pcp to discuss the need/use of O2. Also provided the telephone number to the DME supplier listed on the pt April d/c summary.  Helene Kelp voiced appreciation for the assistance.

## 2020-07-31 NOTE — Telephone Encounter (Signed)
Patient's daughter is calling to ask if patient should be on oxygen at night. States patient was discharged from the hospital this past Friday. Please call to discuss.

## 2020-08-14 NOTE — Progress Notes (Signed)
Patient ID: [name], female    DOB: 1940/08/13, 80 y.o.   MRN: [id number]  HPI  Ms Daily is a 80 y/o female with a history of breast cancer, HTN, PVD, hyperlipidemia, CAD, previous tobacco use and chronic heart failure.   Echo report from 07/25/20 reviewed and showed an EF of 20-25% along with moderate MR.   RHC/LHC done 07/27/20 showed: Ost LAD to Prox LAD lesion is 80% stenosed.   Ost Cx to Prox Cx lesion is 80% stenosed.   Ost RCA to Prox RCA lesion is 60% stenosed.   Mid RCA lesion is 100% stenosed.   Ost LM to Dist LM lesion is 90% stenosed.   SVG graft was visualized by angiography and is normal in caliber.   SVG graft was visualized by angiography and is normal in caliber.   LIMA graft was visualized by non-selective angiography and is normal in caliber.   The graft exhibits no disease.   The graft exhibits no disease.   The graft exhibits no disease.   1.  Severe underlying left main and three-vessel coronary artery disease with patent grafts including LIMA to LAD, SVG to OM1 and SVG to right PDA/OM. 2.  Left ventricular angiography was not performed.  EF was severely reduced by echo. 3.  Significant peripheral arterial disease with heavy calcified vessels.  There is evidence of moderate to severe disease affecting the right common femoral artery. 4.  Right heart catheterization showed moderately elevated filling pressures, mild pulmonary hypertension and mildly reduced cardiac output.  Admitted 07/24/20 due to HF exacerbation. Initially given IV lasix with transition to oral diuretics. Cardiology and ID consults obtained. Covid +. Cath completed. Discharged after 4 days.   She presents today for her initial visit with a chief complaint of minimal fatigue upon moderate exertion. She describes this as having been present for several months. She has associated right foot swelling and chronic difficulty sleeping along with this. She denies any dizziness, abdominal distention,  palpitations, chest pain, shortness of breath, cough or weight gain.   Has upcoming echo scheduled on 09/22/20.  Past Medical History:  Diagnosis Date   Arthritis    Breast cancer ([place]) 2011   radiation- Left   CHF (congestive heart failure) (HCC)    Hyperlipidemia    Hypertension    Personal history of radiation therapy    PVD (peripheral vascular disease) ([place])    Past Surgical History:  Procedure Laterality Date   ABDOMINAL HYSTERECTOMY     BREAST BIOPSY Left 12/21/2009   positive, radiation dcis   BREAST EXCISIONAL BIOPSY Left 01/17/2010   lumpectomy DCIS   BREAST LUMPECTOMY     CATARACT EXTRACTION W/ INTRAOCULAR LENS IMPLANT & ANTERIOR VITRECTOMY, BILATERAL     CHOLECYSTECTOMY     COLONOSCOPY WITH PROPOFOL N/A 09/25/2017   Procedure: COLONOSCOPY WITH PROPOFOL;  Surgeon: Lollie Sails, MD;  Location: [facility] ENDOSCOPY;  Service: Endoscopy;  Laterality: N/A;   CORONARY ARTERY BYPASS GRAFT N/A 05/03/2020   Procedure: CORONARY ARTERY BYPASS GRAFTING (CABG) X FOUR ON PUMP USING LEFT INTERNAL MAMMARY ARTERY AND RIGHT ENDOSCOPIC SAPHEOUS VEIN HARVEST CONDUITS;  Surgeon: Melrose Nakayama, MD;  Location: [place];  Service: Open Heart Surgery;  Laterality: N/A;   DILATION AND CURETTAGE OF UTERUS     JOINT REPLACEMENT Right 01/24/2016   KNEE ARTHROPLASTY Right 01/24/2016   Procedure: COMPUTER ASSISTED TOTAL KNEE ARTHROPLASTY;  Surgeon: Dereck Leep, MD;  Location: [facility] ORS;  Service: Orthopedics;  Laterality: Right;  LEFT HEART CATH AND CORONARY ANGIOGRAPHY N/A 05/01/2020   Procedure: LEFT HEART CATH AND CORONARY ANGIOGRAPHY;  Surgeon: Wellington Hampshire, MD;  Location: [place] CV [facility];  Service: Cardiovascular;  Laterality: N/A;   RIGHT/LEFT HEART CATH AND CORONARY/GRAFT ANGIOGRAPHY N/A 07/27/2020   Procedure: RIGHT/LEFT HEART CATH AND CORONARY/GRAFT ANGIOGRAPHY;  Surgeon: Wellington Hampshire, MD;  Location: [place] [facility];  Service: Cardiovascular;  Laterality: N/A;   stent  in Lt leg Left    TEE WITHOUT CARDIOVERSION N/A 05/03/2020   Procedure: TRANSESOPHAGEAL ECHOCARDIOGRAM (TEE);  Surgeon: Melrose Nakayama, MD;  Location: [place];  Service: Open Heart Surgery;  Laterality: N/A;   Family History  Problem Relation Age of Onset   Hypertension Mother    Arthritis Mother    Gout Mother    Lung cancer Father    Heart attack Sister    Social History   Tobacco Use   Smoking status: Former   Smokeless tobacco: Never  Substance Use Topics   Alcohol use: No    Alcohol/week: 0.0 standard drinks   Allergies  Allergen Reactions   Ibuprofen Hives   Penicillins Hives    Tolerated Ancef in the past   Aleve [Naproxen Sodium] Hives   Avapro  [Irbesartan] Rash    Other reaction(s): Weal   Azithromycin Hives   Nickel Itching    Redness, itching and fluid discharge with nickel earrings   Other Other (See Comments)    Nickel"ears ran fluid and itching" redness and "sensitive" Nickel"ears ran fluid and itching"   Prior to Admission medications   Medication Sig Start Date End Date Taking? Authorizing Provider  acetaminophen (TYLENOL) 500 MG tablet Take 1,000 mg by mouth every 6 (six) hours as needed for mild pain.   Yes [provider]  aspirin EC 81 MG tablet Take 81 mg by mouth daily.   Yes [provider]  atorvastatin (LIPITOR) 20 MG tablet Take 1 tablet (20 mg total) by mouth at bedtime. 06/22/20  Yes Visser, Jacquelyn D, PA-C  Calcium Carbonate-Vit D-Min (CALTRATE 600+D PLUS PO) Take 1 tablet by mouth daily.    Yes [provider]  clopidogrel (PLAVIX) 75 MG tablet Take 1 tablet (75 mg total) by mouth daily. 05/22/20 05/17/21 Yes Visser, Jacquelyn D, PA-C  furosemide (LASIX) 40 MG tablet Take 1 tablet (40 mg total) by mouth daily. 07/29/20 08/28/20 Yes Dahal, Marlowe Aschoff, MD  metoprolol succinate (TOPROL-XL) 50 MG 24 hr tablet Take 1 tablet (50 mg total) by mouth daily. Take with or immediately following a meal. 07/28/20 08/27/20 Yes Dahal,  Marlowe Aschoff, MD  nitroGLYCERIN (NITROSTAT) 0.4 MG SL tablet Place 1 tablet (0.4 mg total) under the tongue every 5 (five) minutes as needed for up to 10 days for chest pain. 07/28/20  Yes Dahal, Marlowe Aschoff, MD  potassium chloride SA (KLOR-CON) 10 MEQ tablet Take 1 tablet (10 mEq total) by mouth daily. 07/28/20 08/27/20 Yes Dahal, Marlowe Aschoff, MD  sacubitril-valsartan (ENTRESTO) 24-26 MG Take 1 tablet by mouth 2 (two) times daily. 07/28/20 08/27/20 Yes Dahal, Marlowe Aschoff, MD   Review of Systems  Constitutional:  Positive for fatigue (minimal). Negative for appetite change.  HENT:  Negative for congestion, postnasal drip and sore throat.   Eyes: Negative.   Respiratory:  Negative for cough, chest tightness and shortness of breath.   Cardiovascular:  Positive for leg swelling (right foot; resolves overnight). Negative for chest pain and palpitations.  Gastrointestinal:  Negative for abdominal distention and abdominal pain.  Endocrine: Negative.  Genitourinary: Negative.   Musculoskeletal:  Negative for back pain and neck pain.  Skin: Negative.   Allergic/Immunologic: Negative.   Neurological:  Negative for dizziness and light-headedness.  Hematological:  Negative for adenopathy. Does not bruise/bleed easily.  Psychiatric/Behavioral:  Positive for sleep disturbance (difficulty sleeping; sleeping in recliner due to comfort). Negative for dysphoric mood. The patient is not nervous/anxious.    Vitals:   08/15/20 1028  BP: 102/84  Pulse: 79  Resp: 14  SpO2: 100%  Weight: 113 lb 2 oz (51.3 kg)  Height: 5' (1.524 m)   Wt Readings from Last 3 Encounters:  08/15/20 113 lb 2 oz (51.3 kg)  07/28/20 105 lb 6.1 oz (47.8 kg)  06/27/20 117 lb (53.1 kg)   Lab Results  Component Value Date   CREATININE 0.63 07/28/2020   CREATININE 0.79 07/27/2020   CREATININE 0.68 07/27/2020     Physical Exam Vitals and nursing note reviewed. Exam conducted with a chaperone present (granddaughter).  Constitutional:       Appearance: Normal appearance.  HENT:     Head: Normocephalic and atraumatic.  Cardiovascular:     Rate and Rhythm: Normal rate and regular rhythm.  Pulmonary:     Effort: Pulmonary effort is normal. No respiratory distress.     Breath sounds: No wheezing or rales.  Abdominal:     General: There is no distension.     Palpations: Abdomen is soft.  Musculoskeletal:        General: No tenderness.     Cervical back: Normal range of motion and neck supple.     Right lower leg: Edema present.     Left lower leg: No edema.  Skin:    General: Skin is warm and dry.  Neurological:     General: No focal deficit present.     Mental Status: She is alert and oriented to person, place, and time.  Psychiatric:        Mood and Affect: Mood normal.        Behavior: Behavior normal.        Thought Content: Thought content normal.     Assessment & Plan:  1: Chronic heart failure with reduced ejection fraction- - NYHA class II - euvolemic today - weighing daily; instructed to call for an overnight weight gain of > 2 pounds or a weekly weight gain of > 5 pounds - not adding salt, although admits she would like to; has been reading food labels for sodium content; reviewed the importance of keeping her daily sodium intake to 1500-2000mg / day and low sodium cookbook was given to her - did eat at [employer] recently and got chicken biscuit because she thought it would be lower in sodium and when they looked up the information afterwards, she was shocked there was > 900mg  in it - on GDMT of metoprolol and entresto - current BP will not allow for titration - discussed using spironolactone (doubt could be currently used due to BP) or SGLT; if start farxiga, could decrease furosemide - will hold off med adjustments pending echo that is scheduled next month - encouraged her to elevate her legs when sitting for long periods of time; she says the swelling in her right food goes down completely overnight; has  had vein removed in her right leg previously - BNP 07/24/20 was 745.6  2: HTN- - BP looks good although on the low side (102/84) - saw PCP Baldemar Lenis) 08/01/20 - BMP 07/28/20 reviewed and showed sodium 137, potassium 4.3,  creatinine 0.63 and GFR > 60  3: CAD- - CABG 04/2020 - saw cardiology Mickle Plumb) 06/22/20   Patient did not bring her medications nor a list. Each medication was verbally reviewed with the patient and she was encouraged to bring the bottles to every visit to confirm accuracy of list.   Return in 2 months or sooner for any questions/problems before then.

## 2020-08-15 ENCOUNTER — Encounter: Payer: Self-pay | Admitting: Family

## 2020-08-15 ENCOUNTER — Ambulatory Visit: Payer: Medicare Other | Attending: Family | Admitting: Family

## 2020-08-15 ENCOUNTER — Other Ambulatory Visit: Payer: Self-pay

## 2020-08-15 VITALS — BP 102/84 | HR 79 | Resp 14 | Ht 60.0 in | Wt 113.1 lb

## 2020-08-15 DIAGNOSIS — Z87891 Personal history of nicotine dependence: Secondary | ICD-10-CM | POA: Diagnosis not present

## 2020-08-15 DIAGNOSIS — I739 Peripheral vascular disease, unspecified: Secondary | ICD-10-CM | POA: Insufficient documentation

## 2020-08-15 DIAGNOSIS — Z951 Presence of aortocoronary bypass graft: Secondary | ICD-10-CM

## 2020-08-15 DIAGNOSIS — Z7982 Long term (current) use of aspirin: Secondary | ICD-10-CM | POA: Diagnosis not present

## 2020-08-15 DIAGNOSIS — R5383 Other fatigue: Secondary | ICD-10-CM | POA: Insufficient documentation

## 2020-08-15 DIAGNOSIS — I272 Pulmonary hypertension, unspecified: Secondary | ICD-10-CM | POA: Diagnosis not present

## 2020-08-15 DIAGNOSIS — Z7902 Long term (current) use of antithrombotics/antiplatelets: Secondary | ICD-10-CM | POA: Insufficient documentation

## 2020-08-15 DIAGNOSIS — I2510 Atherosclerotic heart disease of native coronary artery without angina pectoris: Secondary | ICD-10-CM | POA: Diagnosis not present

## 2020-08-15 DIAGNOSIS — Z8616 Personal history of COVID-19: Secondary | ICD-10-CM | POA: Insufficient documentation

## 2020-08-15 DIAGNOSIS — Z79899 Other long term (current) drug therapy: Secondary | ICD-10-CM | POA: Diagnosis not present

## 2020-08-15 DIAGNOSIS — M7989 Other specified soft tissue disorders: Secondary | ICD-10-CM | POA: Insufficient documentation

## 2020-08-15 DIAGNOSIS — Z8249 Family history of ischemic heart disease and other diseases of the circulatory system: Secondary | ICD-10-CM | POA: Diagnosis not present

## 2020-08-15 DIAGNOSIS — I5022 Chronic systolic (congestive) heart failure: Secondary | ICD-10-CM | POA: Insufficient documentation

## 2020-08-15 DIAGNOSIS — Z7901 Long term (current) use of anticoagulants: Secondary | ICD-10-CM | POA: Diagnosis not present

## 2020-08-15 DIAGNOSIS — G479 Sleep disorder, unspecified: Secondary | ICD-10-CM | POA: Diagnosis not present

## 2020-08-15 DIAGNOSIS — I110 Hypertensive heart disease with heart failure: Secondary | ICD-10-CM | POA: Insufficient documentation

## 2020-08-15 DIAGNOSIS — I5020 Unspecified systolic (congestive) heart failure: Secondary | ICD-10-CM

## 2020-08-15 DIAGNOSIS — E785 Hyperlipidemia, unspecified: Secondary | ICD-10-CM | POA: Diagnosis not present

## 2020-08-15 DIAGNOSIS — I10 Essential (primary) hypertension: Secondary | ICD-10-CM

## 2020-08-15 NOTE — Patient Instructions (Addendum)
Continue weighing daily and call for an overnight weight gain of > 2 pounds or a weekly weight gain of >5  pounds.  Low-Sodium Eating Plan Sodium, which is an element that makes up salt, helps you maintain a healthy balance of fluids in your body. Too much sodium can increase your bloodpressure and cause fluid and waste to be held in your body. Your health care provider or dietitian may recommend following this plan if you have high blood pressure (hypertension), kidney disease, liver disease, or heart failure. Eating less sodium can help lower your blood pressure, reduce swelling, and protect your heart, liver, andkidneys. What are tips for following this plan? Reading food labels The Nutrition Facts label lists the amount of sodium in one serving of the food. If you eat more than one serving, you must multiply the listed amount of sodium by the number of servings. Choose foods with less than 140 mg of sodium per serving. Avoid foods with 300 mg of sodium or more per serving. Shopping  Look for lower-sodium products, often labeled as "low-sodium" or "no salt added." Always check the sodium content, even if foods are labeled as "unsalted" or "no salt added." Buy fresh foods. Avoid canned foods and pre-made or frozen meals. Avoid canned, cured, or processed meats. Buy breads that have less than 80 mg of sodium per slice.  Cooking  Eat more home-cooked food and less restaurant, buffet, and fast food. Avoid adding salt when cooking. Use salt-free seasonings or herbs instead of table salt or sea salt. Check with your health care provider or pharmacist before using salt substitutes. Cook with plant-based oils, such as canola, sunflower, or olive oil.  Meal planning When eating at a restaurant, ask that your food be prepared with less salt or no salt, if possible. Avoid dishes labeled as brined, pickled, cured, smoked, or made with soy sauce, miso, or teriyaki sauce. Avoid foods that contain  MSG (monosodium glutamate). MSG is sometimes added to [place] food, bouillon, and some canned foods. Make meals that can be grilled, baked, poached, roasted, or steamed. These are generally made with less sodium. General information Most people on this plan should limit their sodium intake to 1,500-2,000 mg (milligrams) of sodium each day. What foods should I eat? Fruits Fresh, frozen, or canned fruit. Fruit juice. Vegetables Fresh or frozen vegetables. "No salt added" canned vegetables. "No salt added"tomato sauce and paste. Low-sodium or reduced-sodium tomato and vegetable juice. Grains Low-sodium cereals, including oats, puffed wheat and rice, and shredded wheat. Low-sodium crackers. Unsalted rice. Unsalted pasta. Low-sodium bread.Whole-grain breads and whole-grain pasta. Meats and other proteins Fresh or frozen (no salt added) meat, poultry, seafood, and fish. Low-sodium canned tuna and salmon. Unsalted nuts. Dried peas, beans, and lentils withoutadded salt. Unsalted canned beans. Eggs. Unsalted nut butters. Dairy Milk. Soy milk. Cheese that is naturally low in sodium, such as ricotta cheese, fresh mozzarella, or Swiss cheese. Low-sodium or reduced-sodium cheese. Creamcheese. Yogurt. Seasonings and condiments Fresh and dried herbs and spices. Salt-free seasonings. Low-sodium mustard and ketchup. Sodium-free salad dressing. Sodium-free light mayonnaise. Fresh orrefrigerated horseradish. Lemon juice. Vinegar. Other foods Homemade, reduced-sodium, or low-sodium soups. Unsalted popcorn and pretzels.Low-salt or salt-free chips. The items listed above may not be a complete list of foods and beverages you can eat. Contact a dietitian for more information. What foods should I avoid? Vegetables Sauerkraut, pickled vegetables, and relishes. Olives. [place] fries. Onion rings. Regular canned vegetables (not low-sodium or reduced-sodium). Regular canned tomato sauce and paste (not  low-sodium or  reduced-sodium). Regular tomato and vegetable juice (not low-sodium or reduced-sodium). Frozenvegetables in sauces. Grains Instant hot cereals. Bread stuffing, pancake, and biscuit mixes. Croutons. Seasoned rice or pasta mixes. Noodle soup cups. Boxed or frozen macaroni andcheese. Regular salted crackers. Self-rising flour. Meats and other proteins Meat or fish that is salted, canned, smoked, spiced, or pickled. Precooked or cured meat, such as sausages or meat loaves. Berniece Salines. Ham. Pepperoni. Hot dogs. Corned beef. Chipped beef. Salt pork. Jerky. Pickled herring. Anchovies andsardines. Regular canned tuna. Salted nuts. Dairy Processed cheese and cheese spreads. Hard cheeses. Cheese curds. Blue cheese.Feta cheese. String cheese. Regular cottage cheese. Buttermilk. Canned milk. Fats and oils Salted butter. Regular margarine. Ghee. Bacon fat. Seasonings and condiments Onion salt, garlic salt, seasoned salt, table salt, and sea salt. Canned and packaged gravies. Worcestershire sauce. Tartar sauce. Barbecue sauce. Teriyaki sauce. Soy sauce, including reduced-sodium. Steak sauce. Fish sauce. Oyster sauce. Cocktail sauce. Horseradish that you find on the shelf. Regular ketchup and mustard. Meat flavorings and tenderizers. Bouillon cubes. Hot sauce. Pre-made or packaged marinades. Pre-made or packaged taco seasonings. Relishes.Regular salad dressings. Salsa. Other foods Salted popcorn and pretzels. Corn chips and puffs. Potato and tortilla chips.Canned or dried soups. Pizza. Frozen entrees and pot pies. The items listed above may not be a complete list of foods and beverages you should avoid. Contact a dietitian for more information. Summary Eating less sodium can help lower your blood pressure, reduce swelling, and protect your heart, liver, and kidneys. Most people on this plan should limit their sodium intake to 1,500-2,000 mg (milligrams) of sodium each day. Canned, boxed, and frozen foods are high in  sodium. Restaurant foods, fast foods, and pizza are also very high in sodium. You also get sodium by adding salt to food. Try to cook at home, eat more fresh fruits and vegetables, and eat less fast food and canned, processed, or prepared foods. This information is not intended to replace advice given to you by your health care provider. Make sure you discuss any questions you have with your healthcare provider. Document Revised: 02/05/2019 Document Reviewed: 12/02/2018 Elsevier Patient Education  2022 [employer].

## 2020-09-05 ENCOUNTER — Ambulatory Visit (INDEPENDENT_AMBULATORY_CARE_PROVIDER_SITE_OTHER): Payer: Medicare Other | Admitting: Thoracic Surgery (Cardiothoracic Vascular Surgery)

## 2020-09-05 ENCOUNTER — Ambulatory Visit
Admission: RE | Admit: 2020-09-05 | Discharge: 2020-09-05 | Disposition: A | Discharge status: Home / Self Care | Payer: Medicare Other | Source: Ambulatory Visit | Attending: Thoracic Surgery (Cardiothoracic Vascular Surgery) | Admitting: Thoracic Surgery (Cardiothoracic Vascular Surgery)

## 2020-09-05 ENCOUNTER — Other Ambulatory Visit: Payer: Self-pay | Admitting: *Deleted

## 2020-09-05 ENCOUNTER — Other Ambulatory Visit: Payer: Self-pay

## 2020-09-05 ENCOUNTER — Encounter: Payer: Self-pay | Admitting: *Deleted

## 2020-09-05 VITALS — BP 116/68 | HR 94 | Resp 20 | Ht 60.0 in | Wt 112.0 lb

## 2020-09-05 DIAGNOSIS — Z951 Presence of aortocoronary bypass graft: Secondary | ICD-10-CM

## 2020-09-05 DIAGNOSIS — I6523 Occlusion and stenosis of bilateral carotid arteries: Secondary | ICD-10-CM

## 2020-09-05 DIAGNOSIS — J984 Other disorders of lung: Secondary | ICD-10-CM

## 2020-09-05 DIAGNOSIS — R918 Other nonspecific abnormal finding of lung field: Secondary | ICD-10-CM

## 2020-09-05 DIAGNOSIS — I509 Heart failure, unspecified: Secondary | ICD-10-CM

## 2020-09-05 NOTE — Progress Notes (Signed)
[address]       [place] [address]             [phone]     HPI: [name] returns for a scheduled follow-up regarding a left lower lung mass.  [name] is a 80 year old woman with a history of hypertension, hyperlipidemia, PAD, DCIS of the breast, remote tobacco abuse, non-ST elevation MI, left main and three-vessel CAD, and coronary bypass grafting x4 on 05/03/2020.  While in the hospital she had an abnormal chest x-ray.  His CT showed a an unusual appearing left lower lobe lung mass.  She also had some calcified mediastinal lymph nodes.  She returned for a follow-up visit on 05/30/2020.  The lesion appeared smaller and cavitary on chest x-ray.  She now returns for follow-up of that with a repeat CT.  In the interim since her last visit she was hospitalized at [facility] with congestive heart failure.  She had catheterization which showed her grafts were patent.  Ejection fraction on admission was 25% down from 40%.  Her heart failure symptoms resolved with optimization of medical therapy.   Past Medical History:  Diagnosis Date   Arthritis    Breast cancer ([place]) 2011   radiation- Left   CHF (congestive heart failure) (HCC)    Hyperlipidemia    Hypertension    Personal history of radiation therapy    PVD (peripheral vascular disease) ([facility])     Current Outpatient Medications  Medication Sig Dispense Refill   acetaminophen (TYLENOL) 500 MG tablet Take 1,000 mg by mouth every 6 (six) hours as needed for mild pain.     aspirin EC 81 MG tablet Take 81 mg by mouth daily.     atorvastatin (LIPITOR) 20 MG tablet Take 1 tablet (20 mg total) by mouth at bedtime. 90 tablet 3   Calcium Carbonate-Vit D-Min (CALTRATE 600+D PLUS PO) Take 1 tablet by mouth daily.      clopidogrel (PLAVIX) 75 MG tablet Take 1 tablet (75 mg total) by mouth daily. 90 tablet 3   furosemide (LASIX) 40 MG tablet Take 1 tablet (40 mg total) by mouth daily. 30 tablet 0   metoprolol  succinate (TOPROL-XL) 50 MG 24 hr tablet Take 1 tablet (50 mg total) by mouth daily. Take with or immediately following a meal. 30 tablet 0   nitroGLYCERIN (NITROSTAT) 0.4 MG SL tablet Place 1 tablet (0.4 mg total) under the tongue every 5 (five) minutes as needed for up to 10 days for chest pain. 10 tablet 0   potassium chloride SA (KLOR-CON) 10 MEQ tablet Take 1 tablet (10 mEq total) by mouth daily. 30 tablet 0   sacubitril-valsartan (ENTRESTO) 24-26 MG Take 1 tablet by mouth 2 (two) times daily. 60 tablet 0   No current facility-administered medications for this visit.    Physical Exam BP 116/68   Pulse 94   Resp 20   Ht 5' (1.524 m)   Wt 112 lb (50.8 kg)   SpO2 97% Comment: RA  BMI 21.10 kg/m  80 year old woman in no acute distress Alert and oriented x3 with no focal deficits No cervical or supraclavicular adenopathy Cardiac regular rate and rhythm Lungs clear bilaterally  Diagnostic Tests: I personally reviewed the CT images.  The cavitary left lower lobe lung mass is still present.  Not significantly changed.  Calcified mediastinal lymph nodes.  Impression: [name] is a 80 year old woman with a history of hypertension, hyperlipidemia, PAD, DCIS of the  breast, remote tobacco abuse, non-ST elevation MI, left main and three-vessel CAD, coronary bypass grafting x4 on 05/03/2020, and a cavitary left lower lobe lung mass.  CAD, status post coronary bypass grafting x4-now about 4 months out from surgery.  Was readmitted about a month ago with congestive heart failure.  Catheterization showed the grafts were patent.  Left lower lobe lung mass-large cavitary posterior left lower lobe lung mass.  Differential diagnosis includes primary bronchogenic carcinoma versus granulomatous disease.  She does have some calcified mediastinal lymph nodes so has had granulomatous disease at some point..  However, we cannot rule out the possibility of primary bronchogenic carcinoma in this case.  I  recommended her that we proceed with further investigation of the lung mass.  We will plan to do a PET/CT to guide our initial diagnostic work-up.  Although it may change based on the results of the PET CT the plan then would be to proceed with electromagnetic navigational bronchoscopy for biopsies and cultures.  I described the proposed procedure to the patient and her son.  They understand this will be done in the operating room under general anesthesia.  It is an endoscopic procedure and no incisions are involved.  We discussed the indications, risks, benefits, and alternatives.  They understand the risks include, but are not limited to death, MI, DVT, PE, bleeding, pneumothorax, failure to make a diagnosis, as well as possibility of other unforeseeable complications.  She accepts the risks and agrees to proceed.  Plan: PET/CT to guide initial diagnostic work-up Unless alternative suggested by PET/CT would plan electromagnetic navigational bronchoscopy for biopsies and cultures on Friday, 09/22/2020  Melrose Nakayama, MD [facility] [phone]

## 2020-09-05 NOTE — Patient Instructions (Signed)
Hold Plavix (clopidogrel) for 5 days prior to bronchoscopy

## 2020-09-05 NOTE — H&P (View-Only) (Signed)
[address]       [place] [address]             [phone]     HPI: [name] returns for a scheduled follow-up regarding a left lower lung mass.  [name] is a 80 year old woman with a history of hypertension, hyperlipidemia, PAD, DCIS of the breast, remote tobacco abuse, non-ST elevation MI, left main and three-vessel CAD, and coronary bypass grafting x4 on 05/03/2020.  While in the hospital she had an abnormal chest x-ray.  His CT showed a an unusual appearing left lower lobe lung mass.  She also had some calcified mediastinal lymph nodes.  She returned for a follow-up visit on 05/30/2020.  The lesion appeared smaller and cavitary on chest x-ray.  She now returns for follow-up of that with a repeat CT.  In the interim since her last visit she was hospitalized at [facility] with congestive heart failure.  She had catheterization which showed her grafts were patent.  Ejection fraction on admission was 25% down from 40%.  Her heart failure symptoms resolved with optimization of medical therapy.   Past Medical History:  Diagnosis Date   Arthritis    Breast cancer ([place]) 2011   radiation- Left   CHF (congestive heart failure) (HCC)    Hyperlipidemia    Hypertension    Personal history of radiation therapy    PVD (peripheral vascular disease) (HCC)     Current Outpatient Medications  Medication Sig Dispense Refill   acetaminophen (TYLENOL) 500 MG tablet Take 1,000 mg by mouth every 6 (six) hours as needed for mild pain.     aspirin EC 81 MG tablet Take 81 mg by mouth daily.     atorvastatin (LIPITOR) 20 MG tablet Take 1 tablet (20 mg total) by mouth at bedtime. 90 tablet 3   Calcium Carbonate-Vit D-Min (CALTRATE 600+D PLUS PO) Take 1 tablet by mouth daily.      clopidogrel (PLAVIX) 75 MG tablet Take 1 tablet (75 mg total) by mouth daily. 90 tablet 3   furosemide (LASIX) 40 MG tablet Take 1 tablet (40 mg total) by mouth daily. 30 tablet 0   metoprolol  succinate (TOPROL-XL) 50 MG 24 hr tablet Take 1 tablet (50 mg total) by mouth daily. Take with or immediately following a meal. 30 tablet 0   nitroGLYCERIN (NITROSTAT) 0.4 MG SL tablet Place 1 tablet (0.4 mg total) under the tongue every 5 (five) minutes as needed for up to 10 days for chest pain. 10 tablet 0   potassium chloride SA (KLOR-CON) 10 MEQ tablet Take 1 tablet (10 mEq total) by mouth daily. 30 tablet 0   sacubitril-valsartan (ENTRESTO) 24-26 MG Take 1 tablet by mouth 2 (two) times daily. 60 tablet 0   No current facility-administered medications for this visit.    Physical Exam BP 116/68   Pulse 94   Resp 20   Ht 5' (1.524 m)   Wt 112 lb (50.8 kg)   SpO2 97% Comment: RA  BMI 21.15 kg/m  80 year old woman in no acute distress Alert and oriented x3 with no focal deficits No cervical or supraclavicular adenopathy Cardiac regular rate and rhythm Lungs clear bilaterally  Diagnostic Tests: I personally reviewed the CT images.  The cavitary left lower lobe lung mass is still present.  Not significantly changed.  Calcified mediastinal lymph nodes.  Impression: [name] is a 80 year old woman with a history of hypertension, hyperlipidemia, PAD, DCIS of the  breast, remote tobacco abuse, non-ST elevation MI, left main and three-vessel CAD, coronary bypass grafting x4 on 05/03/2020, and a cavitary left lower lobe lung mass.  CAD, status post coronary bypass grafting x4-now about 4 months out from surgery.  Was readmitted about a month ago with congestive heart failure.  Catheterization showed the grafts were patent.  Left lower lobe lung mass-large cavitary posterior left lower lobe lung mass.  Differential diagnosis includes primary bronchogenic carcinoma versus granulomatous disease.  She does have some calcified mediastinal lymph nodes so has had granulomatous disease at some point..  However, we cannot rule out the possibility of primary bronchogenic carcinoma in this case.  I  recommended her that we proceed with further investigation of the lung mass.  We will plan to do a PET/CT to guide our initial diagnostic work-up.  Although it may change based on the results of the PET CT the plan then would be to proceed with electromagnetic navigational bronchoscopy for biopsies and cultures.  I described the proposed procedure to the patient and her son.  They understand this will be done in the operating room under general anesthesia.  It is an endoscopic procedure and no incisions are involved.  We discussed the indications, risks, benefits, and alternatives.  They understand the risks include, but are not limited to death, MI, DVT, PE, bleeding, pneumothorax, failure to make a diagnosis, as well as possibility of other unforeseeable complications.  She accepts the risks and agrees to proceed.  Plan: PET/CT to guide initial diagnostic work-up Unless alternative suggested by PET/CT would plan electromagnetic navigational bronchoscopy for biopsies and cultures on Friday, 09/22/2020  Melrose Nakayama, MD [facility] [phone]

## 2020-09-14 ENCOUNTER — Ambulatory Visit
Admission: RE | Admit: 2020-09-14 | Discharge: 2020-09-14 | Disposition: A | Discharge status: Home / Self Care | Payer: Medicare Other | Source: Ambulatory Visit | Attending: Thoracic Surgery (Cardiothoracic Vascular Surgery) | Admitting: Thoracic Surgery (Cardiothoracic Vascular Surgery)

## 2020-09-14 ENCOUNTER — Other Ambulatory Visit: Payer: Self-pay

## 2020-09-14 ENCOUNTER — Telehealth: Payer: Self-pay | Admitting: Cardiology

## 2020-09-14 DIAGNOSIS — Z87891 Personal history of nicotine dependence: Secondary | ICD-10-CM | POA: Insufficient documentation

## 2020-09-14 DIAGNOSIS — R918 Other nonspecific abnormal finding of lung field: Secondary | ICD-10-CM | POA: Insufficient documentation

## 2020-09-14 DIAGNOSIS — Z951 Presence of aortocoronary bypass graft: Secondary | ICD-10-CM | POA: Insufficient documentation

## 2020-09-14 LAB — GLUCOSE, CAPILLARY: Glucose-Capillary: 103 mg/dL — ABNORMAL HIGH (ref 70–99)

## 2020-09-14 MED ORDER — FLUDEOXYGLUCOSE F - 18 (FDG) INJECTION
5.9000 | Freq: Once | INTRAVENOUS | Status: AC | PRN
  Administered 2020-09-14: 6.02 via INTRAVENOUS

## 2020-09-14 NOTE — Telephone Encounter (Signed)
Please advise if tomorrow echo is still needed.

## 2020-09-14 NOTE — Telephone Encounter (Signed)
Cancelled.  

## 2020-09-15 ENCOUNTER — Other Ambulatory Visit: Payer: Medicare Other

## 2020-09-15 NOTE — Telephone Encounter (Signed)
Noted  

## 2020-09-19 ENCOUNTER — Encounter: Payer: Self-pay | Admitting: Family

## 2020-09-19 NOTE — Progress Notes (Signed)
Surgical Instructions    Your procedure is scheduled on Friday September 9th.  Report to [facility] Main Entrance "A" at 1120 A.M., then check in with the Admitting office.  Call this number if you have problems the morning of surgery:  [phone]   If you have any questions prior to your surgery date call [phone]: Open Monday-Friday 8am-4pm    Remember:  Do not eat or drink anything after midnight the night before your surgery     Take these medicines the morning of surgery with A SIP OF WATER metoprolol succinate (TOPROL-XL) 50 MG 24 hr tablet  IF NEEDED  nitroGLYCERIN (NITROSTAT) 0.4 MG SL tablet  Follow your surgeon's instructions on when to stop Aspirin and Plavix.  If no instructions were given by your surgeon then you will need to call the office to get those instructions.      As of today, STOP taking any Aspirin (unless otherwise instructed by your surgeon) Aleve, Naproxen, Ibuprofen, Motrin, Advil, Goody's, BC's, all herbal medications, fish oil, and all vitamins.          Do not wear jewelry  Do not wear lotions, powders, colognes, or deodorant. Do not shave 48 hours prior to surgery.  Men may shave face and neck. Do not bring valuables to the hospital. DO Not wear nail polish, gel polish, artificial nails, or any other type of covering on natural nails including finger and toenails. If patients have artificial nails, gel coating, etc. that need to be removed by a nail salon please have this removed prior to surgery or surgery may need to be canceled/delayed if the surgeon/ anesthesia feels like the patient is unable to be adequately monitored.             [facility] is not responsible for any belongings or valuables.  Do NOT Smoke (Tobacco/Vaping)  24 hours prior to your procedure If you use a CPAP at night, you may bring all equipment for your overnight stay.   Contacts, glasses, dentures or bridgework may not be worn into surgery, please bring cases for  these belongings   For patients admitted to the hospital, discharge time will be determined by your treatment team.   Patients discharged the day of surgery will not be allowed to drive home, and someone needs to stay with them for 24 hours.  ONLY 1 SUPPORT PERSON MAY BE PRESENT WHILE YOU ARE IN SURGERY. IF YOU ARE TO BE ADMITTED ONCE YOU ARE IN YOUR ROOM YOU WILL BE ALLOWED TWO (2) VISITORS.  Minor children may have two parents present. Special consideration for safety and communication needs will be reviewed on a case by case basis.  Special instructions:    Oral Hygiene is also important to reduce your risk of infection.  Remember - BRUSH YOUR TEETH THE MORNING OF SURGERY WITH YOUR REGULAR TOOTHPASTE   [place]- Preparing For Surgery  Before surgery, you can play an important role. Because skin is not sterile, your skin needs to be as free of germs as possible. You can reduce the number of germs on your skin by washing with CHG (chlorahexidine gluconate) Soap before surgery.  CHG is an antiseptic cleaner which kills germs and bonds with the skin to continue killing germs even after washing.     Please do not use if you have an allergy to CHG or antibacterial soaps. If your skin becomes reddened/irritated stop using the CHG.  Do not shave (including legs and underarms) for at least  48 hours prior to first CHG shower. It is OK to shave your face.  Please follow these instructions carefully.     Shower the NIGHT BEFORE SURGERY and the MORNING OF SURGERY with CHG Soap.   If you chose to wash your hair, wash your hair first as usual with your normal shampoo. After you shampoo, rinse your hair and body thoroughly to remove the shampoo.  Then [employer] and genitals (private parts) with your normal soap and rinse thoroughly to remove soap.  After that Use CHG Soap as you would any other liquid soap. You can apply CHG directly to the skin and wash gently with a scrungie or a clean washcloth.    Apply the CHG Soap to your body ONLY FROM THE NECK DOWN.  Do not use on open wounds or open sores. Avoid contact with your eyes, ears, mouth and genitals (private parts). Wash Face and genitals (private parts)  with your normal soap.   Wash thoroughly, paying special attention to the area where your surgery will be performed.  Thoroughly rinse your body with warm water from the neck down.  DO NOT shower/wash with your normal soap after using and rinsing off the CHG Soap.  Pat yourself dry with a CLEAN TOWEL.  Wear CLEAN PAJAMAS to bed the night before surgery  Place CLEAN SHEETS on your bed the night before your surgery  DO NOT SLEEP WITH PETS.   Day of Surgery:  Take a shower with CHG soap. Wear Clean/Comfortable clothing the morning of surgery Do not apply any deodorants/lotions.   Remember to brush your teeth WITH YOUR REGULAR TOOTHPASTE.   Please read over the following fact sheets that you were given.

## 2020-09-20 ENCOUNTER — Inpatient Hospital Stay (HOSPITAL_COMMUNITY): Admission: RE | Admit: 2020-09-20 | Payer: Medicare Other | Source: Ambulatory Visit

## 2020-09-20 NOTE — Progress Notes (Signed)
Surgical Instructions    Your procedure is scheduled on Monday, September 12th.  Report to [facility] Main Entrance "A" at 5:30 A.M., then check in with the Admitting office.  Call this number if you have problems the morning of surgery:  [phone]   If you have any questions prior to your surgery date call [phone]: Open Monday-Friday 8am-4pm    Remember:  Do not eat or drink anything after midnight the night before your surgery     Take these medicines the morning of surgery with A SIP OF WATER metoprolol succinate (TOPROL-XL) 50 MG 24 hr tablet  IF NEEDED  nitroGLYCERIN (NITROSTAT) 0.4 MG SL tablet  Follow your surgeon's instructions on when to stop Aspirin and Plavix.  If no instructions were given by your surgeon then you will need to call the office to get those instructions.    As of today, STOP taking Aleve, Naproxen, Ibuprofen, Motrin, Advil, Goody's, BC's, all herbal medications, fish oil, and all vitamins.          Do not wear jewelry  Do not wear lotions, powders, colognes, or deodorant. Men may shave face and neck. Do not bring valuables to the hospital.             [facility] is not responsible for any belongings or valuables.  Do NOT Smoke (Tobacco/Vaping)  24 hours prior to your procedure If you use a CPAP at night, you may bring all equipment for your overnight stay.   Contacts, glasses, dentures or bridgework may not be worn into surgery, please bring cases for these belongings   For patients admitted to the hospital, discharge time will be determined by your treatment team.   Patients discharged the day of surgery will not be allowed to drive home, and someone needs to stay with them for 24 hours.  ONLY 1 SUPPORT PERSON MAY BE PRESENT WHILE YOU ARE IN SURGERY. IF YOU ARE TO BE ADMITTED ONCE YOU ARE IN YOUR ROOM YOU WILL BE ALLOWED TWO (2) VISITORS.  Minor children may have two parents present. Special consideration for safety and communication needs  will be reviewed on a case by case basis.  Special instructions:    Oral Hygiene is also important to reduce your risk of infection.  Remember - BRUSH YOUR TEETH THE MORNING OF SURGERY WITH YOUR REGULAR TOOTHPASTE   [place]- Preparing For Surgery  Before surgery, you can play an important role. Because skin is not sterile, your skin needs to be as free of germs as possible. You can reduce the number of germs on your skin by washing with CHG (chlorahexidine gluconate) Soap before surgery.  CHG is an antiseptic cleaner which kills germs and bonds with the skin to continue killing germs even after washing.     Please do not use if you have an allergy to CHG or antibacterial soaps. If your skin becomes reddened/irritated stop using the CHG.  Do not shave (including legs and underarms) for at least 48 hours prior to first CHG shower. It is OK to shave your face.  Please follow these instructions carefully.     Shower the NIGHT BEFORE SURGERY and the MORNING OF SURGERY with CHG Soap.   If you chose to wash your hair, wash your hair first as usual with your normal shampoo. After you shampoo, rinse your hair and body thoroughly to remove the shampoo.  Then [employer] and genitals (private parts) with your normal soap and rinse thoroughly to remove  soap.  After that Use CHG Soap as you would any other liquid soap. You can apply CHG directly to the skin and wash gently with a scrungie or a clean washcloth.   Apply the CHG Soap to your body ONLY FROM THE NECK DOWN.  Do not use on open wounds or open sores. Avoid contact with your eyes, ears, mouth and genitals (private parts). Wash Face and genitals (private parts)  with your normal soap.   Wash thoroughly, paying special attention to the area where your surgery will be performed.  Thoroughly rinse your body with warm water from the neck down.  DO NOT shower/wash with your normal soap after using and rinsing off the CHG Soap.  Pat yourself dry  with a CLEAN TOWEL.  Wear CLEAN PAJAMAS to bed the night before surgery  Place CLEAN SHEETS on your bed the night before your surgery  DO NOT SLEEP WITH PETS.   Day of Surgery:  Take a shower with CHG soap. Wear Clean/Comfortable clothing the morning of surgery Do not apply any deodorants/lotions.   Remember to brush your teeth WITH YOUR REGULAR TOOTHPASTE.   Please read over the following fact sheets that you were given.

## 2020-09-21 ENCOUNTER — Encounter (HOSPITAL_COMMUNITY)
Admission: RE | Admit: 2020-09-21 | Discharge: 2020-09-21 | Disposition: A | Discharge status: Home / Self Care | Payer: Medicare Other | Source: Ambulatory Visit | Attending: Thoracic Surgery (Cardiothoracic Vascular Surgery) | Admitting: Thoracic Surgery (Cardiothoracic Vascular Surgery)

## 2020-09-21 ENCOUNTER — Encounter (HOSPITAL_COMMUNITY): Payer: Self-pay

## 2020-09-21 ENCOUNTER — Other Ambulatory Visit: Payer: Self-pay

## 2020-09-21 DIAGNOSIS — Z01818 Encounter for other preprocedural examination: Secondary | ICD-10-CM

## 2020-09-21 DIAGNOSIS — Z951 Presence of aortocoronary bypass graft: Secondary | ICD-10-CM | POA: Diagnosis not present

## 2020-09-21 DIAGNOSIS — J984 Other disorders of lung: Secondary | ICD-10-CM | POA: Diagnosis not present

## 2020-09-21 DIAGNOSIS — I509 Heart failure, unspecified: Secondary | ICD-10-CM | POA: Insufficient documentation

## 2020-09-21 HISTORY — DX: Atherosclerotic heart disease of native coronary artery without angina pectoris: I25.10

## 2020-09-21 HISTORY — DX: Acute myocardial infarction, unspecified: I21.9

## 2020-09-21 LAB — CBC
HCT: 35.2 % — ABNORMAL LOW (ref 36.0–46.0)
Hemoglobin: 11.7 g/dL — ABNORMAL LOW (ref 12.0–15.0)
MCH: 32.7 pg (ref 26.0–34.0)
MCHC: 33.2 g/dL (ref 30.0–36.0)
MCV: 98.3 fL (ref 80.0–100.0)
Platelets: 186 10*3/uL (ref 150–400)
RBC: 3.58 MIL/uL — ABNORMAL LOW (ref 3.87–5.11)
RDW: 14.9 % (ref 11.5–15.5)
WBC: 5.2 10*3/uL (ref 4.0–10.5)
nRBC: 0 % (ref 0.0–0.2)

## 2020-09-21 LAB — COMPREHENSIVE METABOLIC PANEL
ALT: 12 U/L (ref 0–44)
AST: 21 U/L (ref 15–41)
Albumin: 3.4 g/dL — ABNORMAL LOW (ref 3.5–5.0)
Alkaline Phosphatase: 95 U/L (ref 38–126)
Anion gap: 8 (ref 5–15)
BUN: 11 mg/dL (ref 8–23)
CO2: 26 mmol/L (ref 22–32)
Calcium: 9.1 mg/dL (ref 8.9–10.3)
Chloride: 103 mmol/L (ref 98–111)
Creatinine, Ser: 0.83 mg/dL (ref 0.44–1.00)
GFR, Estimated: >60 mL/min (ref >60)
Glucose, Bld: 90 mg/dL (ref 70–99)
Potassium: 3 mmol/L — ABNORMAL LOW (ref 3.5–5.1)
Sodium: 137 mmol/L (ref 135–145)
Total Bilirubin: 0.7 mg/dL (ref 0.3–1.2)
Total Protein: 7 g/dL (ref 6.5–8.1)

## 2020-09-21 LAB — PROTIME-INR
INR: 1.1 (ref 0.8–1.2)
Prothrombin Time: 14 seconds (ref 11.4–15.2)

## 2020-09-21 LAB — APTT: aPTT: 29 seconds (ref 24–36)

## 2020-09-21 NOTE — Progress Notes (Signed)
PCP - Dr. Derinda Late Cardiologist - Marrianne Mood, PA-C  PPM/ICD - Denies  Chest x-ray - 09/21/20 EKG - 07/24/20 Stress Test - Denies ECHO - 07/25/20 Cardiac Cath - 07/27/20  Sleep Study - Denies  Patient denies having diabetes.  Blood Thinner/Aspirin Instructions: LD: ASA 09/20/20  ERAS Protcol - No  COVID TEST- N/A   Anesthesia review: Yes, cardiac hx  Patient denies shortness of breath, fever, cough and chest pain at PAT appointment   All instructions explained to the patient, with a verbal understanding of the material. Patient agrees to go over the instructions while at home for a better understanding. Patient also instructed to self quarantine after being tested for COVID-19. The opportunity to ask questions was provided.

## 2020-09-22 ENCOUNTER — Ambulatory Visit: Payer: Medicare Other | Admitting: Physician Assistant

## 2020-09-22 ENCOUNTER — Encounter (HOSPITAL_COMMUNITY): Payer: Self-pay

## 2020-09-22 ENCOUNTER — Other Ambulatory Visit: Payer: Medicare Other

## 2020-09-22 NOTE — Progress Notes (Signed)
Patient voiced understanding of new arrival time of 1145 on Monday 09/25/2020

## 2020-09-22 NOTE — Progress Notes (Signed)
Anesthesia Chart Review:   Case: [id number] Date/Time: 09/25/20 1334   Procedure: VIDEO BRONCHOSCOPY WITH ENDOBRONCHIAL NAVIGATION   Anesthesia type: General   Pre-op diagnosis: LLL MASS   Location: [facility] OR ROOM 2 / [facility]   Surgeons: Melrose Nakayama, MD       DISCUSSION: Pt is 80 years old with hx CAD (s/ CABG x4 05/03/20), CHF, ischemic cardiomyopathy (EF 20-25% x 07/25/20 echo) PAD, HTN, on home O2 as needed  Hospitalized 7/11-15/22 for acute on chronic HFrEF  VS: BP 135/71   Pulse 86   Temp 36.5 C (Oral)   Resp 19   Ht 5' (1.524 m)   Wt 52.3 kg   SpO2 99%   BMI 22.52 kg/m   PROVIDERS: - PCP is Derinda Late, MD -   LABS: Labs reviewed: Acceptable for surgery. (all labs ordered are listed, but only abnormal results are displayed)  Labs Reviewed  COMPREHENSIVE METABOLIC PANEL - Abnormal; Notable for the following components:      Result Value   Potassium 3.0 (*)    Albumin 3.4 (*)    All other components within normal limits  CBC - Abnormal; Notable for the following components:   RBC 3.58 (*)    Hemoglobin 11.7 (*)    HCT 35.2 (*)    All other components within normal limits  PROTIME-INR  APTT     IMAGES: CXR 09/21/20: results pending   CT chest 09/05/20:  1. No significant interval change in a spiculated appearing, thick-walled subpleural cavitary lesion of the dependent left lower lobe, measuring 5.4 x 3.6 cm. 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 Recommend multi disciplinary thoracic referral for consideration of metabolic characterization and tissue sampling. 2. Emphysema. 3. Coronary artery disease   EKG 07/24/20: Sinus tachycardia (131 bpm). Nonspecific intraventricular conduction delay. Probable anteroseptal infarct, recent. Lateral leads are also involved.  Baseline wander in lead(s) V3 V6   CV: R/L Cardiac cath 07/27/20:  Ost LAD to Prox LAD lesion is 80% stenosed.   Ost Cx to Prox Cx lesion is 80% stenosed.   Ost RCA to Prox RCA lesion is 60% stenosed.   Mid RCA lesion is 100% stenosed.   Ost LM to Dist LM lesion is 90% stenosed.   SVG graft was visualized by angiography and is normal in caliber.   SVG graft was visualized by angiography and is normal in caliber.   LIMA graft was visualized by non-selective angiography and is normal in caliber.   The graft exhibits no disease.   The graft exhibits no disease.   The graft exhibits no disease. 1.  Severe underlying left main and three-vessel coronary artery disease with patent grafts including LIMA to LAD, SVG to OM1 and SVG to right PDA/OM. 2.  Left ventricular angiography was not performed.  EF was severely reduced by echo. 3.  Significant peripheral arterial disease with heavy calcified vessels.  There is evidence of moderate to severe disease affecting the right common femoral artery. 4.  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  Her grafts are patent.  Continue medical therapy for coronary artery disease. She is still volume overloaded and thus recommending continuing IV diuresis.  Optimize heart failure medications  Echo 07/25/20:  1. Left ventricular ejection fraction, by estimation, is 20 to 25%. The left ventricle has severely decreased function. The left ventricle demonstrates global hypokinesis. The left ventricular internal cavity size was mildly to moderately dilated. Left ventricular diastolic parameters were normal.   2. Right ventricular systolic function is normal. The right ventricular size is mildly enlarged.   3. Left atrial size was mildly dilated.   4. Right atrial size was mildly dilated.    5. Moderate pleural effusion in the left lateral region.   6. The mitral valve is grossly normal. Moderate mitral valve  regurgitation.   7. The aortic valve is grossly normal. Aortic valve regurgitation is trivial.    Past Medical History:  Diagnosis Date   Arthritis    Breast cancer ([place]) 2011   radiation- Left   CHF (congestive heart failure) (HCC)    Hyperlipidemia    Hypertension    Myocardial infarction [facility])    Personal history of radiation therapy    PVD (peripheral vascular disease) ([place])     Past Surgical History:  Procedure Laterality Date   ABDOMINAL HYSTERECTOMY     APPENDECTOMY     BREAST BIOPSY Left 12/21/2009   positive, radiation dcis   BREAST EXCISIONAL BIOPSY Left 01/17/2010   lumpectomy DCIS   BREAST LUMPECTOMY     CATARACT EXTRACTION W/ INTRAOCULAR LENS IMPLANT & ANTERIOR VITRECTOMY, BILATERAL     CHOLECYSTECTOMY     COLONOSCOPY WITH PROPOFOL N/A 09/25/2017   Procedure: COLONOSCOPY WITH PROPOFOL;  Surgeon: Lollie Sails, MD;  Location[facility] ENDOSCOPY;  Service: Endoscopy;  Laterality: N/A;   CORONARY ARTERY BYPASS GRAFT N/A 05/03/2020   Procedure: CORONARY ARTERY BYPASS GRAFTING (CABG) X FOUR ON PUMP USING LEFT INTERNAL MAMMARY ARTERY AND RIGHT ENDOSCOPIC SAPHEOUS VEIN HARVEST CONDUITS;  Surgeon: Melrose Nakayama, MD;  Location: [facility];  Service: Open Heart Surgery;  Laterality: N/A;   DILATION AND CURETTAGE OF UTERUS     JOINT REPLACEMENT Right 01/24/2016   KNEE ARTHROPLASTY Right 01/24/2016   Procedure: COMPUTER ASSISTED TOTAL KNEE ARTHROPLASTY;  Surgeon: Dereck Leep, MD;  Location: [facility] ORS;  Service: Orthopedics;  Laterality: Right;   LEFT HEART CATH AND CORONARY ANGIOGRAPHY N/A 05/01/2020   Procedure: LEFT HEART CATH AND CORONARY ANGIOGRAPHY;  Surgeon: Wellington Hampshire, MD;  Location: [facility] CV LAB;  Service: Cardiovascular;  Laterality: N/A;   RIGHT/LEFT HEART CATH AND CORONARY/GRAFT ANGIOGRAPHY N/A 07/27/2020   Procedure:  RIGHT/LEFT HEART CATH AND CORONARY/GRAFT ANGIOGRAPHY;  Surgeon: Wellington Hampshire, MD;  Location: [facility] CV LAB;  Service: Cardiovascular;  Laterality: N/A;   stent in Lt leg Left    TEE WITHOUT CARDIOVERSION N/A 05/03/2020   Procedure: TRANSESOPHAGEAL ECHOCARDIOGRAM (TEE);  Surgeon: Melrose Nakayama, MD;  Location: [facility];  Service: Open Heart Surgery;  Laterality: N/A;    MEDICATIONS:  aspirin EC 81 MG tablet   atorvastatin (LIPITOR) 20 MG tablet   Calcium Carbonate-Vit D-Min (CALTRATE 600+D PLUS PO)   clopidogrel (PLAVIX) 75 MG tablet   furosemide (LASIX) 40 MG tablet   metoprolol succinate (TOPROL-XL) 50 MG 24 hr tablet   Multiple Vitamin (MULTIVITAMIN WITH MINERALS) TABS tablet   nitroGLYCERIN (NITROSTAT) 0.4 MG SL tablet   potassium chloride SA (KLOR-CON) 10 MEQ tablet   sacubitril-valsartan (ENTRESTO) 24-26 MG   No current facility-administered medications for this encounter.    If no changes, I anticipate pt can proceed with surgery as scheduled.   Willeen Cass, PhD, FNP-BC [facility] Short Stay  Surgical Center/Anesthesiology Phone: [phone] 09/22/2020 3:48 PM

## 2020-09-22 NOTE — Anesthesia Preprocedure Evaluation (Addendum)
Anesthesia Evaluation  Patient identified by MRN, date of birth, ID band Patient awake    Reviewed: Allergy & Precautions, NPO status , Patient's Chart, lab work & pertinent test results  Airway Mallampati: III  TM Distance: >3 FB     Dental   Pulmonary neg pulmonary ROS, former smoker,    breath sounds clear to auscultation       Cardiovascular hypertension, + angina + CAD, + Past MI, + Peripheral Vascular Disease and +CHF   Rhythm:Regular Rate:Normal     Neuro/Psych negative neurological ROS     GI/Hepatic negative GI ROS, Neg liver ROS,   Endo/Other  negative endocrine ROS  Renal/GU negative Renal ROS     Musculoskeletal   Abdominal   Peds  Hematology   Anesthesia Other Findings   Reproductive/Obstetrics                           Anesthesia Physical Anesthesia Plan  ASA: 3  Anesthesia Plan: General   Post-op Pain Management:    Induction: Intravenous  PONV Risk Score and Plan: 3 and Ondansetron, Dexamethasone and Midazolam  Airway Management Planned: Oral ETT  Additional Equipment:   Intra-op Plan:   Post-operative Plan: Possible Post-op intubation/ventilation  Informed Consent: I have reviewed the patients History and Physical, chart, labs and discussed the procedure including the risks, benefits and alternatives for the proposed anesthesia with the patient or authorized representative who has indicated his/her understanding and acceptance.     Dental advisory given  Plan Discussed with: CRNA, Anesthesiologist and Surgeon  Anesthesia Plan Comments: (See APP note by Durel Salts, FNP )      Anesthesia Quick Evaluation

## 2020-09-25 ENCOUNTER — Ambulatory Visit (HOSPITAL_COMMUNITY): Payer: Medicare Other

## 2020-09-25 ENCOUNTER — Ambulatory Visit (HOSPITAL_COMMUNITY): Payer: Medicare Other | Admitting: Emergency Medicine

## 2020-09-25 ENCOUNTER — Ambulatory Visit (HOSPITAL_COMMUNITY)
Admission: RE | Admit: 2020-09-25 | Discharge: 2020-09-25 | Disposition: A | Discharge status: Home / Self Care | Payer: Medicare Other | Attending: Thoracic Surgery (Cardiothoracic Vascular Surgery) | Admitting: Thoracic Surgery (Cardiothoracic Vascular Surgery)

## 2020-09-25 ENCOUNTER — Other Ambulatory Visit: Payer: Self-pay

## 2020-09-25 ENCOUNTER — Encounter (HOSPITAL_COMMUNITY)
Admission: RE | Disposition: A | Discharge status: Home / Self Care | Payer: Self-pay | Source: Home / Self Care | Attending: Thoracic Surgery (Cardiothoracic Vascular Surgery)

## 2020-09-25 ENCOUNTER — Encounter (HOSPITAL_COMMUNITY): Payer: Self-pay | Admitting: Thoracic Surgery (Cardiothoracic Vascular Surgery)

## 2020-09-25 DIAGNOSIS — Z419 Encounter for procedure for purposes other than remedying health state, unspecified: Secondary | ICD-10-CM

## 2020-09-25 DIAGNOSIS — J984 Other disorders of lung: Secondary | ICD-10-CM

## 2020-09-25 DIAGNOSIS — Z853 Personal history of malignant neoplasm of breast: Secondary | ICD-10-CM | POA: Insufficient documentation

## 2020-09-25 DIAGNOSIS — I252 Old myocardial infarction: Secondary | ICD-10-CM | POA: Diagnosis not present

## 2020-09-25 DIAGNOSIS — Z923 Personal history of irradiation: Secondary | ICD-10-CM | POA: Insufficient documentation

## 2020-09-25 DIAGNOSIS — R918 Other nonspecific abnormal finding of lung field: Secondary | ICD-10-CM | POA: Diagnosis present

## 2020-09-25 DIAGNOSIS — Z7982 Long term (current) use of aspirin: Secondary | ICD-10-CM | POA: Diagnosis not present

## 2020-09-25 DIAGNOSIS — I2510 Atherosclerotic heart disease of native coronary artery without angina pectoris: Secondary | ICD-10-CM | POA: Insufficient documentation

## 2020-09-25 DIAGNOSIS — Z951 Presence of aortocoronary bypass graft: Secondary | ICD-10-CM | POA: Diagnosis not present

## 2020-09-25 DIAGNOSIS — I509 Heart failure, unspecified: Secondary | ICD-10-CM | POA: Diagnosis not present

## 2020-09-25 DIAGNOSIS — Z79899 Other long term (current) drug therapy: Secondary | ICD-10-CM | POA: Diagnosis not present

## 2020-09-25 DIAGNOSIS — R222 Localized swelling, mass and lump, trunk: Secondary | ICD-10-CM | POA: Diagnosis not present

## 2020-09-25 DIAGNOSIS — Z87891 Personal history of nicotine dependence: Secondary | ICD-10-CM | POA: Diagnosis not present

## 2020-09-25 DIAGNOSIS — E785 Hyperlipidemia, unspecified: Secondary | ICD-10-CM | POA: Diagnosis not present

## 2020-09-25 DIAGNOSIS — C3432 Malignant neoplasm of lower lobe, left bronchus or lung: Secondary | ICD-10-CM | POA: Insufficient documentation

## 2020-09-25 DIAGNOSIS — I739 Peripheral vascular disease, unspecified: Secondary | ICD-10-CM | POA: Diagnosis not present

## 2020-09-25 DIAGNOSIS — I110 Hypertensive heart disease with heart failure: Secondary | ICD-10-CM | POA: Insufficient documentation

## 2020-09-25 HISTORY — PX: VIDEO BRONCHOSCOPY WITH ENDOBRONCHIAL NAVIGATION: SHX6175

## 2020-09-25 SURGERY — VIDEO BRONCHOSCOPY WITH ENDOBRONCHIAL NAVIGATION
Anesthesia: General

## 2020-09-25 MED ORDER — ETOMIDATE 2 MG/ML IV SOLN
INTRAVENOUS | Status: DC | PRN
  Administered 2020-09-25: 14 mg via INTRAVENOUS

## 2020-09-25 MED ORDER — ORAL CARE MOUTH RINSE: 15.0000 mL | Freq: Once | OROMUCOSAL | Status: AC

## 2020-09-25 MED ORDER — LIDOCAINE 2% (20 MG/ML) 5 ML SYRINGE
INTRAMUSCULAR | Status: DC | PRN
  Administered 2020-09-25: 80 mg via INTRAVENOUS

## 2020-09-25 MED ORDER — SUGAMMADEX SODIUM 200 MG/2ML IV SOLN
INTRAVENOUS | Status: DC | PRN
  Administered 2020-09-25: 200 mg via INTRAVENOUS

## 2020-09-25 MED ORDER — ETOMIDATE 2 MG/ML IV SOLN
INTRAVENOUS | Status: AC
  Filled 2020-09-25: qty 10

## 2020-09-25 MED ORDER — FENTANYL CITRATE (PF) 250 MCG/5ML IJ SOLN
INTRAMUSCULAR | Status: AC
  Filled 2020-09-25: qty 5

## 2020-09-25 MED ORDER — LACTATED RINGERS IV SOLN: INTRAVENOUS | Status: DC

## 2020-09-25 MED ORDER — FENTANYL CITRATE (PF) 100 MCG/2ML IJ SOLN: 25.0000 ug | INTRAMUSCULAR | Status: DC | PRN

## 2020-09-25 MED ORDER — DEXAMETHASONE SODIUM PHOSPHATE 10 MG/ML IJ SOLN
INTRAMUSCULAR | Status: DC | PRN
  Administered 2020-09-25: 10 mg via INTRAVENOUS

## 2020-09-25 MED ORDER — CHLORHEXIDINE GLUCONATE 0.12 % MT SOLN: 15.0000 mL | Freq: Once | OROMUCOSAL | Status: AC

## 2020-09-25 MED ORDER — FENTANYL CITRATE (PF) 250 MCG/5ML IJ SOLN
INTRAMUSCULAR | Status: DC | PRN
  Administered 2020-09-25: 100 ug via INTRAVENOUS

## 2020-09-25 MED ORDER — ONDANSETRON HCL 4 MG/2ML IJ SOLN
INTRAMUSCULAR | Status: DC | PRN
  Administered 2020-09-25: 4 mg via INTRAVENOUS

## 2020-09-25 MED ORDER — CHLORHEXIDINE GLUCONATE 0.12 % MT SOLN
OROMUCOSAL | Status: AC
  Administered 2020-09-25: 15 mL via OROMUCOSAL
  Filled 2020-09-25: qty 15

## 2020-09-25 MED ORDER — EPINEPHRINE PF 1 MG/ML IJ SOLN
INTRAMUSCULAR | Status: AC
  Filled 2020-09-25: qty 1

## 2020-09-25 MED ORDER — PROPOFOL 10 MG/ML IV BOLUS
INTRAVENOUS | Status: AC
  Filled 2020-09-25: qty 20

## 2020-09-25 MED ORDER — ROCURONIUM BROMIDE 10 MG/ML (PF) SYRINGE
PREFILLED_SYRINGE | INTRAVENOUS | Status: DC | PRN
  Administered 2020-09-25: 50 mg via INTRAVENOUS

## 2020-09-25 MED ORDER — EPINEPHRINE PF 1 MG/ML IJ SOLN
INTRAMUSCULAR | Status: DC | PRN
  Administered 2020-09-25: 1 mg via ENDOTRACHEOPULMONARY

## 2020-09-25 MED ORDER — 0.9 % SODIUM CHLORIDE (POUR BTL) OPTIME
TOPICAL | Status: DC | PRN
  Administered 2020-09-25: 1000 mL

## 2020-09-25 SURGICAL SUPPLY — 46 items
ADAPTER BRONCHOSCOPE OLYMP 190 (ADAPTER) ×1 IMPLANT
ADAPTER BRONCHOSCOPE OLYMPUS (ADAPTER) ×2 IMPLANT
ADAPTER VALVE BIOPSY EBUS (MISCELLANEOUS) IMPLANT
ADPR BSCP OLMPS EDG (ADAPTER) ×1
ADPR BSCP STRL LF REUSE (ADAPTER) ×1
ADPTR VALVE BIOPSY EBUS (MISCELLANEOUS)
BLADE CLIPPER SURG (BLADE) ×2 IMPLANT
BRUSH BIOPSY BRONCH 10 SDTNB (MISCELLANEOUS) ×1 IMPLANT
BRUSH SUPERTRAX BIOPSY (INSTRUMENTS) ×1 IMPLANT
BRUSH SUPERTRAX NDL-TIP CYTO (INSTRUMENTS) ×2 IMPLANT
CANISTER SUCT 3000ML PPV (MISCELLANEOUS) ×2 IMPLANT
CNTNR URN SCR LID CUP LEK RST (MISCELLANEOUS) ×2 IMPLANT
CONT SPEC 4OZ STRL OR WHT (MISCELLANEOUS) ×4
COVER BACK TABLE 60X90IN (DRAPES) ×2 IMPLANT
FILTER STRAW FLUID ASPIR (MISCELLANEOUS) ×1 IMPLANT
FORCEPS BIOP SUPERTRX PREMAR (INSTRUMENTS) ×1 IMPLANT
GAUZE SPONGE 4X4 12PLY STRL (GAUZE/BANDAGES/DRESSINGS) ×2 IMPLANT
GLOVE SURG SIGNA 7.5 PF LTX (GLOVE) ×2 IMPLANT
GOWN STRL REUS W/ TWL XL LVL3 (GOWN DISPOSABLE) ×1 IMPLANT
GOWN STRL REUS W/TWL XL LVL3 (GOWN DISPOSABLE) ×2
KIT CLEAN ENDO COMPLIANCE (KITS) ×2 IMPLANT
KIT ILLUMISITE 180 PROCEDURE (KITS) ×1 IMPLANT
KIT ILLUMISITE 90 PROCEDURE (KITS) IMPLANT
KIT TURNOVER KIT B (KITS) ×2 IMPLANT
MARKER SKIN DUAL TIP RULER LAB (MISCELLANEOUS) ×2 IMPLANT
NDL SUPERTRX PREMARK BIOPSY (NEEDLE) IMPLANT
NEEDLE SUPERTRX PREMARK BIOPSY (NEEDLE) ×2 IMPLANT
NS IRRIG 1000ML POUR BTL (IV SOLUTION) ×2 IMPLANT
OIL SILICONE PENTAX (PARTS (SERVICE/REPAIRS)) ×3 IMPLANT
PAD ARMBOARD 7.5X6 YLW CONV (MISCELLANEOUS) ×4 IMPLANT
PATCHES PATIENT (LABEL) ×6 IMPLANT
SLEEVE SCD COMPRESS KNEE MED (STOCKING) ×1 IMPLANT
SYR 20ML ECCENTRIC (SYRINGE) ×3 IMPLANT
SYR 20ML LL LF (SYRINGE) ×2 IMPLANT
SYR 30ML LL (SYRINGE) ×2 IMPLANT
SYR 5ML LL (SYRINGE) ×2 IMPLANT
SYR TB 1ML LUER SLIP (SYRINGE) IMPLANT
TOWEL GREEN STERILE (TOWEL DISPOSABLE) ×2 IMPLANT
TOWEL GREEN STERILE FF (TOWEL DISPOSABLE) ×2 IMPLANT
TRAP SPECIMEN MUCUS 40CC (MISCELLANEOUS) ×2 IMPLANT
TUBE CONNECTING 20X1/4 (TUBING) ×4 IMPLANT
UNDERPAD 30X36 HEAVY ABSORB (UNDERPADS AND DIAPERS) ×2 IMPLANT
VALVE BIOPSY  SINGLE USE (MISCELLANEOUS) ×2
VALVE BIOPSY SINGLE USE (MISCELLANEOUS) ×1 IMPLANT
VALVE SUCTION BRONCHIO DISP (MISCELLANEOUS) ×2 IMPLANT
WATER STERILE IRR 1000ML POUR (IV SOLUTION) ×2 IMPLANT

## 2020-09-25 NOTE — Interval H&P Note (Signed)
History and Physical Interval Note: PET reviewed will proceed with ENB   09/25/2020 1:59 PM  [name]  has presented today for surgery, with the diagnosis of LLL MASS.  The various methods of treatment have been discussed with the patient and family. After consideration of risks, benefits and other options for treatment, the patient has consented to  Procedure(s): [name] (N/A) as a surgical intervention.  The patient's history has been reviewed, patient examined, no change in status, stable for surgery.  I have reviewed the patient's chart and labs.  Questions were answered to the patient's satisfaction.     Melrose Nakayama

## 2020-09-25 NOTE — Transfer of Care (Signed)
Immediate Anesthesia Transfer of Care Note  Patient: [name]  Procedure(s) Performed: VIDEO BRONCHOSCOPY WITH ENDOBRONCHIAL NAVIGATION  Patient Location: PACU  Anesthesia Type:General  Level of Consciousness: awake, alert  and oriented  Airway & Oxygen Therapy: Patient Spontanous Breathing and Patient connected to face mask oxygen  Post-op Assessment: Report given to RN and Post -op Vital signs reviewed and stable  Post vital signs: Reviewed and stable  Last Vitals:  Vitals Value Taken Time  BP 151/60 09/25/20 1558  Temp    Pulse 105 09/25/20 1559  Resp 21 09/25/20 1559  SpO2 94 % 09/25/20 1559  Vitals shown include unvalidated device data.  Last Pain:  Vitals:   09/25/20 1227  TempSrc:   PainSc: 0-No pain         Complications: No notable events documented.

## 2020-09-25 NOTE — Anesthesia Procedure Notes (Signed)
Procedure Name: Intubation Date/Time: 09/25/2020 2:43 PM Performed by: Babs Bertin, CRNA Pre-anesthesia Checklist: Patient identified, Emergency Drugs available, Suction available and Patient being monitored Patient Re-evaluated:Patient Re-evaluated prior to induction Oxygen Delivery Method: Circle System Utilized Preoxygenation: Pre-oxygenation with 100% oxygen Induction Type: IV induction Ventilation: Mask ventilation without difficulty Laryngoscope Size: Mac and 3 Grade View: Grade I Tube type: Oral Tube size: 8.5 mm Number of attempts: 1 Airway Equipment and Method: Stylet and Oral airway Placement Confirmation: ETT inserted through vocal cords under direct vision, positive ETCO2 and breath sounds checked- equal and bilateral Secured at: 20 cm Tube secured with: Tape Dental Injury: Teeth and Oropharynx as per pre-operative assessment

## 2020-09-25 NOTE — Brief Op Note (Signed)
09/25/2020  3:51 PM  PATIENT:  [name]  80 y.o. female  PRE-OPERATIVE DIAGNOSIS:  Left Lower Lobe MASS  POST-OPERATIVE DIAGNOSIS:  Left Lower Lobe MASS  PROCEDURE:  Procedure(s): VIDEO BRONCHOSCOPY WITH ENDOBRONCHIAL NAVIGATION (N/A) Needle aspirations, brushings and transbronchial biopsies  SURGEON:  Surgeon(s) and Role:    * Melrose Nakayama, MD - Primary  PHYSICIAN ASSISTANT:   ASSISTANTS: none   ANESTHESIA:   general  EBL: minimal  BLOOD ADMINISTERED:none  DRAINS: none   LOCAL MEDICATIONS USED:  NONE  SPECIMEN:  Source of Specimen:  left lower lobe  DISPOSITION OF SPECIMEN:   Path and micro  COUNTS:  NO endo  TOURNIQUET:  * No tourniquets in log *  DICTATION: .Other Dictation: Dictation Number -  PLAN OF CARE: Discharge to home after PACU  PATIENT DISPOSITION:  PACU - hemodynamically stable.   Delay start of Pharmacological VTE agent (>24hrs) due to surgical blood loss or risk of bleeding: not applicable

## 2020-09-25 NOTE — Anesthesia Postprocedure Evaluation (Signed)
Anesthesia Post Note  Patient: [name]  Procedure(s) Performed: VIDEO BRONCHOSCOPY WITH ENDOBRONCHIAL NAVIGATION     Patient location during evaluation: PACU Anesthesia Type: General Level of consciousness: awake Pain management: pain level controlled Vital Signs Assessment: post-procedure vital signs reviewed and stable Respiratory status: spontaneous breathing Cardiovascular status: stable Postop Assessment: no apparent nausea or vomiting Anesthetic complications: no   No notable events documented.  Last Vitals:  Vitals:   09/25/20 1613 09/25/20 1627  BP: (!) 140/58 (!) 132/59  Pulse: 91 96  Resp: 11 20  Temp:  36.9 C  SpO2: 99% 93%    Last Pain:  Vitals:   09/25/20 1627  TempSrc:   PainSc: 0-No pain                 Wilberth Damon

## 2020-09-25 NOTE — Discharge Instructions (Addendum)
Do not drive or engage in heavy physical activity for 24 hours  You may resume normal activities tomorrow  You may cough up small amounts of blood over the next few days  You may use acetaminophen (Tylenol) if needed for discomfort. Do not exceed recommended doses.  Resume Plavix on Wednesday 9/14  Call [phone] if you develop chest pain, shortness of breath, fever > 101 F or cough up more than 2 tablespoons of blood  My office will contact you with a follow up appointment for next week to discuss results

## 2020-09-26 ENCOUNTER — Encounter (HOSPITAL_COMMUNITY): Payer: Self-pay | Admitting: Thoracic Surgery (Cardiothoracic Vascular Surgery)

## 2020-09-26 NOTE — Op Note (Signed)
[name], [name] MEDICAL RECORD NO: [id number] ACCOUNT NO: [id number] DATE OF BIRTH: September 09, 1940 FACILITY: [facility] LOCATION: [facility] PHYSICIAN: Revonda Standard. Roxan Hockey, MD  Operative Report   DATE OF PROCEDURE: 09/25/2020  PREOPERATIVE DIAGNOSIS:  Left lower lobe lung mass.  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  PROCEDURE:  Electromagnetic navigational bronchoscopy with needle aspirations, brushings and transbronchial biopsies.  SURGEON:  Revonda Standard. Roxan Hockey, MD  ASSISTANT:  None.  ANESTHESIA:  General.  FINDINGS:  Initial quick prep showed extensive necrosis with a few atypical cells.  Final brushings were suspicious for non-small cell carcinoma.  CLINICAL NOTE:  [name] is a 80 year old woman who presented with a non-ST elevation MI and underwent coronary bypass grafting back in April.  She had an abnormal chest x-ray and a CT showed an unusual appearing left lower lobe lung mass.  On  followup chest x-ray, the lesion appeared smaller and more cavitary. On CT, the size of the lesion was unchanged.  On PET CT, the mass was markedly hypermetabolic with no evidence of regional or distant metastatic disease.  She was advised to undergo  navigational bronchoscopy for diagnostic purposes.  The indications, risks, benefits, and alternatives were discussed in detail with the patient.  She understood and accepted the risks and agreed to proceed.  OPERATIVE NOTE:  [name] was brought to the operating room on 09/25/2020.  She had induction of anesthesia and was intubated.  Planning for the navigational bronchoscopy was done on the console prior to induction.  Sequential compression devices were  placed on the calves for DVT prophylaxis.  A Bair Hugger was placed for active warming.  A timeout was performed.  Flexible fiberoptic bronchoscopy was performed via the endotracheal tube. It revealed normal endobronchial anatomy with no  endobronchial lesions to the level of the subsegmental bronchi.  Locatable guide for navigation was  placed.  Registration was performed.  The bronchoscope was directed to the left lower lobe bronchus and the appropriate posterior basilar subsegmental bronchus was cannulated.  There was good proximity to the lesion and good alignment.  Multiple  brushings were obtained followed by multiple needle aspirations and then multiple transbronchial biopsies were performed.  Some biopsy specimens were sent for AFB and fungal cultures, but the majority were sent for permanent pathology.  While awaiting  the results of the quick prep on those specimens, the catheter was redirected to a different angle and the brushings and biopsies were repeated, but at this point, the brushings and needle aspiration results from the initial samples showed extensive  necrosis with only a few atypical cells.  The catheter was repositioned and the sampling process was repeated.  Finally, a bronchoalveolar lavage was performed with 100 mL of saline with return of about 15 mL of bloody fluid.  This was sent for cytology,  sampling then was performed directly into the subsegmental bronchus without the catheter advanced and brushings and biopsies were obtained.  These brushings showed adequate specimen for diagnosis of non-small cell carcinoma.  All sampling was done with  fluoroscopy and the total fluoroscopy time was 4.6 minutes with a total dose of 36 milligrays.  There was bleeding after the biopsies.  Dilute epinephrine was applied topically. There was no significant ongoing bleeding at the completion of the  procedure.  The bronchoscope was removed.  The patient was extubated in the operating room and taken to the postanesthetic care unit in good condition.   NIK D: 09/25/2020 6:16:03 pm  T: 09/26/2020 1:10:00 am  JOB: [id number]/ [id number]

## 2020-09-27 ENCOUNTER — Ambulatory Visit
Admission: RE | Admit: 2020-09-27 | Discharge: 2020-09-27 | Disposition: A | Discharge status: Home / Self Care | Payer: Medicare Other | Source: Ambulatory Visit | Attending: Family Medicine | Admitting: Family Medicine

## 2020-09-27 ENCOUNTER — Other Ambulatory Visit: Payer: Self-pay

## 2020-09-27 DIAGNOSIS — Z1231 Encounter for screening mammogram for malignant neoplasm of breast: Secondary | ICD-10-CM | POA: Diagnosis present

## 2020-09-27 LAB — CYTOLOGY - NON PAP

## 2020-09-27 LAB — SURGICAL PATHOLOGY

## 2020-09-28 LAB — ACID FAST SMEAR (AFB, MYCOBACTERIA): Acid Fast Smear: NEGATIVE

## 2020-09-28 LAB — FUNGUS STAIN

## 2020-09-30 LAB — AEROBIC/ANAEROBIC CULTURE W GRAM STAIN (SURGICAL/DEEP WOUND)
Culture: NO GROWTH
Gram Stain: NONE SEEN

## 2020-10-04 ENCOUNTER — Other Ambulatory Visit: Payer: Self-pay

## 2020-10-04 ENCOUNTER — Other Ambulatory Visit: Payer: Self-pay | Admitting: *Deleted

## 2020-10-04 ENCOUNTER — Ambulatory Visit (INDEPENDENT_AMBULATORY_CARE_PROVIDER_SITE_OTHER): Payer: Medicare Other | Admitting: Thoracic Surgery (Cardiothoracic Vascular Surgery)

## 2020-10-04 VITALS — BP 130/68 | HR 89 | Resp 20 | Ht 60.0 in | Wt 116.0 lb

## 2020-10-04 DIAGNOSIS — R0602 Shortness of breath: Secondary | ICD-10-CM

## 2020-10-04 DIAGNOSIS — J984 Other disorders of lung: Secondary | ICD-10-CM

## 2020-10-04 DIAGNOSIS — Z9889 Other specified postprocedural states: Secondary | ICD-10-CM | POA: Diagnosis not present

## 2020-10-04 DIAGNOSIS — C7931 Secondary malignant neoplasm of brain: Secondary | ICD-10-CM

## 2020-10-04 DIAGNOSIS — I6523 Occlusion and stenosis of bilateral carotid arteries: Secondary | ICD-10-CM | POA: Diagnosis not present

## 2020-10-04 DIAGNOSIS — C3490 Malignant neoplasm of unspecified part of unspecified bronchus or lung: Secondary | ICD-10-CM

## 2020-10-04 DIAGNOSIS — I509 Heart failure, unspecified: Secondary | ICD-10-CM

## 2020-10-04 NOTE — Progress Notes (Signed)
[address]       [place] [address]             [phone]     HPI: Mrs. [name] returns for a follow-up visit to discuss results of her bronchoscopy.  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  She presented with a non-ST elevation MI back in April.  A left lower lobe mass was seen on chest x-ray.  On CT this was an unusual appearing mass and there also were calcified mediastinal lymph nodes.  Question was whether this was atypical infection/granulomatous disease versus cancer.  On follow-up chest x-ray in May the lesion appeared slightly smaller and was more cavitary.  I then saw her back with a CT which showed the mass was approximately the same size.  A PET/CT showed it was markedly hypermetabolic.  There was no evidence of regional or distant metastatic disease.  I did a navigational bronchoscopy on her last week.  Biopsy showed squamous cell carcinoma.  She had a couple of small episodes of hemoptysis but nothing concerning.  Past Medical History:  Diagnosis Date   Arthritis    Breast cancer ([place]) 2011   radiation- Left   CHF (congestive heart failure) ([facility])    Coronary artery disease    Hyperlipidemia    Hypertension    Myocardial infarction [facility])    Personal history of radiation therapy    PVD (peripheral vascular disease) ([facility])     Current Outpatient Medications  Medication Sig Dispense Refill   aspirin EC 81 MG tablet Take 81 mg by mouth daily.     atorvastatin (LIPITOR) 20 MG tablet Take 1 tablet (20 mg total) by mouth at bedtime. 90 tablet 3   Calcium Carbonate-Vit D-Min (CALTRATE 600+D PLUS PO) Take 1 tablet by mouth daily.      clopidogrel (PLAVIX) 75 MG  tablet Take 1 tablet (75 mg total) by mouth daily. 90 tablet 3   Multiple Vitamin (MULTIVITAMIN WITH MINERALS) TABS tablet Take 1 tablet by mouth daily.     nitroGLYCERIN (NITROSTAT) 0.4 MG SL tablet Place 1 tablet (0.4 mg total) under the tongue every 5 (five) minutes as needed for up to 10 days for chest pain. 10 tablet 0   furosemide (LASIX) 40 MG tablet Take 1 tablet (40 mg total) by mouth daily. 30 tablet 0   metoprolol succinate (TOPROL-XL) 50 MG 24 hr tablet Take 1 tablet (50 mg total) by mouth daily. Take with or immediately following a meal. 30 tablet 0   potassium chloride SA (KLOR-CON) 10 MEQ tablet Take 1 tablet (10 mEq total) by mouth daily. 30 tablet 0   sacubitril-valsartan (ENTRESTO) 24-26 MG Take 1 tablet by mouth 2 (two) times daily. 60 tablet 0   No current facility-administered medications for this visit.    Physical Exam BP 130/68   Pulse 89   Resp 20   Ht 5' (1.524 m)   Wt 116 lb (52.6 kg)   SpO2 96% Comment: RA  BMI 22.10 kg/m  80 year old woman in no acute distress Anxious Alert and oriented x3 with no focal deficits  Diagnostic Tests: FINAL MICROSCOPIC DIAGNOSIS:   A. LUNG, LEFT LOWER LOBE, BIOPSY:  -Non-small cell carcinoma.  -  See comment.  FINAL MICROSCOPIC DIAGNOSIS:  - Malignant cells consistent with squamous cell carcinoma  Impression: [name] is a 80 year old woman who is a former smoker.  She had coronary bypass grafting back in April.  She was found to have a left lower lobe lung mass.  On follow-up CT the mass was persistent.  It was hypermetabolic on PET/CT.  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  I discussed options for treatment.  I emphasized that surgery is by far her best option but given the size of the tumor chemotherapy would be recommended as well.  The other option would be chemo and radiation.  Chances for cure are dramatically better with surgery  than they are with chemo and radiation alone.  She does need an MRI of the brain to complete her metastatic work-up.  She also needs pulmonary function testing with and without bronchodilators to ensure that she has sufficient pulmonary reserve to tolerate a lobectomy.  I did offer to refer her to an oncologist in [place] to get a second opinion.  She does not want to do that at this time.  We did briefly discuss what surgery would entail.  I informed them of the plan to attempt robotic surgery but there is a possibility she might need a thoracotomy if she has adhesions from her recent CABG.  I informed them of the indications, risks, benefits, and alternatives.  They understand there is risk of death, MI, injury to grafts, bleeding, possible need for transfusion, infection, as well as other significant complications.  She wishes to think over her options.  She is not sure she wants to proceed with surgery.  Plan: Will obtain MRI and PFTs Return in 2 to 3 weeks after studies to discuss treatment.  Melrose Nakayama, MD [facility] [phone]

## 2020-10-05 ENCOUNTER — Other Ambulatory Visit: Payer: Self-pay | Admitting: *Deleted

## 2020-10-05 NOTE — Progress Notes (Signed)
The proposed treatment discussed in conference is for discussion purpose only and is not a binding recommendation. The patient was not been physically examined, or presented with their treatment options. Therefore, final treatment plans cannot be decided.  

## 2020-10-06 ENCOUNTER — Encounter: Payer: Self-pay | Admitting: Physician Assistant

## 2020-10-06 ENCOUNTER — Other Ambulatory Visit: Payer: Self-pay

## 2020-10-06 ENCOUNTER — Ambulatory Visit (INDEPENDENT_AMBULATORY_CARE_PROVIDER_SITE_OTHER): Payer: Medicare Other | Admitting: Physician Assistant

## 2020-10-06 VITALS — BP 126/70 | HR 72 | Ht 60.0 in | Wt 118.4 lb

## 2020-10-06 DIAGNOSIS — E785 Hyperlipidemia, unspecified: Secondary | ICD-10-CM

## 2020-10-06 DIAGNOSIS — I10 Essential (primary) hypertension: Secondary | ICD-10-CM

## 2020-10-06 DIAGNOSIS — Z951 Presence of aortocoronary bypass graft: Secondary | ICD-10-CM

## 2020-10-06 DIAGNOSIS — I739 Peripheral vascular disease, unspecified: Secondary | ICD-10-CM

## 2020-10-06 DIAGNOSIS — I5023 Acute on chronic systolic (congestive) heart failure: Secondary | ICD-10-CM | POA: Diagnosis not present

## 2020-10-06 DIAGNOSIS — I252 Old myocardial infarction: Secondary | ICD-10-CM

## 2020-10-06 DIAGNOSIS — R918 Other nonspecific abnormal finding of lung field: Secondary | ICD-10-CM

## 2020-10-06 DIAGNOSIS — I5020 Unspecified systolic (congestive) heart failure: Secondary | ICD-10-CM

## 2020-10-06 DIAGNOSIS — I2510 Atherosclerotic heart disease of native coronary artery without angina pectoris: Secondary | ICD-10-CM | POA: Diagnosis not present

## 2020-10-06 DIAGNOSIS — I447 Left bundle-branch block, unspecified: Secondary | ICD-10-CM

## 2020-10-06 DIAGNOSIS — I6523 Occlusion and stenosis of bilateral carotid arteries: Secondary | ICD-10-CM

## 2020-10-06 DIAGNOSIS — I493 Ventricular premature depolarization: Secondary | ICD-10-CM

## 2020-10-06 NOTE — Patient Instructions (Signed)
Medication Instructions:  Your physician recommends that you continue on your current medications as directed. Please refer to the Current Medication list given to you today.  *If you need a refill on your cardiac medications before your next appointment, please call your pharmacy*   Lab Work: None ordered   If you have labs (blood work) drawn today and your tests are completely normal, you will receive your results only by: [place] (if you have MyChart) OR A paper copy in the mail If you have any lab test that is abnormal or we need to change your treatment, we will call you to review the results.   Testing/Procedures: Your physician has requested that you have an echocardiogram. Echocardiography is a painless test that uses sound waves to create images of your heart. It provides your doctor with information about the size and shape of your heart and how well your heart's chambers and valves are working. This procedure takes approximately one hour. There are no restrictions for this procedure.    Follow-Up: At [facility], you and your health needs are our priority.  As part of our continuing mission to provide you with exceptional heart care, we have created designated Provider Care Teams.  These Care Teams include your primary Cardiologist (physician) and Advanced Practice Providers (APPs -  Physician Assistants and Nurse Practitioners) who all work together to provide you with the care you need, when you need it.  We recommend signing up for the patient portal called "MyChart".  Sign up information is provided on this After Visit Summary.  MyChart is used to connect with patients for Virtual Visits (Telemedicine).  Patients are able to view lab/test results, encounter notes, upcoming appointments, etc.  Non-urgent messages can be sent to your provider as well.   To learn more about what you can do with MyChart, go to [email or web address].    Your next appointment:   4  month(s)  The format for your next appointment:   In Person  Provider:   You may see or one of the following Advanced Practice Providers on your designated Care Team:   Murray Hodgkins, NP Christell Faith, PA-C Marrianne Mood, PA-C Cadence Kathlen Mody, [place]   Other Instructions None

## 2020-10-06 NOTE — Progress Notes (Signed)
Office Visit    Patient Name: [name] Date of Encounter: 10/06/2020  PCP:  Derinda Late, MD   [place]  Cardiologist: Dr. Garen Lah Advanced Practice Provider:  No care team member to display Electrophysiologist:  None :[id number]}   Chief Complaint    Chief Complaint  Patient presents with   Other    2 month f/u no complaints today. Meds reviewed verbally with pt.   80 y.o. female with history of CAD and CABG, NSTEMI, LBBB, peripheral vascular disease s/p intervention of the left common iliac artery in 2000, left-sided breast cancer s/p lumpectomy and radiation, hypertension, hyperlipidemia, prior tobacco use at 1 pack/day with quit date over 20 years prior, and seen for 2 mo follow-up.  Past Medical History    Past Medical History:  Diagnosis Date   Arthritis    Breast cancer ([place]) 2011   radiation- Left   CHF (congestive heart failure) (HCC)    Coronary artery disease    Hyperlipidemia    Hypertension    Myocardial infarction [facility])    Personal history of radiation therapy    PVD (peripheral vascular disease) ([place])    Past Surgical History:  Procedure Laterality Date   ABDOMINAL HYSTERECTOMY     APPENDECTOMY     BREAST BIOPSY Left 12/21/2009   positive, radiation dcis   BREAST EXCISIONAL BIOPSY Left 01/17/2010   lumpectomy DCIS   BREAST LUMPECTOMY     CATARACT EXTRACTION W/ INTRAOCULAR LENS IMPLANT & ANTERIOR VITRECTOMY, BILATERAL     CHOLECYSTECTOMY     COLONOSCOPY WITH PROPOFOL N/A 09/25/2017   Procedure: COLONOSCOPY WITH PROPOFOL;  Surgeon: Lollie Sails, MD;  Location: [facility] ENDOSCOPY;  Service: Endoscopy;  Laterality: N/A;   CORONARY ARTERY BYPASS GRAFT N/A 05/03/2020   Procedure: CORONARY ARTERY BYPASS GRAFTING (CABG) X FOUR ON PUMP USING LEFT INTERNAL MAMMARY ARTERY AND RIGHT ENDOSCOPIC SAPHEOUS VEIN HARVEST CONDUITS;  Surgeon: Melrose Nakayama, MD;  Location: [place];  Service: Open Heart Surgery;   Laterality: N/A;   DILATION AND CURETTAGE OF UTERUS     JOINT REPLACEMENT Right 01/24/2016   KNEE ARTHROPLASTY Right 01/24/2016   Procedure: COMPUTER ASSISTED TOTAL KNEE ARTHROPLASTY;  Surgeon: Dereck Leep, MD;  Location: [facility] ORS;  Service: Orthopedics;  Laterality: Right;   LEFT HEART CATH AND CORONARY ANGIOGRAPHY N/A 05/01/2020   Procedure: LEFT HEART CATH AND CORONARY ANGIOGRAPHY;  Surgeon: Wellington Hampshire, MD;  Location: [place] CV LAB;  Service: Cardiovascular;  Laterality: N/A;   RIGHT/LEFT HEART CATH AND CORONARY/GRAFT ANGIOGRAPHY N/A 07/27/2020   Procedure: RIGHT/LEFT HEART CATH AND CORONARY/GRAFT ANGIOGRAPHY;  Surgeon: Wellington Hampshire, MD;  Location: [place] CV LAB;  Service: Cardiovascular;  Laterality: N/A;   stent in Lt leg Left    TEE WITHOUT CARDIOVERSION N/A 05/03/2020   Procedure: TRANSESOPHAGEAL ECHOCARDIOGRAM (TEE);  Surgeon: Melrose Nakayama, MD;  Location: [place];  Service: Open Heart Surgery;  Laterality: N/A;   VIDEO BRONCHOSCOPY WITH ENDOBRONCHIAL NAVIGATION N/A 09/25/2020   Procedure: VIDEO BRONCHOSCOPY WITH ENDOBRONCHIAL NAVIGATION;  Surgeon: Melrose Nakayama, MD;  Location: [facility] OR;  Service: Thoracic;  Laterality: N/A;    Allergies  Allergies  Allergen Reactions   Ibuprofen Hives   Penicillins Hives    Tolerated Ancef in the past   Aleve [Naproxen Sodium] Hives   Avapro  [Irbesartan] Rash    Other reaction(s): Weal   Azithromycin Hives   Nickel Itching    Redness, itching and fluid discharge with  nickel earrings   Other Other (See Comments)    Nickel"ears ran fluid and itching" redness and "sensitive" Nickel"ears ran fluid and itching"    History of Present Illness    [name] is a 80 y.o. female with PMH as above.  Past history includes PVD with history of stenting of left common iliac artery in 2000, left-sided breast cancer s/p lumpectomy and radiation, hypertension, hyperlipidemia, prior tobacco use at 1 pack/day and  quitting over 20 years prior, and recent admission with new left bundle branch block and NSTEMI with catheterization and CABG as below.  Prior to most recent admission, she did not have a history of cardiovascular disease to her knowledge.  She was not following with a cardiologist.  On 04/30/2020, she presented to [facility] with report of a several week history of progressive exertional angina. CTA with left lower lobe surrounding an apparent 3.3 x 2.5 x 2.5 cm hypoenhancing lesion, as well as small left and trace right pleural effusions.  She underwent LHC 4/18 with severe calcification of the left main and three-vessel CAD as outlined below.  Significant pressure dampening was noted with catheter engagement of left main coronary artery.  She had moderately reduced LVSF with EF 35 to 40% by LV gram and mildly elevated LVEDP.  Echo EF 40 to 45% with global hypokinesis and moderate mitral regurgitation.   On 4/20, she underwent CABG with LIMA to LAD, SVG to PDA, and sequential OM, SVG to OM1.    Seen  06/22/20 and walking around her backyard several times and until feeling tired.  She was looking into her lung mass. She had a desire to come off of all her medications.  She declined all recommended changes and treatment discussed during her visit, including diuresis and escalation of GDMT. She asked to come off current medications as she did not feel them helpful. Preference was for less frequent cardiac visits.  Admitted 07/2020 with SOB and overloaded with COVID19 positive status.Echo showed EF 20% with global hypokinesis. Catheterization performed with results as below showing severe underlying LM and 3V CAD with patent grafts. Severe PAD noted. Elevated filling pressures and mildly reduced CO noted. IV diuresis and optimization of medications recommended.  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  During her bronchoscopy, she had some  episodes of hemoptysis but nothing concerning on review of previous documentation.  Options for treatment were discussed with surgery encouraged.  MRI of the brain and PFTs was recommended.  She was referred to an oncologist in [place] for second opinion with patient declining this referral.  Preference was indicated to think over her options, she was not sure if she wanted to proceed with surgery.  Today, 10/06/2020, she returns to clinic and reports ongoing work-up for her lung cancer. She attributes most of her sx due to her lungs. She reports hemoptysis, ongoing since her bronchoscopy though improving on a day-to-day basis.  She declines any chest pain or rather recurrent left-sided twinge.  She feels her lung CA may have been the reason for her CP. She notes her breathing is stable though she will get some DOE that she states does not limit her activity.  She is working out in the yard doing [employer] eating and hedge trimming without any exertional symptoms. She declines incr GDMT or cardiac rehab. BP at home 116/70 and 120/60.  She reports that she was informed she would need to  hold her Plavix for 5 days for any surgery.  She is still sleeping in a recliner, which she notes is out of habit not due to orthopnea.  She still reports a regular sleeping hours.  She denies any signs or symptoms of volume overload. She continues to indicate a desire to avoid any medications added to her current regimen.   Home Medications   Current Outpatient Medications  Medication Instructions   aspirin EC 81 mg, Oral, Daily   atorvastatin (LIPITOR) 20 mg, Oral, Nightly   Calcium Carbonate-Vit D-Min (CALTRATE 600+D PLUS PO) 1 tablet, Oral, Daily   clopidogrel (PLAVIX) 75 mg, Oral, Daily   furosemide (LASIX) 40 mg, Oral, Daily   metoprolol succinate (TOPROL-XL) 50 mg, Oral, Daily, Take with or immediately following a meal.   Multiple Vitamin (MULTIVITAMIN WITH MINERALS) TABS tablet 1 tablet, Oral, Daily    nitroGLYCERIN (NITROSTAT) 0.4 mg, Sublingual, Every 5 min PRN   potassium chloride SA (KLOR-CON) 10 MEQ tablet 10 mEq, Oral, Daily   sacubitril-valsartan (ENTRESTO) 24-26 MG 1 tablet, Oral, 2 times daily     Review of Systems    She reports resolution of her previous left-sided twinge and resolution of previous edema of her right lower extremity incision site.  She continues to note poor sleep hygiene, sleeping at odd hours and in a recliner to feel close to her husband (not due to orthopnea), though she is sleeping better when compared with previous clinic visits.  She notes frequent urination.  She reports hemoptysis since her bronchoscopy, improving since that time.  No, palpitations, pnd, v, presyncope, syncope, weight gain, or early satiety. All other systems reviewed and are otherwise negative except as noted above.  Physical Exam    VS:  BP 126/70 (BP Location: Right Arm, Patient Position: Sitting, Cuff Size: Normal)   Pulse 72   Ht 5' (1.524 m)   Wt 118 lb 6.4 oz (53.7 kg)   SpO2 99%   BMI 23.12 kg/m  , BMI Body mass index is 23.12 kg/m. GEN: Elderly female, no acute distress.  Mask in place. HEENT: normal. Neck: Supple, no JVD, carotid bruits, or masses. Cardiac: RRR, 1/6 systolic murmur LLSB.  No rubs, or gallops. No clubbing, cyanosis.    No bilateral lower extremity edema. Respiratory:  Respirations regular and unlabored. Expiratory wheeze, worse on left lower lobe. GI: Soft, nontender, nondistended, BS + x 4. MS: no deformity or atrophy.   Skin: warm and dry, no rash. Neuro:  Strength and sensation are intact. Psych: Normal affect.  Accessory Clinical Findings    ECG personally reviewed by me today - NSR,72 bpm, left bundle branch block, poor R wave in III, avF, T wave inversion noted in 1, 2, V5-V6, Qtc 507- no acute changes.  VITALS Reviewed today   Temp Readings from Last 3 Encounters:  09/25/20 98.4 F (36.9 C)  09/21/20 97.7 F (36.5 C) (Oral)  07/28/20 97.6  F (36.4 C) (Oral)   BP Readings from Last 3 Encounters:  10/06/20 126/70  10/04/20 130/68  09/25/20 (!) 132/59   Pulse Readings from Last 3 Encounters:  10/06/20 72  10/04/20 89  09/25/20 96    Wt Readings from Last 3 Encounters:  10/06/20 118 lb 6.4 oz (53.7 kg)  10/04/20 116 lb (52.6 kg)  09/25/20 116 lb (52.6 kg)     LABS  reviewed today   Lab Results  Component Value Date   WBC 5.2 09/21/2020   HGB 11.7 (L) 09/21/2020   HCT  35.2 (L) 09/21/2020   MCV 98.3 09/21/2020   PLT 186 09/21/2020   Lab Results  Component Value Date   CREATININE 0.83 09/21/2020   BUN 11 09/21/2020   NA 137 09/21/2020   K 3.0 (L) 09/21/2020   CL 103 09/21/2020   CO2 26 09/21/2020   Lab Results  Component Value Date   ALT 12 09/21/2020   AST 21 09/21/2020   ALKPHOS 95 09/21/2020   BILITOT 0.7 09/21/2020   Lab Results  Component Value Date   CHOL 99 05/01/2020   HDL 29 (L) 05/01/2020   LDLCALC 32 05/01/2020   TRIG 190 (H) 05/01/2020   CHOLHDL 3.4 05/01/2020    Lab Results  Component Value Date   HGBA1C 5.6 05/03/2020   Lab Results  Component Value Date   TSH 2.469 05/01/2020     STUDIES/PROCEDURES reviewed today   Cath 07/2020   Ost LAD to Prox LAD lesion is 80% stenosed.   Ost Cx to Prox Cx lesion is 80% stenosed.   Ost RCA to Prox RCA lesion is 60% stenosed.   Mid RCA lesion is 100% stenosed.   Ost LM to Dist LM lesion is 90% stenosed.   SVG graft was visualized by angiography and is normal in caliber.   SVG graft was visualized by angiography and is normal in caliber.   LIMA graft was visualized by non-selective angiography and is normal in caliber.   The graft exhibits no disease.   The graft exhibits no disease.   The graft exhibits no disease. 1.  Severe underlying left main and three-vessel coronary artery disease with patent grafts including LIMA to LAD, SVG to OM1 and SVG to right PDA/OM. 2.  Left ventricular angiography was not performed.  EF was severely  reduced by echo. 3.  Significant peripheral arterial disease with heavy calcified vessels.  There is evidence of moderate to severe disease affecting the right common femoral artery. 4.  Right heart catheterization showed moderately elevated filling pressures, mild pulmonary hypertension and mildly reduced cardiac output. 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  Her grafts are patent.  Continue medical therapy for coronary artery disease. She is still volume overloaded and thus recommending continuing IV diuresis.  Optimize heart failure medications.   Echo 07/2020 1. Left ventricular ejection fraction, by estimation, is 20 to 25%. The  left ventricle has severely decreased function. The left ventricle  demonstrates global hypokinesis. The left ventricular internal cavity size  was mildly to moderately dilated. Left  ventricular diastolic parameters were normal.   2. Right ventricular systolic function is normal. The right ventricular  size is mildly enlarged.   3. Left atrial size was mildly dilated.   4. Right atrial size was mildly dilated.   5. Moderate pleural effusion in the left lateral region.   6. The mitral valve is grossly normal. Moderate mitral valve  regurgitation.   7. The aortic valve is grossly normal. Aortic valve regurgitation is  trivial.   Intraoperative TEE during CABG 1/94/1740 Complications: No known complications during this procedure.  POST-OP IMPRESSIONS  - Left Ventricle: The left ventricle is unchanged from pre-bypass. The  cavity  size was normal.  - Right Ventricle: The right ventricle appears unchanged from pre-bypass.  - Aorta: The aorta appears unchanged from pre-bypass.  - Left Atrium: The left atrium appears unchanged from pre-bypass.  - Left Atrial Appendage: The left atrial appendage appears unchanged from  pre-bypass.  - Aortic Valve: The aortic  valve appears unchanged from pre-bypass.  - Mitral  Valve: There is mild regurgitation.  - Tricuspid Valve: The tricuspid valve appears unchanged from pre-bypass.  - Pulmonic Valve: The pulmonic valve appears unchanged from pre-bypass.  - Interatrial Septum: The interatrial septum appears unchanged from  pre-bypass.  - Interventricular Septum: The interventricular septum appears unchanged  from  pre-bypass.  - Pericardium: The pericardium appears unchanged from pre-bypass.  - Comments: Post-bypass images reviewed with surgeon.   Bilateral carotids 05/02/2020 Summary:  Right Carotid: Velocities in the right ICA are consistent with a 1-39%  stenosis.  Left Carotid: Velocities in the left ICA are consistent with a 1-39%  stenosis.  Vertebrals: Bilateral vertebral arteries demonstrate antegrade flow.  Right Upper Extremity: Doppler waveforms remain within normal limits with  right radial compression. Doppler waveforms remain within normal limits  with right ulnar compression.  Left Upper Extremity: Doppler waveforms remain within normal limits with  left radial compression. Doppler waveforms decrease 50% with left ulnar  compression.      LHC 05/01/2020: There is moderate left ventricular systolic dysfunction. LV end diastolic pressure is mildly elevated. The left ventricular ejection fraction is 35-45% by visual estimate. Ost RCA to Prox RCA lesion is 60% stenosed. Mid RCA to Dist RCA lesion is 100% stenosed. Ost LM to Dist LM lesion is 80% stenosed. Ost Cx to Prox Cx lesion is 80% stenosed. Ost LAD to Prox LAD lesion is 80% stenosed.   1.  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  Significant pressure dampening with catheter engagement of the left main coronary artery. 2.  Moderately reduced LV systolic function.  Mildly elevated left ventricular end-diastolic pressure.   Recommendations: Recommend transfer to [facility] for CABG evaluation. Obtain an echocardiogram to better evaluate LV systolic function  and valvular abnormalities. Resume heparin 6 hours after sheath pull.     Diagnostic Dominance: Right   Left Main  Ost LM to Dist LM lesion is 80% stenosed. The lesion is severely calcified.  Left Anterior Descending  Ost LAD to Prox LAD lesion is 80% stenosed. The lesion is eccentric. The lesion is severely calcified.  Left Circumflex  Ost Cx to Prox Cx lesion is 80% stenosed. The lesion is severely calcified.  Right Coronary Artery  Ost RCA to Prox RCA lesion is 60% stenosed. The lesion is severely calcified.  Mid RCA to Dist RCA lesion is 100% stenosed.  Right Posterior Atrioventricular Artery  Collaterals  RPAV filled by collaterals from RV Branch.     First Right Posterolateral Branch  Collaterals  1st RPL filled by collaterals from Dist LAD.       Intervention     No interventions have been documented.   Wall Motion        Resting                     Left Heart   Left Ventricle The left ventricular size is normal. There is moderate left ventricular systolic dysfunction. LV end diastolic pressure is mildly elevated. The left ventricular ejection fraction is 35-45% by visual estimate. There are LV function abnormalities due to global hypokinesis.    Coronary Diagrams     Diagnostic Dominance: Right      Echo 05/02/20  1. Left ventricular ejection fraction, by estimation, is 40 to 45%. The  left ventricle has mildly decreased function. The left ventricle  demonstrates global hypokinesis. There is mild concentric left ventricular  hypertrophy. Indeterminate diastolic  filling due  to E-A fusion. Elevated left ventricular end-diastolic  pressure.   2. Right ventricular systolic function is normal. The right ventricular  size is normal. There is mildly elevated pulmonary artery systolic  pressure. The estimated right ventricular systolic pressure is 15.8 mmHg.   3. Left atrial size was moderately dilated.   4. The mitral valve is normal in structure.  Moderate mitral valve  regurgitation.   5. The aortic valve is grossly normal. Aortic valve regurgitation is not  visualized.   6. The inferior vena cava is normal in size with greater than 50%  respiratory variability, suggesting right atrial pressure of 3 mmHg.    Assessment & Plan    Coronary artery disease without angina Recent NSTEMI s/p catheterization with severe 3v CAD S/p 4/20 CABG (LIMA to LAD, SVG to PDA, sequential OM SVG to OM1) --No recent sx concerning for angina.  3v CAD with 4/20 CABG.  EF 40 to 45%, LV global hypokinesis, concentric LVH, elevated LVEDP, elevated RVSP, mildly elevated PASP. Ongoing  desire to come off of all medications. Previously declined all medication changes and indicates preference to focus on lung CA at this time. Ongoing preference to avoid medication changes and declines escalation of GDMT. Continue DAPT with ASA and Plavix, Toprol, Entresto, statin.  As defers escalation of GDMT, addition of spiro +/- SGLT2i to be discussed again at RTC.  Aggressive risk factor modification discussed/CAD education provided.  Cardiac rehab declined.  Lung mass seen on CT --Continue with CTS regarding her mass. She is thinking about surgery.  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  07/2020 echo with EF reduced and pt preference to repeat echo ASAP as above as she does not wish to escalate GDMT. Continue to recommend addition of spironolactone, addition of SGLT2 inhibitor, deferred for now given preference to focus on lung CA and repeat echo.   Mitral regurgitation, moderate by 05/02/2020 echo -- Continue to monitor valvular disease with periodic echo.    HLD, LDL goal below 70 Hypertriglyceridemia -- LDL 32 and at goal of below 70.  Continue statin.  HTN, goal BP less than 130/80 -- BP improved.  Reassess if agreeable to start spironolactone at RTC.    H/o PVCs/tachycardia --Continue  current medications.    PVD  S/p left common iliac artery stenting Mild bilateral carotid artery disease Left upper extremity decreased waveforms with ulnar compression --Mild carotid dz on recent carotids 05/02/2020. S/p intervention to L common iliac artery with recommendation she follow-up with vascular surgery as recommended. Recent cath notes PAD, including femoral artery. Risk factor modification recommended with control of LDL, Tg,  glucose.  Continue DAPT and statin.   Disposition: RTC after echo and reassess if open to escalation of GDMT at that time  *Please be aware that the above documentation was completed voice recognition software and may contain dictation errors.     Arvil Chaco, PA-C 10/06/2020

## 2020-10-11 ENCOUNTER — Ambulatory Visit: Payer: Medicare Other | Attending: Family | Admitting: Family

## 2020-10-11 ENCOUNTER — Other Ambulatory Visit: Payer: Self-pay

## 2020-10-11 ENCOUNTER — Encounter: Payer: Self-pay | Admitting: Family

## 2020-10-11 VITALS — BP 143/57 | HR 81 | Resp 16 | Ht 63.0 in | Wt 117.5 lb

## 2020-10-11 DIAGNOSIS — I739 Peripheral vascular disease, unspecified: Secondary | ICD-10-CM | POA: Insufficient documentation

## 2020-10-11 DIAGNOSIS — Z951 Presence of aortocoronary bypass graft: Secondary | ICD-10-CM | POA: Diagnosis not present

## 2020-10-11 DIAGNOSIS — Z7982 Long term (current) use of aspirin: Secondary | ICD-10-CM | POA: Insufficient documentation

## 2020-10-11 DIAGNOSIS — Z881 Allergy status to other antibiotic agents status: Secondary | ICD-10-CM | POA: Diagnosis not present

## 2020-10-11 DIAGNOSIS — R918 Other nonspecific abnormal finding of lung field: Secondary | ICD-10-CM

## 2020-10-11 DIAGNOSIS — I10 Essential (primary) hypertension: Secondary | ICD-10-CM | POA: Diagnosis not present

## 2020-10-11 DIAGNOSIS — Z880 Allergy status to penicillin: Secondary | ICD-10-CM | POA: Insufficient documentation

## 2020-10-11 DIAGNOSIS — Z87891 Personal history of nicotine dependence: Secondary | ICD-10-CM | POA: Diagnosis not present

## 2020-10-11 DIAGNOSIS — Z79899 Other long term (current) drug therapy: Secondary | ICD-10-CM | POA: Insufficient documentation

## 2020-10-11 DIAGNOSIS — Z7902 Long term (current) use of antithrombotics/antiplatelets: Secondary | ICD-10-CM | POA: Diagnosis not present

## 2020-10-11 DIAGNOSIS — Z8616 Personal history of COVID-19: Secondary | ICD-10-CM | POA: Diagnosis not present

## 2020-10-11 DIAGNOSIS — I110 Hypertensive heart disease with heart failure: Secondary | ICD-10-CM | POA: Diagnosis not present

## 2020-10-11 DIAGNOSIS — I5022 Chronic systolic (congestive) heart failure: Secondary | ICD-10-CM | POA: Diagnosis present

## 2020-10-11 DIAGNOSIS — E785 Hyperlipidemia, unspecified: Secondary | ICD-10-CM | POA: Diagnosis not present

## 2020-10-11 DIAGNOSIS — Z853 Personal history of malignant neoplasm of breast: Secondary | ICD-10-CM | POA: Diagnosis not present

## 2020-10-11 DIAGNOSIS — I2510 Atherosclerotic heart disease of native coronary artery without angina pectoris: Secondary | ICD-10-CM | POA: Diagnosis not present

## 2020-10-11 DIAGNOSIS — Z886 Allergy status to analgesic agent status: Secondary | ICD-10-CM | POA: Diagnosis not present

## 2020-10-11 DIAGNOSIS — Z8249 Family history of ischemic heart disease and other diseases of the circulatory system: Secondary | ICD-10-CM | POA: Insufficient documentation

## 2020-10-11 NOTE — Progress Notes (Signed)
Patient ID: [name], female    DOB: 29-May-1940, 80 y.o.   MRN: [id number]  Congestive Heart Failure Associated symptoms include fatigue (minimal). Pertinent negatives include no abdominal pain, chest pain, palpitations or shortness of breath.   [name] is a 80 y/o female with a history of breast cancer, HTN, PVD, hyperlipidemia, CAD, previous tobacco use and chronic heart failure.   Echo report from 07/25/20 reviewed and showed an EF of 20-25% along with moderate MR.   RHC/LHC done 07/27/20 showed: Ost LAD to Prox LAD lesion is 80% stenosed.   Ost Cx to Prox Cx lesion is 80% stenosed.   Ost RCA to Prox RCA lesion is 60% stenosed.   Mid RCA lesion is 100% stenosed.   Ost LM to Dist LM lesion is 90% stenosed.   SVG graft was visualized by angiography and is normal in caliber.   SVG graft was visualized by angiography and is normal in caliber.   LIMA graft was visualized by non-selective angiography and is normal in caliber.   The graft exhibits no disease.   The graft exhibits no disease.   The graft exhibits no disease.   1.  Severe underlying left main and three-vessel coronary artery disease with patent grafts including LIMA to LAD, SVG to OM1 and SVG to right PDA/OM. 2.  Left ventricular angiography was not performed.  EF was severely reduced by echo. 3.  Significant peripheral arterial disease with heavy calcified vessels.  There is evidence of moderate to severe disease affecting the right common femoral artery. 4.  Right heart catheterization showed moderately elevated filling pressures, mild pulmonary hypertension and mildly reduced cardiac output.  Admitted 07/24/20 due to HF exacerbation. Initially given IV lasix with transition to oral diuretics. Cardiology and ID consults obtained. Covid +. Cath completed. Discharged after 4 days.   She presents today for follow up visit with a chief complaint of minimal fatigue upon moderate exertion. She describes this as having been  present for several months and that it slowly getting better. She has associated right foot swelling and chronic difficulty sleeping along with this. She denies any dizziness, abdominal distention, palpitations, chest pain, shortness of breath, cough or weight gain.   She has seen CVTS for follow up of a lung mass, biopsy revealed NSCC 2A. She is weighing her options for a lobectomy vs medical management.   Past Medical History:  Diagnosis Date   Arthritis    Breast cancer ([place]) 2011   radiation- Left   CHF (congestive heart failure) (HCC)    Coronary artery disease    Hyperlipidemia    Hypertension    Myocardial infarction [facility])    Personal history of radiation therapy    PVD (peripheral vascular disease) ([place])    Past Surgical History:  Procedure Laterality Date   ABDOMINAL HYSTERECTOMY     APPENDECTOMY     BREAST BIOPSY Left 12/21/2009   positive, radiation dcis   BREAST EXCISIONAL BIOPSY Left 01/17/2010   lumpectomy DCIS   BREAST LUMPECTOMY     CATARACT EXTRACTION W/ INTRAOCULAR LENS IMPLANT & ANTERIOR VITRECTOMY, BILATERAL     CHOLECYSTECTOMY     COLONOSCOPY WITH PROPOFOL N/A 09/25/2017   Procedure: COLONOSCOPY WITH PROPOFOL;  Surgeon: Lollie Sails, MD;  Location: [facility] ENDOSCOPY;  Service: Endoscopy;  Laterality: N/A;   CORONARY ARTERY BYPASS GRAFT N/A 05/03/2020   Procedure: CORONARY ARTERY BYPASS GRAFTING (CABG) X FOUR ON PUMP USING LEFT INTERNAL MAMMARY ARTERY AND RIGHT ENDOSCOPIC SAPHEOUS VEIN  HARVEST CONDUITS;  Surgeon: Melrose Nakayama, MD;  Location: [place];  Service: Open Heart Surgery;  Laterality: N/A;   DILATION AND CURETTAGE OF UTERUS     JOINT REPLACEMENT Right 01/24/2016   KNEE ARTHROPLASTY Right 01/24/2016   Procedure: COMPUTER ASSISTED TOTAL KNEE ARTHROPLASTY;  Surgeon: Dereck Leep, MD;  Location: [facility] ORS;  Service: Orthopedics;  Laterality: Right;   LEFT HEART CATH AND CORONARY ANGIOGRAPHY N/A 05/01/2020   Procedure: LEFT HEART CATH AND  CORONARY ANGIOGRAPHY;  Surgeon: Wellington Hampshire, MD;  Location: [place] CV LAB;  Service: Cardiovascular;  Laterality: N/A;   RIGHT/LEFT HEART CATH AND CORONARY/GRAFT ANGIOGRAPHY N/A 07/27/2020   Procedure: RIGHT/LEFT HEART CATH AND CORONARY/GRAFT ANGIOGRAPHY;  Surgeon: Wellington Hampshire, MD;  Location: [place] CV LAB;  Service: Cardiovascular;  Laterality: N/A;   stent in Lt leg Left    TEE WITHOUT CARDIOVERSION N/A 05/03/2020   Procedure: TRANSESOPHAGEAL ECHOCARDIOGRAM (TEE);  Surgeon: Melrose Nakayama, MD;  Location: [place];  Service: Open Heart Surgery;  Laterality: N/A;   VIDEO BRONCHOSCOPY WITH ENDOBRONCHIAL NAVIGATION N/A 09/25/2020   Procedure: VIDEO BRONCHOSCOPY WITH ENDOBRONCHIAL NAVIGATION;  Surgeon: Melrose Nakayama, MD;  Location: [facility] OR;  Service: Thoracic;  Laterality: N/A;   Family History  Problem Relation Age of Onset   Hypertension Mother    Arthritis Mother    Gout Mother    Lung cancer Father    Heart attack Sister    Social History   Tobacco Use   Smoking status: Former   Smokeless tobacco: Never  Substance Use Topics   Alcohol use: No    Alcohol/week: 0.0 standard drinks   Allergies  Allergen Reactions   Ibuprofen Hives   Penicillins Hives    Tolerated Ancef in the past   Aleve [Naproxen Sodium] Hives   Avapro  [Irbesartan] Rash    Other reaction(s): Weal   Azithromycin Hives   Nickel Itching    Redness, itching and fluid discharge with nickel earrings   Other Other (See Comments)    Nickel"ears ran fluid and itching" redness and "sensitive" Nickel"ears ran fluid and itching"   Prior to Admission medications   Medication Sig Start Date End Date Taking? Authorizing Provider  acetaminophen (TYLENOL) 500 MG tablet Take 1,000 mg by mouth every 6 (six) hours as needed for mild pain.   Yes [provider]  aspirin EC 81 MG tablet Take 81 mg by mouth daily.   Yes [provider]  atorvastatin (LIPITOR) 20 MG tablet  Take 1 tablet (20 mg total) by mouth at bedtime. 06/22/20  Yes Visser, Jacquelyn D, PA-C  Calcium Carbonate-Vit D-Min (CALTRATE 600+D PLUS PO) Take 1 tablet by mouth daily.    Yes [provider]  clopidogrel (PLAVIX) 75 MG tablet Take 1 tablet (75 mg total) by mouth daily. 05/22/20 05/17/21 Yes Visser, Jacquelyn D, PA-C  furosemide (LASIX) 40 MG tablet Take 1 tablet (40 mg total) by mouth daily. 07/29/20 08/28/20 Yes Dahal, Marlowe Aschoff, MD  metoprolol succinate (TOPROL-XL) 50 MG 24 hr tablet Take 1 tablet (50 mg total) by mouth daily. Take with or immediately following a meal. 07/28/20 08/27/20 Yes Dahal, Marlowe Aschoff, MD  nitroGLYCERIN (NITROSTAT) 0.4 MG SL tablet Place 1 tablet (0.4 mg total) under the tongue every 5 (five) minutes as needed for up to 10 days for chest pain. 07/28/20  Yes Dahal, Marlowe Aschoff, MD  potassium chloride SA (KLOR-CON) 10 MEQ tablet Take 1 tablet (10 mEq total) by mouth daily. 07/28/20 08/27/20 Yes  Terrilee Croak, MD  sacubitril-valsartan (ENTRESTO) 24-26 MG Take 1 tablet by mouth 2 (two) times daily. 07/28/20 08/27/20 Yes Dahal, Marlowe Aschoff, MD   Review of Systems  Constitutional:  Positive for fatigue (minimal). Negative for appetite change.  HENT:  Negative for congestion, postnasal drip and sore throat.   Eyes: Negative.   Respiratory:  Negative for cough, chest tightness and shortness of breath.   Cardiovascular:  Positive for leg swelling (right foot; resolves overnight; from harvest site). Negative for chest pain and palpitations.  Gastrointestinal:  Negative for abdominal distention and abdominal pain.  Endocrine: Negative.   Genitourinary: Negative.   Musculoskeletal:  Negative for back pain and neck pain.  Skin: Negative.   Allergic/Immunologic: Negative.   Neurological:  Negative for dizziness and light-headedness.  Hematological:  Negative for adenopathy. Bruises/bleeds easily.  Psychiatric/Behavioral:  Positive for sleep disturbance (difficulty sleeping; sleeping in recliner due  to comfort; chronic). Negative for dysphoric mood. The patient is not nervous/anxious.    Vitals:   10/11/20 1004  BP: (!) 143/57  Pulse: 81  Resp: 16  SpO2: 100%  Weight: 117 lb 8 oz (53.3 kg)  Height: 5\' 3"  (1.6 m)   Wt Readings from Last 3 Encounters:  10/11/20 117 lb 8 oz (53.3 kg)  10/06/20 118 lb 6.4 oz (53.7 kg)  10/04/20 116 lb (52.6 kg)   Lab Results  Component Value Date   CREATININE 0.83 09/21/2020   CREATININE 0.63 07/28/2020   CREATININE 0.79 07/27/2020     Physical Exam Vitals and nursing note reviewed. Exam conducted with a chaperone present (granddaughter).  Constitutional:      Appearance: Normal appearance.  HENT:     Head: Normocephalic and atraumatic.  Neck:     Vascular: No carotid bruit.     Comments: - JVD Cardiovascular:     Rate and Rhythm: Normal rate and regular rhythm.     Pulses: Normal pulses.     Heart sounds: Normal heart sounds. No murmur heard.   No gallop.  Pulmonary:     Effort: Pulmonary effort is normal. No respiratory distress.     Breath sounds: No wheezing or rales.  Abdominal:     General: There is no distension.     Palpations: Abdomen is soft.  Musculoskeletal:        General: No tenderness.     Cervical back: Normal range of motion and neck supple.     Right lower leg: Edema (trace edema to right ankle) present.     Left lower leg: No edema.  Skin:    General: Skin is warm and dry.  Neurological:     General: No focal deficit present.     Mental Status: She is alert and oriented to person, place, and time.  Psychiatric:        Mood and Affect: Mood normal.        Behavior: Behavior normal.        Thought Content: Thought content normal.        Judgment: Judgment normal.     Assessment & Plan:  1: Chronic heart failure with reduced ejection fraction- - NYHA class II - euvolemic today - weighing daily; reminded to call for an overnight weight gain of > 2 pounds or a weekly weight gain of > 5 pounds - weight  up 4 pounds from last visit here 2 months ago - not adding salt, although admits she would like to; has been reading food labels for sodium content; reviewed the  importance of keeping her daily sodium intake to 1500-2000mg / day - on GDMT of metoprolol and entresto - BP higher and would allow for titration, discussed with patient and she would like to manage her lung mass first before titrating her medications - BNP 07/24/20 was 745.6  2: HTN- - BP 143/57, at home she reports it is lower, typically 120/50-70s - saw cardiology Mickle Plumb) 10/06/20 - BMP 09/21/20 reviewed and showed sodium 137, potassium 3.0, creatinine 0.83 and GFR > 60  3: CAD- - CABG 04/2020 - saw CVTS Roxan Hockey) on 10/04/20  4: Lung Mass- - saw CVTS Roxan Hockey) on 10/04/20, 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  Return in 3 months or sooner for any questions/problems before then.

## 2020-10-11 NOTE — Patient Instructions (Signed)
Return in 3 months.

## 2020-10-14 HISTORY — PX: LUNG LOBECTOMY: SHX167

## 2020-10-19 ENCOUNTER — Other Ambulatory Visit: Payer: Self-pay

## 2020-10-19 ENCOUNTER — Ambulatory Visit
Admission: RE | Admit: 2020-10-19 | Discharge: 2020-10-19 | Disposition: A | Discharge status: Home / Self Care | Payer: Medicare Other | Source: Ambulatory Visit | Attending: Thoracic Surgery (Cardiothoracic Vascular Surgery) | Admitting: Thoracic Surgery (Cardiothoracic Vascular Surgery)

## 2020-10-19 DIAGNOSIS — C3490 Malignant neoplasm of unspecified part of unspecified bronchus or lung: Secondary | ICD-10-CM | POA: Diagnosis present

## 2020-10-19 DIAGNOSIS — C7931 Secondary malignant neoplasm of brain: Secondary | ICD-10-CM | POA: Diagnosis present

## 2020-10-19 MED ORDER — GADOBUTROL 1 MMOL/ML IV SOLN
5.0000 mL | Freq: Once | INTRAVENOUS | Status: AC | PRN
  Administered 2020-10-19: 5 mL via INTRAVENOUS

## 2020-10-24 ENCOUNTER — Ambulatory Visit: Payer: Medicare Other | Attending: Thoracic Surgery (Cardiothoracic Vascular Surgery)

## 2020-10-24 ENCOUNTER — Encounter: Payer: Medicare Other | Admitting: Thoracic Surgery (Cardiothoracic Vascular Surgery)

## 2020-10-24 ENCOUNTER — Other Ambulatory Visit: Payer: Self-pay | Admitting: *Deleted

## 2020-10-24 DIAGNOSIS — I509 Heart failure, unspecified: Secondary | ICD-10-CM

## 2020-10-24 DIAGNOSIS — R0602 Shortness of breath: Secondary | ICD-10-CM

## 2020-10-26 LAB — FUNGAL ORGANISM REFLEX

## 2020-10-26 LAB — FUNGUS CULTURE WITH STAIN

## 2020-10-26 LAB — FUNGUS CULTURE RESULT

## 2020-10-27 ENCOUNTER — Other Ambulatory Visit
Admission: RE | Admit: 2020-10-27 | Discharge: 2020-10-27 | Disposition: A | Discharge status: Home / Self Care | Payer: Medicare Other | Source: Ambulatory Visit | Attending: Family Medicine | Admitting: Family Medicine

## 2020-10-27 ENCOUNTER — Other Ambulatory Visit: Payer: Self-pay

## 2020-10-27 DIAGNOSIS — Z20822 Contact with and (suspected) exposure to covid-19: Secondary | ICD-10-CM | POA: Diagnosis not present

## 2020-10-27 DIAGNOSIS — Z01812 Encounter for preprocedural laboratory examination: Secondary | ICD-10-CM | POA: Insufficient documentation

## 2020-10-27 LAB — SARS CORONAVIRUS 2 (TAT 6-24 HRS): SARS Coronavirus 2: NEGATIVE

## 2020-10-30 ENCOUNTER — Other Ambulatory Visit: Payer: Self-pay

## 2020-10-30 ENCOUNTER — Ambulatory Visit: Payer: Medicare Other | Attending: Thoracic Surgery (Cardiothoracic Vascular Surgery)

## 2020-10-30 DIAGNOSIS — I509 Heart failure, unspecified: Secondary | ICD-10-CM | POA: Diagnosis not present

## 2020-10-30 DIAGNOSIS — R0602 Shortness of breath: Secondary | ICD-10-CM | POA: Diagnosis present

## 2020-10-30 MED ORDER — ALBUTEROL SULFATE (2.5 MG/3ML) 0.083% IN NEBU
2.5000 mg | INHALATION_SOLUTION | Freq: Once | RESPIRATORY_TRACT | Status: AC
  Administered 2020-10-30: 2.5 mg via RESPIRATORY_TRACT
  Filled 2020-10-30: qty 3

## 2020-10-31 ENCOUNTER — Encounter: Payer: Medicare Other | Admitting: Thoracic Surgery (Cardiothoracic Vascular Surgery)

## 2020-11-01 ENCOUNTER — Ambulatory Visit (INDEPENDENT_AMBULATORY_CARE_PROVIDER_SITE_OTHER): Payer: Medicare Other | Admitting: Thoracic Surgery (Cardiothoracic Vascular Surgery)

## 2020-11-01 ENCOUNTER — Other Ambulatory Visit: Payer: Self-pay

## 2020-11-01 VITALS — BP 115/70 | HR 94 | Resp 20 | Ht 60.0 in | Wt 117.0 lb

## 2020-11-01 DIAGNOSIS — I6523 Occlusion and stenosis of bilateral carotid arteries: Secondary | ICD-10-CM | POA: Diagnosis not present

## 2020-11-01 DIAGNOSIS — C3432 Malignant neoplasm of lower lobe, left bronchus or lung: Secondary | ICD-10-CM | POA: Diagnosis not present

## 2020-11-01 NOTE — Progress Notes (Signed)
[address]       [place] [address]             [phone]      HPI: Mrs. Bowler returns to discuss management of her left lower lobe squamous cell carcinoma.  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  She was found to have a left lower lobe lung mass at the time of her CABG back in April.  She underwent surgery because of her left main disease.  On a follow-up chest x-ray in May the lesion appeared slightly smaller and more cavitary.  We repeated a CT which showed the mass was approximately the same size.  A PET/CT showed the mass was markedly hypermetabolic without evidence of regional or distant metastatic disease.  Navigational bronchoscopy showed squamous cell carcinoma.  I advised her to undergo surgical resection.  Given the size of the tumor she will likely need adjuvant chemotherapy.  The alternative would be to treat with chemo and radiation primarily.  Her best chance of survival is with surgical resection.  She has been reluctant to have surgery but also refused a referral to radiation oncology.  She had an MR of the brain to complete her staging work-up and pulmonary function testing and now returns to discuss those results.  She is not having any anginal pain.  Past Medical History:  Diagnosis Date   Arthritis    Breast cancer ([place]) 2011   radiation- Left   CHF (congestive heart failure) (HCC)    Coronary artery disease    Hyperlipidemia    Hypertension    Myocardial infarction [facility])    Personal history of radiation therapy    PVD (peripheral vascular disease) (HCC)     Current Outpatient Medications  Medication Sig Dispense Refill   aspirin EC 81 MG tablet Take 81 mg by mouth daily.     atorvastatin (LIPITOR) 20 MG tablet Take 1  tablet (20 mg total) by mouth at bedtime. 90 tablet 3   Calcium Carbonate-Vit D-Min (CALTRATE 600+D PLUS PO) Take 1 tablet by mouth daily.      clopidogrel (PLAVIX) 75 MG tablet Take 1 tablet (75 mg total) by mouth daily. 90 tablet 3   furosemide (LASIX) 40 MG tablet Take 1 tablet (40 mg total) by mouth daily. 30 tablet 0   metoprolol succinate (TOPROL-XL) 50 MG 24 hr tablet Take 1 tablet (50 mg total) by mouth daily. Take with or immediately following a meal. 30 tablet 0   Multiple Vitamin (MULTIVITAMIN WITH MINERALS) TABS tablet Take 1 tablet by mouth daily.     nitroGLYCERIN (NITROSTAT) 0.4 MG SL tablet Place 1 tablet (0.4 mg total) under the tongue every 5 (five) minutes as needed for up to 10 days for chest pain. 10 tablet 0   potassium chloride SA (KLOR-CON) 10 MEQ tablet Take 1 tablet (10 mEq total) by mouth daily. 30 tablet 0   sacubitril-valsartan (ENTRESTO) 24-26 MG Take 1 tablet by mouth 2 (two) times daily. 60 tablet 0   No current facility-administered medications for this visit.    Physical Exam BP 115/70   Pulse 94   Resp 20   Ht 5' (1.524 m)   Wt 117 lb (53.1 kg)   SpO2 96%   BMI 22.65 kg/m  80 year old  woman in no acute distress Alert and oriented x3 with no focal deficits Cardiac regular rate and rhythm Lungs clear  Diagnostic Tests: Pulmonary function testing 10/30/2020 FVC 2.01 (90%) FEV1 1.52 (92%) No change with bronchodilator DLCO 9.18 (55%)  MR brain-no intracranial metastases  Impression: Aniyla Harling is a 80 year old former smoker who had coronary bypass grafting in April.  She was found to have a left lower lobe lung mass.  That is now biopsy-proven clinical stage IIB (T3, N0) squamous cell carcinoma.  The tumor is borderline for T2 versus T3.  In any event she will need multimodality therapy.  Her best chance of survival is with surgical resection.    I did discussed the proposed operation with her and her son.  We would plan to do this with a  robotic approach for left lower lobectomy.  They understand there is a possibility of need for conversion to thoracotomy.  We will definitely have to spread the ribs may even have to cut her rib to get the tumor out of the chest.  I informed them of the general nature of the procedure including the incisions to be used, the need for general anesthesia, the use of a drainage tube postoperatively, the expected hospital stay, and the overall recovery.  I informed her of the indications, risks, benefits, and alternatives.  She understands the risks include, but not limited to death, MI, DVT, PE, bleeding, possible need for transfusion, infection, prolonged air leak, cardiac arrhythmias, as well as possibility of other unforeseeable complications.  She remains undecided about surgery.  She wants to talk to her children.  I asked her to do that today and let us know tomorrow how she would like to proceed.  Plan: We will call patient to schedule surgery or consultation with oncology and radiation oncology whichever she prefers.  She will need to hold Plavix for 5 days prior to surgery if she chooses to proceed.  Melrose Nakayama, MD [facility] [phone]

## 2020-11-01 NOTE — H&P (View-Only) (Signed)
[address]       [place] [address]             [phone]      HPI: Mrs. Alfieri returns to discuss management of her left lower lobe squamous cell carcinoma.  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  She was found to have a left lower lobe lung mass at the time of her CABG back in April.  She underwent surgery because of her left main disease.  On a follow-up chest x-ray in May the lesion appeared slightly smaller and more cavitary.  We repeated a CT which showed the mass was approximately the same size.  A PET/CT showed the mass was markedly hypermetabolic without evidence of regional or distant metastatic disease.  Navigational bronchoscopy showed squamous cell carcinoma.  I advised her to undergo surgical resection.  Given the size of the tumor she will likely need adjuvant chemotherapy.  The alternative would be to treat with chemo and radiation primarily.  Her best chance of survival is with surgical resection.  She has been reluctant to have surgery but also refused a referral to radiation oncology.  She had an MR of the brain to complete her staging work-up and pulmonary function testing and now returns to discuss those results.  She is not having any anginal pain.  Past Medical History:  Diagnosis Date   Arthritis    Breast cancer ([place]) 2011   radiation- Left   CHF (congestive heart failure) (HCC)    Coronary artery disease    Hyperlipidemia    Hypertension    Myocardial infarction [facility])    Personal history of radiation therapy    PVD (peripheral vascular disease) (HCC)     Current Outpatient Medications  Medication Sig Dispense Refill   aspirin EC 81 MG tablet Take 81 mg by mouth daily.     atorvastatin (LIPITOR) 20 MG tablet Take 1  tablet (20 mg total) by mouth at bedtime. 90 tablet 3   Calcium Carbonate-Vit D-Min (CALTRATE 600+D PLUS PO) Take 1 tablet by mouth daily.      clopidogrel (PLAVIX) 75 MG tablet Take 1 tablet (75 mg total) by mouth daily. 90 tablet 3   furosemide (LASIX) 40 MG tablet Take 1 tablet (40 mg total) by mouth daily. 30 tablet 0   metoprolol succinate (TOPROL-XL) 50 MG 24 hr tablet Take 1 tablet (50 mg total) by mouth daily. Take with or immediately following a meal. 30 tablet 0   Multiple Vitamin (MULTIVITAMIN WITH MINERALS) TABS tablet Take 1 tablet by mouth daily.     nitroGLYCERIN (NITROSTAT) 0.4 MG SL tablet Place 1 tablet (0.4 mg total) under the tongue every 5 (five) minutes as needed for up to 10 days for chest pain. 10 tablet 0   potassium chloride SA (KLOR-CON) 10 MEQ tablet Take 1 tablet (10 mEq total) by mouth daily. 30 tablet 0   sacubitril-valsartan (ENTRESTO) 24-26 MG Take 1 tablet by mouth 2 (two) times daily. 60 tablet 0   No current facility-administered medications for this visit.    Physical Exam BP 115/70   Pulse 94   Resp 20   Ht 5' (1.524 m)   Wt 117 lb (53.1 kg)   SpO2 96%   BMI 22.14 kg/m  80 year old  woman in no acute distress Alert and oriented x3 with no focal deficits Cardiac regular rate and rhythm Lungs clear  Diagnostic Tests: Pulmonary function testing 10/30/2020 FVC 2.01 (90%) FEV1 1.52 (92%) No change with bronchodilator DLCO 9.18 (55%)  MR brain-no intracranial metastases  Impression: Ermie Glendenning is a 80 year old former smoker who had coronary bypass grafting in April.  She was found to have a left lower lobe lung mass.  That is now biopsy-proven clinical stage IIB (T3, N0) squamous cell carcinoma.  The tumor is borderline for T2 versus T3.  In any event she will need multimodality therapy.  Her best chance of survival is with surgical resection.    I did discussed the proposed operation with her and her son.  We would plan to do this with a  robotic approach for left lower lobectomy.  They understand there is a possibility of need for conversion to thoracotomy.  We will definitely have to spread the ribs may even have to cut her rib to get the tumor out of the chest.  I informed them of the general nature of the procedure including the incisions to be used, the need for general anesthesia, the use of a drainage tube postoperatively, the expected hospital stay, and the overall recovery.  I informed her of the indications, risks, benefits, and alternatives.  She understands the risks include, but not limited to death, MI, DVT, PE, bleeding, possible need for transfusion, infection, prolonged air leak, cardiac arrhythmias, as well as possibility of other unforeseeable complications.  She remains undecided about surgery.  She wants to talk to her children.  I asked her to do that today and let us know tomorrow how she would like to proceed.  Plan: We will call patient to schedule surgery or consultation with oncology and radiation oncology whichever she prefers.  She will need to hold Plavix for 5 days prior to surgery if she chooses to proceed.  Melrose Nakayama, MD [facility] [phone]

## 2020-11-02 ENCOUNTER — Encounter: Payer: Self-pay | Admitting: *Deleted

## 2020-11-02 ENCOUNTER — Other Ambulatory Visit: Payer: Self-pay | Admitting: *Deleted

## 2020-11-02 DIAGNOSIS — I5023 Acute on chronic systolic (congestive) heart failure: Secondary | ICD-10-CM

## 2020-11-02 DIAGNOSIS — I739 Peripheral vascular disease, unspecified: Secondary | ICD-10-CM

## 2020-11-02 DIAGNOSIS — C3432 Malignant neoplasm of lower lobe, left bronchus or lung: Secondary | ICD-10-CM

## 2020-11-03 ENCOUNTER — Telehealth: Payer: Self-pay | Admitting: Cardiology

## 2020-11-03 NOTE — Telephone Encounter (Signed)
Patient calling  Would like to clarify metoprolol instructions Please call to discuss

## 2020-11-03 NOTE — Telephone Encounter (Signed)
[name]   Fri Nov 03, 2020 10:58 AM  Pt in June was prescribed 50 mg bid. In July she had a hospitalization due to excessive fluid and this medicine was prescribed once daily (hospital doesn't prescribe refills, only given one 30 day supply). Pt's pharmacy has been refilling the old June bid rx and she has been taking it as written on her bottle. I instructed the pt to call her dr and get updated instructions.

## 2020-11-05 ENCOUNTER — Other Ambulatory Visit: Payer: Self-pay

## 2020-11-05 ENCOUNTER — Emergency Department: Payer: Medicare Other

## 2020-11-05 ENCOUNTER — Emergency Department
Admission: EM | Admit: 2020-11-05 | Discharge: 2020-11-05 | Disposition: A | Discharge status: Home / Self Care | Payer: Medicare Other | Attending: Emergency Medicine | Admitting: Emergency Medicine

## 2020-11-05 DIAGNOSIS — R0602 Shortness of breath: Secondary | ICD-10-CM | POA: Insufficient documentation

## 2020-11-05 DIAGNOSIS — R5383 Other fatigue: Secondary | ICD-10-CM | POA: Insufficient documentation

## 2020-11-05 DIAGNOSIS — C3412 Malignant neoplasm of upper lobe, left bronchus or lung: Secondary | ICD-10-CM | POA: Insufficient documentation

## 2020-11-05 DIAGNOSIS — I10 Essential (primary) hypertension: Secondary | ICD-10-CM | POA: Diagnosis not present

## 2020-11-05 DIAGNOSIS — Z20822 Contact with and (suspected) exposure to covid-19: Secondary | ICD-10-CM | POA: Insufficient documentation

## 2020-11-05 DIAGNOSIS — R5381 Other malaise: Secondary | ICD-10-CM

## 2020-11-05 DIAGNOSIS — Z87891 Personal history of nicotine dependence: Secondary | ICD-10-CM | POA: Insufficient documentation

## 2020-11-05 DIAGNOSIS — C3492 Malignant neoplasm of unspecified part of left bronchus or lung: Secondary | ICD-10-CM

## 2020-11-05 LAB — RESP PANEL BY RT-PCR (FLU A&B, COVID) ARPGX2
Influenza A by PCR: NEGATIVE
Influenza B by PCR: NEGATIVE
SARS Coronavirus 2 by RT PCR: NEGATIVE

## 2020-11-05 LAB — CBC WITH DIFFERENTIAL/PLATELET
Abs Immature Granulocytes: 0.02 10*3/uL (ref 0.00–0.07)
Basophils Absolute: 0 10*3/uL (ref 0.0–0.1)
Basophils Relative: 0 %
Eosinophils Absolute: 0.2 10*3/uL (ref 0.0–0.5)
Eosinophils Relative: 2 %
HCT: 33.4 % — ABNORMAL LOW (ref 36.0–46.0)
Hemoglobin: 11.5 g/dL — ABNORMAL LOW (ref 12.0–15.0)
Immature Granulocytes: 0 %
Lymphocytes Relative: 41 %
Lymphs Abs: 2.7 10*3/uL (ref 0.7–4.0)
MCH: 33.5 pg (ref 26.0–34.0)
MCHC: 34.4 g/dL (ref 30.0–36.0)
MCV: 97.4 fL (ref 80.0–100.0)
Monocytes Absolute: 0.6 10*3/uL (ref 0.1–1.0)
Monocytes Relative: 9 %
Neutro Abs: 3.1 10*3/uL (ref 1.7–7.7)
Neutrophils Relative %: 48 %
Platelets: 139 10*3/uL — ABNORMAL LOW (ref 150–400)
RBC: 3.43 MIL/uL — ABNORMAL LOW (ref 3.87–5.11)
RDW: 13.6 % (ref 11.5–15.5)
WBC: 6.6 10*3/uL (ref 4.0–10.5)
nRBC: 0 % (ref 0.0–0.2)

## 2020-11-05 LAB — PROTIME-INR
INR: 1.1 (ref 0.8–1.2)
Prothrombin Time: 14 seconds (ref 11.4–15.2)

## 2020-11-05 LAB — COMPREHENSIVE METABOLIC PANEL
ALT: 13 U/L (ref 0–44)
AST: 24 U/L (ref 15–41)
Albumin: 3.7 g/dL (ref 3.5–5.0)
Alkaline Phosphatase: 97 U/L (ref 38–126)
Anion gap: 16 — ABNORMAL HIGH (ref 5–15)
BUN: 16 mg/dL (ref 8–23)
CO2: 23 mmol/L (ref 22–32)
Calcium: 9 mg/dL (ref 8.9–10.3)
Chloride: 98 mmol/L (ref 98–111)
Creatinine, Ser: 0.66 mg/dL (ref 0.44–1.00)
GFR, Estimated: >60 mL/min (ref >60)
Glucose, Bld: 132 mg/dL — ABNORMAL HIGH (ref 70–99)
Potassium: 2.7 mmol/L — CL (ref 3.5–5.1)
Sodium: 137 mmol/L (ref 135–145)
Total Bilirubin: 1.5 mg/dL — ABNORMAL HIGH (ref 0.3–1.2)
Total Protein: 7.2 g/dL (ref 6.5–8.1)

## 2020-11-05 LAB — LACTIC ACID, PLASMA
Lactic Acid, Venous: 2.2 mmol/L (ref 0.5–1.9)
Lactic Acid, Venous: 2.5 mmol/L (ref 0.5–1.9)
Lactic Acid, Venous: 2.1 mmol/L (ref 0.5–1.9)

## 2020-11-05 LAB — TROPONIN I (HIGH SENSITIVITY): Troponin I (High Sensitivity): 11 ng/L (ref <18)

## 2020-11-05 LAB — APTT: aPTT: 29 seconds (ref 24–36)

## 2020-11-05 LAB — PROCALCITONIN: Procalcitonin: <0.1 ng/mL

## 2020-11-05 MED ORDER — SODIUM CHLORIDE 0.9 % IV SOLN
100.0000 mg | Freq: Once | INTRAVENOUS | Status: AC
  Administered 2020-11-05: 100 mg via INTRAVENOUS
  Filled 2020-11-05: qty 100

## 2020-11-05 MED ORDER — SODIUM CHLORIDE 0.9 % IV BOLUS (SEPSIS)
1000.0000 mL | Freq: Once | INTRAVENOUS | Status: AC
  Administered 2020-11-05: 1000 mL via INTRAVENOUS

## 2020-11-05 MED ORDER — IOHEXOL 350 MG/ML SOLN
75.0000 mL | Freq: Once | INTRAVENOUS | Status: AC | PRN
  Administered 2020-11-05: 75 mL via INTRAVENOUS

## 2020-11-05 MED ORDER — SODIUM CHLORIDE 0.9 % IV SOLN
2.0000 g | INTRAVENOUS | Status: DC
  Administered 2020-11-05: 2 g via INTRAVENOUS
  Filled 2020-11-05: qty 20

## 2020-11-05 MED ORDER — SODIUM CHLORIDE 0.9 % IV BOLUS (SEPSIS)
250.0000 mL | Freq: Once | INTRAVENOUS | Status: AC
  Administered 2020-11-05: 250 mL via INTRAVENOUS

## 2020-11-05 MED ORDER — SODIUM CHLORIDE 0.9 % IV BOLUS (SEPSIS)
500.0000 mL | Freq: Once | INTRAVENOUS | Status: AC
  Administered 2020-11-05: 500 mL via INTRAVENOUS

## 2020-11-05 NOTE — ED Notes (Signed)
Pt transitioned to RA

## 2020-11-05 NOTE — ED Notes (Signed)
This RN came into the pts room at [facility] was attempting to change the patient IV pigtail catheter. This RN assumed care of changing pigtail. Ct tech endorsing "I came to check if I can use this IV, we don't use EMS IV pigtails." This RN changed pigtail and secured dressing with clean technique.

## 2020-11-05 NOTE — Discharge Instructions (Signed)
Your labs and CT scan today were all reassuring.  Please follow-up closely with your doctor, and return to the emergency department right away if you have any worsening symptoms or new concerns.

## 2020-11-05 NOTE — ED Triage Notes (Signed)
Pt presents to ED with c/o of having a sudden onset of SOB. Pt state she was sitting in her recliner and states she found it hard to catch her breath. Pt denies any resp issues at this time other than recent DX lung cancer but states no treatment started yet.   Pt is currently on 3L/min via [place] maintaining a O2 sat of 97-100%. Pt can speak in full sentences at this time without issue. Pt presents tachycardiac, pt denies fevers or chills.   Pt was given duoneb by EMS PTA and also 125 mg of IV solumedrol. Pt is A&Ox4.

## 2020-11-05 NOTE — ED Notes (Signed)
Dc ppw provided. Followup information given. Pt questions answered. Pt verbalized consent for DC. Pt assisted to [place] via wheelchair with family

## 2020-11-05 NOTE — Progress Notes (Signed)
Pt being followed by [facility] for Sepsis protocol. 

## 2020-11-05 NOTE — ED Notes (Signed)
Pt ambulatory trial on RA, pt walked 200+ feet with no SHOB or increased wob. Pt o2 sat over 91%

## 2020-11-05 NOTE — Consult Note (Signed)
CODE SEPSIS - PHARMACY COMMUNICATION  **Broad Spectrum Antibiotics should be administered within 1 hour of Sepsis diagnosis**  Time Code Sepsis Called/Page Received: 1843  Antibiotics Ordered: 1843  Time of 1st antibiotic administration: 1857  Additional action taken by pharmacy: N/A  If necessary, Name of Provider/Nurse Contacted: N/A    Darnelle Bos ,PharmD Clinical Pharmacist  11/05/2020  6:51 PM

## 2020-11-05 NOTE — ED Provider Notes (Signed)
[facility] Emergency Department Provider Note  ____________________________________________  Time seen: Approximately 7:30 PM  I have reviewed the triage vital signs and the nursing notes.   HISTORY  Chief Complaint Shortness of Breath    HPI [name] is a 80 y.o. female with a history of CHF hypertension and recently diagnosed lung cancer who comes ED complaining of abrupt onset of shortness of breath while sitting in her recliner at about 6:00 PM tonight.  No chest pain or other pain complaints.  Shortness of breath is severe and EMS report that on their arrival patient was in distress, with minimal air movement on auscultation.  They put the patient on nonrebreather and gave DuoNeb and IV Solu-Medrol and note that she has improved substantially during transport..  Patient does endorse nasal congestion over the last 2 days and productive cough.    Past Medical History:  Diagnosis Date   Arthritis    Breast cancer [facility]) 2011   radiation- Left   CHF (congestive heart failure) (HCC)    Coronary artery disease    Hyperlipidemia    Hypertension    Myocardial infarction [facility])    Personal history of radiation therapy    PVD (peripheral vascular disease) ([place])      Patient Active Problem List   Diagnosis Date Noted   Unstable angina ([place])    COVID-19 virus infection    Acute on chronic HFrEF (heart failure with reduced ejection fraction) ([place]) 07/24/2020   Coronary artery disease 07/24/2020   Acute respiratory failure with hypoxia ([place]) 07/24/2020   Acute exacerbation of CHF (congestive heart failure) ([place]) 07/24/2020   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 05/30/2020   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   NSTEMI (non-ST elevated myocardial infarction) ([place]) 04/30/2020   Elevated troponin 04/30/2020   History of left breast cancer 11/25/2019   Hyperlipidemia 11/25/2019   Carotid stenosis 08/24/2017   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   Pain  01/22/2016   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/2016   PAD (peripheral artery disease) ([place]) 11/22/2014   H/O malignant neoplasm of breast 11/22/2014   HLD (hyperlipidemia) 11/22/2014   Essential hypertension 11/22/2014   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     Past Surgical History:  Procedure Laterality Date   ABDOMINAL HYSTERECTOMY     APPENDECTOMY     BREAST BIOPSY Left 12/21/2009   positive, radiation dcis   BREAST EXCISIONAL BIOPSY Left 01/17/2010   lumpectomy DCIS   BREAST LUMPECTOMY     CATARACT EXTRACTION W/ INTRAOCULAR LENS IMPLANT & ANTERIOR VITRECTOMY, BILATERAL     CHOLECYSTECTOMY     COLONOSCOPY WITH PROPOFOL N/A 09/25/2017   Procedure: COLONOSCOPY WITH PROPOFOL;  Surgeon: Lollie Sails, MD;  [facility] ENDOSCOPY;  Service: Endoscopy;  Laterality: N/A;   CORONARY ARTERY BYPASS GRAFT N/A 05/03/2020   Procedure: CORONARY ARTERY BYPASS GRAFTING (CABG) X FOUR ON PUMP USING LEFT INTERNAL MAMMARY ARTERY AND RIGHT ENDOSCOPIC SAPHEOUS VEIN HARVEST CONDUITS;  Surgeon: Melrose Nakayama, MD;  Location: [place];  Service: Open Heart Surgery;  Laterality: N/A;   DILATION AND CURETTAGE OF UTERUS     JOINT REPLACEMENT Right 01/24/2016   KNEE ARTHROPLASTY Right 01/24/2016   Procedure: COMPUTER ASSISTED TOTAL KNEE ARTHROPLASTY;  Surgeon: Dereck Leep, MD;  Location: [facility] ORS;  Service: Orthopedics;  Laterality: Right;   LEFT HEART CATH AND CORONARY ANGIOGRAPHY N/A 05/01/2020   Procedure: LEFT HEART CATH AND CORONARY ANGIOGRAPHY;  Surgeon: Wellington Hampshire,  MD;  Location: [place] CV LAB;  Service: Cardiovascular;  Laterality: N/A;   RIGHT/LEFT HEART CATH AND CORONARY/GRAFT ANGIOGRAPHY N/A 07/27/2020   Procedure: RIGHT/LEFT HEART CATH AND CORONARY/GRAFT ANGIOGRAPHY;  Surgeon: Wellington Hampshire, MD;  Location: [place] CV LAB;  Service: Cardiovascular;  Laterality: N/A;   stent in Lt leg Left    TEE WITHOUT CARDIOVERSION N/A 05/03/2020    Procedure: TRANSESOPHAGEAL ECHOCARDIOGRAM (TEE);  Surgeon: Melrose Nakayama, MD;  Location: [place];  Service: Open Heart Surgery;  Laterality: N/A;   VIDEO BRONCHOSCOPY WITH ENDOBRONCHIAL NAVIGATION N/A 09/25/2020   Procedure: VIDEO BRONCHOSCOPY WITH ENDOBRONCHIAL NAVIGATION;  Surgeon: Melrose Nakayama, MD;  Location: [place];  Service: Thoracic;  Laterality: N/A;     Prior to Admission medications   Medication Sig Start Date End Date Taking? Authorizing Provider  acetaminophen (TYLENOL) 500 MG tablet Take 1,000 mg by mouth every 6 (six) hours as needed for moderate pain or headache.   Yes [provider]  aspirin EC 81 MG tablet Take 81 mg by mouth daily.   Yes [provider]  atorvastatin (LIPITOR) 20 MG tablet Take 1 tablet (20 mg total) by mouth at bedtime. 06/22/20  Yes Visser, Jacquelyn D, PA-C  Calcium Carbonate-Vit D-Min (CALCIUM 600+D3 PLUS MINERALS) 600-800 MG-UNIT TABS Take 1 tablet by mouth daily.   Yes [provider]  clopidogrel (PLAVIX) 75 MG tablet Take 1 tablet (75 mg total) by mouth daily. 05/22/20 05/17/21 Yes Visser, Jacquelyn D, PA-C  furosemide (LASIX) 40 MG tablet Take 1 tablet (40 mg total) by mouth daily. 07/29/20 11/05/20 Yes Dahal, Marlowe Aschoff, MD  metoprolol succinate (TOPROL-XL) 50 MG 24 hr tablet Take 1 tablet (50 mg total) by mouth daily. Take with or immediately following a meal. Patient taking differently: Take 50 mg by mouth 2 (two) times daily. Take with or immediately following a meal. 07/28/20 11/05/20 Yes Dahal, Marlowe Aschoff, MD  Multiple Vitamin (MULTIVITAMIN WITH MINERALS) TABS tablet Take 1 tablet by mouth daily.   Yes [provider]  potassium chloride SA (KLOR-CON) 10 MEQ tablet Take 1 tablet (10 mEq total) by mouth daily. 07/28/20 11/05/20 Yes Dahal, Marlowe Aschoff, MD  sacubitril-valsartan (ENTRESTO) 24-26 MG Take 1 tablet by mouth 2 (two) times daily. 07/28/20 11/05/20 Yes Dahal, Marlowe Aschoff, MD  diphenhydrAMINE-zinc acetate (BENADRYL) cream  Apply 1 application topically daily as needed for itching.    [provider]  nitroGLYCERIN (NITROSTAT) 0.4 MG SL tablet Place 1 tablet (0.4 mg total) under the tongue every 5 (five) minutes as needed for up to 10 days for chest pain. Patient not taking: No sig reported 07/28/20   Terrilee Croak, MD     Allergies Ibuprofen, Penicillins, Aleve [naproxen sodium], Avapro  [irbesartan], Azithromycin, and Nickel   Family History  Problem Relation Age of Onset   Hypertension Mother    Arthritis Mother    Gout Mother    Lung cancer Father    Heart attack Sister     Social History Social History   Tobacco Use   Smoking status: Former   Smokeless tobacco: Never  Scientific laboratory technician Use: Never used  Substance Use Topics   Alcohol use: No    Alcohol/week: 0.0 standard drinks   Drug use: No    Review of Systems  Constitutional:   No fever or chills.  ENT:   No sore throat.  Positive rhinorrhea. Cardiovascular:   No chest pain or syncope. Respiratory:   Positive shortness of breath and cough. Gastrointestinal:  Negative for abdominal pain, vomiting and diarrhea.  Musculoskeletal:   Negative for focal pain or swelling All other systems reviewed and are negative except as documented above in ROS and HPI.  ____________________________________________   PHYSICAL EXAM:  VITAL SIGNS: ED Triage Vitals  Enc Vitals Group     BP 11/05/20 1834 116/62     Pulse Rate 11/05/20 1834 (!) 127     Resp 11/05/20 1834 13     Temp 11/05/20 1834 99.5 F (37.5 C)     Temp Source 11/05/20 1834 Axillary     SpO2 11/05/20 1834 97 %     Weight 11/05/20 1841 118 lb 9.6 oz (53.8 kg)     Height 11/05/20 1841 5' (1.524 m)     Head Circumference --      Peak Flow --      Pain Score 11/05/20 1840 0     Pain Loc --      Pain Edu? --      Excl. in [place]? --     Vital signs reviewed, nursing assessments reviewed.   Constitutional:   Alert and oriented. Non-toxic appearance. Eyes:    Conjunctivae are normal. EOMI. PERRL. ENT      Head:   Normocephalic and atraumatic.      Nose:   Normal.      Mouth/Throat:   Dry mucous membranes.      Neck:   No meningismus. Full ROM. Hematological/Lymphatic/Immunilogical:   No cervical lymphadenopathy. Cardiovascular:   Tachycardia heart rate 125. Symmetric bilateral radial and DP pulses.  No murmurs. Cap refill less than 2 seconds. Respiratory:   Accessory muscle use.  Symmetric air movement.  Mild diffuse expiratory wheezing.  No focal crackles. Gastrointestinal:   Soft and nontender. Non distended. There is no CVA tenderness.  No rebound, rigidity, or guarding. Genitourinary:   deferred Musculoskeletal:   Normal range of motion in all extremities. No joint effusions.  No lower extremity tenderness.  No edema. Neurologic:   Normal speech and language.  Motor grossly intact. No acute focal neurologic deficits are appreciated.  Skin:    Skin is warm, dry and intact. No rash noted.  No petechiae, purpura, or bullae.  ____________________________________________    LABS (pertinent positives/negatives) (all labs ordered are listed, but only abnormal results are displayed) Labs Reviewed  LACTIC ACID, PLASMA - Abnormal; Notable for the following components:      Result Value   Lactic Acid, Venous 2.2 (*)    All other components within normal limits  LACTIC ACID, PLASMA - Abnormal; Notable for the following components:   Lactic Acid, Venous 2.5 (*)    All other components within normal limits  COMPREHENSIVE METABOLIC PANEL - Abnormal; Notable for the following components:   Potassium 2.7 (*)    Glucose, Bld 132 (*)    Total Bilirubin 1.5 (*)    Anion gap 16 (*)    All other components within normal limits  CBC WITH DIFFERENTIAL/PLATELET - Abnormal; Notable for the following components:   RBC 3.43 (*)    Hemoglobin 11.5 (*)    HCT 33.4 (*)    Platelets 139 (*)    All other components within normal limits  LACTIC ACID, PLASMA  - Abnormal; Notable for the following components:   Lactic Acid, Venous 2.1 (*)    All other components within normal limits  RESP PANEL BY RT-PCR (FLU A&B, COVID) ARPGX2  CULTURE, BLOOD (ROUTINE X 2)  CULTURE, BLOOD (ROUTINE X 2)  URINE CULTURE  PROTIME-INR  APTT  PROCALCITONIN  TROPONIN I (HIGH SENSITIVITY)   ____________________________________________   EKG Interpreted by me Sinus tachycardia rate 130.  Left ulnar branch block.  No acute ischemic changes.   ____________________________________________    RADIOLOGY  CT Angio Chest PE W and/or Wo Contrast  Result Date: 11/05/2020 CLINICAL DATA:  PE suspected, high prob Shortness of breath. EXAM: CT ANGIOGRAPHY CHEST WITH CONTRAST TECHNIQUE: Multidetector CT imaging of the chest was performed using the standard protocol during bolus administration of intravenous contrast. Multiplanar CT image reconstructions and MIPs were obtained to evaluate the vascular anatomy. CONTRAST:  43mL OMNIPAQUE IOHEXOL 350 MG/ML SOLN COMPARISON:  Radiograph earlier today. PET CT 09/14/2020. Chest CTA 07/24/2020 FINDINGS: Cardiovascular: There are no filling defects within the pulmonary arteries to suggest pulmonary embolus. Post median sternotomy and CABG. Calcification of native coronary arteries. Aortic atherosclerosis and tortuosity with calcified and irregular noncalcified atheromatous plaque. No dissection or acute aortic syndrome. Left vertebral artery arises directly from the thoracic aorta, variant anatomy. No pericardial effusion. Mediastinum/Nodes: Left hilar lymph nodes are subcentimeter short axis, including an 8 mm node series 4, image 35. Scattered mediastinal nodes are subcentimeter short axis. Small left posterior periaortic node adjacent to the descending aorta measures 8 mm, this is increased in size from prior PET. No enlarged lymph nodes by size criteria. There are calcified subcarinal lymph nodes consistent with prior granulomatous  disease. No esophageal wall thickening. No visualized thyroid nodule. Lungs/Pleura: Left lower lobe pulmonary mass measures 5.7 x 4.7 x 6.5 cm, increased in size from prior PET where it measured 5.2 x 3.8 cm. Central low density consistent with necrosis again seen, the previous air component has resolved. Slight adjacent ground-glass. There is a trace left pleural effusion and pleural thickening. No new pulmonary nodules. Bandlike atelectasis in the dependent right lower lobe. Upper Abdomen: Cholecystectomy. No acute upper abdominal findings. No adrenal nodule. Musculoskeletal: Fluid density structure in the left breast spanning 2.5 cm is not significantly changed, not hypermetabolic on prior PET. Exaggerated thoracic kyphosis with multilevel spondylosis. No focal bone lesion or acute osseous abnormalities. Review of the MIP images confirms the above findings. IMPRESSION: 1. No pulmonary embolus. 2. Increased size of left lower lobe pulmonary mass from prior PET now measuring 5.7 x 4.7 x 6.5 cm. Central low density consistent with necrosis again seen, the previous air component has resolved. 3. Trace left pleural effusion and pleural thickening. 4. Left hilar lymph nodes are subcentimeter short axis, nonspecific. Small left posterior periaortic node adjacent to the descending aorta measures 8 mm, increased in size from prior PET. 5. Additional stable chronic findings as described. Electronically Signed   By: Keith Rake M.D.   On: 11/05/2020 22:10   DG Chest Port 1 View  Result Date: 11/05/2020 CLINICAL DATA:  Concern for sepsis. EXAM: PORTABLE CHEST 1 VIEW COMPARISON:  Chest radiograph dated 09/21/2020 and CT dated 09/05/2020. FINDINGS: No significant interval change in the left lower lobe mass with central necrosis since the prior radiograph. No new consolidation. There is no pleural effusion or pneumothorax. Stable cardiomediastinal silhouette. Median sternotomy wires and CABG vascular clips.  Atherosclerotic calcification of the aorta. No acute osseous pathology. IMPRESSION: No significant interval change in the left lower lobe mass with central necrosis. No new consolidation. Electronically Signed   By: Anner Crete M.D.   On: 11/05/2020 19:13    ____________________________________________   PROCEDURES .Critical Care Performed by: Carrie Mew, MD Authorized by: Carrie Mew, MD   Critical care provider  statement:    Critical care time (minutes):  35   Critical care time was exclusive of:  Separately billable procedures and treating other patients   Critical care was necessary to treat or prevent imminent or life-threatening deterioration of the following conditions:  Sepsis and respiratory failure   Critical care was time spent personally by me on the following activities:  Development of treatment plan with patient or surrogate, discussions with consultants, evaluation of patient's response to treatment, examination of patient, obtaining history from patient or surrogate, ordering and performing treatments and interventions, ordering and review of laboratory studies, ordering and review of radiographic studies, pulse oximetry, re-evaluation of patient's condition and review of old charts  ____________________________________________  DIFFERENTIAL DIAGNOSIS   Pneumonia, pleural effusion, pulmonary edema, bronchospasm, pulmonary embolism  CLINICAL IMPRESSION / ASSESSMENT AND PLAN / ED COURSE  Medications ordered in the ED: Medications  sodium chloride 0.9 % bolus 1,000 mL (0 mLs Intravenous Stopped 11/05/20 2200)    And  sodium chloride 0.9 % bolus 500 mL (0 mLs Intravenous Stopped 11/05/20 2146)    And  sodium chloride 0.9 % bolus 250 mL (0 mLs Intravenous Stopped 11/05/20 2146)  doxycycline (VIBRAMYCIN) 100 mg in sodium chloride 0.9 % 250 mL IVPB (0 mg Intravenous Stopped 11/05/20 2235)  iohexol (OMNIPAQUE) 350 MG/ML injection 75 mL (75 mLs Intravenous  Contrast Given 11/05/20 2149)    Pertinent labs & imaging results that were available during my care of the patient were reviewed by me and considered in my medical decision making (see chart for details).  JAZMAINE FUELLING was evaluated in Emergency Department on 11/06/2020 for the symptoms described in the history of present illness. She was evaluated in the context of the global COVID-19 pandemic, which necessitated consideration that the patient might be at risk for infection with the SARS-CoV-2 virus that causes COVID-19. Institutional protocols and algorithms that pertain to the evaluation of patients at risk for COVID-19 are in a state of rapid change based on information released by regulatory bodies including the CDC and federal and state organizations. These policies and algorithms were followed during the patient's care in the ED.   Patient presents with fever tachycardia and respiratory distress concerning for pneumonia and sepsis.  Patient started on IV fluids, empiric antibiotics with Rocephin and doxycycline due to azithromycin allergy.  Chest x-ray does not show any acute findings such as effusion edema or lobar consolidation.  With her lung cancer which has not yet been treated, she needs CT angiogram of the chest to evaluate for PE.  Clinical Course as of 11/06/20 1527  Sun Nov 05, 2020  2245 Serial lactates are mildly elevated, slightly improved.  I think that is chronically elevated due to necrotic tumor in the chest and not due to sepsis related microcirculatory dysfunction.  Tachycardia is improved.  Patient states that she feels completely better, not requiring any oxygen, no shortness of breath.  [PS]  2252 Patient does note a feeling of malaise prior to onset of shortness of breath today.  Will check troponin to evaluate for MI, although it is unlikely given complete lack of pain.. [PS]    Clinical Course User Index [PS] Carrie Mew, MD     ----------------------------------------- 3:29 PM on 11/06/2020 ----------------------------------------- Late entry note. Patient reevaluated and 11:30 PM last night, reports that she is still feeling well.  Maintain good oxygen saturation during ambulation trial.  Tolerating oral intake.  Troponin normal.  Offered hospitalization for further observation but patient declines,  strongly prefers to be discharged home and follow-up with her doctors.  Strict return precautions discussed including returning for any worsening of symptoms.   ____________________________________________   FINAL CLINICAL IMPRESSION(S) / ED DIAGNOSES    Final diagnoses:  Malaise  Shortness of breath  Malignant neoplasm of left lung, unspecified part of lung [facility])     ED Discharge Orders     None       Portions of this note were generated with dragon dictation software. Dictation errors may occur despite best attempts at proofreading.    Carrie Mew, MD 11/06/20 1530

## 2020-11-05 NOTE — ED Notes (Signed)
Pt given [place] sandwich and diet cola.

## 2020-11-05 NOTE — ED Notes (Signed)
Critical lactate 2.2 potassium 2.7 MD aware

## 2020-11-05 NOTE — ED Notes (Signed)
Pt to CT with CT Tech.

## 2020-11-08 NOTE — Pre-Procedure Instructions (Signed)
Surgical Instructions    Your procedure is scheduled on Monday, October 31st.  Report to [facility] Main Entrance "A" at 8:45 A.M., then check in with the Admitting office.  Call this number if you have problems the morning of surgery:  [phone]   If you have any questions prior to your surgery date call [phone]: Open Monday-Friday 8am-4pm    Remember:  Do not eat or drink after midnight the night before your surgery    Take these medicines the morning of surgery with A SIP OF WATER  metoprolol succinate (TOPROL-XL)  acetaminophen (TYLENOL)-as needed   Follow your surgeon's instructions on when to stop Aspirin and clopidogrel (PLAVIX).  If no instructions were given by your surgeon then you will need to call the office to get those instructions.    As of today, STOP taking any Aleve, Naproxen, Ibuprofen, Motrin, Advil, Goody's, BC's, all herbal medications, fish oil, and all vitamins.                     Do NOT Smoke (Tobacco/Vaping) or drink Alcohol 24 hours prior to your procedure.  If you use a CPAP at night, you may bring all equipment for your overnight stay.   Contacts, glasses, piercing's, hearing aid's, dentures or partials may not be worn into surgery, please bring cases for these belongings.    For patients admitted to the hospital, discharge time will be determined by your treatment team.   Patients discharged the day of surgery will not be allowed to drive home, and someone needs to stay with them for 24 hours.  NO VISITORS WILL BE ALLOWED IN PRE-OP WHERE PATIENTS GET READY FOR SURGERY.  ONLY 1 SUPPORT PERSON MAY BE PRESENT IN THE WAITING ROOM WHILE YOU ARE IN SURGERY.  IF YOU ARE TO BE ADMITTED, ONCE YOU ARE IN YOUR ROOM YOU WILL BE ALLOWED TWO (2) VISITORS.  Minor children may have two parents present. Special consideration for safety and communication needs will be reviewed on a case by case basis.   Special instructions:   [place]- Preparing For  Surgery  Before surgery, you can play an important role. Because skin is not sterile, your skin needs to be as free of germs as possible. You can reduce the number of germs on your skin by washing with CHG (chlorahexidine gluconate) Soap before surgery.  CHG is an antiseptic cleaner which kills germs and bonds with the skin to continue killing germs even after washing.    Oral Hygiene is also important to reduce your risk of infection.  Remember - BRUSH YOUR TEETH THE MORNING OF SURGERY WITH YOUR REGULAR TOOTHPASTE  Please do not use if you have an allergy to CHG or antibacterial soaps. If your skin becomes reddened/irritated stop using the CHG.  Do not shave (including legs and underarms) for at least 48 hours prior to first CHG shower. It is OK to shave your face.  Please follow these instructions carefully.   Shower the NIGHT BEFORE SURGERY and the MORNING OF SURGERY  If you chose to wash your hair, wash your hair first as usual with your normal shampoo.  After you shampoo, rinse your hair and body thoroughly to remove the shampoo.  Use CHG Soap as you would any other liquid soap. You can apply CHG directly to the skin and wash gently with a scrungie or a clean washcloth.   Apply the CHG Soap to your body ONLY FROM THE NECK DOWN.  Do  not use on open wounds or open sores. Avoid contact with your eyes, ears, mouth and genitals (private parts). Wash Face and genitals (private parts)  with your normal soap.   Wash thoroughly, paying special attention to the area where your surgery will be performed.  Thoroughly rinse your body with warm water from the neck down.  DO NOT shower/wash with your normal soap after using and rinsing off the CHG Soap.  Pat yourself dry with a CLEAN TOWEL.  Wear CLEAN PAJAMAS to bed the night before surgery  Place CLEAN SHEETS on your bed the night before your surgery  DO NOT SLEEP WITH PETS.   Day of Surgery: Shower with CHG soap. Do not wear jewelry,  make up, nail polish, gel polish, artificial nails, or any other type of covering on natural nails including finger and toenails. If patients have artificial nails, gel coating, etc. that need to be removed by a nail salon please have this removed prior to surgery. Surgery may need to be canceled/delayed if the surgeon/ anesthesia feels like the patient is unable to be adequately monitored. Do not wear lotions, powders, perfumes, or deodorant. Do not shave 48 hours prior to surgery.   Do not bring valuables to the hospital. [facility] is not responsible for any belongings or valuables. Wear Clean/Comfortable clothing the morning of surgery Remember to brush your teeth WITH YOUR REGULAR TOOTHPASTE.   Please read over the following fact sheets that you were given.   3 days prior to your procedure or After your COVID test   You are not required to quarantine however you are required to wear a well-fitting mask when you are out and around people not in your household. If your mask becomes wet or soiled, replace with a new one.   Wash your hands often with soap and water for 20 seconds or clean your hands with an alcohol-based hand sanitizer that contains at least 60% alcohol.   Do not share personal items.   Notify your provider:  o if you are in close contact with someone who has COVID  o or if you develop a fever of 100.4 or greater, sneezing, cough, sore throat, shortness of breath or body aches.

## 2020-11-09 ENCOUNTER — Encounter (HOSPITAL_COMMUNITY)
Admission: RE | Admit: 2020-11-09 | Discharge: 2020-11-09 | Disposition: A | Discharge status: Home / Self Care | Payer: Medicare Other | Source: Ambulatory Visit | Attending: Thoracic Surgery (Cardiothoracic Vascular Surgery) | Admitting: Thoracic Surgery (Cardiothoracic Vascular Surgery)

## 2020-11-09 ENCOUNTER — Encounter (HOSPITAL_COMMUNITY): Payer: Self-pay

## 2020-11-09 ENCOUNTER — Other Ambulatory Visit: Payer: Self-pay

## 2020-11-09 VITALS — BP 137/74 | HR 99 | Temp 97.6°F | Resp 18 | Ht 60.0 in | Wt 122.9 lb

## 2020-11-09 DIAGNOSIS — I214 Non-ST elevation (NSTEMI) myocardial infarction: Secondary | ICD-10-CM

## 2020-11-09 DIAGNOSIS — R52 Pain, unspecified: Secondary | ICD-10-CM

## 2020-11-09 DIAGNOSIS — I10 Essential (primary) hypertension: Secondary | ICD-10-CM

## 2020-11-09 DIAGNOSIS — J9601 Acute respiratory failure with hypoxia: Secondary | ICD-10-CM

## 2020-11-09 DIAGNOSIS — I200 Unstable angina: Secondary | ICD-10-CM

## 2020-11-09 DIAGNOSIS — Z01818 Encounter for other preprocedural examination: Secondary | ICD-10-CM | POA: Diagnosis present

## 2020-11-09 DIAGNOSIS — Z20822 Contact with and (suspected) exposure to covid-19: Secondary | ICD-10-CM | POA: Diagnosis not present

## 2020-11-09 DIAGNOSIS — C3432 Malignant neoplasm of lower lobe, left bronchus or lung: Secondary | ICD-10-CM | POA: Diagnosis not present

## 2020-11-09 DIAGNOSIS — J984 Other disorders of lung: Secondary | ICD-10-CM

## 2020-11-09 DIAGNOSIS — U071 COVID-19: Secondary | ICD-10-CM

## 2020-11-09 DIAGNOSIS — R778 Other specified abnormalities of plasma proteins: Secondary | ICD-10-CM

## 2020-11-09 DIAGNOSIS — I5023 Acute on chronic systolic (congestive) heart failure: Secondary | ICD-10-CM | POA: Diagnosis not present

## 2020-11-09 DIAGNOSIS — Z951 Presence of aortocoronary bypass graft: Secondary | ICD-10-CM

## 2020-11-09 DIAGNOSIS — Z853 Personal history of malignant neoplasm of breast: Secondary | ICD-10-CM

## 2020-11-09 DIAGNOSIS — I739 Peripheral vascular disease, unspecified: Secondary | ICD-10-CM | POA: Diagnosis not present

## 2020-11-09 HISTORY — DX: Dyspnea, unspecified: R06.00

## 2020-11-09 LAB — BLOOD GAS, ARTERIAL
Acid-Base Excess: 0.8 mmol/L (ref 0.0–2.0)
Bicarbonate: 25.2 mmol/L (ref 20.0–28.0)
Drawn by: 602861
FIO2: 21
O2 Saturation: 97.1 %
Patient temperature: 37
pCO2 arterial: 42.9 mmHg (ref 32.0–48.0)
pH, Arterial: 7.388 (ref 7.350–7.450)
pO2, Arterial: 91.8 mmHg (ref 83.0–108.0)

## 2020-11-09 LAB — CBC
HCT: 36.1 % (ref 36.0–46.0)
Hemoglobin: 12.1 g/dL (ref 12.0–15.0)
MCH: 33.2 pg (ref 26.0–34.0)
MCHC: 33.5 g/dL (ref 30.0–36.0)
MCV: 98.9 fL (ref 80.0–100.0)
Platelets: 163 10*3/uL (ref 150–400)
RBC: 3.65 MIL/uL — ABNORMAL LOW (ref 3.87–5.11)
RDW: 13.7 % (ref 11.5–15.5)
WBC: 8.1 10*3/uL (ref 4.0–10.5)
nRBC: 0 % (ref 0.0–0.2)

## 2020-11-09 LAB — URINALYSIS, ROUTINE W REFLEX MICROSCOPIC
Bilirubin Urine: NEGATIVE
Glucose, UA: NEGATIVE mg/dL
Hgb urine dipstick: NEGATIVE
Ketones, ur: NEGATIVE mg/dL
Leukocytes,Ua: NEGATIVE
Nitrite: NEGATIVE
Protein, ur: NEGATIVE mg/dL
Specific Gravity, Urine: 1.008 (ref 1.005–1.030)
pH: 6 (ref 5.0–8.0)

## 2020-11-09 LAB — SURGICAL PCR SCREEN
MRSA, PCR: NEGATIVE
Staphylococcus aureus: NEGATIVE

## 2020-11-09 LAB — PROTIME-INR
INR: 1.1 (ref 0.8–1.2)
Prothrombin Time: 13.8 seconds (ref 11.4–15.2)

## 2020-11-09 LAB — COMPREHENSIVE METABOLIC PANEL
ALT: 14 U/L (ref 0–44)
AST: 24 U/L (ref 15–41)
Albumin: 3.2 g/dL — ABNORMAL LOW (ref 3.5–5.0)
Alkaline Phosphatase: 88 U/L (ref 38–126)
Anion gap: 9 (ref 5–15)
BUN: 13 mg/dL (ref 8–23)
CO2: 23 mmol/L (ref 22–32)
Calcium: 8.8 mg/dL — ABNORMAL LOW (ref 8.9–10.3)
Chloride: 104 mmol/L (ref 98–111)
Creatinine, Ser: 0.79 mg/dL (ref 0.44–1.00)
GFR, Estimated: >60 mL/min (ref >60)
Glucose, Bld: 104 mg/dL — ABNORMAL HIGH (ref 70–99)
Potassium: 3.1 mmol/L — ABNORMAL LOW (ref 3.5–5.1)
Sodium: 136 mmol/L (ref 135–145)
Total Bilirubin: 1.1 mg/dL (ref 0.3–1.2)
Total Protein: 6.7 g/dL (ref 6.5–8.1)

## 2020-11-09 LAB — ACID FAST CULTURE WITH REFLEXED SENSITIVITIES (MYCOBACTERIA): Acid Fast Culture: NEGATIVE

## 2020-11-09 LAB — SARS CORONAVIRUS 2 (TAT 6-24 HRS): SARS Coronavirus 2: NEGATIVE

## 2020-11-09 LAB — APTT: aPTT: 27 seconds (ref 24–36)

## 2020-11-09 NOTE — Progress Notes (Signed)
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  Hospitalized 7/11-15/22 for acute on chronic HFrEF. At that time she had repeat cath by Dr. Fletcher Anon confirming patent grafts, drop in EF out of proportion to her coronary disease. Echo showed EF 20-25% (down from 40% on prior echo). Dramatic improvement of her symptoms with better blood pressure control, diuresis with IV Lasix. 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 Last seen outpatient by Marrianne Mood, PA-C 10/06/20. At that time pt deferred addition of spironolactone and SGLT2 inhibitor. It was discussed that she was considering surgery for lung CA.  Lung mass incidentally discovered on admission for CABG. 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 Dr. Roxan Hockey advised surgical resection.   Hx of left-sided breast cancer s/p lumpectomy and radiation.  Pt was seen at the ED on 10/23 for mildly productive cough and acute onset of shortness of breath.  She improved significantly with DuoNeb and steroids.  Work-up was largely benign, troponin normal, serial lactates only mildly elevated. She had a CTA of the chest redemonstrating the mass (noted to be slightly larger) but no acute process.  She was discharged on doxycycline and prednisone which she is taking.  I spoke with her today at her appointment and she was doing fairly well, still having a mildly productive cough in the morning along with some baseline DOE, but she had no shortness of breath or cough while I was speaking with her.  Exam was benign, lungs CTAB, no lower extremity edema, vitals WNL, O2 sat 98% on arrival. Overall she appears stable to proceed with surgery as planned. She was advised to seek care if symptoms worsened. Dr.  Roxan Hockey was also made aware.  Preop labs reviewed, mild hypokalemia with potassium 3.1, otherwise unremarkable.  EKG 11/09/20: Normal sinus rhythm. Rate 74. Left bundle branch block  Spirometry 10/30/20: Spirometry Data Is Acceptable and Reproducible   Mild Obstructive Airways Disease without  Significant Broncho-Dilator Response   R/L Cardiac cath 07/27/20:  Colon Flattery LAD to Prox LAD lesion is 80% stenosed.   Ost Cx to Prox Cx lesion is 80% stenosed.   Ost RCA to Prox RCA lesion is 60% stenosed.   Mid RCA lesion is 100% stenosed.   Ost LM to Dist LM lesion is 90% stenosed.   SVG graft was visualized by angiography and is normal in caliber.   SVG graft was visualized by angiography and is normal in caliber.   LIMA graft was visualized by non-selective angiography and is normal in caliber.   The graft exhibits no disease.   The graft exhibits no disease.   The graft exhibits no disease. 1.  Severe underlying left main and three-vessel coronary artery disease with patent grafts including LIMA to LAD, SVG to OM1 and SVG to right PDA/OM. 2.  Left ventricular angiography was not performed.  EF was severely reduced by echo. 3.  Significant peripheral arterial disease with heavy calcified vessels.  There is evidence of moderate to severe disease affecting the right common femoral artery. 4.  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  Her grafts are patent.  Continue medical therapy for coronary artery disease. She is still  volume overloaded and thus recommending continuing IV diuresis.  Optimize heart failure medications   Echo 07/25/20:  1. Left ventricular ejection fraction, by estimation, is 20 to 25%. The left ventricle has severely decreased function. The left ventricle demonstrates global hypokinesis. The left  ventricular internal cavity size was mildly to moderately dilated. Left ventricular diastolic parameters were normal.   2. Right ventricular systolic function is normal. The right ventricular size is mildly enlarged.   3. Left atrial size was mildly dilated.   4. Right atrial size was mildly dilated.   5. Moderate pleural effusion in the left lateral region.   6. The mitral valve is grossly normal. Moderate mitral valve  regurgitation.   7. The aortic valve is grossly normal. Aortic valve regurgitation is trivial.      [name] [facility]/Anesthesiology Phone [phone] 11/09/2020 2:56 PM

## 2020-11-09 NOTE — Anesthesia Preprocedure Evaluation (Addendum)
Anesthesia Evaluation  Patient identified by MRN, date of birth, ID band Patient awake    Reviewed: Allergy & Precautions, H&P , NPO status , Patient's Chart, lab work & pertinent test results, reviewed documented beta blocker date and time   Airway Mallampati: II  TM Distance: >3 FB Neck ROM: Full    Dental no notable dental hx. (+) Edentulous Upper, Edentulous Lower, Dental Advisory Given   Pulmonary neg pulmonary ROS, former smoker,    Pulmonary exam normal breath sounds clear to auscultation       Cardiovascular hypertension, Pt. on medications and Pt. on home beta blockers + CAD, + Past MI, + Peripheral Vascular Disease and +CHF   Rhythm:Regular Rate:Normal     Neuro/Psych negative neurological ROS  negative psych ROS   GI/Hepatic negative GI ROS, Neg liver ROS,   Endo/Other  negative endocrine ROS  Renal/GU negative Renal ROS  negative genitourinary   Musculoskeletal  (+) Arthritis , Osteoarthritis,    Abdominal   Peds  Hematology negative hematology ROS (+)   Anesthesia Other Findings   Reproductive/Obstetrics negative OB ROS                           Anesthesia Physical Anesthesia Plan  ASA: 4  Anesthesia Plan: General   Post-op Pain Management:    Induction: Intravenous  PONV Risk Score and Plan: 4 or greater and Ondansetron, Dexamethasone and Midazolam  Airway Management Planned: Double Lumen EBT  Additional Equipment: Arterial line and CVP  Intra-op Plan:   Post-operative Plan: Extubation in OR and Possible Post-op intubation/ventilation  Informed Consent: I have reviewed the patients History and Physical, chart, labs and discussed the procedure including the risks, benefits and alternatives for the proposed anesthesia with the patient or authorized representative who has indicated his/her understanding and acceptance.     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  Hospitalized 7/11-15/63for acute on chronic HFrEF. At that time she had repeat cath by Dr. Fletcher Anon confirming patent grafts, drop in EF out of proportion to her coronary disease. Echo showed EF 20-25% (down from 40% on prior echo). Dramatic improvement of her symptoms with better blood pressure control, diuresis with IV Lasix. 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 Last seen outpatient by Marrianne Mood, PA-C 10/06/20. At that time pt deferred addition of spironolactone and SGLT2 inhibitor. It was discussed that she was considering surgery for lung CA.  Lung mass incidentally discovered on admission for CABG. 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 Dr. Roxan Hockey advised surgical resection.   Hx of left-sided breast cancer s/p lumpectomy and radiation.  Pt was seen at the ED on 10/23 for mildly productive cough and acute onset of shortness of breath.  She improved significantly with DuoNeb and steroids.  Work-up was largely benign, troponin normal, serial lactates only mildly elevated. She had a CTA of the chest redemonstrating the mass (noted to be slightly larger) but no acute process.  She was discharged on doxycycline and prednisone which she is taking.  I spoke with her today at her appointment and she was doing fairly well, still having a mildly productive cough in the morning  along with some baseline DOE, but she had no shortness of breath or cough while I was speaking with her.  Exam was benign, lungs CTAB, no lower extremity edema, vitals WNL, O2 sat 98% on arrival. Overall she appears stable to proceed with surgery as planned. She was advised  to seek care if symptoms worsened. Dr. Roxan Hockey was also made aware.  Preop labs reviewed, mild hypokalemia with potassium 3.1, otherwise unremarkable.  EKG 11/09/20: Normal sinus rhythm. Rate 74. Left bundle branch block  Spirometry 10/30/20: Spirometry Data Is Acceptable and Reproducible  Mild Obstructive Airways Disease without Significant Broncho-Dilator Response  R/L Cardiac cath 07/27/20: Colon Flattery LAD to Prox LAD lesion is 80% stenosed. Colon Flattery Cx to Prox Cx lesion is 80% stenosed. Colon Flattery RCA to Prox RCA lesion is 60% stenosed. . Mid RCA lesion is 100% stenosed. Colon Flattery LM to Dist LM lesion is 90% stenosed. . SVG graft was visualized by angiography and is normal in caliber. . SVG graft was visualized by angiography and is normal in caliber. Marland Kitchen LIMA graft was visualized by non-selective angiography and is normal in caliber. . The graft exhibits no disease. . The graft exhibits no disease. . The graft exhibits no disease. 1. Severe underlying left main and three-vessel coronary artery disease with patent grafts including LIMA to LAD, SVG to OM1 and SVG to right PDA/OM. 2. Left ventricular angiography was not performed. EF was severely reduced by echo. 3. Significant peripheral arterial disease with heavy calcified vessels. There is evidence of moderate to severe disease affecting the right common femoral artery. 4. 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 Her grafts are patent. Continue medical therapy for coronary artery disease. She is still volume overloaded and thus recommending continuing IV diuresis. Optimize heart failure medications  Echo 07/25/20: 1. Left ventricular ejection fraction, by estimation, is 20 to 25%.The left ventricle has severely decreased function. The left  ventricle demonstrates global hypokinesis. The left ventricular internal cavity size was mildly to moderately dilated. Left ventricular diastolic parameters were normal.  2. Right ventricular systolic function is normal. The right ventricular size is mildly enlarged.  3. Left atrial size was mildly dilated.  4. Right atrial size was mildly dilated.  5. Moderate pleural effusion in the left lateral region.  6. The mitral valve is grossly normal. Moderate mitral valve  regurgitation.  7. The aortic valve is grossly normal. Aortic valve regurgitation is trivial.  )      Anesthesia Quick Evaluation

## 2020-11-09 NOTE — Progress Notes (Signed)
PCP - Derinda Late, MD Cardiologist - Marrianne Mood, PA-C  PPM/ICD - denies Device Orders - n/a Rep Notified - n/a  Chest x-ray - 11/09/2020 EKG - 11/09/2020 Stress Test - denies ECHO - 07/25/2020 Cardiac Cath - 07/27/2020  Sleep Study - denies CPAP - n/a  Fasting Blood Sugar - n/a  Blood Thinner Instructions: Plavix - last dose on 10/25 per patient  Aspirin Instructions: Patient will continue to take Aspirin until the day of surgery.    ERAS Protcol - n/a  COVID TEST- yes, done in PAT on 11/09/2020   Anesthesia review: yes; cardiac history. Patient was saw in PAT by Karoline Caldwell PA-C due to her recent visit to ED.   Patient denies shortness of breath, fever, cough and chest pain at PAT appointment   All instructions explained to the patient, with a verbal understanding of the material. Patient agrees to go over the instructions while at home for a better understanding. Patient also instructed to self quarantine after being tested for COVID-19. The opportunity to ask questions was provided.

## 2020-11-10 LAB — CULTURE, BLOOD (ROUTINE X 2)
Culture: NO GROWTH
Culture: NO GROWTH
Special Requests: ADEQUATE
Special Requests: ADEQUATE

## 2020-11-13 ENCOUNTER — Other Ambulatory Visit: Payer: Self-pay

## 2020-11-13 ENCOUNTER — Inpatient Hospital Stay (HOSPITAL_COMMUNITY): Payer: Medicare Other | Admitting: Physician Assistant

## 2020-11-13 ENCOUNTER — Encounter (HOSPITAL_COMMUNITY): Payer: Self-pay | Admitting: Thoracic Surgery (Cardiothoracic Vascular Surgery)

## 2020-11-13 ENCOUNTER — Inpatient Hospital Stay (HOSPITAL_COMMUNITY): Payer: Medicare Other

## 2020-11-13 ENCOUNTER — Inpatient Hospital Stay (HOSPITAL_COMMUNITY)
Admission: RE | Admit: 2020-11-13 | Discharge: 2020-11-22 | DRG: 164 | Disposition: A | Discharge status: Home / Self Care | Payer: Medicare Other | Source: Ambulatory Visit | Attending: Thoracic Surgery (Cardiothoracic Vascular Surgery) | Admitting: Thoracic Surgery (Cardiothoracic Vascular Surgery)

## 2020-11-13 ENCOUNTER — Inpatient Hospital Stay (HOSPITAL_COMMUNITY): Payer: Medicare Other | Admitting: Anesthesiology

## 2020-11-13 ENCOUNTER — Encounter (HOSPITAL_COMMUNITY)
Admission: RE | Disposition: A | Discharge status: Home / Self Care | Payer: Self-pay | Source: Ambulatory Visit | Attending: Thoracic Surgery (Cardiothoracic Vascular Surgery)

## 2020-11-13 DIAGNOSIS — I252 Old myocardial infarction: Secondary | ICD-10-CM | POA: Diagnosis not present

## 2020-11-13 DIAGNOSIS — Z853 Personal history of malignant neoplasm of breast: Secondary | ICD-10-CM

## 2020-11-13 DIAGNOSIS — D62 Acute posthemorrhagic anemia: Secondary | ICD-10-CM | POA: Diagnosis not present

## 2020-11-13 DIAGNOSIS — Z09 Encounter for follow-up examination after completed treatment for conditions other than malignant neoplasm: Secondary | ICD-10-CM

## 2020-11-13 DIAGNOSIS — J939 Pneumothorax, unspecified: Secondary | ICD-10-CM

## 2020-11-13 DIAGNOSIS — Z902 Acquired absence of lung [part of]: Secondary | ICD-10-CM

## 2020-11-13 DIAGNOSIS — I255 Ischemic cardiomyopathy: Secondary | ICD-10-CM | POA: Diagnosis present

## 2020-11-13 DIAGNOSIS — Z91048 Other nonmedicinal substance allergy status: Secondary | ICD-10-CM

## 2020-11-13 DIAGNOSIS — I5022 Chronic systolic (congestive) heart failure: Secondary | ICD-10-CM | POA: Diagnosis present

## 2020-11-13 DIAGNOSIS — I110 Hypertensive heart disease with heart failure: Secondary | ICD-10-CM | POA: Diagnosis present

## 2020-11-13 DIAGNOSIS — Z881 Allergy status to other antibiotic agents status: Secondary | ICD-10-CM

## 2020-11-13 DIAGNOSIS — C771 Secondary and unspecified malignant neoplasm of intrathoracic lymph nodes: Secondary | ICD-10-CM | POA: Diagnosis present

## 2020-11-13 DIAGNOSIS — I2510 Atherosclerotic heart disease of native coronary artery without angina pectoris: Secondary | ICD-10-CM | POA: Diagnosis present

## 2020-11-13 DIAGNOSIS — Z951 Presence of aortocoronary bypass graft: Secondary | ICD-10-CM | POA: Diagnosis not present

## 2020-11-13 DIAGNOSIS — Z923 Personal history of irradiation: Secondary | ICD-10-CM | POA: Diagnosis not present

## 2020-11-13 DIAGNOSIS — Z886 Allergy status to analgesic agent status: Secondary | ICD-10-CM

## 2020-11-13 DIAGNOSIS — I739 Peripheral vascular disease, unspecified: Secondary | ICD-10-CM

## 2020-11-13 DIAGNOSIS — E785 Hyperlipidemia, unspecified: Secondary | ICD-10-CM | POA: Diagnosis present

## 2020-11-13 DIAGNOSIS — Z87891 Personal history of nicotine dependence: Secondary | ICD-10-CM | POA: Diagnosis not present

## 2020-11-13 DIAGNOSIS — Z880 Allergy status to penicillin: Secondary | ICD-10-CM

## 2020-11-13 DIAGNOSIS — J95812 Postprocedural air leak: Secondary | ICD-10-CM | POA: Diagnosis not present

## 2020-11-13 DIAGNOSIS — Z4682 Encounter for fitting and adjustment of non-vascular catheter: Secondary | ICD-10-CM

## 2020-11-13 DIAGNOSIS — J9811 Atelectasis: Secondary | ICD-10-CM | POA: Diagnosis not present

## 2020-11-13 DIAGNOSIS — Z79899 Other long term (current) drug therapy: Secondary | ICD-10-CM

## 2020-11-13 DIAGNOSIS — C3432 Malignant neoplasm of lower lobe, left bronchus or lung: Secondary | ICD-10-CM | POA: Diagnosis present

## 2020-11-13 DIAGNOSIS — J95811 Postprocedural pneumothorax: Secondary | ICD-10-CM | POA: Diagnosis not present

## 2020-11-13 DIAGNOSIS — Z7902 Long term (current) use of antithrombotics/antiplatelets: Secondary | ICD-10-CM | POA: Diagnosis not present

## 2020-11-13 DIAGNOSIS — D696 Thrombocytopenia, unspecified: Secondary | ICD-10-CM | POA: Diagnosis not present

## 2020-11-13 DIAGNOSIS — I454 Nonspecific intraventricular block: Secondary | ICD-10-CM | POA: Diagnosis present

## 2020-11-13 DIAGNOSIS — I5023 Acute on chronic systolic (congestive) heart failure: Secondary | ICD-10-CM

## 2020-11-13 DIAGNOSIS — Z7982 Long term (current) use of aspirin: Secondary | ICD-10-CM

## 2020-11-13 HISTORY — PX: LYMPH NODE DISSECTION: SHX5087

## 2020-11-13 HISTORY — PX: INTERCOSTAL NERVE BLOCK: SHX5021

## 2020-11-13 LAB — PREPARE RBC (CROSSMATCH)

## 2020-11-13 SURGERY — LOBECTOMY, LUNG, ROBOT-ASSISTED, USING VATS
Anesthesia: General | Site: Chest | Laterality: Left

## 2020-11-13 MED ORDER — BISACODYL 5 MG PO TBEC
10.0000 mg | DELAYED_RELEASE_TABLET | Freq: Every day | ORAL | Status: DC
  Administered 2020-11-14: 10 mg via ORAL
  Filled 2020-11-13 (×5): qty 2

## 2020-11-13 MED ORDER — HYDROMORPHONE HCL 1 MG/ML IJ SOLN
0.2500 mg | INTRAMUSCULAR | Status: DC | PRN
  Administered 2020-11-13: 0.5 mg via INTRAVENOUS

## 2020-11-13 MED ORDER — ROCURONIUM BROMIDE 10 MG/ML (PF) SYRINGE
PREFILLED_SYRINGE | INTRAVENOUS | Status: AC
  Filled 2020-11-13: qty 10

## 2020-11-13 MED ORDER — ACETAMINOPHEN 160 MG/5ML PO SOLN
1000.0000 mg | Freq: Four times a day (QID) | ORAL | Status: AC
  Administered 2020-11-15: 1000 mg via ORAL
  Filled 2020-11-13: qty 40.6

## 2020-11-13 MED ORDER — VANCOMYCIN HCL IN DEXTROSE 1-5 GM/200ML-% IV SOLN: 1000.0000 mg | INTRAVENOUS | Status: AC

## 2020-11-13 MED ORDER — MIDAZOLAM HCL 5 MG/5ML IJ SOLN
INTRAMUSCULAR | Status: DC | PRN
  Administered 2020-11-13: 2 mg via INTRAVENOUS
  Administered 2020-11-13: 1 mg via INTRAVENOUS

## 2020-11-13 MED ORDER — BUPIVACAINE HCL (PF) 0.5 % IJ SOLN
INTRAMUSCULAR | Status: AC
  Filled 2020-11-13: qty 30

## 2020-11-13 MED ORDER — TRAMADOL HCL 50 MG PO TABS
50.0000 mg | ORAL_TABLET | Freq: Four times a day (QID) | ORAL | Status: DC | PRN
  Administered 2020-11-19: 50 mg via ORAL
  Filled 2020-11-13: qty 1

## 2020-11-13 MED ORDER — EPHEDRINE SULFATE-NACL 50-0.9 MG/10ML-% IV SOSY
PREFILLED_SYRINGE | INTRAVENOUS | Status: DC | PRN
  Administered 2020-11-13: 2.5 mg via INTRAVENOUS

## 2020-11-13 MED ORDER — CHLORHEXIDINE GLUCONATE CLOTH 2 % EX PADS
6.0000 | MEDICATED_PAD | Freq: Every day | CUTANEOUS | Status: DC
  Administered 2020-11-14 – 2020-11-19 (×5): 6 via TOPICAL

## 2020-11-13 MED ORDER — FENTANYL CITRATE (PF) 250 MCG/5ML IJ SOLN
INTRAMUSCULAR | Status: AC
  Filled 2020-11-13: qty 5

## 2020-11-13 MED ORDER — FENTANYL CITRATE (PF) 100 MCG/2ML IJ SOLN
INTRAMUSCULAR | Status: DC | PRN
  Administered 2020-11-13: 100 ug via INTRAVENOUS
  Administered 2020-11-13 (×2): 50 ug via INTRAVENOUS
  Administered 2020-11-13: 25 ug via INTRAVENOUS
  Administered 2020-11-13: 50 ug via INTRAVENOUS
  Administered 2020-11-13: 25 ug via INTRAVENOUS

## 2020-11-13 MED ORDER — VANCOMYCIN HCL IN DEXTROSE 1-5 GM/200ML-% IV SOLN
INTRAVENOUS | Status: AC
  Administered 2020-11-13: 1000 mg via INTRAVENOUS
  Filled 2020-11-13: qty 200

## 2020-11-13 MED ORDER — SODIUM CHLORIDE 0.9% IV SOLUTION
INTRAVENOUS | Status: AC | PRN
  Administered 2020-11-13: 1000 mL via INTRAMUSCULAR

## 2020-11-13 MED ORDER — CHLORHEXIDINE GLUCONATE 0.12 % MT SOLN: 15.0000 mL | Freq: Once | OROMUCOSAL | Status: AC

## 2020-11-13 MED ORDER — MORPHINE SULFATE (PF) 2 MG/ML IV SOLN
2.0000 mg | INTRAVENOUS | Status: DC | PRN
  Administered 2020-11-15 – 2020-11-17 (×2): 2 mg via INTRAVENOUS
  Filled 2020-11-13 (×2): qty 1

## 2020-11-13 MED ORDER — MIDAZOLAM HCL 2 MG/2ML IJ SOLN
INTRAMUSCULAR | Status: AC
  Administered 2020-11-13: 1 mg via INTRAVENOUS
  Filled 2020-11-13: qty 2

## 2020-11-13 MED ORDER — PROPOFOL 10 MG/ML IV BOLUS
INTRAVENOUS | Status: AC
  Filled 2020-11-13: qty 20

## 2020-11-13 MED ORDER — BUPIVACAINE LIPOSOME 1.3 % IJ SUSP
INTRAMUSCULAR | Status: AC
  Filled 2020-11-13: qty 20

## 2020-11-13 MED ORDER — NOREPINEPHRINE 4 MG/250ML-% IV SOLN
INTRAVENOUS | Status: DC | PRN
Start: 2020-11-13 — End: 2020-11-13
  Administered 2020-11-13: 1 ug/min via INTRAVENOUS

## 2020-11-13 MED ORDER — ACETAMINOPHEN 500 MG PO TABS
1000.0000 mg | ORAL_TABLET | Freq: Four times a day (QID) | ORAL | Status: AC
  Administered 2020-11-14 – 2020-11-18 (×17): 1000 mg via ORAL
  Filled 2020-11-13 (×17): qty 2

## 2020-11-13 MED ORDER — SENNOSIDES-DOCUSATE SODIUM 8.6-50 MG PO TABS
1.0000 | ORAL_TABLET | Freq: Every day | ORAL | Status: DC
  Administered 2020-11-17: 1 via ORAL
  Filled 2020-11-13 (×6): qty 1

## 2020-11-13 MED ORDER — ONDANSETRON HCL 4 MG/2ML IJ SOLN
4.0000 mg | Freq: Four times a day (QID) | INTRAMUSCULAR | Status: DC | PRN
Start: 2020-11-13 — End: 2020-11-22

## 2020-11-13 MED ORDER — OXYCODONE HCL 5 MG PO TABS
5.0000 mg | ORAL_TABLET | ORAL | Status: DC | PRN
  Administered 2020-11-14 – 2020-11-19 (×6): 5 mg via ORAL
  Filled 2020-11-13 (×6): qty 1

## 2020-11-13 MED ORDER — SODIUM CHLORIDE 0.9 % IV SOLN: INTRAVENOUS | Status: DC

## 2020-11-13 MED ORDER — DEXAMETHASONE SODIUM PHOSPHATE 10 MG/ML IJ SOLN
INTRAMUSCULAR | Status: DC | PRN
  Administered 2020-11-13: 10 mg via INTRAVENOUS

## 2020-11-13 MED ORDER — LUNG SURGERY BOOK
Freq: Once | Status: AC
  Filled 2020-11-13: qty 1

## 2020-11-13 MED ORDER — ENOXAPARIN SODIUM 40 MG/0.4ML IJ SOSY
40.0000 mg | PREFILLED_SYRINGE | Freq: Every day | INTRAMUSCULAR | Status: DC
  Administered 2020-11-14 – 2020-11-21 (×8): 40 mg via SUBCUTANEOUS
  Filled 2020-11-13 (×8): qty 0.4

## 2020-11-13 MED ORDER — ONDANSETRON HCL 4 MG/2ML IJ SOLN
INTRAMUSCULAR | Status: AC
  Filled 2020-11-13: qty 2

## 2020-11-13 MED ORDER — PHENYLEPHRINE 40 MCG/ML (10ML) SYRINGE FOR IV PUSH (FOR BLOOD PRESSURE SUPPORT)
PREFILLED_SYRINGE | INTRAVENOUS | Status: AC
  Filled 2020-11-13: qty 10

## 2020-11-13 MED ORDER — SUGAMMADEX SODIUM 200 MG/2ML IV SOLN
INTRAVENOUS | Status: DC | PRN
  Administered 2020-11-13: 200 mg via INTRAVENOUS

## 2020-11-13 MED ORDER — LACTATED RINGERS IV SOLN: INTRAVENOUS | Status: DC

## 2020-11-13 MED ORDER — CLOPIDOGREL BISULFATE 75 MG PO TABS: 75.0000 mg | ORAL_TABLET | Freq: Every day | ORAL | Status: DC

## 2020-11-13 MED ORDER — ONDANSETRON HCL 4 MG/2ML IJ SOLN
INTRAMUSCULAR | Status: DC | PRN
  Administered 2020-11-13: 4 mg via INTRAVENOUS

## 2020-11-13 MED ORDER — POTASSIUM CHLORIDE CRYS ER 10 MEQ PO TBCR
10.0000 meq | EXTENDED_RELEASE_TABLET | Freq: Every day | ORAL | Status: DC
  Administered 2020-11-14 – 2020-11-22 (×9): 10 meq via ORAL
  Filled 2020-11-13 (×10): qty 1

## 2020-11-13 MED ORDER — ATORVASTATIN CALCIUM 10 MG PO TABS
20.0000 mg | ORAL_TABLET | Freq: Every day | ORAL | Status: DC
  Administered 2020-11-14 – 2020-11-22 (×9): 20 mg via ORAL
  Filled 2020-11-13 (×9): qty 2

## 2020-11-13 MED ORDER — HEMOSTATIC AGENTS (NO CHARGE) OPTIME
TOPICAL | Status: DC | PRN
  Administered 2020-11-13: 1 via TOPICAL

## 2020-11-13 MED ORDER — PHENYLEPHRINE HCL (PRESSORS) 10 MG/ML IV SOLN: INTRAVENOUS | Status: DC | PRN

## 2020-11-13 MED ORDER — 0.9 % SODIUM CHLORIDE (POUR BTL) OPTIME
TOPICAL | Status: DC | PRN
  Administered 2020-11-13 (×2): 1000 mL

## 2020-11-13 MED ORDER — HYDROMORPHONE HCL 1 MG/ML IJ SOLN
INTRAMUSCULAR | Status: AC
  Filled 2020-11-13: qty 1

## 2020-11-13 MED ORDER — PHENYLEPHRINE 40 MCG/ML (10ML) SYRINGE FOR IV PUSH (FOR BLOOD PRESSURE SUPPORT)
PREFILLED_SYRINGE | INTRAVENOUS | Status: DC | PRN
  Administered 2020-11-13: 80 ug via INTRAVENOUS

## 2020-11-13 MED ORDER — SODIUM CHLORIDE FLUSH 0.9 % IV SOLN
INTRAVENOUS | Status: DC | PRN
  Administered 2020-11-13: 100 mL

## 2020-11-13 MED ORDER — ACETAMINOPHEN 500 MG PO TABS
1000.0000 mg | ORAL_TABLET | Freq: Once | ORAL | Status: AC
  Administered 2020-11-13: 1000 mg via ORAL
  Filled 2020-11-13: qty 2

## 2020-11-13 MED ORDER — MIDAZOLAM HCL 2 MG/2ML IJ SOLN
INTRAMUSCULAR | Status: AC
  Filled 2020-11-13: qty 2

## 2020-11-13 MED ORDER — CHLORHEXIDINE GLUCONATE 0.12 % MT SOLN
OROMUCOSAL | Status: AC
  Administered 2020-11-13: 15 mL via OROMUCOSAL
  Filled 2020-11-13: qty 15

## 2020-11-13 MED ORDER — DEXAMETHASONE SODIUM PHOSPHATE 10 MG/ML IJ SOLN
INTRAMUSCULAR | Status: AC
  Filled 2020-11-13: qty 1

## 2020-11-13 MED ORDER — SACUBITRIL-VALSARTAN 24-26 MG PO TABS
1.0000 | ORAL_TABLET | Freq: Two times a day (BID) | ORAL | Status: DC
  Administered 2020-11-14 – 2020-11-21 (×16): 1 via ORAL
  Filled 2020-11-13 (×17): qty 1

## 2020-11-13 MED ORDER — FENTANYL CITRATE (PF) 100 MCG/2ML IJ SOLN: 50.0000 ug | Freq: Once | INTRAMUSCULAR | Status: AC

## 2020-11-13 MED ORDER — PROPOFOL 10 MG/ML IV BOLUS
INTRAVENOUS | Status: DC | PRN
  Administered 2020-11-13: 70 mg via INTRAVENOUS

## 2020-11-13 MED ORDER — ASPIRIN EC 81 MG PO TBEC
81.0000 mg | DELAYED_RELEASE_TABLET | Freq: Every day | ORAL | Status: DC
  Administered 2020-11-14 – 2020-11-22 (×9): 81 mg via ORAL
  Filled 2020-11-13 (×9): qty 1

## 2020-11-13 MED ORDER — FUROSEMIDE 40 MG PO TABS
40.0000 mg | ORAL_TABLET | Freq: Every day | ORAL | Status: DC
  Administered 2020-11-14 – 2020-11-21 (×8): 40 mg via ORAL
  Filled 2020-11-13 (×9): qty 1

## 2020-11-13 MED ORDER — VANCOMYCIN HCL IN DEXTROSE 1-5 GM/200ML-% IV SOLN
1000.0000 mg | Freq: Two times a day (BID) | INTRAVENOUS | Status: AC
Start: 2020-11-13 — End: 2020-11-14
  Administered 2020-11-13: 1000 mg via INTRAVENOUS
  Filled 2020-11-13 (×2): qty 200

## 2020-11-13 MED ORDER — FENTANYL CITRATE (PF) 100 MCG/2ML IJ SOLN
INTRAMUSCULAR | Status: AC
  Administered 2020-11-13: 50 ug via INTRAVENOUS
  Filled 2020-11-13: qty 2

## 2020-11-13 MED ORDER — ORAL CARE MOUTH RINSE: 15.0000 mL | Freq: Once | OROMUCOSAL | Status: AC

## 2020-11-13 MED ORDER — MIDAZOLAM HCL 2 MG/2ML IJ SOLN: 1.0000 mg | Freq: Once | INTRAMUSCULAR | Status: AC

## 2020-11-13 MED ORDER — ROCURONIUM BROMIDE 10 MG/ML (PF) SYRINGE
PREFILLED_SYRINGE | INTRAVENOUS | Status: DC | PRN
  Administered 2020-11-13: 40 mg via INTRAVENOUS
  Administered 2020-11-13 (×2): 30 mg via INTRAVENOUS

## 2020-11-13 MED ORDER — SODIUM CHLORIDE 0.9% IV SOLUTION: Freq: Once | INTRAVENOUS | Status: DC

## 2020-11-13 SURGICAL SUPPLY — 145 items
ADH SKN CLS APL DERMABOND .7 (GAUZE/BANDAGES/DRESSINGS) ×2
APPLIER CLIP ROT 10 11.4 M/L (STAPLE) ×4
APR CLP MED LRG 11.4X10 (STAPLE) ×2
BAG SPEC RTRVL C1550 15 (MISCELLANEOUS) ×2
BIT DRILL 7/64X5 DISP (BIT) IMPLANT
BLADE CLIPPER SURG (BLADE) ×4 IMPLANT
CANISTER SUCT 3000ML PPV (MISCELLANEOUS) ×8 IMPLANT
CANNULA REDUC XI 12-8 STAPL (CANNULA) ×6
CANNULA REDUC XI 12-8MM STAPL (CANNULA) ×2
CANNULA REDUCER 12-8 DVNC XI (CANNULA) ×4 IMPLANT
CATH THORACIC 28FR (CATHETERS) IMPLANT
CATH THORACIC 28FR RT ANG (CATHETERS) IMPLANT
CATH THORACIC 36FR (CATHETERS) IMPLANT
CATH THORACIC 36FR RT ANG (CATHETERS) IMPLANT
CLIP APPLIE ROT 10 11.4 M/L (STAPLE) IMPLANT
CLIP VESOCCLUDE MED 6/CT (CLIP) ×4 IMPLANT
CNTNR URN SCR LID CUP LEK RST (MISCELLANEOUS) ×10 IMPLANT
CONN ST 1/4X3/8  BEN (MISCELLANEOUS)
CONN ST 1/4X3/8 BEN (MISCELLANEOUS) IMPLANT
CONN Y 3/8X3/8X3/8  BEN (MISCELLANEOUS)
CONN Y 3/8X3/8X3/8 BEN (MISCELLANEOUS) IMPLANT
CONT SPEC 4OZ STRL OR WHT (MISCELLANEOUS) ×40
DEFOGGER SCOPE WARMER CLEARIFY (MISCELLANEOUS) ×10 IMPLANT
DERMABOND ADVANCED (GAUZE/BANDAGES/DRESSINGS) ×2
DERMABOND ADVANCED .7 DNX12 (GAUZE/BANDAGES/DRESSINGS) ×2 IMPLANT
DRAIN CHANNEL 28F RND 3/8 FF (WOUND CARE) IMPLANT
DRAIN CHANNEL 32F RND 10.7 FF (WOUND CARE) IMPLANT
DRAPE ARM DVNC X/XI (DISPOSABLE) ×8 IMPLANT
DRAPE COLUMN DVNC XI (DISPOSABLE) ×2 IMPLANT
DRAPE CV SPLIT W-CLR ANES SCRN (DRAPES) ×4 IMPLANT
DRAPE DA VINCI XI ARM (DISPOSABLE) ×16
DRAPE DA VINCI XI COLUMN (DISPOSABLE) ×4
DRAPE HALF SHEET 40X57 (DRAPES) ×4 IMPLANT
DRAPE INCISE IOBAN 66X45 STRL (DRAPES) ×2 IMPLANT
DRAPE ORTHO SPLIT 77X108 STRL (DRAPES) ×4
DRAPE SURG ORHT 6 SPLT 77X108 (DRAPES) ×2 IMPLANT
ELECT BLADE 6.5 EXT (BLADE) ×4 IMPLANT
ELECT REM PT RETURN 9FT ADLT (ELECTROSURGICAL) ×4
ELECTRODE REM PT RTRN 9FT ADLT (ELECTROSURGICAL) ×2 IMPLANT
FELT TEFLON 1X6 (MISCELLANEOUS) ×4 IMPLANT
GAUZE KITTNER 4X5 RF (MISCELLANEOUS) ×14 IMPLANT
GAUZE SPONGE 4X4 12PLY STRL (GAUZE/BANDAGES/DRESSINGS) ×4 IMPLANT
GLOVE SURG MICRO LTX SZ7.5 (GLOVE) IMPLANT
GLOVE SURG POLYISO LF SZ7.5 (GLOVE) ×2 IMPLANT
GLOVE SURG POLYISO LF SZ8 (GLOVE) ×10 IMPLANT
GLOVE SURG SIGNA 7.5 PF LTX (GLOVE) ×12 IMPLANT
GLOVE SURG SYN 7.5  E (GLOVE)
GLOVE SURG SYN 7.5 E (GLOVE) IMPLANT
GLOVE SURG SYN 7.5 PF PI (GLOVE) IMPLANT
GOWN STRL REUS W/ TWL LRG LVL3 (GOWN DISPOSABLE) ×4 IMPLANT
GOWN STRL REUS W/ TWL XL LVL3 (GOWN DISPOSABLE) ×4 IMPLANT
GOWN STRL REUS W/TWL 2XL LVL3 (GOWN DISPOSABLE) ×4 IMPLANT
GOWN STRL REUS W/TWL LRG LVL3 (GOWN DISPOSABLE) ×8
GOWN STRL REUS W/TWL XL LVL3 (GOWN DISPOSABLE) ×8
HANDLE STAPLE ENDO GIA SHORT (STAPLE) ×2
HEMOSTAT SURGICEL 2X14 (HEMOSTASIS) ×12 IMPLANT
INSERT FOGARTY 61MM (MISCELLANEOUS) IMPLANT
IRRIGATION STRYKERFLOW (MISCELLANEOUS) ×2 IMPLANT
IRRIGATOR STRYKERFLOW (MISCELLANEOUS) ×4
KIT BASIN OR (CUSTOM PROCEDURE TRAY) ×4 IMPLANT
KIT SUCTION CATH 14FR (SUCTIONS) ×4 IMPLANT
KIT TURNOVER KIT B (KITS) ×4 IMPLANT
LOOP VESSEL SUPERMAXI WHITE (MISCELLANEOUS) IMPLANT
NDL HYPO 25GX1X1/2 BEV (NEEDLE) ×2 IMPLANT
NDL SPNL 22GX3.5 QUINCKE BK (NEEDLE) ×2 IMPLANT
NEEDLE HYPO 25GX1X1/2 BEV (NEEDLE) ×4 IMPLANT
NEEDLE SPNL 22GX3.5 QUINCKE BK (NEEDLE) ×4 IMPLANT
NS IRRIG 1000ML POUR BTL (IV SOLUTION) ×12 IMPLANT
PACK CHEST (CUSTOM PROCEDURE TRAY) ×4 IMPLANT
PAD ARMBOARD 7.5X6 YLW CONV (MISCELLANEOUS) ×8 IMPLANT
PASSER SUT SWANSON 36MM LOOP (INSTRUMENTS) IMPLANT
PORT ACCESS TROCAR AIRSEAL 12 (TROCAR) ×2 IMPLANT
PORT ACCESS TROCAR AIRSEAL 5M (TROCAR) ×4
PROGEL SPRAY TIP 11IN (MISCELLANEOUS) ×4
RELOAD STAPLE 45 2.5 WHT DVNC (STAPLE) IMPLANT
RELOAD STAPLE 45 3.5 BLU DVNC (STAPLE) IMPLANT
RELOAD STAPLE 45 4.3 GRN DVNC (STAPLE) IMPLANT
RELOAD STAPLER 2.5X45 WHT DVNC (STAPLE) ×10 IMPLANT
RELOAD STAPLER 3.5X45 BLU DVNC (STAPLE) ×12 IMPLANT
RELOAD STAPLER 4.3X45 GRN DVNC (STAPLE) ×2 IMPLANT
RELOAD TRI 45 ART MED THCK BLK (STAPLE) ×2 IMPLANT
SCISSORS LAP 5X35 DISP (ENDOMECHANICALS) IMPLANT
SEAL CANN UNIV 5-8 DVNC XI (MISCELLANEOUS) ×4 IMPLANT
SEAL XI 5MM-8MM UNIVERSAL (MISCELLANEOUS) ×8
SEALANT PROGEL (MISCELLANEOUS) ×2 IMPLANT
SEALANT SURG COSEAL 4ML (VASCULAR PRODUCTS) IMPLANT
SEALANT SURG COSEAL 8ML (VASCULAR PRODUCTS) IMPLANT
SET TRI-LUMEN FLTR TB AIRSEAL (TUBING) ×4 IMPLANT
SHEARS HARMONIC HDI 20CM (ELECTROSURGICAL) IMPLANT
SOLUTION ELECTROLUBE (MISCELLANEOUS) ×4 IMPLANT
SPONGE INTESTINAL PEANUT (DISPOSABLE) ×2 IMPLANT
SPONGE T-LAP 18X18 ~~LOC~~+RFID (SPONGE) ×2 IMPLANT
SPONGE TONSIL TAPE 1 RFD (DISPOSABLE) ×4 IMPLANT
STAPLER 45 SUREFORM CVD (STAPLE) ×4
STAPLER 45 SUREFORM CVD DVNC (STAPLE) IMPLANT
STAPLER CANNULA SEAL DVNC XI (STAPLE) ×4 IMPLANT
STAPLER CANNULA SEAL XI (STAPLE) ×8
STAPLER ENDO GIA 12 SHRT THIN (STAPLE) IMPLANT
STAPLER ENDO GIA 12MM SHORT (STAPLE) ×2 IMPLANT
STAPLER RELOAD 2.5X45 WHITE (STAPLE) ×20
STAPLER RELOAD 2.5X45 WHT DVNC (STAPLE) ×10
STAPLER RELOAD 3.5X45 BLU DVNC (STAPLE) ×12
STAPLER RELOAD 3.5X45 BLUE (STAPLE) ×24
STAPLER RELOAD 4.3X45 GREEN (STAPLE) ×4
STAPLER RELOAD 4.3X45 GRN DVNC (STAPLE) ×2
STAPLER VISISTAT 35W (STAPLE) IMPLANT
STOPCOCK 4 WAY LG BORE MALE ST (IV SETS) ×4 IMPLANT
SUT PDS AB 3-0 SH 27 (SUTURE) IMPLANT
SUT PROLENE 2 0 MH 48 (SUTURE) IMPLANT
SUT PROLENE 2 0 SH DA (SUTURE) IMPLANT
SUT PROLENE 3 0 SH 48 (SUTURE) IMPLANT
SUT PROLENE 4 0 RB 1 (SUTURE) ×4
SUT PROLENE 4 0 SH DA (SUTURE) IMPLANT
SUT PROLENE 4-0 RB1 .5 CRCL 36 (SUTURE) ×2 IMPLANT
SUT SILK  1 MH (SUTURE) ×8
SUT SILK 1 MH (SUTURE) ×4 IMPLANT
SUT SILK 1 TIES 10X30 (SUTURE) ×4 IMPLANT
SUT SILK 2 0 SH (SUTURE) ×4 IMPLANT
SUT SILK 2 0SH CR/8 30 (SUTURE) ×4 IMPLANT
SUT SILK 3 0SH CR/8 30 (SUTURE) IMPLANT
SUT VIC AB 1 CTX 18 (SUTURE) IMPLANT
SUT VIC AB 1 CTX 36 (SUTURE) ×8
SUT VIC AB 1 CTX36XBRD ANBCTR (SUTURE) ×2 IMPLANT
SUT VIC AB 2-0 CTX 36 (SUTURE) ×4 IMPLANT
SUT VIC AB 3-0 MH 27 (SUTURE) IMPLANT
SUT VIC AB 3-0 X1 27 (SUTURE) ×8 IMPLANT
SUT VICRYL 0 TIES 12 18 (SUTURE) ×4 IMPLANT
SUT VICRYL 0 UR6 27IN ABS (SUTURE) ×8 IMPLANT
SUT VICRYL 2 TP 1 (SUTURE) ×6 IMPLANT
SWAB CULTURE ESWAB REG 1ML (MISCELLANEOUS) IMPLANT
SYR 10ML LL (SYRINGE) ×4 IMPLANT
SYR 20CC LL (SYRINGE) ×8 IMPLANT
SYR 20ML LL LF (SYRINGE) IMPLANT
SYR 50ML LL SCALE MARK (SYRINGE) ×4 IMPLANT
SYSTEM RETRIEVAL ANCHOR 15 (MISCELLANEOUS) ×2 IMPLANT
SYSTEM SAHARA CHEST DRAIN ATS (WOUND CARE) ×4 IMPLANT
TAPE CLOTH 4X10 WHT NS (GAUZE/BANDAGES/DRESSINGS) ×4 IMPLANT
TAPE CLOTH SURG 4X10 WHT LF (GAUZE/BANDAGES/DRESSINGS) ×2 IMPLANT
TIP APPLICATOR SPRAY EXTEND 16 (VASCULAR PRODUCTS) IMPLANT
TIP SPRAY PROGEL 11IN (MISCELLANEOUS) IMPLANT
TOWEL GREEN STERILE (TOWEL DISPOSABLE) ×8 IMPLANT
TRAY FOLEY MTR SLVR 16FR STAT (SET/KITS/TRAYS/PACK) ×4 IMPLANT
TRAY FOLEY SLVR 16FR LF STAT (SET/KITS/TRAYS/PACK) ×4 IMPLANT
TUBING EXTENTION W/L.L. (IV SETS) IMPLANT
WATER STERILE IRR 1000ML POUR (IV SOLUTION) ×8 IMPLANT

## 2020-11-13 NOTE — Anesthesia Procedure Notes (Signed)
Procedure Name: Intubation Date/Time: 11/13/2020 11:58 AM Performed by: Vonna Drafts, CRNA Pre-anesthesia Checklist: Patient identified, Emergency Drugs available, Suction available and Patient being monitored Patient Re-evaluated:Patient Re-evaluated prior to induction Oxygen Delivery Method: Circle system utilized Preoxygenation: Pre-oxygenation with 100% oxygen Induction Type: IV induction Ventilation: Mask ventilation without difficulty Laryngoscope Size: Mac and 4 Grade View: Grade I Tube type: Oral Endobronchial tube: Double lumen EBT and 35 Fr Number of attempts: 1 Airway Equipment and Method: Stylet and Oral airway Placement Confirmation: ETT inserted through vocal cords under direct vision, positive ETCO2 and breath sounds checked- equal and bilateral Secured at: 28 cm Tube secured with: Tape Dental Injury: Teeth and Oropharynx as per pre-operative assessment

## 2020-11-13 NOTE — Anesthesia Procedure Notes (Signed)
Arterial Line Insertion Start/End10/31/2022 10:00 AM, 11/13/2020 10:05 AM  Patient location: Pre-op. Preanesthetic checklist: patient identified, IV checked, site marked, risks and benefits discussed, surgical consent, monitors and equipment checked, pre-op evaluation, timeout performed and anesthesia consent Lidocaine 1% used for infiltration Left, radial was placed Catheter size: 20 G Hand hygiene performed  and maximum sterile barriers used   Attempts: 1 Procedure performed without using ultrasound guided technique. Following insertion, dressing applied and Biopatch. Post procedure assessment: normal and unchanged  Patient tolerated the procedure well with no immediate complications.

## 2020-11-13 NOTE — Transfer of Care (Signed)
Immediate Anesthesia Transfer of Care Note  Patient: [name]  Procedure(s) Performed: XI ROBOTIC ASSISTED THORASCOPY-LEFT LOWER LOBECTOMY (Left: Chest) INTERCOSTAL NERVE BLOCK (Left) LYMPH NODE DISSECTION (Left)  Patient Location: PACU  Anesthesia Type:General  Level of Consciousness: drowsy and patient cooperative  Airway & Oxygen Therapy: Patient Spontanous Breathing  Post-op Assessment: Report given to RN and Post -op Vital signs reviewed and stable  Post vital signs: Reviewed and stable  Last Vitals:  Vitals Value Taken Time  BP 128/54 11/13/20 1712  Temp    Pulse 73 11/13/20 1715  Resp 19 11/13/20 1715  SpO2 98 % 11/13/20 1715  Vitals shown include unvalidated device data.  Last Pain:  Vitals:   11/13/20 0903  TempSrc:   PainSc: 0-No pain         Complications: No notable events documented.

## 2020-11-13 NOTE — Interval H&P Note (Signed)
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  The various methods of treatment have been discussed with the patient and family. After consideration of risks, benefits and other options for treatment, the patient has consented to  Procedure(s): XI ROBOTIC ASSISTED THORASCOPY-LEFT LOWER LOBECTOMY, possible thoracotomy (Left) possible THORACOTOMY (Left) as a surgical intervention.  The patient's history has been reviewed, patient examined, no change in status, stable for surgery.  I have reviewed the patient's chart and labs.  Questions were answered to the patient's satisfaction.     Melrose Nakayama

## 2020-11-13 NOTE — Brief Op Note (Addendum)
11/13/2020  4:49 PM  PATIENT:  [name]  80 y.o. 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 Blake drain in left pleural space    LOCAL MEDICATIONS USED:  Exparel intercostal block  SPECIMEN:  left lower lung lobe, multiple lymph nodes  DISPOSITION OF SPECIMEN:  PATHOLOGY  COUNTS:  YES  DICTATION: .Dragon Dictation  PLAN OF CARE: Admit to inpatient   PATIENT DISPOSITION:  PACU - hemodynamically stable.   Delay start of Pharmacological VTE agent (>24hrs) due to surgical blood loss or risk of bleeding: yes

## 2020-11-14 ENCOUNTER — Encounter (HOSPITAL_COMMUNITY): Payer: Self-pay | Admitting: Thoracic Surgery (Cardiothoracic Vascular Surgery)

## 2020-11-14 ENCOUNTER — Other Ambulatory Visit: Payer: Medicare Other

## 2020-11-14 ENCOUNTER — Inpatient Hospital Stay (HOSPITAL_COMMUNITY): Payer: Medicare Other

## 2020-11-14 LAB — BASIC METABOLIC PANEL
Anion gap: 4 — ABNORMAL LOW (ref 5–15)
BUN: 16 mg/dL (ref 8–23)
CO2: 27 mmol/L (ref 22–32)
Calcium: 7.9 mg/dL — ABNORMAL LOW (ref 8.9–10.3)
Chloride: 103 mmol/L (ref 98–111)
Creatinine, Ser: 0.89 mg/dL (ref 0.44–1.00)
GFR, Estimated: >60 mL/min (ref >60)
Glucose, Bld: 163 mg/dL — ABNORMAL HIGH (ref 70–99)
Potassium: 3.5 mmol/L (ref 3.5–5.1)
Sodium: 134 mmol/L — ABNORMAL LOW (ref 135–145)

## 2020-11-14 LAB — CBC
HCT: 30.4 % — ABNORMAL LOW (ref 36.0–46.0)
Hemoglobin: 10.2 g/dL — ABNORMAL LOW (ref 12.0–15.0)
MCH: 33.3 pg (ref 26.0–34.0)
MCHC: 33.6 g/dL (ref 30.0–36.0)
MCV: 99.3 fL (ref 80.0–100.0)
Platelets: 157 10*3/uL (ref 150–400)
RBC: 3.06 MIL/uL — ABNORMAL LOW (ref 3.87–5.11)
RDW: 13.7 % (ref 11.5–15.5)
WBC: 8.6 10*3/uL (ref 4.0–10.5)
nRBC: 0 % (ref 0.0–0.2)

## 2020-11-14 MED ORDER — CLOPIDOGREL BISULFATE 75 MG PO TABS
75.0000 mg | ORAL_TABLET | Freq: Every day | ORAL | Status: DC
  Administered 2020-11-15 – 2020-11-22 (×8): 75 mg via ORAL
  Filled 2020-11-14 (×9): qty 1

## 2020-11-14 MED ORDER — POTASSIUM CHLORIDE 10 MEQ/50ML IV SOLN
10.0000 meq | INTRAVENOUS | Status: AC
  Administered 2020-11-14 (×4): 10 meq via INTRAVENOUS
  Filled 2020-11-14: qty 50

## 2020-11-14 NOTE — Op Note (Signed)
NAMESONNY, POTH MEDICAL RECORD NO: [id number] ACCOUNT NO: [id number] DATE OF BIRTH: 1940/11/21 FACILITY: [facility] LOCATION: [facility] PHYSICIAN: Revonda Standard. Roxan Hockey, MD  Operative Report   DATE OF PROCEDURE: 11/13/2020  PREOPERATIVE DIAGNOSIS:  Squamous cell carcinoma, left lower lobe, clinical stage IIB (T3, N0).  POSTOPERATIVE DIAGNOSIS:  Squamous cell carcinoma, left lower lobe, clinical stage IIB (T3, N0).  PROCEDURE PERFORMED:   Xi robotic-assisted extrapleural left lower lobectomy,  Lymph node dissection and  Intercostal nerve blocks levels 3 through 10.  SURGEON:  Revonda Standard. Roxan Hockey, MD.  ASSISTANT: Enid Cutter, PA  ANESTHESIA:  General.  FINDINGS:  Mass in the left lower lobe with adhesions to the pleura.  No invasion of the ribs.  Frozen section revealed bronchial margin was clear, but there was tumor noted in peribronchial lymphatics.  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  She has been reluctant to pursue surgery, but ultimately  agreed to proceed.  The indications, risks, benefits, and alternatives were discussed in detail with the patient.  She understood and accepted the risks and agreed to proceed.  OPERATIVE NOTE:  [name] was brought to the preoperative holding area on 11/13/2020.  Anesthesia placed a central venous catheter and an arterial blood pressure monitoring line. She was taken to the operating room, anesthetized and intubated with a double-lumen endotracheal tube.  A Foley catheter was placed.  Intravenous antibiotics were administered.  Sequential compression devices were placed on the calves for DVT prophylaxis.  She was placed in a right lateral decubitus position.  A Bair hugger was placed for active warming.  The left chest was prepped and draped in the usual sterile fashion.  Single  lung ventilation of the right lung was initiated and was tolerated well  throughout the procedure.  A timeout was performed.  A solution containing 20 mL of liposomal bupivacaine, 30 mL of 0.5% bupivacaine and 50 mL of saline was prepared.  This was used for local at the skin incisions as well as for the intercostal nerve blocks. An incision was  made in the 8th interspace.  An 8 mm robotic port was inserted.  The thoracoscope was advanced into the chest after confirming intrapleural placement.  Carbon dioxide was insufflated per protocol.  A 12 mm port was placed in the 8th interspace anterior to  the camera port.  Inspection showed some adhesions of the upper lobe to the lateral chest wall.  These were thin and filmy.  There were adhesions anteriorly as well. Posteriorly, it appeared there were adhesions to the pleura around the area of the  tumor.  Intercostal nerve blocks were performed from the 3rd to the 10th interspace.  10 mL of the bupivacaine solution was injected into a subpleural plane at each level.  Two additional robotic ports were placed.  A 12-mm port was placed posterior to  the camera port in the 8th interspace and then further posteriorly in the 9th interspace, an 8 mm port was inserted.  The 8th interspace was not available due to tumor adherence to the chest wall.  A 12 mm AirSeal port was placed in the 10th interspace  posterolaterally. The robot was deployed.  The camera arm was docked.  Targeting was performed.  The robotic instruments were inserted with thoracoscopic visualization.  Adhesions of the upper lobe to the lateral chest wall were taken  down with bipolar  cautery.  Again, these were thin filmy adhesions.  No attempt was made to take down the adhesions along the mediastinum near the path of the left internal mammary artery.  Some of the adhesions from the posterior lower lobe to the chest wall then were  taken down, but as this progressed, it was concerning that there was  actual invasion of the pleura.  A plane was developed extra-pleurally and part of the dissection was performed.  The inferior ligament was divided. Level 9 lymph nodes were removed.   The remainder of the adhesions then were taken down posteriorly and the extrapleural portion of the procedure was completed using bipolar cautery.  The pleural reflection was divided at the hilum posteriorly and level 7 nodes were removed.  There was a  large calcified node in the subcarinal space as well as smaller less calcified nodes.  Dissection was carried onto the pulmonary artery posteriorly.  Dissecting further superiorly, the aortopulmonary window was not accessible due to adhesions of the  upper lobe. Anteriorly, there were adhesions to the pericardium. These were taken down.  Care was taken not to use cautery in the vicinity of the phrenic nerve.  There were adhesions in the major fissure, but these mostly came apart very easily with  blunt dissection using 2 sponges.  A relatively large level 12 node was identified in the fissure.  The pleura was incised over this node and it was removed.  The pulmonary artery branches were visible.  Completion of the fissure then was performed with  sequential firings of the robotic stapler. A 45 mm stapler was used.  Blue cartridges were used on the parenchyma. After completing the anterior portion of the fissure, a large level 11 node was removed. The posterior portion of the fissure then was  completed.  Additional nodes were removed around the branches of the pulmonary artery.  There was a large basilar trunk, which was encircled and divided with the vascular stapler.  There were two branches to the superior segment, which were  not divided at this time.  The inferior pulmonary vein was encircled and divided with the vascular stapler.  The remaining 2 pulmonary arterial branches then were divided using the stapler.  Stapler then was placed across the left lower lobe bronchus at   its origin and closed.  A test inflation showed good aeration of the left upper lobe.  The stapler was fired transecting the bronchus.  The vessel loop and sponges that had been placed during the procedure were removed.  The robotic instruments were  removed.  The robot was undocked.  The anterior 8th interspace incision was lengthened to approximately 6 cm.  A 15 mm endoscopic retrieval bag was placed into the chest and the lower lobe was manipulated into this bag.  It was removed and sent for  frozen section of the bronchial margin. While waiting for the results of the frozen section, the chest was copiously irrigated with warm saline.  A test inflation showed some air leakage along the fissure along the staple line.  This area was re-stapled  using a Covidien stapler black cartridge with reinforcement used to staple this area.  Progel also was applied to that area.  Frozen section returned showing no tumor of the bronchial margin itself, but there was some tumor in the peribronchial lymphatic  tissue.  Inspection of the bronchial stump revealed that there was no way to resect additional bronchus without potentially compromising the left  upper lobe bronchus.  There was no residual lymph node tissue at the bronchial stump.  A 28-French Blake  drain was placed through the original port incision and secured with a #1 silk suture.  The left upper lobe was reinflated.  Prior to reinflating the lung, the parietal pleural resection margins were marked with clips.  The left lung was reinflated.  The  incisions were closed in standard fashion. The small thoracotomy incision was closed with a #2 Vicryl pericostal suture prior to closing the remaining wound in a layered fashion.  All sponge, needle and instrument counts were correct at the end of the  procedure. The patient was extubated in the operating room and taken to the [facility] in good condition.        PAA D: 11/13/2020 6:18:02 pm T:  11/14/2020 12:15:00 am  JOB: [id number]/ [id number]

## 2020-11-14 NOTE — Plan of Care (Signed)

## 2020-11-14 NOTE — Progress Notes (Signed)
      [address]       [name],[name] [id number]             [phone]      1 Day Post-Op Procedure(s) (LRB): XI ROBOTIC ASSISTED THORASCOPY-LEFT LOWER LOBECTOMY (Left) INTERCOSTAL NERVE BLOCK (Left) LYMPH NODE DISSECTION (Left) Subjective: Awake and alert, pain is controlled.  Says she is hungry  Objective: Vital signs in last 24 hours: Temp:  [97 F (36.1 C)-98.2 F (36.8 C)] 98 F (36.7 C) (11/01 0435) Pulse Rate:  [60-73] 68 (11/01 0435) Cardiac Rhythm: Normal sinus rhythm (11/01 0700) Resp:  [10-19] 18 (11/01 0435) BP: (116-140)/(52-71) 116/56 (11/01 0435) SpO2:  [96 %-100 %] 100 % (11/01 0435) Arterial Line BP: (160-172)/(49-58) 160/49 (10/31 1800) Weight:  [51.7 kg] 51.7 kg (10/31 0845)     Intake/Output from previous day: 10/31 0701 - 11/01 0700 In: 2453.8 [I.V.:2253.8; IV Piggyback:200] Out: 419 [Urine:360; Chest Tube:314] Intake/Output this shift: No intake/output data recorded.  General appearance: alert, cooperative, and no distress Neurologic: intact Heart: regular rate and rhythm Lungs: Audible air leak but otherwise clear.  Chest tube drainage about 300 mL since surgery.  There is an active air leak.  Chest x-ray shows both lungs to be well expanded.  Chest tube is in good position. Abdomen: Soft and nontender Wound: Port incisions are dry.  Lab Results: Recent Labs    11/14/20 0430  WBC 8.6  HGB 10.2*  HCT 30.4*  PLT 157   BMET:  Recent Labs    11/14/20 0430  NA 134*  K 3.5  CL 103  CO2 27  GLUCOSE 163*  BUN 16  CREATININE 0.89  CALCIUM 7.9*    PT/INR: No results for input(s): LABPROT, INR in the last 72 hours. ABG    Component Value Date/Time   PHART 7.388 11/09/2020 1116   HCO3 25.2 11/09/2020 1116   TCO2 23 05/03/2020 2243   ACIDBASEDEF 1.6 07/24/2020 2245   O2SAT 97.1 11/09/2020 1116   CBG (last 3)  No results for input(s): GLUCAP in the last 72 hours.  Assessment/Plan: S/P Procedure(s) (LRB): XI  ROBOTIC ASSISTED THORASCOPY-LEFT LOWER LOBECTOMY (Left) INTERCOSTAL NERVE BLOCK (Left) LYMPH NODE DISSECTION (Left)  -Postop day 1 robotic assisted left lower lobectomy for stage IIb squamous cell carcinoma.  Stable respiratory status.  Active air leak, chest tube drainage is tapering off.  Likely patient to waterseal today.  DC Foley catheter.  Mobilize.  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  She is followed by the cardiology team.  Resume aspirin, atorvastatin, Entresto, and Lasix.  -Expected acute blood loss anemia-mild.  Monitor.  -DVT prophylaxis-to start subcu enoxaparin later today.  Mobilize.   LOS: 1 day    Antony Odea, PA-C [phone] 11/14/2020

## 2020-11-14 NOTE — Anesthesia Postprocedure Evaluation (Signed)
Anesthesia Post Note  Patient: [name]  Procedure(s) Performed: XI ROBOTIC ASSISTED THORASCOPY-LEFT LOWER LOBECTOMY (Left: Chest) INTERCOSTAL NERVE BLOCK (Left) LYMPH NODE DISSECTION (Left)     Patient location during evaluation: PACU Anesthesia Type: General Level of consciousness: awake and alert Pain management: pain level controlled Vital Signs Assessment: post-procedure vital signs reviewed and stable Respiratory status: spontaneous breathing, nonlabored ventilation, respiratory function stable and patient connected to nasal cannula oxygen Cardiovascular status: blood pressure returned to baseline and stable Postop Assessment: no apparent nausea or vomiting Anesthetic complications: no   No notable events documented.  Last Vitals:  Vitals:   11/13/20 2330 11/14/20 0435  BP: 124/60 (!) 116/56  Pulse: 70 68  Resp: 17 18  Temp: 36.8 C 36.7 C  SpO2: 99% 100%    Last Pain:  Vitals:   11/14/20 0438  TempSrc:   PainSc: 0-No pain                 Rajni Holsworth S

## 2020-11-14 NOTE — Hospital Course (Addendum)
PCP:  Derinda Late, MD Referring cardiologist:  Dr. 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  She was found to have a left lower lobe lung mass at the time of her CABG back in April.  She underwent surgery because of her left main disease.  On a follow-up chest x-ray in May the lesion appeared slightly smaller and more cavitary.  We repeated a CT which showed the mass was approximately the same size.  A PET/CT showed the mass was markedly hypermetabolic without evidence of regional or distant metastatic disease.  Navigational bronchoscopy showed squamous cell carcinoma.  I advised her to undergo surgical resection.  Given the size of the tumor she will likely need adjuvant chemotherapy.  The alternative would be to treat with chemo and radiation primarily.  Her best chance of survival is with surgical resection.  She has been reluctant to have surgery but also refused a referral to radiation oncology.  She had an MR of the brain to complete her staging work-up and pulmonary function testing and now returns to discuss those results.   She is not having any anginal pain.  Hospital Course: Was admitted for elective surgery on 11/14/2019.  She was taken to the operating room where robotic assisted left lower lobectomy was carried out without complication.  Following the procedure, she was extubated and recovered in the postanesthesia care unit Transferred to [facility] progressive care.  Respiratory status remained stable.  She had a moderate pneumothorax and chest tube was left on suction overnight following surgery.  Chest tube drainage was moderate but tapered off.  Follow-up chest x-ray satisfactory.  Mobilized on the first postoperative day.  Foley catheter was  removed and IV fluids discontinued.  The air leak s slow so the chest tube was placed to waterseal on postop day 2.  On the following morning, chest x-ray showed moderate left pneumothorax.  For this reason, the chest tube was placed back to suction.  Follow-up chest x-ray showed complete reexpansion of the left lung.  Chest tube was left on suction for another 24 hours then return to waterseal.  Chest x-ray remained stable but she had a persistent small air leak for a few more days. Air lead resolved and chest tube was removed on 11/**. She has been ambulating on room air. All wounds are clean, dry, healing without signs of infection. She is tolerating a diet and has had a bowel movement. Final CXR showed **. She is felt surgically stable for discharge today.

## 2020-11-15 ENCOUNTER — Inpatient Hospital Stay (HOSPITAL_COMMUNITY): Payer: Medicare Other

## 2020-11-15 LAB — COMPREHENSIVE METABOLIC PANEL
ALT: 25 U/L (ref 0–44)
AST: 26 U/L (ref 15–41)
Albumin: 2.5 g/dL — ABNORMAL LOW (ref 3.5–5.0)
Alkaline Phosphatase: 62 U/L (ref 38–126)
Anion gap: 4 — ABNORMAL LOW (ref 5–15)
BUN: 15 mg/dL (ref 8–23)
CO2: 27 mmol/L (ref 22–32)
Calcium: 8.2 mg/dL — ABNORMAL LOW (ref 8.9–10.3)
Chloride: 106 mmol/L (ref 98–111)
Creatinine, Ser: 0.81 mg/dL (ref 0.44–1.00)
GFR, Estimated: >60 mL/min (ref >60)
Glucose, Bld: 111 mg/dL — ABNORMAL HIGH (ref 70–99)
Potassium: 3.7 mmol/L (ref 3.5–5.1)
Sodium: 137 mmol/L (ref 135–145)
Total Bilirubin: 1 mg/dL (ref 0.3–1.2)
Total Protein: 5.5 g/dL — ABNORMAL LOW (ref 6.5–8.1)

## 2020-11-15 LAB — CBC
HCT: 32.1 % — ABNORMAL LOW (ref 36.0–46.0)
Hemoglobin: 10.6 g/dL — ABNORMAL LOW (ref 12.0–15.0)
MCH: 33.3 pg (ref 26.0–34.0)
MCHC: 33 g/dL (ref 30.0–36.0)
MCV: 100.9 fL — ABNORMAL HIGH (ref 80.0–100.0)
Platelets: 125 10*3/uL — ABNORMAL LOW (ref 150–400)
RBC: 3.18 MIL/uL — ABNORMAL LOW (ref 3.87–5.11)
RDW: 14.2 % (ref 11.5–15.5)
WBC: 10 10*3/uL (ref 4.0–10.5)
nRBC: 0 % (ref 0.0–0.2)

## 2020-11-15 MED ORDER — METOPROLOL TARTRATE 25 MG PO TABS
25.0000 mg | ORAL_TABLET | Freq: Two times a day (BID) | ORAL | Status: DC
  Administered 2020-11-15 – 2020-11-21 (×14): 25 mg via ORAL
  Filled 2020-11-15 (×15): qty 1

## 2020-11-15 NOTE — Progress Notes (Signed)
   11/15/20 1330  Vitals  Pulse Rate (!) 111  ECG Heart Rate (!) 112  Resp (!) 24  MEWS COLOR  MEWS Score Color Yellow  Oxygen Therapy  SpO2 91 %  Pain Assessment  Pain Scale 0-10  Pain Score Asleep  MEWS Score  MEWS Temp 0  MEWS Systolic 0  MEWS Pulse 2  MEWS RR 1  MEWS LOC 0  MEWS Score 3  Provider Notification  Provider Name/Title Enid Cutter  Date Provider Notified 11/15/20  Time Provider Notified 1330  Notification Type  (chat)  Notification Reason Change in status;Other (Comment) (sustained tachycardia)  Provider response Other (Comment) (waiting on metoprolol order to be resumed)  Date of Provider Response 11/15/20  Time of Provider Response 1340

## 2020-11-15 NOTE — Plan of Care (Addendum)
Pt medicated for pain at incision site. Chest tube output for dayshift 76ml to water seal. Drainage is sanguineous. CHG bath given. Metoprolol resumed for better rate control. Po is adequate. Fluids encouraged. Skin redness noted to sacrum foam applied to site.  Report will be given to night shift. Safety rounding completed.   Problem: Education: Goal: Knowledge of General Education information will improve Description: Including pain rating scale, medication(s)/side effects and non-pharmacologic comfort measures Outcome: Progressing   Problem: Health Behavior/Discharge Planning: Goal: Ability to manage health-related needs will improve Outcome: Progressing   Problem: Clinical Measurements: Goal: Ability to maintain clinical measurements within normal limits will improve Outcome: Progressing Goal: Will remain free from infection Outcome: Progressing Goal: Diagnostic test results will improve Outcome: Progressing Goal: Respiratory complications will improve Outcome: Progressing Goal: Cardiovascular complication will be avoided Outcome: Progressing

## 2020-11-15 NOTE — Progress Notes (Signed)
   11/15/20 1000  Assess: MEWS Score  BP (!) 115/59  Pulse Rate (!) 103  ECG Heart Rate (!) 105  Resp (!) 22  SpO2 92 %  Assess: MEWS Score  MEWS Temp 0  MEWS Systolic 0  MEWS Pulse 1  MEWS RR 1  MEWS LOC 0  MEWS Score 2  MEWS Score Color Yellow  Assess: if the MEWS score is Yellow or Red  Were vital signs taken at a resting state? Yes  Focused Assessment No change from prior assessment  Early Detection of Sepsis Score *See Row Information* Low  MEWS guidelines implemented *See Row Information* Yes  Treat  MEWS Interventions Escalated (See documentation below)  Take Vital Signs  Increase Vital Sign Frequency  Yellow: Q 2hr X 2 then Q 4hr X 2, if remains yellow, continue Q 4hrs  Escalate  MEWS: Escalate Yellow: discuss with charge nurse/RN and consider discussing with provider and RRT  Notify: Charge Nurse/RN  Name of Charge Nurse/RN Notified Marita [place]  Date Charge Nurse/RN Notified 11/15/20  Time Charge Nurse/RN Notified 1015  Document  Patient Outcome Other (Comment)  Progress note created (see row info) Yes

## 2020-11-15 NOTE — Progress Notes (Addendum)
      [address]       [place] [address]             [phone]      2 Days Post-Op Procedure(s) (LRB): XI ROBOTIC ASSISTED THORASCOPY-LEFT LOWER LOBECTOMY (Left) INTERCOSTAL NERVE BLOCK (Left) LYMPH NODE DISSECTION (Left) Subjective: Awake and alert. Was out of bed to the bathroom a few times yesterday. Appetite is good, less soreness. Sats OK on RA.    Objective: Vital signs in last 24 hours: Temp:  [98.1 F (36.7 C)-98.7 F (37.1 C)] 98.1 F (36.7 C) (11/02 0423) Pulse Rate:  [81-91] 91 (11/02 0423) Cardiac Rhythm: Sinus tachycardia (11/02 0700) Resp:  [18-20] 20 (11/02 0423) BP: (115-129)/(55-59) 129/55 (11/02 0423) SpO2:  [93 %-99 %] 93 % (11/02 0423)     Intake/Output from previous day: 11/01 0701 - 11/02 0700 In: 1083.1 [P.O.:600; I.V.:483.1] Out: 667 [Urine:260; [place]; Chest Tube:405] Intake/Output this shift: No intake/output data recorded.  General appearance: alert, cooperative, and no distress Neurologic: intact Heart: regular rate and rhythm Lungs: breath sounds clear.  Chest tube drainage about 400 mL past 24 hours.Drainae is thin, serous fluid.  There is an active air leak but slower than yesterday.  Chest x-ray shows both lungs to be well expanded.  Chest tube is in good position. CT placed to water seal. Abdomen: Soft and nontender Wound: Port incisions are dry.  Lab Results: Recent Labs    11/14/20 0430 11/15/20 0424  WBC 8.6 10.0  HGB 10.2* 10.6*  HCT 30.4* 32.1*  PLT 157 125*    BMET:  Recent Labs    11/14/20 0430 11/15/20 0424  NA 134* 137  K 3.5 3.7  CL 103 106  CO2 27 27  GLUCOSE 163* 111*  BUN 16 15  CREATININE 0.89 0.81  CALCIUM 7.9* 8.2*     PT/INR: No results for input(s): LABPROT, INR in the last 72 hours. ABG    Component Value Date/Time   PHART 7.388 11/09/2020 1116   HCO3 25.2 11/09/2020 1116   TCO2 23 05/03/2020 2243   ACIDBASEDEF 1.6 07/24/2020 2245   O2SAT 97.1 11/09/2020 1116    CBG (last 3)  No results for input(s): GLUCAP in the last 72 hours.  Assessment/Plan: S/P Procedure(s) (LRB): XI ROBOTIC ASSISTED THORASCOPY-LEFT LOWER LOBECTOMY (Left) INTERCOSTAL NERVE BLOCK (Left) LYMPH NODE DISSECTION (Left)  -Postop day 2 robotic assisted left lower lobectomy for stage IIb squamous cell carcinoma.  Stable respiratory status.  Less air leak today, chest tube drainage moderate but is serous.   CT to water seal, advance activity.   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  She is followed by the cardiology team.  Continue aspirin, atorvastatin, Entresto, and Lasix.    -Expected acute blood loss anemia-mild.  Monitor.  -DVT prophylaxis-to start subcu enoxaparin later today.  Advance activity.   LOS: 2 days    Malon Kindle [phone] 11/15/2020  Patient seen and examined, agree with above Will try CT on water seal Path pending Ambulate   Remo Lipps C. Roxan Hockey, MD [facility] [phone]

## 2020-11-15 NOTE — Plan of Care (Signed)

## 2020-11-16 ENCOUNTER — Inpatient Hospital Stay (HOSPITAL_COMMUNITY): Payer: Medicare Other

## 2020-11-16 LAB — BASIC METABOLIC PANEL
Anion gap: 4 — ABNORMAL LOW (ref 5–15)
BUN: 13 mg/dL (ref 8–23)
CO2: 28 mmol/L (ref 22–32)
Calcium: 8.1 mg/dL — ABNORMAL LOW (ref 8.9–10.3)
Chloride: 103 mmol/L (ref 98–111)
Creatinine, Ser: 0.82 mg/dL (ref 0.44–1.00)
GFR, Estimated: >60 mL/min (ref >60)
Glucose, Bld: 93 mg/dL (ref 70–99)
Potassium: 3.8 mmol/L (ref 3.5–5.1)
Sodium: 135 mmol/L (ref 135–145)

## 2020-11-16 NOTE — Care Management Important Message (Signed)
Important Message  Patient Details  Name: [name] MRN: [id number] Date of Birth: 12/05/1940   Medicare Important Message Given:  Yes     Christie Viscomi Montine Circle 11/16/2020, 2:50 PM

## 2020-11-16 NOTE — Progress Notes (Addendum)
[address]       [place] [address]             [phone]      3 Days Post-Op Procedure(s) (LRB): XI ROBOTIC ASSISTED THORASCOPY-LEFT LOWER LOBECTOMY (Left) INTERCOSTAL NERVE BLOCK (Left) LYMPH NODE DISSECTION (Left) Subjective: Just finishing breakfast.  [name] says she is feeling much better overall.  She noted much less soreness with maneuvering for the chest x-ray earlier this morning.  She denies shortness of breath.  Sats OK on RA.    Objective: Vital signs in last 24 hours: Temp:  [97.6 F (36.4 C)-98.6 F (37 C)] 97.7 F (36.5 C) (11/03 0347) Pulse Rate:  [67-115] 67 (11/03 0347) Cardiac Rhythm: Normal sinus rhythm (11/03 0700) Resp:  [17-25] 20 (11/03 0347) BP: (99-129)/(49-63) 99/50 (11/03 0347) SpO2:  [91 %-98 %] 98 % (11/03 0347)     Intake/Output from previous day: 11/02 0701 - 11/03 0700 In: 270.7 [I.V.:270.7] Out: 1510 [Urine:1350; Chest Tube:160] Intake/Output this shift: No intake/output data recorded.  General appearance: alert, cooperative, and no distress Neurologic: intact Heart: regular rate and rhythm Lungs: breath sounds clear.  Chest tube drainage has tapered down to 160 mL past 24 hours. Large air leak with cough but minimal air leak with spontaneous breathing.  Chest x-ray shows moderate apical and lateral pneumothorax this morning chest tube is in good position. CT placed to -20 cm water suction.  After initial evacuation of air, there was a small intermittent air leak. Abdomen: Soft and nontender Wound: Port incisions are dry.  Lab Results: Recent Labs    11/14/20 0430 11/15/20 0424  WBC 8.6 10.0  HGB 10.2* 10.6*  HCT 30.4* 32.1*  PLT 157 125*    BMET:  Recent Labs    11/15/20 0424 11/16/20 0345  NA 137 135  K 3.7 3.8  CL 106 103  CO2 27 28  GLUCOSE 111* 93  BUN 15 13  CREATININE 0.81 0.82  CALCIUM 8.2* 8.1*     PT/INR: No results for input(s): LABPROT, INR in the last 72 hours. ABG     Component Value Date/Time   PHART 7.388 11/09/2020 1116   HCO3 25.2 11/09/2020 1116   TCO2 23 05/03/2020 2243   ACIDBASEDEF 1.6 07/24/2020 2245   O2SAT 97.1 11/09/2020 1116   CBG (last 3)  No results for input(s): GLUCAP in the last 72 hours.  Assessment/Plan: S/P Procedure(s) (LRB): XI ROBOTIC ASSISTED THORASCOPY-LEFT LOWER LOBECTOMY (Left) INTERCOSTAL NERVE BLOCK (Left) LYMPH NODE DISSECTION (Left)  -Postop day 3 robotic assisted left lower lobectomy for stage IIb squamous cell carcinoma.  Path: 5.9cm moderately differentiated squamous cell carcinoma invasive to parietal pleura.  Two level 12 lymph nodes were positive for tumor.  Pathologic Stage Classification : pT3, pN1   Stable respiratory status.  Moderate pneumothorax on chest x-ray this morning while on waterseal for the past 24 hours.  Chest tube was placed back to -20 cm water suction.  Residual air leak is small.  May be able to reduce suction to 10 cm.  Advance activity today.  We will work on obtaining a peripheral IV and remove the central line.  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  She is followed by the cardiology team.  Continue aspirin, atorvastatin, Entresto, and Lasix.    -Expected acute blood loss anemia-mild.  Monitor.  -GI-tolerating diet.  BM  yesterday  -DVT prophylaxis-to start subcu enoxaparin later today.  Advance activity.   LOS: 3 days    Antony Odea, [place] [phone] 11/16/2020 Patient seen and examined. Looks great Lung dropped on water seal, now back on suction with small air leak Will keep on suction today Ambulate  Remo Lipps C. Roxan Hockey, MD [facility] [phone]

## 2020-11-17 ENCOUNTER — Inpatient Hospital Stay (HOSPITAL_COMMUNITY): Payer: Medicare Other

## 2020-11-17 LAB — BPAM RBC
Blood Product Expiration Date: 202211272359
Blood Product Expiration Date: 202211282359
Unit Type and Rh: 5100
Unit Type and Rh: 5100

## 2020-11-17 LAB — TYPE AND SCREEN
ABO/RH(D): O POS
Antibody Screen: NEGATIVE
Unit division: 0
Unit division: 0

## 2020-11-17 MED ORDER — CYCLOBENZAPRINE HCL 10 MG PO TABS
5.0000 mg | ORAL_TABLET | Freq: Three times a day (TID) | ORAL | Status: DC | PRN
  Administered 2020-11-18 (×2): 5 mg via ORAL
  Filled 2020-11-17 (×2): qty 1

## 2020-11-17 NOTE — Progress Notes (Signed)
Mobility Specialist Progress Note    11/17/20 1601  Mobility  Activity Contraindicated/medical hold   Pt feeling exhausted and having some pain.   Hildred Alamin Mobility Specialist  Mobility Specialist Phone: [phone]

## 2020-11-17 NOTE — TOC Progression Note (Signed)
Transition of Care [facility]) - Progression Note    Patient Details  Name: [name] MRN: [id number] Date of Birth: [birth year]  Transition of Care [facility]) CM/SW Contact  Zenon Mayo, RN Phone Number: 11/17/2020, 2:39 PM  Clinical Narrative:    POD 4 sp lobectomy, Stable respiratory status.  No significant pneumothorax on chest x-ray this morning. Will leave on -20 cm water suction for now.  Residual air leak is small.  TOC will continue to follow for dc needs.         Expected Discharge Plan and Services                                                 Social Determinants of Health (SDOH) Interventions    Readmission Risk Interventions Readmission Risk Prevention Plan 05/09/2020  Post Dischage Appt Complete  Medication Screening Complete  Transportation Screening Complete  Some recent data might be hidden

## 2020-11-17 NOTE — Discharge Instructions (Addendum)

## 2020-11-17 NOTE — Progress Notes (Addendum)
[address]       [place] [address]             [phone]      4 Days Post-Op Procedure(s) (LRB): XI ROBOTIC ASSISTED THORASCOPY-LEFT LOWER LOBECTOMY (Left) INTERCOSTAL NERVE BLOCK (Left) LYMPH NODE DISSECTION (Left) Subjective:  Had episode of sharp pain in her left chest yesterday that eventually resolved with repositioning.  Says she was comfortable overnight. She denies shortness of breath.  Sats OK on RA.    Objective: Vital signs in last 24 hours: Temp:  [97.6 F (36.4 C)-98.6 F (37 C)] 97.6 F (36.4 C) (11/04 0346) Pulse Rate:  [68-82] 68 (11/04 0346) Cardiac Rhythm: Normal sinus rhythm (11/04 0400) Resp:  [18-22] 20 (11/04 0346) BP: (96-128)/(49-83) 121/52 (11/04 0346) SpO2:  [99 %-100 %] 100 % (11/04 0346)     Intake/Output from previous day: 11/03 0701 - 11/04 0700 In: 125.5 [I.V.:125.5] Out: 200 [Chest Tube:200] Intake/Output this shift: No intake/output data recorded.  General appearance: alert, cooperative, and no distress Neurologic: intact Heart: regular rate and rhythm Lungs: breath sounds clear.  Chest tube drainage has tapered down to 200 mL past 24 hours. Small air leak with cough and occasional air bubble with spontaneous breathing.  Chest x-ray shows a small rim of air adjacent to the CT insertion site. The left lung is otherwise fully expanded.  Chest tube is in good position. CT is on -20 cm water suction.   Abdomen: Soft and nontender Wound: Port incisions are dry and the chest tube dressing is secure.  Lab Results: Recent Labs    11/15/20 0424  WBC 10.0  HGB 10.6*  HCT 32.1*  PLT 125*    BMET:  Recent Labs    11/15/20 0424 11/16/20 0345  NA 137 135  K 3.7 3.8  CL 106 103  CO2 27 28  GLUCOSE 111* 93  BUN 15 13  CREATININE 0.81 0.82  CALCIUM 8.2* 8.1*     PT/INR: No results for input(s): LABPROT, INR in the last 72 hours. ABG    Component Value Date/Time   PHART 7.388 11/09/2020 1116    HCO3 25.2 11/09/2020 1116   TCO2 23 05/03/2020 2243   ACIDBASEDEF 1.6 07/24/2020 2245   O2SAT 97.1 11/09/2020 1116   CBG (last 3)  No results for input(s): GLUCAP in the last 72 hours.  Assessment/Plan: S/P Procedure(s) (LRB): XI ROBOTIC ASSISTED THORASCOPY-LEFT LOWER LOBECTOMY (Left) INTERCOSTAL NERVE BLOCK (Left) LYMPH NODE DISSECTION (Left)  -Postop day 3 robotic assisted left lower lobectomy for stage IIb squamous cell carcinoma.  Path: 5.9cm moderately differentiated squamous cell carcinoma invasive to parietal pleura.  Two level 12 lymph nodes were positive for tumor.  Pathologic Stage Classification : pT3, pN1   Stable respiratory status.  No significant pneumothorax on chest x-ray this morning. Will leave on -20 cm water suction for now.  Residual air leak is small.    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  She is followed by the cardiology team.  Continue aspirin, atorvastatin, Entresto, and Lasix.  Check BMP in AM.   -Expected acute blood loss anemia-mild.  Monitor.  -GI-tolerating diet.    -DVT prophylaxis- on subcu enoxaparin. Ambulate   LOS: 4 days    Antony Odea, [place] [phone] 11/17/2020 Patient seen and examined.  Agree with assessment and plan as noted above. Lung reexpanded with  chest tube to suction.  Minimal air leak this morning.  We will leave on suction again today.  Otherwise doing well and looks much better than she did a couple of days ago.  Revonda Standard Roxan Hockey, MD [facility] [phone]

## 2020-11-17 NOTE — Plan of Care (Signed)
  Problem: Education: Goal: Knowledge of General Education information will improve Description: Including pain rating scale, medication(s)/side effects and non-pharmacologic comfort measures Outcome: Progressing   Problem: Health Behavior/Discharge Planning: Goal: Ability to manage health-related needs will improve Outcome: Progressing   Problem: Clinical Measurements: Goal: Ability to maintain clinical measurements within normal limits will improve Outcome: Progressing Goal: Will remain free from infection Outcome: Progressing Goal: Diagnostic test results will improve Outcome: Progressing Goal: Respiratory complications will improve Outcome: Progressing Goal: Cardiovascular complication will be avoided Outcome: Progressing   Problem: Nutrition: Goal: Adequate nutrition will be maintained Outcome: Progressing   Problem: Coping: Goal: Level of anxiety will decrease Outcome: Progressing   Problem: Elimination: Goal: Will not experience complications related to urinary retention Outcome: Progressing   Problem: Pain Managment: Goal: General experience of comfort will improve Outcome: Progressing   Problem: Safety: Goal: Ability to remain free from injury will improve Outcome: Progressing   Problem: Skin Integrity: Goal: Risk for impaired skin integrity will decrease Outcome: Progressing   Problem: Education: Goal: Knowledge of disease or condition will improve Outcome: Progressing Goal: Knowledge of the prescribed therapeutic regimen will improve Outcome: Progressing   Problem: Cardiac: Goal: Will achieve and/or maintain hemodynamic stability Outcome: Progressing   Problem: Clinical Measurements: Goal: Postoperative complications will be avoided or minimized Outcome: Progressing   Problem: Respiratory: Goal: Respiratory status will improve Outcome: Progressing   Problem: Pain Management: Goal: Pain level will decrease Outcome: Progressing   Problem:  Skin Integrity: Goal: Wound healing without signs and symptoms infection will improve Outcome: Progressing

## 2020-11-18 ENCOUNTER — Inpatient Hospital Stay (HOSPITAL_COMMUNITY): Payer: Medicare Other

## 2020-11-18 LAB — BASIC METABOLIC PANEL
Anion gap: 9 (ref 5–15)
BUN: 16 mg/dL (ref 8–23)
CO2: 25 mmol/L (ref 22–32)
Calcium: 8.3 mg/dL — ABNORMAL LOW (ref 8.9–10.3)
Chloride: 100 mmol/L (ref 98–111)
Creatinine, Ser: 0.68 mg/dL (ref 0.44–1.00)
GFR, Estimated: >60 mL/min (ref >60)
Glucose, Bld: 115 mg/dL — ABNORMAL HIGH (ref 70–99)
Potassium: 4 mmol/L (ref 3.5–5.1)
Sodium: 134 mmol/L — ABNORMAL LOW (ref 135–145)

## 2020-11-18 NOTE — Progress Notes (Signed)
5 Days Post-Op Procedure(s) (LRB): XI ROBOTIC ASSISTED THORASCOPY-LEFT LOWER LOBECTOMY (Left) INTERCOSTAL NERVE BLOCK (Left) LYMPH NODE DISSECTION (Left) Subjective: C/o pain in upper back  Objective: Vital signs in last 24 hours: Temp:  [97.6 F (36.4 C)-98.3 F (36.8 C)] 98.3 F (36.8 C) (11/05 0757) Pulse Rate:  [68-88] 88 (11/05 0757) Cardiac Rhythm: Normal sinus rhythm;Bundle branch block (11/05 0709) Resp:  [16-24] 20 (11/05 0757) BP: (107-125)/(48-68) 112/68 (11/05 0757) SpO2:  [94 %-97 %] 97 % (11/05 0757)  Hemodynamic parameters for last 24 hours:    Intake/Output from previous day: 11/04 0701 - 11/05 0700 In: -  Out: 2992 [Urine:1150; Chest Tube:185] Intake/Output this shift: No intake/output data recorded.  General appearance: alert, cooperative, and no distress Neurologic: intact Heart: regular rate and rhythm Lungs: diminished breath sounds left base Abdomen: normal findings: soft, non-tender + small air leak  Lab Results: No results for input(s): WBC, HGB, HCT, PLT in the last 72 hours. BMET:  Recent Labs    11/16/20 0345 11/18/20 0039  NA 135 134*  K 3.8 4.0  CL 103 100  CO2 28 25  GLUCOSE 93 115*  BUN 13 16  CREATININE 0.82 0.68  CALCIUM 8.1* 8.3*    PT/INR: No results for input(s): LABPROT, INR in the last 72 hours. ABG    Component Value Date/Time   PHART 7.388 11/09/2020 1116   HCO3 25.2 11/09/2020 1116   TCO2 23 05/03/2020 2243   ACIDBASEDEF 1.6 07/24/2020 2245   O2SAT 97.1 11/09/2020 1116   CBG (last 3)  No results for input(s): GLUCAP in the last 72 hours.  Assessment/Plan: S/P Procedure(s) (LRB): XI ROBOTIC ASSISTED THORASCOPY-LEFT LOWER LOBECTOMY (Left) INTERCOSTAL NERVE BLOCK (Left) LYMPH NODE DISSECTION (Left) POD # 5 Small air leak this AM, small basilar space on CXR Will place CT to water seal Tolerating diet Needs to ambulate more Stable from cardiac standpoint   LOS: 5 days    Melrose Nakayama 11/18/2020

## 2020-11-18 NOTE — Plan of Care (Signed)

## 2020-11-18 NOTE — Progress Notes (Signed)
Mobility Specialist Progress Note:   11/18/20 1414  Mobility  Activity Ambulated in hall  Level of Assistance Standby assist, set-up cues, supervision of patient - no hands on  Assistive Device Front wheel [place]  Distance Ambulated (ft) 200 ft  Mobility Ambulated with assistance in hallway  Mobility Response Tolerated well  Mobility performed by Mobility specialist  $Mobility charge 1 Mobility   Pt received in bed willing to participate in mobility. No complaints of pain. Pt returned to bed with call bell in reach and all needs met.   [facility] Health and safety inspector Phone [phone]

## 2020-11-19 ENCOUNTER — Inpatient Hospital Stay (HOSPITAL_COMMUNITY): Payer: Medicare Other

## 2020-11-19 MED ORDER — PANTOPRAZOLE SODIUM 40 MG PO TBEC
40.0000 mg | DELAYED_RELEASE_TABLET | Freq: Every day | ORAL | Status: DC
  Administered 2020-11-19 – 2020-11-21 (×3): 40 mg via ORAL
  Filled 2020-11-19 (×3): qty 1

## 2020-11-19 NOTE — Progress Notes (Signed)
Pt ambulated in a hallway without distress, tolerated well, denies chest pain, minimal output during whole shift, dressing changed using sterile technique, chest tube site is CDI, will continue to monitor the patient  Palma Holter, RN

## 2020-11-19 NOTE — Progress Notes (Signed)
6 Days Post-Op Procedure(s) (LRB): XI ROBOTIC ASSISTED THORASCOPY-LEFT LOWER LOBECTOMY (Left) INTERCOSTAL NERVE BLOCK (Left) LYMPH NODE DISSECTION (Left) Subjective: No complaints but wants to go home  Objective: Vital signs in last 24 hours: Temp:  [97.9 F (36.6 C)-99.5 F (37.5 C)] 98.1 F (36.7 C) (11/06 0739) Pulse Rate:  [76-101] 101 (11/06 0739) Cardiac Rhythm: Normal sinus rhythm;Bundle branch block (11/06 0710) Resp:  [18-24] 18 (11/06 0739) BP: (113-127)/(51-60) 127/58 (11/06 0739) SpO2:  [96 %-99 %] 96 % (11/06 0739)  Hemodynamic parameters for last 24 hours:    Intake/Output from previous day: 11/05 0701 - 11/06 0700 In: -  Out: 1150 [Urine:1050; Chest Tube:100] Intake/Output this shift: No intake/output data recorded.  General appearance: alert, cooperative, and no distress Neurologic: intact Heart: regular rate and rhythm Lungs: diminished breath sounds left base + small air leak with cough  Lab Results: No results for input(s): WBC, HGB, HCT, PLT in the last 72 hours. BMET:  Recent Labs    11/18/20 0039  NA 134*  K 4.0  CL 100  CO2 25  GLUCOSE 115*  BUN 16  CREATININE 0.68  CALCIUM 8.3*    PT/INR: No results for input(s): LABPROT, INR in the last 72 hours. ABG    Component Value Date/Time   PHART 7.388 11/09/2020 1116   HCO3 25.2 11/09/2020 1116   TCO2 23 05/03/2020 2243   ACIDBASEDEF 1.6 07/24/2020 2245   O2SAT 97.1 11/09/2020 1116   CBG (last 3)  No results for input(s): GLUCAP in the last 72 hours.  Assessment/Plan: S/P Procedure(s) (LRB): XI ROBOTIC ASSISTED THORASCOPY-LEFT LOWER LOBECTOMY (Left) INTERCOSTAL NERVE BLOCK (Left) LYMPH NODE DISSECTION (Left) POD #  6 Overall doing well Still has an air leak- CXR showed slightly larger basilar pneumo. On exam expelled a fair amount of air with first couple of coughs then small air leak with repeated cough Continue ambulation   LOS: 6 days    Melrose Nakayama 11/19/2020

## 2020-11-19 NOTE — Discharge Summary (Addendum)
Physician Discharge Summary  Patient ID: [name] MRN: [id number] DOB/AGE: September 19, 1940 80 y.o.  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  Two level 12 lymph nodes were positive for tumor.  Pathologic Path Stage Classification : pT3, pN1  Clinical Stage stage IIIA  PROCEDURE PERFORMED:  Xi robotic-assisted extrapleural left lower lobectomy, lymph node dissection and intercostal nerve blocks at levels 3 through 10.  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  She was found to have a left lower lobe lung mass at the time of her CABG back in April.  She underwent surgery because of her left main disease.  On a follow-up chest x-ray in May the lesion appeared slightly smaller and more cavitary.  We repeated a CT  which showed the mass was approximately the same size.  A PET/CT showed the mass was markedly hypermetabolic without evidence of regional or distant metastatic disease.  Navigational bronchoscopy showed squamous cell carcinoma.  I advised her to undergo surgical resection.  Given the size of the tumor she will likely need adjuvant chemotherapy.  The alternative would be to treat with chemo and radiation primarily.  Her best chance of survival is with surgical resection.  She has been reluctant to have surgery but also refused a referral to radiation oncology.  She had an MR of the brain to complete her staging work-up and pulmonary function testing and now returns to discuss those results.   She is not having any anginal pain.  Hospital Course: Was admitted for elective surgery on 11/14/2019.  She was taken to the operating room where robotic assisted left lower lobectomy was carried out without complication.  Following the procedure, she was extubated and recovered in the postanesthesia care unit Transferred to [facility] progressive care.  Respiratory status remained stable.  She had a moderate pneumothorax and chest tube was left on suction overnight following surgery.  Chest tube drainage was moderate but tapered off.  Follow-up chest x-ray satisfactory.  Mobilized on the first postoperative day.  Foley catheter was removed and IV fluids discontinued.  The air leak s slow so the chest tube was placed to waterseal on postop day 2.  On the following morning, chest x-ray showed moderate left pneumothorax.  For this reason, the chest tube was placed back to suction.  Follow-up chest x-ray showed complete reexpansion of the left lung.  Chest tube was left on suction for another 24 hours then return to waterseal.  Chest x-ray remained stable but she had a persistent small air leak for a few  more days but had resolved by postop day 8.  The chest tube was removed on 11/21/2020.  Follow-up chest x-ray showedno  complicating features and no PTX.  She was felt to be medically stable for discharge to home on this date.  Consults: None  Significant Diagnostic Studies:   CLINICAL DATA:  Chest tube removal.   EXAM: PORTABLE CHEST 1 VIEW   COMPARISON:  11/21/2020   FINDINGS: 0854 hours. Cardiopericardial silhouette is at upper limits of normal for size. Similar left base collapse/consolidation with tiny effusion. Right lung clear. Left chest tube is been removed in the interval with probable tiny residual left pneumothorax. Subcutaneous emphysema again noted left chest wall.   IMPRESSION: Interval removal of left chest tube with probable tiny residual left pneumothorax. Otherwise no substantial change.     Electronically Signed   By: Misty Stanley M.D.   On: 11/21/2020 10:54   Discharge Exam: Blood pressure (!) 106/55, pulse 84, temperature 98 F (36.7 C), temperature source Oral, resp. rate (!) 22, height 5' (1.524 m), weight 51.7 kg, SpO2 98 %.  General appearance: alert, cooperative, and no distress Neurologic: intact Heart: regular rate and rhythm Lungs: diminished breath sounds left base Wound: clean and dry  Disposition: Discharged to home in stable condition   Allergies as of 11/22/2020       Reactions   Ibuprofen Hives   Penicillins Hives   Tolerated Ancef in the past   Aleve [naproxen Sodium] Hives   Avapro  [irbesartan] Rash   Azithromycin Hives   Nickel Itching   Redness, itching and fluid discharge with nickel earrings        Medication List     STOP taking these medications    doxycycline 100 MG tablet Commonly known as: VIBRA-TABS   predniSONE 20 MG tablet Commonly known as: DELTASONE       TAKE these medications    acetaminophen 500 MG tablet Commonly known as: TYLENOL Take 1,000 mg by mouth every 6 (six) hours as needed for moderate pain or headache.   aspirin EC 81 MG tablet Take 81 mg by mouth daily.   atorvastatin 20 MG  tablet Commonly known as: Lipitor Take 1 tablet (20 mg total) by mouth at bedtime.   Calcium 600+D3 Plus Minerals 600-800 MG-UNIT Tabs Take 1 tablet by mouth daily.   chlorpheniramine-HYDROcodone 10-8 MG/5ML Suer Commonly known as: TUSSIONEX Take 5 mLs by mouth every 12 (twelve) hours as needed for cough.   clopidogrel 75 MG tablet Commonly known as: Plavix Take 1 tablet (75 mg total) by mouth daily.   diphenhydrAMINE-zinc acetate cream Commonly known as: BENADRYL Apply 1 application topically daily as needed for itching.   furosemide 40 MG tablet Commonly known as: LASIX Take 1 tablet (40 mg total) by mouth daily.   metoprolol succinate 50 MG 24 hr tablet Commonly known as: TOPROL-XL Take 1 tablet (50 mg total) by mouth daily. Take with or immediately following a meal. What changed: when to take this   multivitamin with minerals Tabs tablet Take 1 tablet by mouth daily.   nitroGLYCERIN 0.4 MG SL tablet Commonly known as: NITROSTAT Place 1 tablet (0.4 mg total) under the tongue every 5 (five) minutes as needed for up to 10 days for chest pain.   potassium chloride 10 MEQ tablet Commonly known as: KLOR-CON Take 1 tablet (10 mEq total) by mouth daily.   sacubitril-valsartan 24-26 MG Commonly known as: ENTRESTO Take 1 tablet by mouth 2 (two) times daily.  traMADol 50 MG tablet Commonly known as: ULTRAM Take 1 tablet (50 mg total) by mouth every 6 (six) hours as needed for up to 7 days for moderate pain (mild pain).        Follow-up Information     Melrose Nakayama, MD Follow up on 12/12/2020.   Specialty: Cardiothoracic Surgery Why: Your appointment is at 1:30 AM.  Please arrive 30 minutes early for chest x-ray to be performed by [facility] located on the first floor of the same building. Contact information: [address] [place] [address] [phone]                 Signed: Antony Odea PA-C 11/22/2020,  8:17 AM

## 2020-11-19 NOTE — Plan of Care (Signed)
  Problem: Education: Goal: Knowledge of General Education information will improve Description: Including pain rating scale, medication(s)/side effects and non-pharmacologic comfort measures Outcome: Progressing   Problem: Health Behavior/Discharge Planning: Goal: Ability to manage health-related needs will improve Outcome: Progressing   Problem: Clinical Measurements: Goal: Ability to maintain clinical measurements within normal limits will improve Outcome: Progressing Goal: Will remain free from infection Outcome: Progressing Goal: Diagnostic test results will improve Outcome: Progressing Goal: Respiratory complications will improve Outcome: Progressing Goal: Cardiovascular complication will be avoided Outcome: Progressing   Problem: Activity: Goal: Risk for activity intolerance will decrease Outcome: Progressing   Problem: Nutrition: Goal: Adequate nutrition will be maintained Outcome: Progressing   Problem: Coping: Goal: Level of anxiety will decrease Outcome: Progressing   Problem: Elimination: Goal: Will not experience complications related to bowel motility Outcome: Progressing Goal: Will not experience complications related to urinary retention Outcome: Progressing   Problem: Pain Managment: Goal: General experience of comfort will improve Outcome: Progressing   Problem: Safety: Goal: Ability to remain free from injury will improve Outcome: Progressing   Problem: Skin Integrity: Goal: Risk for impaired skin integrity will decrease Outcome: Progressing   Problem: Education: Goal: Knowledge of disease or condition will improve Outcome: Progressing Goal: Knowledge of the prescribed therapeutic regimen will improve Outcome: Progressing   Problem: Activity: Goal: Risk for activity intolerance will decrease Outcome: Progressing   Problem: Cardiac: Goal: Will achieve and/or maintain hemodynamic stability Outcome: Progressing   Problem: Clinical  Measurements: Goal: Postoperative complications will be avoided or minimized Outcome: Progressing   Problem: Respiratory: Goal: Respiratory status will improve Outcome: Progressing   Problem: Pain Management: Goal: Pain level will decrease Outcome: Progressing

## 2020-11-20 ENCOUNTER — Inpatient Hospital Stay (HOSPITAL_COMMUNITY): Payer: Medicare Other

## 2020-11-20 NOTE — Progress Notes (Addendum)
      [address]       [place] [address]             [phone]       7 Days Post-Op Procedure(s) (LRB): XI ROBOTIC ASSISTED THORASCOPY-LEFT LOWER LOBECTOMY (Left) INTERCOSTAL NERVE BLOCK (Left) LYMPH NODE DISSECTION (Left)  Subjective: Patient wants sweets and nutrition will not give her them. I told her to ask family to bring something in as long as not high in salt  Objective: Vital signs in last 24 hours: Temp:  [97.6 F (36.4 C)-98.3 F (36.8 C)] 97.6 F (36.4 C) (11/07 0312) Pulse Rate:  [72-102] 85 (11/07 0312) Cardiac Rhythm: Sinus tachycardia;Bundle branch block (11/06 1900) Resp:  [13-23] 13 (11/07 0312) BP: (105-130)/(41-59) 111/52 (11/07 0312) SpO2:  [94 %-100 %] 100 % (11/07 0312)      Intake/Output from previous day: 11/06 0701 - 11/07 0700 In: 25 [P.O.:480] Out: 802 [Urine:800; Chest Tube:2]   Physical Exam:  Cardiovascular: RRR Pulmonary: Slightly diminished breath sounds bilaterally Abdomen: Soft, non tender, bowel sounds present. Extremities: No lower extremity edema. Wounds: Clean and dry.  No erythema or signs of infection. Chest Tube:to water seal, very small,very intermittent air leak with cough  Lab Results: CBC:No results for input(s): WBC, HGB, HCT, PLT in the last 72 hours. BMET:  Recent Labs    11/18/20 0039  NA 134*  K 4.0  CL 100  CO2 25  GLUCOSE 115*  BUN 16  CREATININE 0.68  CALCIUM 8.3*    PT/INR: No results for input(s): LABPROT, INR in the last 72 hours. ABG:  INR: Will add last result for INR, ABG once components are confirmed Will add last 4 CBG results once components are confirmed  Assessment/Plan:  1. CV - ST at times. History of CAD, previous CABG x 4. On Lopressor 25 mg bid, Entresto 24/26 bid, Plavix 75 mg daily, and ec asa 81 mg daily. 2.  Pulmonary - On room air. Chest tube with scant output last 24 hours. Chest tube is to water seal. Very small, very intermittent air leak with  cough (air leak not present with every cough, intermittent only). CXR this am appears stable (left base atelectasis, stable subcutaneous emphysema left lateral chest wall, no left basilar pneumothorax seen). Final pathology:  Invasive moderately differentiated squamous cell carcinoma, pT3, pN1 (not discussed with patient). Hope to remove chest tube soon. Encourage incentive spirometer 3. Expected post op blood loss anemia-Last H and H stable at 10.6 and 32.1 4. Mild thrombocytopenia-last platelets 125,000  Ikenna Ohms M ZimmermanPA-C 11/20/2020,7:07 AM [phone]

## 2020-11-21 ENCOUNTER — Inpatient Hospital Stay (HOSPITAL_COMMUNITY): Payer: Medicare Other

## 2020-11-21 NOTE — Plan of Care (Signed)
  Problem: Education: Goal: Knowledge of General Education information will improve Description: Including pain rating scale, medication(s)/side effects and non-pharmacologic comfort measures Outcome: Progressing   Problem: Health Behavior/Discharge Planning: Goal: Ability to manage health-related needs will improve Outcome: Progressing   Problem: Clinical Measurements: Goal: Ability to maintain clinical measurements within normal limits will improve Outcome: Progressing Goal: Will remain free from infection Outcome: Progressing Goal: Diagnostic test results will improve Outcome: Progressing Goal: Respiratory complications will improve Outcome: Progressing   Problem: Activity: Goal: Risk for activity intolerance will decrease Outcome: Progressing   Problem: Pain Managment: Goal: General experience of comfort will improve Outcome: Progressing   Problem: Skin Integrity: Goal: Risk for impaired skin integrity will decrease Outcome: Progressing

## 2020-11-21 NOTE — Progress Notes (Signed)
Mobility Specialist Progress Note    11/21/20 0945  Mobility  Activity Ambulated in hall  Level of Assistance Modified independent, requires aide device or extra time  Assistive Device Front wheel Sahni  Distance Ambulated (ft) 450 ft  Mobility Ambulated with assistance in hallway  Mobility Response Tolerated well  Mobility performed by Mobility specialist  $Mobility charge 1 Mobility   Pre-Mobility: 84 HR, BP, 93% SpO2 Post-Mobility: 109 HR, 159/54 BP, 95% SpO2  Pt received in bed and agreeable. No complaints on walk. Returned to bed with call bell in reach.   [name] Mobility Specialist  Mobility Specialist Phone: [phone]

## 2020-11-21 NOTE — Progress Notes (Addendum)
      [address]       [place] [address]             [phone]       8 Days Post-Op Procedure(s) (LRB): XI ROBOTIC ASSISTED THORASCOPY-LEFT LOWER LOBECTOMY (Left) INTERCOSTAL NERVE BLOCK (Left) LYMPH NODE DISSECTION (Left)  Subjective: Having some mild soreness but this is improving.  No new concerns.  No further muscle spasms.  Objective: Vital signs in last 24 hours: Temp:  [97.5 F (36.4 C)-98.1 F (36.7 C)] 98 F (36.7 C) (11/08 0307) Pulse Rate:  [79-108] 79 (11/07 2359) Cardiac Rhythm: Normal sinus rhythm;Bundle branch block (11/07 1919) Resp:  [15-24] 19 (11/07 2359) BP: (112-133)/(41-89) 115/51 (11/08 0307) SpO2:  [97 %-99 %] 99 % (11/07 2359)     Intake/Output from previous day: 11/07 0701 - 11/08 0700 In: 44 [P.O.:490] Out: 80 [Chest Tube:80]   Physical Exam:  Cardiovascular: RRR Pulmonary: Breath sounds are clear.  O2 sat stable on room air. Extremities: No lower extremity edema. Wounds: Clean and dry.  No erythema or signs of infection. Chest Tube:to water seal.  No air leak,  today's x-ray is showing both lungs well expanded.  Lab Results: CBC:No results for input(s): WBC, HGB, HCT, PLT in the last 72 hours. BMET:  No results for input(s): NA, K, CL, CO2, GLUCOSE, BUN, CREATININE, CALCIUM in the last 72 hours.   PT/INR: No results for input(s): LABPROT, INR in the last 72 hours. ABG:  INR: Will add last result for INR, ABG once components are confirmed Will add last 4 CBG results once components are confirmed  Assessment/Plan:  1. CV - ST at times. History of CAD, previous CABG x 4. On Lopressor 25 mg bid, Entresto 24/26 bid, Plavix 75 mg daily, and ec asa 81 mg daily. 2.  Pulmonary - On room air. Chest tube with scant output last 24 hours. Chest tube is to water seal.  No airleak, chest x-ray stable.  Expect we can remove the chest tube today.    Final pathology:  Invasive moderately differentiated squamous cell  carcinoma, pT3, pN1   3. Expected post op blood loss anemia-Last H and H stable at 10.6 and 32.1 4. Mild thrombocytopenia-resolved  Disposition: Plan for chest tube removal today if Dr. Roxan Hockey agrees.  Anticipate discharge tomorrow morning if chest x-ray remained stable.  Joline Maxcy 11/21/2020,7:26 AM [phone]   No air leak with repeated coughing CXR OK Dc chest tube Possibly home tomorrow  Remo Lipps C. Roxan Hockey, MD [facility] [phone]

## 2020-11-22 ENCOUNTER — Inpatient Hospital Stay (HOSPITAL_COMMUNITY): Payer: Medicare Other

## 2020-11-22 MED ORDER — TRAMADOL HCL 50 MG PO TABS: 50.0000 mg | ORAL_TABLET | Freq: Four times a day (QID) | ORAL | 0 refills | Status: AC | PRN

## 2020-11-22 NOTE — Progress Notes (Signed)
9 Days Post-Op Procedure(s) (LRB): XI ROBOTIC ASSISTED THORASCOPY-LEFT LOWER LOBECTOMY (Left) INTERCOSTAL NERVE BLOCK (Left) LYMPH NODE DISSECTION (Left) Subjective: No complaints, wants to go home  Objective: Vital signs in last 24 hours: Temp:  [97.5 F (36.4 C)-98.3 F (36.8 C)] 98 F (36.7 C) (11/09 0740) Pulse Rate:  [77-90] 84 (11/09 0740) Cardiac Rhythm: Normal sinus rhythm;Bundle branch block (11/09 0705) Resp:  [16-22] 22 (11/09 0740) BP: (106-127)/(38-55) 106/55 (11/09 0740) SpO2:  [95 %-100 %] 98 % (11/09 0740)  Hemodynamic parameters for last 24 hours:    Intake/Output from previous day: 11/08 0701 - 11/09 0700 In: -  Out: 700 [Urine:600; Chest Tube:100] Intake/Output this shift: Total I/O In: 240 [P.O.:240] Out: -   General appearance: alert, cooperative, and no distress Neurologic: intact Heart: regular rate and rhythm Lungs: diminished breath sounds left base Wound: clean and dry  Lab Results: No results for input(s): WBC, HGB, HCT, PLT in the last 72 hours. BMET: No results for input(s): NA, K, CL, CO2, GLUCOSE, BUN, CREATININE, CALCIUM in the last 72 hours.  PT/INR: No results for input(s): LABPROT, INR in the last 72 hours. ABG    Component Value Date/Time   PHART 7.388 11/09/2020 1116   HCO3 25.2 11/09/2020 1116   TCO2 23 05/03/2020 2243   ACIDBASEDEF 1.6 07/24/2020 2245   O2SAT 97.1 11/09/2020 1116   CBG (last 3)  No results for input(s): GLUCAP in the last 72 hours.  Assessment/Plan: S/P Procedure(s) (LRB): XI ROBOTIC ASSISTED THORASCOPY-LEFT LOWER LOBECTOMY (Left) INTERCOSTAL NERVE BLOCK (Left) LYMPH NODE DISSECTION (Left) Plan for discharge: see discharge orders Doing well CXR OK Path T3N1, stage IIIA - will arrange outpatient Oncology consult for adjuvant therapy Home today   LOS: 9 days    Melrose Nakayama 11/22/2020

## 2020-11-22 NOTE — Progress Notes (Signed)
Pt discharging home with family.  All instructions given and reviewed, all questions answered

## 2020-11-22 NOTE — Progress Notes (Signed)
Mobility Specialist Progress Note    11/22/20 0946  Mobility  Activity Ambulated in hall  Level of Assistance Independent after set-up  Assistive Device None  Distance Ambulated (ft) 285 ft  Mobility Ambulated independently in hallway  Mobility Response Tolerated well  Mobility performed by Mobility specialist  $Mobility charge 1 Mobility   During Mobility: 115 HR Post-Mobility: 102 HR  Pt received in bed and agreeable. C/o feeling a little weak. Returned to room with call bell in reach and RN present.   Hildred Alamin Mobility Specialist  Mobility Specialist Phone: [phone]

## 2020-11-23 ENCOUNTER — Encounter (INDEPENDENT_AMBULATORY_CARE_PROVIDER_SITE_OTHER): Payer: Medicare Other

## 2020-11-23 ENCOUNTER — Ambulatory Visit (INDEPENDENT_AMBULATORY_CARE_PROVIDER_SITE_OTHER): Payer: Medicare Other | Admitting: Vascular Surgery

## 2020-11-23 ENCOUNTER — Other Ambulatory Visit: Payer: Self-pay | Admitting: *Deleted

## 2020-11-23 LAB — SURGICAL PATHOLOGY

## 2020-11-23 NOTE — Progress Notes (Signed)
The proposed treatment discussed in conference is for discussion purpose only and is not a binding recommendation. The patient was not been physically examined, or presented with their treatment options. Therefore, final treatment plans cannot be decided.  

## 2020-11-29 ENCOUNTER — Other Ambulatory Visit: Payer: Self-pay

## 2020-11-29 ENCOUNTER — Encounter (INDEPENDENT_AMBULATORY_CARE_PROVIDER_SITE_OTHER): Payer: Self-pay

## 2020-11-29 DIAGNOSIS — Z4802 Encounter for removal of sutures: Secondary | ICD-10-CM

## 2020-12-06 ENCOUNTER — Other Ambulatory Visit (INDEPENDENT_AMBULATORY_CARE_PROVIDER_SITE_OTHER): Payer: Self-pay | Admitting: Vascular Surgery

## 2020-12-06 DIAGNOSIS — I70209 Unspecified atherosclerosis of native arteries of extremities, unspecified extremity: Secondary | ICD-10-CM

## 2020-12-11 ENCOUNTER — Other Ambulatory Visit: Payer: Self-pay

## 2020-12-11 ENCOUNTER — Ambulatory Visit (INDEPENDENT_AMBULATORY_CARE_PROVIDER_SITE_OTHER): Payer: Medicare Other | Admitting: Vascular Surgery

## 2020-12-11 ENCOUNTER — Other Ambulatory Visit: Payer: Self-pay | Admitting: Thoracic Surgery (Cardiothoracic Vascular Surgery)

## 2020-12-11 ENCOUNTER — Encounter (INDEPENDENT_AMBULATORY_CARE_PROVIDER_SITE_OTHER): Payer: Self-pay | Admitting: Vascular Surgery

## 2020-12-11 ENCOUNTER — Ambulatory Visit (INDEPENDENT_AMBULATORY_CARE_PROVIDER_SITE_OTHER): Payer: Medicare Other

## 2020-12-11 VITALS — BP 121/75 | HR 72 | Resp 16 | Wt 109.0 lb

## 2020-12-11 DIAGNOSIS — I25118 Atherosclerotic heart disease of native coronary artery with other forms of angina pectoris: Secondary | ICD-10-CM | POA: Diagnosis not present

## 2020-12-11 DIAGNOSIS — I739 Peripheral vascular disease, unspecified: Secondary | ICD-10-CM | POA: Diagnosis not present

## 2020-12-11 DIAGNOSIS — I70209 Unspecified atherosclerosis of native arteries of extremities, unspecified extremity: Secondary | ICD-10-CM

## 2020-12-11 DIAGNOSIS — I200 Unstable angina: Secondary | ICD-10-CM

## 2020-12-11 DIAGNOSIS — I6523 Occlusion and stenosis of bilateral carotid arteries: Secondary | ICD-10-CM

## 2020-12-11 DIAGNOSIS — Z951 Presence of aortocoronary bypass graft: Secondary | ICD-10-CM

## 2020-12-11 DIAGNOSIS — I10 Essential (primary) hypertension: Secondary | ICD-10-CM

## 2020-12-11 DIAGNOSIS — J984 Other disorders of lung: Secondary | ICD-10-CM

## 2020-12-11 DIAGNOSIS — E782 Mixed hyperlipidemia: Secondary | ICD-10-CM

## 2020-12-11 NOTE — Progress Notes (Signed)
MRN : [id number]  [name] is a 80 y.o. (1941-01-12) female who presents with chief complaint of check legs.  History of Present Illness:   The patient returns to the office for followup and review of the noninvasive studies. There have been no interval changes in lower extremity symptoms. No interval shortening of the patient's claudication distance or development of rest pain symptoms. No new ulcers or wounds have occurred since the last visit.   There have been 2 significant changes to the patient's overall health care.  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  More recently she has been diagnosed with left lung carcinoma   The patient denies amaurosis fugax or recent TIA symptoms. There are no recent neurological changes noted. The patient denies history of DVT, PE or superficial thrombophlebitis. The patient denies recent episodes of angina or shortness of breath.   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   Previous duplex ultrasound of the right lower extremity shows moderate disease in the common femoral artery and the proximal SFA and moderate left common femoral and deep femoral stenosis, no other hemodynamically significant stenosis identified more distally   ABI's Rt=0.88 and Lt=0.75 (previous ABI's Rt=1.11 and Lt=1.17)  No outpatient medications have been marked as taking for the 12/11/20 encounter (Appointment) with Delana Meyer, Dolores Lory, MD.    Past Medical History:  Diagnosis Date   Arthritis    Breast cancer [facility]) 2011   radiation- Left   CHF (congestive heart failure) ([place])    Coronary artery disease    Dyspnea    Hyperlipidemia    Hypertension    Myocardial infarction [facility])    Personal history of radiation therapy    PVD (peripheral vascular disease) ([place])     Past Surgical History:  Procedure Laterality Date   ABDOMINAL HYSTERECTOMY      APPENDECTOMY     BREAST BIOPSY Left 12/21/2009   positive, radiation dcis   BREAST EXCISIONAL BIOPSY Left 01/17/2010   lumpectomy DCIS   BREAST LUMPECTOMY     CATARACT EXTRACTION W/ INTRAOCULAR LENS IMPLANT & ANTERIOR VITRECTOMY, BILATERAL     CHOLECYSTECTOMY     COLONOSCOPY WITH PROPOFOL N/A 09/25/2017   Procedure: COLONOSCOPY WITH PROPOFOL;  Surgeon: Lollie Sails, MD;  Location: [facility] ENDOSCOPY;  Service: Endoscopy;  Laterality: N/A;   CORONARY ARTERY BYPASS GRAFT N/A 05/03/2020   Procedure: CORONARY ARTERY BYPASS GRAFTING (CABG) X FOUR ON PUMP USING LEFT INTERNAL MAMMARY ARTERY AND RIGHT ENDOSCOPIC SAPHEOUS VEIN HARVEST CONDUITS;  Surgeon: Melrose Nakayama, MD;  Location: [place];  Service: Open Heart Surgery;  Laterality: N/A;   DILATION AND CURETTAGE OF UTERUS     EYE SURGERY     bilateral cataracts   INTERCOSTAL NERVE BLOCK Left 11/13/2020   Procedure: INTERCOSTAL NERVE BLOCK;  Surgeon: Melrose Nakayama, MD;  Location: [place];  Service: Thoracic;  Laterality: Left;   JOINT REPLACEMENT Right 01/24/2016   KNEE ARTHROPLASTY Right 01/24/2016   Procedure: COMPUTER ASSISTED TOTAL KNEE ARTHROPLASTY;  Surgeon: Dereck Leep, MD;  Location: [facility] ORS;  Service: Orthopedics;  Laterality: Right;   LEFT HEART CATH AND CORONARY ANGIOGRAPHY N/A 05/01/2020   Procedure: LEFT HEART CATH AND CORONARY ANGIOGRAPHY;  Surgeon: Wellington Hampshire, MD;  Location: [place] CV LAB;  Service: Cardiovascular;  Laterality: N/A;   LYMPH NODE DISSECTION Left 11/13/2020   Procedure: LYMPH NODE DISSECTION;  Surgeon:  Melrose Nakayama, MD;  Location: [facility] OR;  Service: Thoracic;  Laterality: Left;   RIGHT/LEFT HEART CATH AND CORONARY/GRAFT ANGIOGRAPHY N/A 07/27/2020   Procedure: RIGHT/LEFT HEART CATH AND CORONARY/GRAFT ANGIOGRAPHY;  Surgeon: Wellington Hampshire, MD;  Location: [place] CV LAB;  Service: Cardiovascular;  Laterality: N/A;   stent in Lt leg Left    TEE WITHOUT CARDIOVERSION N/A  05/03/2020   Procedure: TRANSESOPHAGEAL ECHOCARDIOGRAM (TEE);  Surgeon: Melrose Nakayama, MD;  Location: [place];  Service: Open Heart Surgery;  Laterality: N/A;   VIDEO BRONCHOSCOPY WITH ENDOBRONCHIAL NAVIGATION N/A 09/25/2020   Procedure: VIDEO BRONCHOSCOPY WITH ENDOBRONCHIAL NAVIGATION;  Surgeon: Melrose Nakayama, MD;  Location: [facility] OR;  Service: Thoracic;  Laterality: N/A;    Social History Social History   Tobacco Use   Smoking status: Former   Smokeless tobacco: Never  Scientific laboratory technician Use: Never used  Substance Use Topics   Alcohol use: No    Alcohol/week: 0.0 standard drinks   Drug use: No    Family History Family History  Problem Relation Age of Onset   Hypertension Mother    Arthritis Mother    Gout Mother    Lung cancer Father    Heart attack Sister     Allergies  Allergen Reactions   Ibuprofen Hives   Penicillins Hives    Tolerated Ancef in the past   Aleve [Naproxen Sodium] Hives   Avapro  [Irbesartan] Rash   Azithromycin Hives   Nickel Itching    Redness, itching and fluid discharge with nickel earrings     REVIEW OF SYSTEMS (Negative unless checked)  Constitutional: [] Weight loss  [] Fever  [] Chills Cardiac: [] Chest pain   [] Chest pressure   [] Palpitations   [] Shortness of breath when laying flat   [] Shortness of breath with exertion. 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 Genitourinary:  [] Chronic kidney disease   [] Difficult urination  [] Frequent urination   [] Blood in urine Skin:  [] Rashes   [] Ulcers  Psychological:  [] History of anxiety   []  History of major depression.  Physical Examination  There were no vitals filed for this visit. There is no height or weight on file to calculate BMI. Gen: WD/WN, NAD Head: [place]/AT, No temporalis wasting.  Ear/Nose/Throat: Hearing grossly intact, nares w/o erythema or drainage Eyes: PER, EOMI, sclera nonicteric.  Neck: Supple, no masses.  No bruit or JVD.  Pulmonary:  Good air movement, no audible wheezing, no use of accessory muscles.  Cardiac: RRR, normal S1, S2, no Murmurs. Vascular:   Vessel Right Left  Radial Palpable Palpable  Carotid Palpable Palpable  PT Not palpable Not palpable  DP Not palpable Not palpable  Gastrointestinal: soft, non-distended. No guarding/no peritoneal signs.  Musculoskeletal: M/S 5/5 throughout.  No  visible deformity.  Neurologic: CN 2-12 intact. Pain and light touch intact in extremities.  Symmetrical.  Speech is fluent. Motor exam as listed above. Psychiatric: Judgment intact, Mood & affect appropriate for pt's clinical situation. Dermatologic: No rashes or ulcers noted.  No changes consistent with cellulitis.   CBC Lab Results  Component Value Date   WBC 10.0 11/15/2020   HGB 10.6 (L) 11/15/2020   HCT 32.1 (L) 11/15/2020   MCV 100.9 (H) 11/15/2020   PLT 125 (L) 11/15/2020    BMET    Component Value Date/Time   NA 134 (L) 11/18/2020 0039   K 4.0 11/18/2020 0039   CL 100 11/18/2020 0039   CO2 25 11/18/2020 0039   GLUCOSE 115 (H) 11/18/2020 0039   BUN 16 11/18/2020 0039   CREATININE 0.68 11/18/2020 0039   CALCIUM 8.3 (L) 11/18/2020 0039   GFRNONAA >60 11/18/2020 0039   GFRAA >60 01/26/2016 0418   CrCl cannot be calculated (Patient's most recent lab result is older than the maximum 21 days  allowed.).  COAG Lab Results  Component Value Date   INR 1.1 11/09/2020   INR 1.1 11/05/2020   INR 1.1 09/21/2020    Radiology DG Chest 2 View  Result Date: 11/22/2020 CLINICAL DATA:  Recent partial resection of left lung left pneumothorax EXAM: CHEST - 2 VIEW COMPARISON:  Previous studies including the examination of 11/21/2020 FINDINGS: Transverse diameter of heart is slightly increased. There are no signs of pulmonary edema. Increased density in the left lower lung fields may suggest atelectasis. There is interval appearance of small hazy density in the medial left upper lung fields. There is blunting of left lateral CP angle. There is interval resolution of minimal left apical pneumothorax. There is decrease in subcutaneous emphysema in the left chest wall. IMPRESSION: Infiltrates in the left lower lung fields suggest atelectasis/pneumonitis. There is new small focal infiltrate in the medial left upper lung fields. Small left pleural effusion. There is no pneumothorax. Electronically Signed   By: Elmer Picker M.D.   On: 11/22/2020 09:14   DG Chest 2 View  Result Date: 11/21/2020 CLINICAL DATA:  Partial resection of left lung left pneumothorax EXAM: CHEST - 2 VIEW COMPARISON:  Previous studies including the examination of 11/20/2020 FINDINGS: There is interval resolution of minimal left apical pneumothorax. Tip left chest tube is noted overlying the left apex. Increased markings in the left lower lung fields have not changed significantly. There are no new focal pulmonary infiltrates. There is blunting of left lateral CP angle. Right lateral CP angle is clear. There is interval decrease in subcutaneous emphysema in the left chest wall. IMPRESSION: Interval resolution of minimal left apical pneumothorax. Increased markings in the left lower lung fields have not changed significantly suggesting subsegmental atelectasis/pneumonitis. Possible small left pleural effusion. Electronically Signed    By: Elmer Picker M.D.   On: 11/21/2020 08:20   DG Chest Port 1 View  Result Date: 11/21/2020 CLINICAL DATA:  Chest tube removal. EXAM: PORTABLE CHEST 1 VIEW COMPARISON:  11/21/2020 FINDINGS: 0854 hours. Cardiopericardial silhouette is at upper limits of normal for size. Similar left base collapse/consolidation with tiny effusion. Right lung clear. Left chest tube is been removed in the interval with probable tiny residual left pneumothorax. Subcutaneous emphysema again noted left chest wall. IMPRESSION: Interval removal of left chest tube with probable tiny residual left pneumothorax. Otherwise no substantial change. Electronically Signed   By: Misty Stanley M.D.   On: 11/21/2020 10:54   DG Chest  Port 1 View  Result Date: 11/20/2020 CLINICAL DATA:  Status post partial resection of left lung EXAM: PORTABLE CHEST 1 VIEW COMPARISON:  Previous studies including the examination of 11/19/2020 FINDINGS: Tip of left chest tube is seen in the left apex. There is interval almost complete resolution of left pneumothorax. There is possible tiny left pneumothorax along the lateral aspect of the left apex. There is decreased volume in the left lung with elevation of left hemidiaphragm. There are patchy densities in the left lower lung field obscuring the left hemidiaphragm. Small left pleural effusion is seen. There is subcutaneous emphysema in the left chest wall with interval increase. Right lung is clear. There are no signs of pulmonary edema. There is evidence of previous coronary bypass surgery. IMPRESSION: There is almost complete clearing of left pneumothorax. There is minimal left apical pneumothorax. Increased density in the left lower lung fields may suggest pleural effusion and possibly atelectasis. Electronically Signed   By: Elmer Picker M.D.   On: 11/20/2020 08:15   DG Chest Port 1 View  Result Date: 11/19/2020 CLINICAL DATA:  Status post lobectomy EXAM: PORTABLE CHEST 1 VIEW COMPARISON:   Chest radiograph from one day prior. FINDINGS: Stable left apical chest tube. Intact sternotomy wires. Surgical clips overlie the left chest. Similar subcutaneous emphysema in the lateral left chest wall. Stable cardiomediastinal silhouette with top-normal heart size. Small to moderate left pneumothorax, stable. No right pneumothorax. No pleural effusion. Stable mild volume loss in the left hemithorax. Mild left basilar atelectasis, improved. Clear right lung. IMPRESSION: 1. Stable small to moderate left pneumothorax. Stable left apical chest tube. 2. Improved mild left basilar atelectasis. Electronically Signed   By: Ilona Sorrel M.D.   On: 11/19/2020 07:19   DG CHEST PORT 1 VIEW  Result Date: 11/18/2020 CLINICAL DATA:  Chest pain and shortness of breath. Left pneumothorax. EXAM: PORTABLE CHEST 1 VIEW COMPARISON:  11/17/2020 FINDINGS: Left chest tube remains in place. A small left pneumothorax with both basilar and apical components is unchanged in size. Opacity in the left lung base is unchanged, and may be due to atelectasis or infiltrate. Right lung remains clear. Prior CABG again noted. IMPRESSION: Stable small left pneumothorax. Stable left basilar atelectasis versus infiltrate. Electronically Signed   By: Marlaine Hind M.D.   On: 11/18/2020 11:11   DG CHEST PORT 1 VIEW  Result Date: 11/17/2020 CLINICAL DATA:  Chest tube EXAM: PORTABLE CHEST 1 VIEW COMPARISON:  Chest x-ray 11/16/2020 FINDINGS: Left-sided chest tube in stable position with the tip near the apex. Interval expansion of the left lung with a small pneumothorax now visualized laterally, improved since previous study. Persistent irregular opacities in the left lower lung zone likely represent atelectatic changes. Right lung appears clear. Cardiomediastinal silhouette is unchanged. This cardiac surgical changes and median sternotomy wires. Small amount of subcutaneous emphysema in the left chest wall. IMPRESSION: Decreased size of a now small  left pneumothorax. Chest tube in stable position. Electronically Signed   By: Ofilia Neas M.D.   On: 11/17/2020 08:32   DG CHEST PORT 1 VIEW  Result Date: 11/16/2020 CLINICAL DATA:  Follow-up lobectomy EXAM: PORTABLE CHEST 1 VIEW COMPARISON:  11/15/2020 FINDINGS: Left chest tube remains in place. The pneumothorax is larger, about 20%. Right chest remains clear. Central line is unchanged. IMPRESSION: Enlargement of the left pneumothorax, about 20% presently. Electronically Signed   By: Nelson Chimes M.D.   On: 11/16/2020 08:54   DG Chest Port 1 View  Result Date: 11/15/2020  CLINICAL DATA:  Status post left lobectomy.  Follow-up study. EXAM: PORTABLE CHEST 1 VIEW COMPARISON:  11/14/2020 and older exams. FINDINGS: Left chest tube is stable, tip at the apex. Minimal left pneumothorax. Persistent left lung base opacity consistent with atelectasis. Postsurgical changes with pulmonary anastomosis staples and multiple surgical vascular clips on the left are also unchanged. Right lung is clear. No mediastinal widening. Stable cardiac silhouette and changes from prior CABG surgery. Left subclavian central venous line is stable. Small amount of subcutaneous air at the left neck base is stable. IMPRESSION: 1. Minimal left pneumothorax. This probably present on the previous day's exam, with better visualized currently and not felt to represent a change. 2. Mild persistent left lung base opacity consistent with atelectasis. No new lung abnormalities. 3. Stable support apparatus. Electronically Signed   By: Lajean Manes M.D.   On: 11/15/2020 08:36   DG Chest Port 1 View  Result Date: 11/14/2020 CLINICAL DATA:  Chest x-ray 11/13/2020 EXAM: PORTABLE CHEST 1 VIEW COMPARISON:  Chest x-ray 11/13/2020 FINDINGS: Left subclavian line stable with the tip in the SVC. Left-sided chest tube stable with the tip near the apex. Small amount of left-sided subcutaneous emphysema. Heart is enlarged. Mediastinum appears stable.  Calcified plaques in the aortic arch. Cardiac surgical changes and median sternotomy wires. Pulmonary vasculature is within normal limits. Minor likely subsegmental atelectasis at the left lung base. No pleural effusion or pneumothorax visualized. IMPRESSION: 1. Medical devices as described. 2. Minor likely subsegmental atelectasis at the left lung base. 3. Cardiomegaly. Electronically Signed   By: Ofilia Neas M.D.   On: 11/14/2020 09:03   DG Chest Port 1 View  Result Date: 11/13/2020 CLINICAL DATA:  S/P lobectomy of lung EXAM: PORTABLE CHEST 1 VIEW COMPARISON:  Chest x-ray 11/09/2020, CT chest 11/05/2020 FINDINGS: Left chest tube noted. Left subclavian central venous catheter with tip overlying the expected region of the superior vena cava. The heart and mediastinal contours unchanged. Status post left lower lobectomy. No focal consolidation. No pulmonary edema. No pleural effusion. No pneumothorax. No acute osseous abnormality. IMPRESSION: Status post left lower lobectomy. Electronically Signed   By: Iven Finn M.D.   On: 11/13/2020 18:47     Assessment/Plan 1. PAD (peripheral artery disease) (HCC)  Recommend:  The patient has evidence of atherosclerosis of the lower extremities with claudication.  The patient does not voice lifestyle limiting changes at this point in time.  Although her ABIs do seem to have decreased she adamantly denies any worsening of her claudication symptoms at this time.  No invasive studies, angiography or surgery at this time The patient should continue walking and begin a more formal exercise program.  The patient should continue antiplatelet therapy and aggressive treatment of the lipid abnormalities  No changes in the patient's medications at this time  - VAS [place] ABI WITH/WO TBI; Future  2. Bilateral carotid artery stenosis Recommend:   Given the patient's asymptomatic subcritical stenosis no further invasive testing or surgery at this time.    Duplex ultrasound shows <40% stenosis bilaterally.   Continue antiplatelet therapy as prescribed Continue management of CAD, HTN and Hyperlipidemia Healthy heart diet,  encouraged exercise at least 4 times per week Follow up in 12 months with duplex ultrasound and physical exam   3. Essential hypertension Continue antihypertensive medications as already ordered, these medications have been reviewed and there are no changes at this time.   4. Coronary artery disease of native artery of native heart with stable angina pectoris (  [place]) Continue cardiac and antihypertensive medications as already ordered and reviewed, no changes at this time.  Continue statin as ordered and reviewed, no changes at this time  Nitrates PRN for chest pain   5. Mixed hyperlipidemia Continue statin as ordered and reviewed, no changes at this time     Hortencia Pilar, MD  12/11/2020 12:57 PM

## 2020-12-12 ENCOUNTER — Ambulatory Visit
Admission: RE | Admit: 2020-12-12 | Discharge: 2020-12-12 | Disposition: A | Discharge status: Home / Self Care | Payer: Medicare Other | Source: Ambulatory Visit | Attending: Thoracic Surgery (Cardiothoracic Vascular Surgery) | Admitting: Thoracic Surgery (Cardiothoracic Vascular Surgery)

## 2020-12-12 ENCOUNTER — Ambulatory Visit (INDEPENDENT_AMBULATORY_CARE_PROVIDER_SITE_OTHER): Payer: Self-pay | Admitting: Thoracic Surgery (Cardiothoracic Vascular Surgery)

## 2020-12-12 ENCOUNTER — Encounter (INDEPENDENT_AMBULATORY_CARE_PROVIDER_SITE_OTHER): Payer: Self-pay | Admitting: Vascular Surgery

## 2020-12-12 VITALS — BP 140/68 | HR 110 | Resp 20 | Ht 60.0 in | Wt 110.0 lb

## 2020-12-12 DIAGNOSIS — Z09 Encounter for follow-up examination after completed treatment for conditions other than malignant neoplasm: Secondary | ICD-10-CM

## 2020-12-12 DIAGNOSIS — J984 Other disorders of lung: Secondary | ICD-10-CM

## 2020-12-12 MED ORDER — OXYCODONE HCL 5 MG PO TABS: 5.0000 mg | ORAL_TABLET | Freq: Three times a day (TID) | ORAL | 0 refills | Status: DC | PRN

## 2020-12-12 MED ORDER — GABAPENTIN 100 MG PO CAPS: 100.0000 mg | ORAL_CAPSULE | Freq: Three times a day (TID) | ORAL | 3 refills | Status: DC

## 2020-12-12 NOTE — Progress Notes (Signed)
[address]       [place] [address]             [phone]     HPI: Mrs. Gillham returns for a scheduled follow-up visit after recent extrapleural left lower lobectomy.    Eneida Evers is a 80 year old woman with a history of hypertension, hyperlipidemia, left main disease, CABG x4, thoracic aortic atherosclerosis, DCIS of the breast, and remote tobacco abuse.  She had coronary bypass grafting back in April.  There was a left lower lobe lung mass noted on her preoperative imaging.  On a follow-up chest x-ray in May it appeared that it might be a little smaller.  A repeat CT showed the mass was really the same size it had been previously and on PET/CT it was markedly hypermetabolic.  Navigational bronchoscopy showed squamous cell carcinoma.  It was hard for me to get her to go through the work-up but she was still recovering from her bypass surgery and was not feeling well.  She ultimately agreed to have surgery and I did a robotic assisted extrapleural left lower lobectomy on 11/13/2020.  Postoperatively she had an air leak initially.  She had a good deal of pain.  The final pathology was T3, N2, stage IIIb.  She had occult N2 disease.  She complains of feeling tired and getting out of breath with exertion and also of pain.  She says it is a sharp stabbing brief pain.  She is only taking Tylenol for that.  It has gotten a little better over the past week or so.  She tried tramadol but that was ineffective.  She does have difficulty sleeping due to the pain.  Past Medical History:  Diagnosis Date   Arthritis    Breast cancer ([place]) 2011   radiation- Left   CHF (congestive heart failure) (HCC)    Coronary artery disease    Dyspnea    Hyperlipidemia    Hypertension    Myocardial infarction [facility])    Personal history of radiation therapy    PVD (peripheral vascular disease) (HCC)      Current Outpatient Medications  Medication Sig Dispense Refill    acetaminophen (TYLENOL) 500 MG tablet Take 1,000 mg by mouth every 6 (six) hours as needed for moderate pain or headache.     aspirin EC 81 MG tablet Take 81 mg by mouth daily.     atorvastatin (LIPITOR) 20 MG tablet Take 1 tablet (20 mg total) by mouth at bedtime. 90 tablet 3   Calcium Carbonate-Vit D-Min (CALCIUM 600+D3 PLUS MINERALS) 600-800 MG-UNIT TABS Take 1 tablet by mouth daily.     chlorpheniramine-HYDROcodone (TUSSIONEX) 10-8 MG/5ML SUER Take 5 mLs by mouth every 12 (twelve) hours as needed for cough.     clopidogrel (PLAVIX) 75 MG tablet Take 1 tablet (75 mg total) by mouth daily. 90 tablet 3   diphenhydrAMINE-zinc acetate (BENADRYL) cream Apply 1 application topically daily as needed for itching.     gabapentin (NEURONTIN) 100 MG capsule Take 1 capsule (100 mg total) by mouth 3 (three) times daily. 90 capsule 3   Multiple Vitamin (MULTIVITAMIN WITH MINERALS) TABS tablet Take 1 tablet by mouth daily.     nitroGLYCERIN (NITROSTAT) 0.4 MG SL tablet Place 1 tablet (0.4 mg total) under the tongue every 5 (five) minutes as needed for up to 10 days for chest pain. 10 tablet 0   oxyCODONE (OXY IR/ROXICODONE) 5 MG immediate release tablet Take 1 tablet (  5 mg total) by mouth 3 (three) times daily as needed for severe pain. 15 tablet 0   furosemide (LASIX) 40 MG tablet Take 1 tablet (40 mg total) by mouth daily. (Patient not taking: Reported on 12/12/2020) 30 tablet 0   metoprolol succinate (TOPROL-XL) 50 MG 24 hr tablet Take 1 tablet (50 mg total) by mouth daily. Take with or immediately following a meal. (Patient not taking: Reported on 12/12/2020) 30 tablet 0   potassium chloride SA (KLOR-CON) 10 MEQ tablet Take 1 tablet (10 mEq total) by mouth daily. (Patient not taking: Reported on 12/12/2020) 30 tablet 0   sacubitril-valsartan (ENTRESTO) 24-26 MG Take 1 tablet by mouth 2 (two) times daily. (Patient not taking: Reported on 12/12/2020) 60 tablet 0   No current facility-administered medications  for this visit.    Physical Exam BP 140/68   Pulse (!) 110   Resp 20   Ht 5' (1.524 m)   Wt 110 lb (49.9 kg)   SpO2 93% Comment: RA  BMI 21.62 kg/m  80 year old woman in no acute distress Alert and oriented x3 with no focal deficits Lungs diminished at left base otherwise clear Incisions clean dry and intact Cardiac regular rate and rhythm  Diagnostic Tests: I personally reviewed her chest x-ray images.  There is postoperative change from the left lower lobectomy.  There is some loculated air and fluid posteriorly.  Impression: Tuyen Uncapher is a 80 year old woman with a history of hypertension, hyperlipidemia, left main disease, CABG x4, thoracic aortic atherosclerosis, DCIS of the breast, and remote tobacco abuse.  She was found to have a lung mass earlier this year around the time she was having coronary bypass grafting for her left main disease.  The mass turned out to be a squamous cell carcinoma.  Clinically it was T3, N0, stage IIb.  Pathologically it was T3N2, stage IIIb.  Status post left lower lobectomy.  She still has some incisional pain.  Tramadol was ineffective.  She is taking Tylenol which helps a little.  She does have times when she needs something stronger than that.  She also has some paresthesias.  We will give her prescription for gabapentin 100 mg p.o. 3 times daily.  Also oxycodone 5 mg p.o. 3 times daily as needed, 15 tablets, no refills.  We discussed how to ramp up the dosing on the gabapentin.  I do not want her to drive yet.  She should avoid activities which cause discomfort.  She will need adjuvant therapy.  I will refer her to oncology at [facility].  Plan: Gabapentin 100 mg p.o. 3 times daily (1 nightly for 3 days, then twice daily for 3 days then 3 times a day) Oxycodone 5 mg p.o. every 8 hours as needed Oncology referral Return in 3 weeks with PA lateral chest x-ray  Melrose Nakayama, MD [facility] [phone]

## 2020-12-12 NOTE — Patient Instructions (Signed)
Gabapentin- 100 mg tablets Take 1 at night for 3 days, then take 1 tablet twice daily for 3 days, then take 1 tablet 3 times daily  Oxycodone- take up to 3 times daily as needed

## 2020-12-15 DIAGNOSIS — C3432 Malignant neoplasm of lower lobe, left bronchus or lung: Secondary | ICD-10-CM | POA: Insufficient documentation

## 2020-12-15 NOTE — Progress Notes (Signed)
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  INTERVAL HISTORY: Patient is a 80 year old female last seen in clinic greater than 5 years ago was noted to have a lesion in her chest last spring at the time of an acute MI.  When she fully recovered, she had CT scan revealing an enlarging lesion and she subsequently underwent lobectomy on November 13, 2020.  She continues to have weakness and fatigue, but otherwise is recovering well.  She has no neurologic complaints.  She denies any recent fevers.  She has a fair appetite, but denies weight loss.  She has no chest pain, shortness of breath, cough, or hemoptysis.  She denies any nausea, vomiting, constipation, or diarrhea.  She has no urinary complaints.  Patient otherwise feels well and offers no further specific complaints today.  REVIEW OF SYSTEMS:   Review of Systems  Constitutional:  Positive for malaise/fatigue. Negative for fever and weight loss.  Respiratory: Negative.  Negative for cough, hemoptysis and shortness of breath.   Cardiovascular: Negative.  Negative for chest pain and leg swelling.  Gastrointestinal: Negative.  Negative for abdominal pain.  Genitourinary: Negative.  Negative for dysuria.  Musculoskeletal: Negative.  Negative for back pain.  Skin: Negative.  Negative for rash.  Neurological:  Positive for weakness. Negative for dizziness, focal weakness and headaches.  Psychiatric/Behavioral: Negative.  The patient is not nervous/anxious.    As per HPI. Otherwise, a complete review of systems is negative.  PAST MEDICAL HISTORY: Past Medical History:  Diagnosis Date   Arthritis    Breast cancer ([place]) 2011   radiation- Left   CHF (congestive heart failure) (HCC)     Coronary artery disease    Dyspnea    Hyperlipidemia    Hypertension    Myocardial infarction [facility])    Personal history of radiation therapy    PVD (peripheral vascular disease) ([place])     PAST SURGICAL HISTORY: Past Surgical History:  Procedure Laterality Date   ABDOMINAL HYSTERECTOMY     APPENDECTOMY     BREAST BIOPSY Left 12/21/2009   positive, radiation dcis   BREAST EXCISIONAL BIOPSY Left 01/17/2010   lumpectomy DCIS   BREAST LUMPECTOMY     CATARACT EXTRACTION W/ INTRAOCULAR LENS IMPLANT & ANTERIOR VITRECTOMY, BILATERAL     CHOLECYSTECTOMY     COLONOSCOPY WITH PROPOFOL N/A 09/25/2017   Procedure: COLONOSCOPY WITH PROPOFOL;  Surgeon: Lollie Sails, MD;  Location: [facility] ENDOSCOPY;  Service: Endoscopy;  Laterality: N/A;   CORONARY ARTERY BYPASS GRAFT N/A 05/03/2020   Procedure: CORONARY ARTERY BYPASS GRAFTING (CABG) X FOUR ON PUMP USING LEFT INTERNAL MAMMARY ARTERY AND RIGHT ENDOSCOPIC SAPHEOUS VEIN HARVEST CONDUITS;  Surgeon: Melrose Nakayama, MD;  Location: [place];  Service: Open Heart Surgery;  Laterality: N/A;   DILATION AND CURETTAGE OF UTERUS     EYE SURGERY     bilateral cataracts   INTERCOSTAL NERVE BLOCK Left 11/13/2020   Procedure: INTERCOSTAL NERVE BLOCK;  Surgeon: Melrose Nakayama, MD;  Location: [place];  Service: Thoracic;  Laterality: Left;   JOINT REPLACEMENT Right 01/24/2016   KNEE ARTHROPLASTY Right 01/24/2016   Procedure: COMPUTER ASSISTED TOTAL KNEE ARTHROPLASTY;  Surgeon: Dereck Leep, MD;  Location: [facility] ORS;  Service: Orthopedics;  Laterality: Right;   LEFT  HEART CATH AND CORONARY ANGIOGRAPHY N/A 05/01/2020   Procedure: LEFT HEART CATH AND CORONARY ANGIOGRAPHY;  Surgeon: Wellington Hampshire, MD;  Location: [place] CV LAB;  Service: Cardiovascular;  Laterality: N/A;   LUNG LOBECTOMY  10/2020   LYMPH NODE DISSECTION Left 11/13/2020   Procedure: LYMPH NODE DISSECTION;  Surgeon: Melrose Nakayama, MD;  Location: [facility] OR;  Service: Thoracic;   Laterality: Left;   RIGHT/LEFT HEART CATH AND CORONARY/GRAFT ANGIOGRAPHY N/A 07/27/2020   Procedure: RIGHT/LEFT HEART CATH AND CORONARY/GRAFT ANGIOGRAPHY;  Surgeon: Wellington Hampshire, MD;  Location: [place] CV LAB;  Service: Cardiovascular;  Laterality: N/A;   stent in Lt leg Left    TEE WITHOUT CARDIOVERSION N/A 05/03/2020   Procedure: TRANSESOPHAGEAL ECHOCARDIOGRAM (TEE);  Surgeon: Melrose Nakayama, MD;  Location: [place];  Service: Open Heart Surgery;  Laterality: N/A;   VIDEO BRONCHOSCOPY WITH ENDOBRONCHIAL NAVIGATION N/A 09/25/2020   Procedure: VIDEO BRONCHOSCOPY WITH ENDOBRONCHIAL NAVIGATION;  Surgeon: Melrose Nakayama, MD;  Location: [facility] OR;  Service: Thoracic;  Laterality: N/A;    FAMILY HISTORY: Family History  Problem Relation Age of Onset   Hypertension Mother    Arthritis Mother    Gout Mother    Lung cancer Father    Heart attack Sister     ADVANCED DIRECTIVES (Y/N):  N  HEALTH MAINTENANCE: Social History   Tobacco Use   Smoking status: Former   Smokeless tobacco: Never  Scientific laboratory technician Use: Never used  Substance Use Topics   Alcohol use: No    Alcohol/week: 0.0 standard drinks   Drug use: No     Colonoscopy:  PAP:  Bone density:  Lipid panel:  Allergies  Allergen Reactions   Ibuprofen Hives   Penicillins Hives    Tolerated Ancef in the past   Aleve [Naproxen Sodium] Hives   Avapro  [Irbesartan] Rash   Azithromycin Hives   Nickel Itching    Redness, itching and fluid discharge with nickel earrings    Current Outpatient Medications  Medication Sig Dispense Refill   acetaminophen (TYLENOL) 500 MG tablet Take 1,000 mg by mouth every 6 (six) hours as needed for moderate pain or headache.     aspirin EC 81 MG tablet Take 81 mg by mouth daily.     atorvastatin (LIPITOR) 20 MG tablet Take 1 tablet (20 mg total) by mouth at bedtime. 90 tablet 3   Calcium Carbonate-Vit D-Min (CALCIUM 600+D3 PLUS MINERALS) 600-800 MG-UNIT TABS Take 1  tablet by mouth daily.     clopidogrel (PLAVIX) 75 MG tablet Take 1 tablet (75 mg total) by mouth daily. 90 tablet 3   diphenhydrAMINE-zinc acetate (BENADRYL) cream Apply 1 application topically daily as needed for itching.     furosemide (LASIX) 40 MG tablet Take 1 tablet (40 mg total) by mouth daily. 30 tablet 0   gabapentin (NEURONTIN) 100 MG capsule Take 1 capsule (100 mg total) by mouth 3 (three) times daily. 90 capsule 3   metoprolol succinate (TOPROL-XL) 50 MG 24 hr tablet Take 1 tablet (50 mg total) by mouth daily. Take with or immediately following a meal. 30 tablet 0   oxyCODONE (OXY IR/ROXICODONE) 5 MG immediate release tablet Take 1 tablet (5 mg total) by mouth 3 (three) times daily as needed for severe pain. 15 tablet 0   potassium chloride SA (KLOR-CON) 10 MEQ tablet Take 1 tablet (10 mEq total) by mouth daily. 30 tablet 0   sacubitril-valsartan (ENTRESTO) 24-26 MG Take 1 tablet by  mouth 2 (two) times daily. 60 tablet 0   chlorpheniramine-HYDROcodone (TUSSIONEX) 10-8 MG/5ML SUER Take 5 mLs by mouth every 12 (twelve) hours as needed for cough. (Patient not taking: Reported on 12/21/2020)     Multiple Vitamin (MULTIVITAMIN WITH MINERALS) TABS tablet Take 1 tablet by mouth daily. (Patient not taking: Reported on 12/21/2020)     nitroGLYCERIN (NITROSTAT) 0.4 MG SL tablet Place 1 tablet (0.4 mg total) under the tongue every 5 (five) minutes as needed for up to 10 days for chest pain. (Patient not taking: Reported on 12/21/2020) 10 tablet 0   No current facility-administered medications for this visit.    OBJECTIVE: Vitals:   12/21/20 1015  BP: (!) 135/55  Pulse: 96  Resp: 17  Temp: (!) 96.2 F (35.7 C)  SpO2: 100%     Body mass index is 21.09 kg/m.    ECOG FS:0 - Asymptomatic  General: Thin, no acute distress. Eyes: Pink conjunctiva, anicteric sclera. HEENT: Normocephalic, moist mucous membranes. Lungs: No audible wheezing or coughing. Heart: Regular rate and rhythm. Abdomen:  Soft, nontender, no obvious distention. Musculoskeletal: No edema, cyanosis, or clubbing. Neuro: Alert, answering all questions appropriately. Cranial nerves grossly intact. Skin: No rashes or petechiae noted. Psych: Normal affect. Lymphatics: No cervical, calvicular, axillary or inguinal LAD.   LAB RESULTS:  Lab Results  Component Value Date   NA 134 (L) 11/18/2020   K 4.0 11/18/2020   CL 100 11/18/2020   CO2 25 11/18/2020   GLUCOSE 115 (H) 11/18/2020   BUN 16 11/18/2020   CREATININE 0.68 11/18/2020   CALCIUM 8.3 (L) 11/18/2020   PROT 5.5 (L) 11/15/2020   ALBUMIN 2.5 (L) 11/15/2020   AST 26 11/15/2020   ALT 25 11/15/2020   ALKPHOS 62 11/15/2020   BILITOT 1.0 11/15/2020   GFRNONAA >60 11/18/2020   GFRAA >60 01/26/2016    Lab Results  Component Value Date   WBC 10.0 11/15/2020   NEUTROABS 3.1 11/05/2020   HGB 10.6 (L) 11/15/2020   HCT 32.1 (L) 11/15/2020   MCV 100.9 (H) 11/15/2020   PLT 125 (L) 11/15/2020     STUDIES: DG Chest 2 View  Result Date: 12/12/2020 CLINICAL DATA:  Left lower lobe mass and left pleural effusion. EXAM: CHEST - 2 VIEW COMPARISON:  01/23/2020 FINDINGS: Heart size is within normal limits. Aortic atherosclerotic calcification noted. Prior CABG again noted. Surgical clips are again seen in the left hemithorax. Left lower lobe volume loss is again seen with cavitary opacity in the superior left lower lobe, which shows no significant change. A small left pleural effusion has increased in size. Right lung remains clear. IMPRESSION: No significant change in left lower lobe atelectasis, with cavitary opacity in superior left lower lobe. Increased small left pleural effusion. Electronically Signed   By: Marlaine Hind M.D.   On: 12/12/2020 13:34   DG Chest 2 View  Result Date: 11/22/2020 CLINICAL DATA:  Recent partial resection of left lung left pneumothorax EXAM: CHEST - 2 VIEW COMPARISON:  Previous studies including the examination of 11/21/2020 FINDINGS:  Transverse diameter of heart is slightly increased. There are no signs of pulmonary edema. Increased density in the left lower lung fields may suggest atelectasis. There is interval appearance of small hazy density in the medial left upper lung fields. There is blunting of left lateral CP angle. There is interval resolution of minimal left apical pneumothorax. There is decrease in subcutaneous emphysema in the left chest wall. IMPRESSION: Infiltrates in the left lower lung fields  suggest atelectasis/pneumonitis. There is new small focal infiltrate in the medial left upper lung fields. Small left pleural effusion. There is no pneumothorax. Electronically Signed   By: Elmer Picker M.D.   On: 11/22/2020 09:14   VAS [place] ABI WITH/WO TBI  Result Date: 12/14/2020  LOWER EXTREMITY DOPPLER STUDY Patient Name:  CHASIDY JANAK  Date of Exam:   12/11/2020 Medical Rec #: [id number]        Accession #:    [id number] Date of Birth: [birth year]        Patient Gender: F Patient Age:   41 years Exam Location:  [facility] Procedure:      VAS [place] ABI WITH/WO TBI Referring Phys: Hortencia Pilar --------------------------------------------------------------------------------  Indications: Peripheral artery disease.  Vascular Interventions: Left SFA stent many years ago. Comparison Study: 11/18/2019 Performing Technologist: Almira Coaster RVS  Examination Guidelines: A complete evaluation includes at minimum, Doppler waveform signals and systolic blood pressure reading at the level of bilateral brachial, anterior tibial, and posterior tibial arteries, when vessel segments are accessible. Bilateral testing is considered an integral part of a complete examination. Photoelectric Plethysmograph (PPG) waveforms and toe systolic pressure readings are included as required and additional duplex testing as needed. Limited examinations for reoccurring indications may be performed as noted.  ABI Findings:  +---------+------------------+-----+--------+--------+ Right    Rt Pressure (mmHg)IndexWaveformComment  +---------+------------------+-----+--------+--------+ Brachial 148                                     +---------+------------------+-----+--------+--------+ ATA      125                    biphasic.84      +---------+------------------+-----+--------+--------+ PTA      130               0.88 biphasic         +---------+------------------+-----+--------+--------+ Great Toe119               0.80 Normal           +---------+------------------+-----+--------+--------+ +---------+------------------+-----+--------+---------------+ Left     Lt Pressure (mmHg)IndexWaveformComment         +---------+------------------+-----+--------+---------------+ Brachial                                Nodules removed +---------+------------------+-----+--------+---------------+ ATA      111                    biphasic.75             +---------+------------------+-----+--------+---------------+ PTA      106               0.72 biphasic                +---------+------------------+-----+--------+---------------+ Great Toe53                0.36                         +---------+------------------+-----+--------+---------------+ +-------+-----------+-----------+------------+------------+ ABI/TBIToday's ABIToday's TBIPrevious ABIPrevious TBI +-------+-----------+-----------+------------+------------+ Right  .88        .80        1.11        .71          +-------+-----------+-----------+------------+------------+ Left   .75        .36  1.17        .87          +-------+-----------+-----------+------------+------------+  Bilateral ABIs appear decreased compared to prior study on 11/18/2019. Right TBIs appear increased compared to prior study on 11/18/2019. Left TBIs appear decreased compared to prior study on 11/18/2019.  Summary: Right: Resting right  ankle-brachial index indicates mild right lower extremity arterial disease. The right toe-brachial index is normal. Left: Resting left ankle-brachial index indicates moderate left lower extremity arterial disease. The left toe-brachial index is abnormal.  *See table(s) above for measurements and observations.  Electronically signed by Hortencia Pilar MD on 12/14/2020 at 38:17:12 PM.    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  MRI of the brain on October 19, 2020 was negative for disease.  Given her stage, patient will benefit from adjuvant chemotherapy using cisplatin and Taxotere every 3 weeks for 4 cycles with Udenyca support.  Patient will require port placement prior to initiating treatment.  She has requested to wait several weeks to continue to recover from her surgery.  We will have a video assisted telemedicine visit on January 10, 2021 and then follow-up 1 week later to initiate cycle 1 of 4 of treatment. 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  Continue yearly screening mammograms. Cardiac disease: Continue follow-up with cardiology as scheduled.  I spent a total of 60 minutes reviewing chart data, face-to-face evaluation with the patient, counseling and coordination of care as detailed above.   Patient expressed understanding and was in agreement with this plan. She also understands that She can call clinic at any time with any questions, concerns, or complaints.    Cancer Staging  Primary squamous cell carcinoma of lower lobe of left lung ([place]) Staging form: Lung, AJCC 8th Edition - Pathologic stage from 12/21/2020: Stage IIIB (ypT3, pN2, cM0) - Signed by Lloyd Huger, MD on 12/21/2020 Stage prefix: Post-therapy   Lloyd Huger, MD   12/21/2020 1:22 PM

## 2020-12-21 ENCOUNTER — Encounter: Payer: Self-pay | Admitting: *Deleted

## 2020-12-21 ENCOUNTER — Inpatient Hospital Stay: Payer: Medicare Other | Attending: Oncology | Admitting: Oncology

## 2020-12-21 ENCOUNTER — Encounter: Payer: Self-pay | Admitting: Oncology

## 2020-12-21 ENCOUNTER — Inpatient Hospital Stay: Payer: Medicare Other

## 2020-12-21 ENCOUNTER — Ambulatory Visit (INDEPENDENT_AMBULATORY_CARE_PROVIDER_SITE_OTHER): Payer: Medicare Other

## 2020-12-21 ENCOUNTER — Other Ambulatory Visit: Payer: Self-pay

## 2020-12-21 VITALS — BP 135/55 | HR 96 | Temp 96.2°F | Resp 17 | Wt 108.0 lb

## 2020-12-21 DIAGNOSIS — Z79899 Other long term (current) drug therapy: Secondary | ICD-10-CM | POA: Diagnosis not present

## 2020-12-21 DIAGNOSIS — I5023 Acute on chronic systolic (congestive) heart failure: Secondary | ICD-10-CM

## 2020-12-21 DIAGNOSIS — C3432 Malignant neoplasm of lower lobe, left bronchus or lung: Secondary | ICD-10-CM | POA: Diagnosis present

## 2020-12-21 DIAGNOSIS — Z801 Family history of malignant neoplasm of trachea, bronchus and lung: Secondary | ICD-10-CM | POA: Insufficient documentation

## 2020-12-21 DIAGNOSIS — Z8261 Family history of arthritis: Secondary | ICD-10-CM | POA: Insufficient documentation

## 2020-12-21 DIAGNOSIS — R5383 Other fatigue: Secondary | ICD-10-CM | POA: Insufficient documentation

## 2020-12-21 DIAGNOSIS — I509 Heart failure, unspecified: Secondary | ICD-10-CM | POA: Insufficient documentation

## 2020-12-21 DIAGNOSIS — Z923 Personal history of irradiation: Secondary | ICD-10-CM | POA: Diagnosis not present

## 2020-12-21 DIAGNOSIS — Z886 Allergy status to analgesic agent status: Secondary | ICD-10-CM | POA: Insufficient documentation

## 2020-12-21 DIAGNOSIS — J90 Pleural effusion, not elsewhere classified: Secondary | ICD-10-CM | POA: Insufficient documentation

## 2020-12-21 DIAGNOSIS — Z8249 Family history of ischemic heart disease and other diseases of the circulatory system: Secondary | ICD-10-CM | POA: Insufficient documentation

## 2020-12-21 DIAGNOSIS — E785 Hyperlipidemia, unspecified: Secondary | ICD-10-CM | POA: Diagnosis not present

## 2020-12-21 DIAGNOSIS — Z9049 Acquired absence of other specified parts of digestive tract: Secondary | ICD-10-CM | POA: Insufficient documentation

## 2020-12-21 DIAGNOSIS — Z881 Allergy status to other antibiotic agents status: Secondary | ICD-10-CM | POA: Insufficient documentation

## 2020-12-21 DIAGNOSIS — Z853 Personal history of malignant neoplasm of breast: Secondary | ICD-10-CM | POA: Diagnosis not present

## 2020-12-21 DIAGNOSIS — I252 Old myocardial infarction: Secondary | ICD-10-CM | POA: Insufficient documentation

## 2020-12-21 DIAGNOSIS — R531 Weakness: Secondary | ICD-10-CM | POA: Insufficient documentation

## 2020-12-21 DIAGNOSIS — I2510 Atherosclerotic heart disease of native coronary artery without angina pectoris: Secondary | ICD-10-CM | POA: Insufficient documentation

## 2020-12-21 DIAGNOSIS — I110 Hypertensive heart disease with heart failure: Secondary | ICD-10-CM | POA: Insufficient documentation

## 2020-12-21 DIAGNOSIS — I739 Peripheral vascular disease, unspecified: Secondary | ICD-10-CM | POA: Diagnosis not present

## 2020-12-21 DIAGNOSIS — Z880 Allergy status to penicillin: Secondary | ICD-10-CM | POA: Insufficient documentation

## 2020-12-21 DIAGNOSIS — Z87891 Personal history of nicotine dependence: Secondary | ICD-10-CM | POA: Insufficient documentation

## 2020-12-21 DIAGNOSIS — J984 Other disorders of lung: Secondary | ICD-10-CM | POA: Diagnosis not present

## 2020-12-21 DIAGNOSIS — C3490 Malignant neoplasm of unspecified part of unspecified bronchus or lung: Secondary | ICD-10-CM

## 2020-12-21 MED ORDER — ONDANSETRON HCL 8 MG PO TABS: 8.0000 mg | ORAL_TABLET | Freq: Two times a day (BID) | ORAL | 1 refills | Status: DC | PRN

## 2020-12-21 MED ORDER — PROCHLORPERAZINE MALEATE 10 MG PO TABS: 10.0000 mg | ORAL_TABLET | Freq: Four times a day (QID) | ORAL | 1 refills | Status: DC | PRN

## 2020-12-21 MED ORDER — PERFLUTREN LIPID MICROSPHERE
1.0000 mL | INTRAVENOUS | Status: AC | PRN
  Administered 2020-12-21: 2 mL via INTRAVENOUS

## 2020-12-21 MED ORDER — LIDOCAINE-PRILOCAINE 2.5-2.5 % EX CREA: TOPICAL_CREAM | CUTANEOUS | 3 refills | Status: DC

## 2020-12-21 NOTE — Progress Notes (Signed)
Patient here for initial oncology appointment, expresses concerns of SOB

## 2020-12-21 NOTE — Progress Notes (Signed)
START ON PATHWAY REGIMEN - Non-Small Cell Lung     A cycle is every 21 days:     Docetaxel      Cisplatin   **Always confirm dose/schedule in your pharmacy ordering system**  Patient Characteristics: Postoperative without Neoadjuvant Therapy (Pathologic Staging), Stage III, Adjuvant Chemotherapy, Squamous Cell Therapeutic Status: Postoperative without Neoadjuvant Therapy (Pathologic Staging) AJCC T Category: pT3 AJCC N Category: pN2 AJCC M Category: cM0 AJCC 8 Stage Grouping: IIIB Histology: Squamous Cell Intent of Therapy: Curative Intent, Discussed with Patient

## 2020-12-21 NOTE — Progress Notes (Signed)
Met with patient during initial consult with Dr. Grayland Ormond to discuss diagnosis and treatment options. All questions answered during visit. Reviewed upcoming appts and informed that will be working on getting her scheduled for port placement prior to starting chemo. Contact info given and instructed to call with any further questions or needs. Pt verbalized understanding.

## 2020-12-22 LAB — ECHOCARDIOGRAM COMPLETE
AR max vel: 2.13 cm2
AV Area VTI: 2.24 cm2
AV Area mean vel: 1.99 cm2
AV Mean grad: 2 mmHg
AV Peak grad: 3.3 mmHg
Ao pk vel: 0.9 m/s
S' Lateral: 3.95 cm
Single Plane A4C EF: 39.8 %
Weight: 1728 oz

## 2020-12-26 ENCOUNTER — Encounter: Payer: Self-pay | Admitting: *Deleted

## 2020-12-28 ENCOUNTER — Ambulatory Visit: Payer: Medicare Other | Admitting: Radiology

## 2021-01-01 ENCOUNTER — Other Ambulatory Visit: Payer: Self-pay | Admitting: Thoracic Surgery (Cardiothoracic Vascular Surgery)

## 2021-01-01 DIAGNOSIS — Z951 Presence of aortocoronary bypass graft: Secondary | ICD-10-CM

## 2021-01-02 ENCOUNTER — Ambulatory Visit
Admission: RE | Admit: 2021-01-02 | Discharge: 2021-01-02 | Disposition: A | Discharge status: Home / Self Care | Payer: Medicare Other | Source: Ambulatory Visit | Attending: Thoracic Surgery (Cardiothoracic Vascular Surgery) | Admitting: Thoracic Surgery (Cardiothoracic Vascular Surgery)

## 2021-01-02 ENCOUNTER — Other Ambulatory Visit: Payer: Self-pay

## 2021-01-02 ENCOUNTER — Ambulatory Visit (INDEPENDENT_AMBULATORY_CARE_PROVIDER_SITE_OTHER): Payer: Self-pay | Admitting: Thoracic Surgery (Cardiothoracic Vascular Surgery)

## 2021-01-02 VITALS — BP 114/71 | HR 64 | Resp 20 | Ht 60.0 in | Wt 109.0 lb

## 2021-01-02 DIAGNOSIS — Z951 Presence of aortocoronary bypass graft: Secondary | ICD-10-CM

## 2021-01-02 DIAGNOSIS — Z902 Acquired absence of lung [part of]: Secondary | ICD-10-CM

## 2021-01-02 NOTE — Progress Notes (Signed)
[address]       [place], [address]             Sims     HPI: [name] returns for a scheduled follow-up visit  [name] is a 80 year old woman who underwent coronary bypass grafting x4 for left main and three-vessel disease back in April.  During her work-up she was noted to have a left lower lobe lung mass.  On follow-up CT the mass was unchanged and on PET/CT it was markedly hypermetabolic.  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  She underwent a robotic assisted extrapleural left lower lobectomy on 11/13/2020.  She had an air leak postoperatively and a good deal of pain.  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  I last saw her on 12/12/2020.  She was feeling tired and out of breath and was having sharp stabbing pains.  We started her on gabapentin and also gave her prescription for oxycodone.  She says that she feels better.  She still has a pain right along the right costal margin.  Its not present all the time.  It has improved.  She only took one of the oxycodone tablets.  She is taking the gabapentin 3 times a day.  She saw Dr. Grayland Ormond.  She is going to start chemotherapy after the first of the year.  Past Medical History:  Diagnosis Date   Arthritis    Breast cancer ([place]) 2011   radiation- Left   CHF (congestive heart failure) (HCC)    Coronary artery disease    Dyspnea    Hyperlipidemia    Hypertension    Myocardial infarction [facility])    Personal history of radiation therapy    PVD (peripheral vascular disease) (H[facility])    Current Outpatient Medications  Medication Sig Dispense Refill   acetaminophen (TYLENOL) 500 MG tablet Take 1,000 mg by mouth every 6 (six) hours as needed for moderate pain or headache.     aspirin EC 81 MG tablet Take 81 mg by mouth daily.     atorvastatin (LIPITOR) 20 MG tablet Take 1 tablet (20 mg total) by mouth at bedtime. 90 tablet 3   Calcium Carbonate-Vit  D-Min (CALCIUM 600+D3 PLUS MINERALS) 600-800 MG-UNIT TABS Take 1 tablet by mouth daily.     chlorpheniramine-HYDROcodone (TUSSIONEX) 10-8 MG/5ML SUER Take 5 mLs by mouth every 12 (twelve) hours as needed for cough.     clopidogrel (PLAVIX) 75 MG tablet Take 1 tablet (75 mg total) by mouth daily. 90 tablet 3   diphenhydrAMINE-zinc acetate (BENADRYL) cream Apply 1 application topically daily as needed for itching.     gabapentin (NEURONTIN) 100 MG capsule Take 1 capsule (100 mg total) by mouth 3 (three) times daily. 90 capsule 3   Multiple Vitamin (MULTIVITAMIN WITH MINERALS) TABS tablet Take 1 tablet by mouth daily.     nitroGLYCERIN (NITROSTAT) 0.4 MG SL tablet Place 1 tablet (0.4 mg total) under the tongue every 5 (five) minutes as needed for up to 10 days for chest pain. 10 tablet 0   prochlorperazine (COMPAZINE) 10 MG tablet Take 1 tablet (10 mg total) by mouth every 6 (six) hours as needed (Nausea or vomiting). 60 tablet 1   furosemide (LASIX) 40 MG tablet Take 1 tablet (40 mg total) by mouth daily. 30 tablet 0   metoprolol succinate (TOPROL-XL) 50 MG 24 hr tablet Take 1 tablet (50 mg total) by mouth  daily. Take with or immediately following a meal. 30 tablet 0   ondansetron (ZOFRAN) 8 MG tablet Take 1 tablet (8 mg total) by mouth 2 (two) times daily as needed (Nausea or vomiting). (Patient not taking: Reported on 01/02/2021) 60 tablet 1   oxyCODONE (OXY IR/ROXICODONE) 5 MG immediate release tablet Take 1 tablet (5 mg total) by mouth 3 (three) times daily as needed for severe pain. (Patient not taking: Reported on 01/02/2021) 15 tablet 0   potassium chloride SA (KLOR-CON) 10 MEQ tablet Take 1 tablet (10 mEq total) by mouth daily. 30 tablet 0   sacubitril-valsartan (ENTRESTO) 24-26 MG Take 1 tablet by mouth 2 (two) times daily. 60 tablet 0   No current facility-administered medications for this visit.    Physical Exam BP 114/71 (BP Location: Right Arm, Patient Position: Sitting)    Pulse 64     Resp 20    Ht 5' (1.524 m)    Wt 109 lb (49.4 kg)    SpO2 100% Comment: RA   BMI 21.48 kg/m  80 year old woman in no acute distress Alert and oriented x3 with no focal deficits Lungs absent breath sounds left base but otherwise clear Incisions healing well Cardiac regular rate and rhythm  Diagnostic Tests: CHEST - 2 VIEW   COMPARISON:  Chest x-ray 12/12/2020   FINDINGS: Cardiomediastinal silhouette is unchanged. Mild cardiomegaly. Calcified plaques in the aortic arch. Cardiac surgical changes and median sternotomy wires. Surgical changes in the posterior left chest from previous mass resection. There is persistent and stable small left pleural effusion with associated atelectasis at the left lung base. No pneumothorax.   IMPRESSION: 1. Stable small left pleural effusion with associated atelectasis. 2. Cardiomegaly and postsurgical changes.     Electronically Signed   By: Ofilia Neas M.D.   On: 01/02/2021 09:16 I personally reviewed the chest x-ray images.  Stable postoperative changes.  Impression: [name] is a 80 year old woman who underwent a robotic assisted extrapleural left lower lobectomy for a T3, N2, stage IIIb non-small cell carcinoma.  She is now almost 2 months out from surgery.  She had a rough time initially but looks much better today.  She does still have some pain.  Gabapentin has helped with that.  I recommend she stay on that for at least 3 months and probably about 6 months.  She did have: N2 disease and needs chemotherapy.  She can start that after the first of the year.  She may begin driving on a limited basis.  Plan: Return in 3 months with PA and lateral chest x-ray  Melrose Nakayama, MD [facility] [phone]

## 2021-01-08 NOTE — Progress Notes (Signed)
[place]  [phone] [phone]  ID: [name] OB: 19-Oct-1940  MR#: [id number]  CSN#:[id number]  Patient Care Team: Derinda Late, MD as PCP - General (Family Medicine) Telford Nab, RN as Oncology Nurse Navigator  I connected with [name] on 01/10/21 at 11:00 AM EST by video enabled telemedicine visit and verified that I am speaking with the correct person using two identifiers.   I discussed the limitations, risks, security and privacy concerns of performing an evaluation and management service by telemedicine and the availability of in-person appointments. I also discussed with the patient that there may be a patient responsible charge related to this service. The patient expressed understanding and agreed to proceed.   Other persons participating in the visit and their role in the encounter: Patient, MD.  Patients location: Home. Providers location: Clinic.  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  INTERVAL HISTORY: Patient agreed to video assisted telemedicine visit for further evaluation.  She continues to have surgical site tenderness and dyspnea on exertion, but states this is improving.  She otherwise feels well.  She has no neurologic complaints.  She denies any recent fevers.  She has a fair appetite, but denies weight loss.  She has no chest pain, shortness of breath, cough, or hemoptysis.  She denies any nausea, vomiting, constipation, or diarrhea.  She has no urinary complaints.  Patient offers no further specific complaints today.  REVIEW OF SYSTEMS:   Review of Systems  Constitutional: Negative.  Negative for fever, malaise/fatigue and weight loss.  Respiratory:  Positive for shortness of breath. Negative for cough and hemoptysis.   Cardiovascular: Negative.  Negative for chest pain and leg swelling.  Gastrointestinal: Negative.  Negative for abdominal pain.   Genitourinary: Negative.  Negative for dysuria.  Musculoskeletal: Negative.  Negative for back pain.  Skin: Negative.  Negative for rash.  Neurological: Negative.  Negative for dizziness, focal weakness, weakness and headaches.  Psychiatric/Behavioral: Negative.  The patient is not nervous/anxious.    As per HPI. Otherwise, a complete review of systems is negative.  PAST MEDICAL HISTORY: Past Medical History:  Diagnosis Date   Arthritis    Breast cancer ([place]) 2011   radiation- Left   CHF (congestive heart failure) (H[facility])    Coronary artery disease    Dyspnea    Hyperlipidemia    Hypertension    Myocardial infarction [facility])    Personal history of radiation therapy    PVD (peripheral vascular disease) ([place])     PAST SURGICAL HISTORY: Past Surgical History:  Procedure Laterality Date   ABDOMINAL HYSTERECTOMY     APPENDECTOMY     BREAST BIOPSY Left 12/21/2009   positive, radiation dcis   BREAST EXCISIONAL BIOPSY Left 01/17/2010   lumpectomy DCIS   BREAST LUMPECTOMY     CATARACT EXTRACTION W/ INTRAOCULAR LENS IMPLANT & ANTERIOR VITRECTOMY, BILATERAL     CHOLECYSTECTOMY     COLONOSCOPY WITH PROPOFOL N/A 09/25/2017   Procedure: COLONOSCOPY WITH PROPOFOL;  Surgeon: Lollie Sails, MD;  Location: [facility] ENDOSCOPY;  Service: Endoscopy;  Laterality: N/A;   CORONARY ARTERY BYPASS GRAFT N/A 05/03/2020   Procedure: CORONARY ARTERY BYPASS GRAFTING (CABG) X FOUR ON PUMP USING LEFT INTERNAL MAMMARY ARTERY AND RIGHT ENDOSCOPIC SAPHEOUS VEIN HARVEST CONDUITS;  Surgeon: Melrose Nakayama, MD;  Location: [place];  Service: Open Heart Surgery;  Laterality: N/A;   DILATION AND CURETTAGE OF UTERUS     EYE SURGERY  bilateral cataracts   INTERCOSTAL NERVE BLOCK Left 11/13/2020   Procedure: INTERCOSTAL NERVE BLOCK;  Surgeon: Melrose Nakayama, MD;  Location: [place];  Service: Thoracic;  Laterality: Left;   JOINT REPLACEMENT Right 01/24/2016   KNEE ARTHROPLASTY Right 01/24/2016    Procedure: COMPUTER ASSISTED TOTAL KNEE ARTHROPLASTY;  Surgeon: Dereck Leep, MD;  Location: [facility] ORS;  Service: Orthopedics;  Laterality: Right;   LEFT HEART CATH AND CORONARY ANGIOGRAPHY N/A 05/01/2020   Procedure: LEFT HEART CATH AND CORONARY ANGIOGRAPHY;  Surgeon: Wellington Hampshire, MD;  Location: [place] CV LAB;  Service: Cardiovascular;  Laterality: N/A;   LUNG LOBECTOMY  10/2020   LYMPH NODE DISSECTION Left 11/13/2020   Procedure: LYMPH NODE DISSECTION;  Surgeon: Melrose Nakayama, MD;  Location: [facility] OR;  Service: Thoracic;  Laterality: Left;   RIGHT/LEFT HEART CATH AND CORONARY/GRAFT ANGIOGRAPHY N/A 07/27/2020   Procedure: RIGHT/LEFT HEART CATH AND CORONARY/GRAFT ANGIOGRAPHY;  Surgeon: Wellington Hampshire, MD;  Location: [place] CV LAB;  Service: Cardiovascular;  Laterality: N/A;   stent in Lt leg Left    TEE WITHOUT CARDIOVERSION N/A 05/03/2020   Procedure: TRANSESOPHAGEAL ECHOCARDIOGRAM (TEE);  Surgeon: Melrose Nakayama, MD;  Location: [place];  Service: Open Heart Surgery;  Laterality: N/A;   VIDEO BRONCHOSCOPY WITH ENDOBRONCHIAL NAVIGATION N/A 09/25/2020   Procedure: VIDEO BRONCHOSCOPY WITH ENDOBRONCHIAL NAVIGATION;  Surgeon: Melrose Nakayama, MD;  Location: [facility] OR;  Service: Thoracic;  Laterality: N/A;    FAMILY HISTORY: Family History  Problem Relation Age of Onset   Hypertension Mother    Arthritis Mother    Gout Mother    Lung cancer Father    Heart attack Sister     ADVANCED DIRECTIVES (Y/N):  N  HEALTH MAINTENANCE: Social History   Tobacco Use   Smoking status: Former   Smokeless tobacco: Never  Scientific laboratory technician Use: Never used  Substance Use Topics   Alcohol use: No    Alcohol/week: 0.0 standard drinks   Drug use: No     Colonoscopy:  PAP:  Bone density:  Lipid panel:  Allergies  Allergen Reactions   Ibuprofen Hives   Penicillins Hives    Tolerated Ancef in the past   Aleve [Naproxen Sodium] Hives   Avapro  [Irbesartan]  Rash   Azithromycin Hives   Nickel Itching    Redness, itching and fluid discharge with nickel earrings    Current Outpatient Medications  Medication Sig Dispense Refill   acetaminophen (TYLENOL) 500 MG tablet Take 1,000 mg by mouth every 6 (six) hours as needed for moderate pain or headache.     aspirin EC 81 MG tablet Take 81 mg by mouth daily.     atorvastatin (LIPITOR) 20 MG tablet Take 1 tablet (20 mg total) by mouth at bedtime. 90 tablet 3   Calcium Carbonate-Vit D-Min (CALCIUM 600+D3 PLUS MINERALS) 600-800 MG-UNIT TABS Take 1 tablet by mouth daily.     chlorpheniramine-HYDROcodone (TUSSIONEX) 10-8 MG/5ML SUER Take 5 mLs by mouth every 12 (twelve) hours as needed for cough.     clopidogrel (PLAVIX) 75 MG tablet Take 1 tablet (75 mg total) by mouth daily. 90 tablet 3   diphenhydrAMINE-zinc acetate (BENADRYL) cream Apply 1 application topically daily as needed for itching.     gabapentin (NEURONTIN) 100 MG capsule Take 1 capsule (100 mg total) by mouth 3 (three) times daily. 90 capsule 3   Multiple Vitamin (MULTIVITAMIN WITH MINERALS) TABS tablet Take 1 tablet by mouth daily.  nitroGLYCERIN (NITROSTAT) 0.4 MG SL tablet Place 1 tablet (0.4 mg total) under the tongue every 5 (five) minutes as needed for up to 10 days for chest pain. 10 tablet 0   prochlorperazine (COMPAZINE) 10 MG tablet Take 1 tablet (10 mg total) by mouth every 6 (six) hours as needed (Nausea or vomiting). 60 tablet 1   furosemide (LASIX) 40 MG tablet Take 1 tablet (40 mg total) by mouth daily. 30 tablet 0   gabapentin (NEURONTIN) 100 MG capsule Take 1 capsule by mouth 3 (three) times daily.     metoprolol succinate (TOPROL-XL) 50 MG 24 hr tablet Take 1 tablet (50 mg total) by mouth daily. Take with or immediately following a meal. 30 tablet 0   ondansetron (ZOFRAN) 8 MG tablet Take 1 tablet (8 mg total) by mouth 2 (two) times daily as needed (Nausea or vomiting). (Patient not taking: Reported on 01/02/2021) 60 tablet 1    oxyCODONE (OXY IR/ROXICODONE) 5 MG immediate release tablet Take 1 tablet (5 mg total) by mouth 3 (three) times daily as needed for severe pain. (Patient not taking: Reported on 01/02/2021) 15 tablet 0   potassium chloride SA (KLOR-CON) 10 MEQ tablet Take 1 tablet (10 mEq total) by mouth daily. 30 tablet 0   sacubitril-valsartan (ENTRESTO) 24-26 MG Take 1 tablet by mouth 2 (two) times daily. 60 tablet 0   No current facility-administered medications for this visit.    OBJECTIVE: There were no vitals filed for this visit.    There is no height or weight on file to calculate BMI.    ECOG FS:0 - Asymptomatic  General: Well-developed, well-nourished, no acute distress. HEENT: Normocephalic. Neuro: Alert, answering all questions appropriately. Cranial nerves grossly intact. Psych: Normal affect.   LAB RESULTS:  Lab Results  Component Value Date   NA 134 (L) 11/18/2020   K 4.0 11/18/2020   CL 100 11/18/2020   CO2 25 11/18/2020   GLUCOSE 115 (H) 11/18/2020   BUN 16 11/18/2020   CREATININE 0.68 11/18/2020   CALCIUM 8.3 (L) 11/18/2020   PROT 5.5 (L) 11/15/2020   ALBUMIN 2.5 (L) 11/15/2020   AST 26 11/15/2020   ALT 25 11/15/2020   ALKPHOS 62 11/15/2020   BILITOT 1.0 11/15/2020   GFRNONAA >60 11/18/2020   GFRAA >60 01/26/2016    Lab Results  Component Value Date   WBC 10.0 11/15/2020   NEUTROABS 3.1 11/05/2020   HGB 10.6 (L) 11/15/2020   HCT 32.1 (L) 11/15/2020   MCV 100.9 (H) 11/15/2020   PLT 125 (L) 11/15/2020     STUDIES: DG Chest 2 View  Result Date: 01/02/2021 CLINICAL DATA:  Chest pain EXAM: CHEST - 2 VIEW COMPARISON:  Chest x-ray 12/12/2020 FINDINGS: Cardiomediastinal silhouette is unchanged. Mild cardiomegaly. Calcified plaques in the aortic arch. Cardiac surgical changes and median sternotomy wires. Surgical changes in the posterior left chest from previous mass resection. There is persistent and stable small left pleural effusion with associated atelectasis at  the left lung base. No pneumothorax. IMPRESSION: 1. Stable small left pleural effusion with associated atelectasis. 2. Cardiomegaly and postsurgical changes. Electronically Signed   By: Ofilia Neas M.D.   On: 01/02/2021 09:16   DG Chest 2 View  Result Date: 12/12/2020 CLINICAL DATA:  Left lower lobe mass and left pleural effusion. EXAM: CHEST - 2 VIEW COMPARISON:  01/23/2020 FINDINGS: Heart size is within normal limits. Aortic atherosclerotic calcification noted. Prior CABG again noted. Surgical clips are again seen in the left hemithorax. Left lower  lobe volume loss is again seen with cavitary opacity in the superior left lower lobe, which shows no significant change. A small left pleural effusion has increased in size. Right lung remains clear. IMPRESSION: No significant change in left lower lobe atelectasis, with cavitary opacity in superior left lower lobe. Increased small left pleural effusion. Electronically Signed   By: Marlaine Hind M.D.   On: 12/12/2020 13:34   VAS [place] ABI WITH/WO TBI  Result Date: 12/14/2020  LOWER EXTREMITY DOPPLER STUDY Patient Name:  JUDIA ARNOTT  Date of Exam:   12/11/2020 Medical Rec #: [id number]        Accession #:    [id number] Date of Birth: 04/05/1940        Patient Gender: F Patient Age:   80 years Exam Location:  [facility] Procedure:      VAS [place] ABI WITH/WO TBI Referring Phys: Hortencia Pilar --------------------------------------------------------------------------------  Indications: Peripheral artery disease.  Vascular Interventions: Left SFA stent many years ago. Comparison Study: 11/18/2019 Performing Technologist: Almira Coaster RVS  Examination Guidelines: A complete evaluation includes at minimum, Doppler waveform signals and systolic blood pressure reading at the level of bilateral brachial, anterior tibial, and posterior tibial arteries, when vessel segments are accessible. Bilateral testing is considered an integral part of a complete  examination. Photoelectric Plethysmograph (PPG) waveforms and toe systolic pressure readings are included as required and additional duplex testing as needed. Limited examinations for reoccurring indications may be performed as noted.  ABI Findings: +---------+------------------+-----+--------+--------+  Right     Rt Pressure (mmHg) Index Waveform Comment   +---------+------------------+-----+--------+--------+  Brachial  148                                         +---------+------------------+-----+--------+--------+  ATA       125                      biphasic .84       +---------+------------------+-----+--------+--------+  PTA       130                0.88  biphasic           +---------+------------------+-----+--------+--------+  Great Toe 119                0.80  Normal             +---------+------------------+-----+--------+--------+ +---------+------------------+-----+--------+---------------+  Left      Lt Pressure (mmHg) Index Waveform Comment          +---------+------------------+-----+--------+---------------+  Brachial                                    Nodules removed  +---------+------------------+-----+--------+---------------+  ATA       111                      biphasic .75              +---------+------------------+-----+--------+---------------+  PTA       106                0.72  biphasic                  +---------+------------------+-----+--------+---------------+  Great Toe 53  0.36                            +---------+------------------+-----+--------+---------------+ +-------+-----------+-----------+------------+------------+  ABI/TBI Today's ABI Today's TBI Previous ABI Previous TBI  +-------+-----------+-----------+------------+------------+  Right   .88         .80         1.11         .71           +-------+-----------+-----------+------------+------------+  Left    .75         .36         1.17         .87            +-------+-----------+-----------+------------+------------+  Bilateral ABIs appear decreased compared to prior study on 11/18/2019. Right TBIs appear increased compared to prior study on 11/18/2019. Left TBIs appear decreased compared to prior study on 11/18/2019.  Summary: Right: Resting right ankle-brachial index indicates mild right lower extremity arterial disease. The right toe-brachial index is normal. Left: Resting left ankle-brachial index indicates moderate left lower extremity arterial disease. The left toe-brachial index is abnormal.  *See table(s) above for measurements and observations.  Electronically signed by Hortencia Pilar MD on 12/14/2020 at 8:17:12 PM.    Final    ECHOCARDIOGRAM COMPLETE  Result Date: 12/22/2020    ECHOCARDIOGRAM REPORT   Patient Name:   RAYDEN SCHEPER Date of Exam: 12/21/2020 Medical Rec #:  [id number]       Height:       60.0 in Accession #:    [id number]      Weight:       110.0 lb Date of Birth:  1940-07-17       BSA:          1.448 m Patient Age:    70 years        BP:           140/68 mmHg Patient Gender: F               HR:           80 bpm. Exam Location:  [place] Procedure: 2D Echo, Color Doppler, Cardiac Doppler and Intracardiac            Opacification Agent Indications:    I50.23 Acute on chronic systolic (congestive) heart failure  History:        Patient has prior history of Echocardiogram examinations, most                 recent 07/25/2020. CAD and Previous Myocardial Infarction, Prior                 CABG; Risk Factors:Hypertension, Dyslipidemia and Former Smoker.  Sonographer:    Caesar Chestnut RDCS, RVT Sonographer#2:  Wilkie Aye RVT Referring Phys: [id number] [place]  1. Left ventricular ejection fraction, by estimation, is 20 to 25%. The left ventricle has severely decreased function. The left ventricle demonstrates global hypokinesis , inferior/posterior/lateral wall motion best preserved. The left ventricular internal cavity size was  moderately dilated. There is mild left ventricular hypertrophy. Left ventricular diastolic parameters are indeterminate.  2. Right ventricular systolic function is normal. The right ventricular size is normal. There is normal pulmonary artery systolic pressure. The estimated right ventricular systolic pressure is 47.4 mmHg.  3. The mitral valve is normal in structure. Mild mitral valve regurgitation. No evidence of mitral stenosis.  4. The aortic valve is  normal in structure. Aortic valve regurgitation is not visualized. No aortic stenosis is present.  5. The inferior vena cava is normal in size with greater than 50% respiratory variability, suggesting right atrial pressure of 3 mmHg. Comparison(s): LVEF 20-25%. FINDINGS  Left Ventricle: Left ventricular ejection fraction, by estimation, is 20 to 25%. The left ventricle has severely decreased function. The left ventricle demonstrates global hypokinesis. Definity contrast agent was given IV to delineate the left ventricular endocardial borders. The left ventricular internal cavity size was moderately dilated. There is mild left ventricular hypertrophy. Left ventricular diastolic parameters are indeterminate. Right Ventricle: The right ventricular size is normal. No increase in right ventricular wall thickness. Right ventricular systolic function is normal. There is normal pulmonary artery systolic pressure. The tricuspid regurgitant velocity is 2.42 m/s, and  with an assumed right atrial pressure of 5 mmHg, the estimated right ventricular systolic pressure is 54.2 mmHg. Left Atrium: Left atrial size was normal in size. Right Atrium: Right atrial size was normal in size. Pericardium: There is no evidence of pericardial effusion. Mitral Valve: The mitral valve is normal in structure. Mild mitral valve regurgitation. No evidence of mitral valve stenosis. Tricuspid Valve: The tricuspid valve is normal in structure. Tricuspid valve regurgitation is mild . No evidence of  tricuspid stenosis. Aortic Valve: The aortic valve is normal in structure. Aortic valve regurgitation is not visualized. No aortic stenosis is present. Aortic valve mean gradient measures 2.0 mmHg. Aortic valve peak gradient measures 3.3 mmHg. Aortic valve area, by VTI measures 2.24 cm. Pulmonic Valve: The pulmonic valve was normal in structure. Pulmonic valve regurgitation is not visualized. No evidence of pulmonic stenosis. Aorta: The aortic root is normal in size and structure. Venous: The inferior vena cava is normal in size with greater than 50% respiratory variability, suggesting right atrial pressure of 3 mmHg. IAS/Shunts: No atrial level shunt detected by color flow Doppler.  LEFT VENTRICLE PLAX 2D LVIDd:         4.55 cm      Diastology LVIDs:         3.95 cm      LV e' medial:  4.03 cm/s LV PW:         1.05 cm      LV e' lateral: 7.83 cm/s LV IVS:        1.10 cm LVOT diam:     2.00 cm LV SV:         40 LV SV Index:   28 LVOT Area:     3.14 cm  LV Volumes (MOD) LV vol d, MOD A4C: 106.0 ml LV vol s, MOD A4C: 63.8 ml LV SV MOD A4C:     106.0 ml RIGHT VENTRICLE            IVC RV Basal diam:  3.70 cm    IVC diam: 1.20 cm RV S prime:     9.90 cm/s TAPSE (M-mode): 1.9 cm LEFT ATRIUM         Index       RIGHT ATRIUM           Index LA diam:    3.10 cm 2.14 cm/m  RA Area:     13.60 cm                                 RA Volume:   31.80 ml  21.96 ml/m  AORTIC VALVE  PULMONIC VALVE AV Area (Vmax):    2.13 cm     PV Vmax:       0.70 m/s AV Area (Vmean):   1.99 cm     PV Peak grad:  2.0 mmHg AV Area (VTI):     2.24 cm AV Vmax:           90.40 cm/s AV Vmean:          61.900 cm/s AV VTI:            0.178 m AV Peak Grad:      3.3 mmHg AV Mean Grad:      2.0 mmHg LVOT Vmax:         61.30 cm/s LVOT Vmean:        39.300 cm/s LVOT VTI:          0.127 m LVOT/AV VTI ratio: 0.71  AORTA Ao Root diam: 3.20 cm Ao Asc diam:  3.20 cm TRICUSPID VALVE TR Peak grad:   23.4 mmHg TR Vmax:        242.00 cm/s  SHUNTS  Systemic VTI:  0.13 m Systemic Diam: 2.00 cm Ida Rogue MD Electronically signed by Ida Rogue MD Signature Date/Time: 12/22/2020/6:59:10 AM    Final     ASSESSMENT: 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  MRI of the brain on October 19, 2020 was negative for disease.  Given her stage, patient will benefit from adjuvant chemotherapy using cisplatin and Taxotere every 3 weeks for 4 cycles with Udenyca support.  Patient will require port placement prior to initiating treatment which is scheduled for later this week.  Keep follow-up as scheduled on January 17, 2021 to initiate cycle 1 of 4 of treatment.   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  Continue yearly screening mammograms. Cardiac disease: Continue follow-up with cardiology as scheduled.  I provided 20 minutes of non face-to-face telephone visit time during this encounter which included chart review, counseling, and coordination of care as documented above.    Patient expressed understanding and was in agreement with this plan. She also understands that She can call clinic at any time with any questions, concerns, or complaints.    Cancer Staging  Primary squamous cell carcinoma of lower lobe of left lung [facility]) Staging form: Lung, AJCC 8th Edition - Pathologic stage from 12/21/2020: Stage IIIB (ypT3, pN2, cM0) - Signed by Lloyd Huger, MD on 12/21/2020 Stage prefix: Post-therapy   Lloyd Huger, MD   01/10/2021 12:18 PM

## 2021-01-09 NOTE — Progress Notes (Signed)
Patient ID: [name], female    DOB: Apr 01, 1940, 80 y.o.   MRN: [id number]  Ms Algeo is a 80 y/o female with a history of breast cancer, HTN, PVD, hyperlipidemia, CAD, previous tobacco use and chronic heart failure.   Echo report from 12/21/20 reviewed and showed an EF of 20-25% along with mild LVH, normal PA pressure of 28.4 mmHg and mild MR. Echo report from 07/25/20 reviewed and showed an EF of 20-25% along with moderate MR.   RHC/LHC done 07/27/20 showed: Ost LAD to Prox LAD lesion is 80% stenosed.   Ost Cx to Prox Cx lesion is 80% stenosed.   Ost RCA to Prox RCA lesion is 60% stenosed.   Mid RCA lesion is 100% stenosed.   Ost LM to Dist LM lesion is 90% stenosed.   SVG graft was visualized by angiography and is normal in caliber.   SVG graft was visualized by angiography and is normal in caliber.   LIMA graft was visualized by non-selective angiography and is normal in caliber.   The graft exhibits no disease.   The graft exhibits no disease.   The graft exhibits no disease.   1.  Severe underlying left main and three-vessel coronary artery disease with patent grafts including LIMA to LAD, SVG to OM1 and SVG to right PDA/OM. 2.  Left ventricular angiography was not performed.  EF was severely reduced by echo. 3.  Significant peripheral arterial disease with heavy calcified vessels.  There is evidence of moderate to severe disease affecting the right common femoral artery. 4.  Right heart catheterization showed moderately elevated filling pressures, mild pulmonary hypertension and mildly reduced cardiac output.  Admitted 11/13/20 for left lower lobe lobectomy and lymph node retrieval. Chest tube placed due to moderate pneumothorax. Removed on day 8. Discharged after 9 days. Admitted 07/24/20 due to HF exacerbation. Initially given IV lasix with transition to oral diuretics. Cardiology and ID consults obtained. Covid +. Cath completed. Discharged after 4 days.   She presents today  for follow up visit with a chief complaint of minimal fatigue upon moderate exertion. She describes this as chronic in nature having been present for several years. She has associated shortness of breath and chronic difficulty sleeping along with this. She denies any dizziness, abdominal distention, palpitations, pedal edema, chest pain, cough or weight gain.   Having POC placed for chemo starting next week. Had radiation in the past for her breast cancer.   Past Medical History:  Diagnosis Date   Arthritis    Breast cancer ([place]) 2011   radiation- Left   CHF (congestive heart failure) ([facility])    Coronary artery disease    Dyspnea    Hyperlipidemia    Hypertension    Myocardial infarction [facility])    Personal history of radiation therapy    PVD (peripheral vascular disease) ([place])    Past Surgical History:  Procedure Laterality Date   ABDOMINAL HYSTERECTOMY     APPENDECTOMY     BREAST BIOPSY Left 12/21/2009   positive, radiation dcis   BREAST EXCISIONAL BIOPSY Left 01/17/2010   lumpectomy DCIS   BREAST LUMPECTOMY     CATARACT EXTRACTION W/ INTRAOCULAR LENS IMPLANT & ANTERIOR VITRECTOMY, BILATERAL     CHOLECYSTECTOMY     COLONOSCOPY WITH PROPOFOL N/A 09/25/2017   Procedure: COLONOSCOPY WITH PROPOFOL;  Surgeon: Lollie Sails, MD;  Location: [facility] ENDOSCOPY;  Service: Endoscopy;  Laterality: N/A;   CORONARY ARTERY BYPASS GRAFT N/A 05/03/2020   Procedure: CORONARY  ARTERY BYPASS GRAFTING (CABG) X FOUR ON PUMP USING LEFT INTERNAL MAMMARY ARTERY AND RIGHT ENDOSCOPIC SAPHEOUS VEIN HARVEST CONDUITS;  Surgeon: Melrose Nakayama, MD;  Location: [place];  Service: Open Heart Surgery;  Laterality: N/A;   DILATION AND CURETTAGE OF UTERUS     EYE SURGERY     bilateral cataracts   INTERCOSTAL NERVE BLOCK Left 11/13/2020   Procedure: INTERCOSTAL NERVE BLOCK;  Surgeon: Melrose Nakayama, MD;  Location: [place];  Service: Thoracic;  Laterality: Left;   JOINT REPLACEMENT Right 01/24/2016   KNEE  ARTHROPLASTY Right 01/24/2016   Procedure: COMPUTER ASSISTED TOTAL KNEE ARTHROPLASTY;  Surgeon: Dereck Leep, MD;  Location: [facility] ORS;  Service: Orthopedics;  Laterality: Right;   LEFT HEART CATH AND CORONARY ANGIOGRAPHY N/A 05/01/2020   Procedure: LEFT HEART CATH AND CORONARY ANGIOGRAPHY;  Surgeon: Wellington Hampshire, MD;  Location: [place] CV LAB;  Service: Cardiovascular;  Laterality: N/A;   LUNG LOBECTOMY  10/2020   LYMPH NODE DISSECTION Left 11/13/2020   Procedure: LYMPH NODE DISSECTION;  Surgeon: Melrose Nakayama, MD;  Location: [facility] OR;  Service: Thoracic;  Laterality: Left;   RIGHT/LEFT HEART CATH AND CORONARY/GRAFT ANGIOGRAPHY N/A 07/27/2020   Procedure: RIGHT/LEFT HEART CATH AND CORONARY/GRAFT ANGIOGRAPHY;  Surgeon: Wellington Hampshire, MD;  Location: [place] CV LAB;  Service: Cardiovascular;  Laterality: N/A;   stent in Lt leg Left    TEE WITHOUT CARDIOVERSION N/A 05/03/2020   Procedure: TRANSESOPHAGEAL ECHOCARDIOGRAM (TEE);  Surgeon: Melrose Nakayama, MD;  Location: [place];  Service: Open Heart Surgery;  Laterality: N/A;   VIDEO BRONCHOSCOPY WITH ENDOBRONCHIAL NAVIGATION N/A 09/25/2020   Procedure: VIDEO BRONCHOSCOPY WITH ENDOBRONCHIAL NAVIGATION;  Surgeon: Melrose Nakayama, MD;  Location: [facility] OR;  Service: Thoracic;  Laterality: N/A;   Family History  Problem Relation Age of Onset   Hypertension Mother    Arthritis Mother    Gout Mother    Lung cancer Father    Heart attack Sister    Social History   Tobacco Use   Smoking status: Former   Smokeless tobacco: Never  Substance Use Topics   Alcohol use: No    Alcohol/week: 0.0 standard drinks   Allergies  Allergen Reactions   Ibuprofen Hives   Penicillins Hives    Tolerated Ancef in the past   Aleve [Naproxen Sodium] Hives   Avapro  [Irbesartan] Rash   Azithromycin Hives   Nickel Itching    Redness, itching and fluid discharge with nickel earrings   Prior to Admission medications   Medication  Sig Start Date End Date Taking? Authorizing Provider  acetaminophen (TYLENOL) 500 MG tablet Take 1,000 mg by mouth every 6 (six) hours as needed for moderate pain or headache.   Yes [provider]  aspirin EC 81 MG tablet Take 81 mg by mouth daily.   Yes [provider]  atorvastatin (LIPITOR) 20 MG tablet Take 1 tablet (20 mg total) by mouth at bedtime. 06/22/20  Yes Visser, Jacquelyn D, PA-C  Calcium Carbonate-Vit D-Min (CALCIUM 600+D3 PLUS MINERALS) 600-800 MG-UNIT TABS Take 1 tablet by mouth daily.   Yes [provider]  chlorpheniramine-HYDROcodone (TUSSIONEX) 10-8 MG/5ML SUER Take 5 mLs by mouth every 12 (twelve) hours as needed for cough. 11/07/20  Yes [provider]  clopidogrel (PLAVIX) 75 MG tablet Take 1 tablet (75 mg total) by mouth daily. 05/22/20 05/17/21 Yes Visser, Jacquelyn D, PA-C  diphenhydrAMINE-zinc acetate (BENADRYL) cream Apply 1 application topically daily as needed for itching.  Yes [provider]  furosemide (LASIX) 40 MG tablet Take 1 tablet (40 mg total) by mouth daily. 07/29/20  Yes Dahal, Marlowe Aschoff, MD  gabapentin (NEURONTIN) 100 MG capsule Take 1 capsule (100 mg total) by mouth 3 (three) times daily. 12/12/20  Yes Melrose Nakayama, MD  metoprolol succinate (TOPROL-XL) 50 MG 24 hr tablet Take 1 tablet (50 mg total) by mouth daily. Take with or immediately following a meal. Patient taking differently: Take 50 mg by mouth 2 (two) times daily. Take with or immediately following a meal. 07/28/20  Yes Dahal, Marlowe Aschoff, MD  nitroGLYCERIN (NITROSTAT) 0.4 MG SL tablet Place 1 tablet (0.4 mg total) under the tongue every 5 (five) minutes as needed for up to 10 days for chest pain. 07/28/20  Yes Dahal, Marlowe Aschoff, MD  oxyCODONE (OXY IR/ROXICODONE) 5 MG immediate release tablet Take 1 tablet (5 mg total) by mouth 3 (three) times daily as needed for severe pain. 12/12/20  Yes Melrose Nakayama, MD  potassium chloride SA (KLOR-CON) 10 MEQ tablet  Take 1 tablet (10 mEq total) by mouth daily. 07/28/20  Yes Dahal, Marlowe Aschoff, MD  sacubitril-valsartan (ENTRESTO) 24-26 MG Take 1 tablet by mouth 2 (two) times daily. 07/28/20  Yes Dahal, Marlowe Aschoff, MD  Multiple Vitamin (MULTIVITAMIN WITH MINERALS) TABS tablet Take 1 tablet by mouth daily. Patient not taking: Reported on 01/11/2021    [provider]  ondansetron (ZOFRAN) 8 MG tablet Take 1 tablet (8 mg total) by mouth 2 (two) times daily as needed (Nausea or vomiting). Patient not taking: Reported on 01/02/2021 12/21/20   Lloyd Huger, MD  prochlorperazine (COMPAZINE) 10 MG tablet Take 1 tablet (10 mg total) by mouth every 6 (six) hours as needed (Nausea or vomiting). Patient not taking: Reported on 01/11/2021 12/21/20   Lloyd Huger, MD   Review of Systems  Constitutional:  Positive for fatigue (minimal). Negative for appetite change.  HENT:  Negative for congestion, postnasal drip and sore throat.   Eyes: Negative.   Respiratory:  Positive for shortness of breath (with little exertion at times). Negative for cough and chest tightness.   Cardiovascular:  Negative for chest pain, palpitations and leg swelling.  Gastrointestinal:  Negative for abdominal distention and abdominal pain.  Endocrine: Negative.   Genitourinary: Negative.   Musculoskeletal:  Negative for back pain and neck pain.  Skin: Negative.   Allergic/Immunologic: Negative.   Neurological:  Negative for dizziness and light-headedness.  Hematological:  Negative for adenopathy. Bruises/bleeds easily.  Psychiatric/Behavioral:  Positive for sleep disturbance (difficulty sleeping; sleeping in recliner due to comfort; chronic). Negative for dysphoric mood. The patient is not nervous/anxious.    Vitals:   01/11/21 1005  BP: (!) 136/59  Pulse: 84  Resp: 18  SpO2: 99%  Weight: 112 lb 8 oz (51 kg)  Height: 5' (1.524 m)   Wt Readings from Last 3 Encounters:  01/11/21 112 lb 8 oz (51 kg)  01/02/21 109 lb (49.4 kg)   12/21/20 108 lb (49 kg)   Lab Results  Component Value Date   CREATININE 0.68 11/18/2020   CREATININE 0.82 11/16/2020   CREATININE 0.81 11/15/2020   Physical Exam Vitals and nursing note reviewed. Exam conducted with a chaperone present (son).  Constitutional:      Appearance: Normal appearance.  HENT:     Head: Normocephalic and atraumatic.  Neck:     Vascular: No carotid bruit.     Comments: - JVD Cardiovascular:     Rate and Rhythm: Normal rate  and regular rhythm.     Pulses: Normal pulses.     Heart sounds: Normal heart sounds. No murmur heard.   No gallop.  Pulmonary:     Effort: Pulmonary effort is normal. No respiratory distress.     Breath sounds: No wheezing or rales.  Abdominal:     General: There is no distension.     Palpations: Abdomen is soft.  Musculoskeletal:        General: No tenderness.     Cervical back: Normal range of motion and neck supple.     Right lower leg: No edema.     Left lower leg: No edema.  Skin:    General: Skin is warm and dry.  Neurological:     General: No focal deficit present.     Mental Status: She is alert and oriented to person, place, and time.  Psychiatric:        Mood and Affect: Mood normal.        Behavior: Behavior normal.        Thought Content: Thought content normal.        Judgment: Judgment normal.     Assessment & Plan:  1: Chronic heart failure with reduced ejection fraction- - NYHA class II - euvolemic today - weighing daily; reminded to call for an overnight weight gain of > 2 pounds or a weekly weight gain of > 5 pounds - weight down 5 pounds from last visit here 3 months ago - not adding salt and trying to be mindful of sodium content of food - on GDMT of metoprolol and entresto - discussed adding SGLT2 & rational for this but she would prefer to wait until she finishes with her chemo - saw cardiology Mickle Plumb) 10/06/20; returns 02/09/21 - BNP 07/24/20 was 745.6  2: HTN- - BP looks good (136/59) -  saw PCP Baldemar Lenis) 01/01/21 - BMP 11/18/20 reviewed and showed sodium 134, potassium 4.0, creatinine 0.68 and GFR > 60  3: CAD- - CABG 04/2020 - saw CVTS Roxan Hockey) on 01/02/21; returns 04/03/21  4: Lung Cancer (stage IIIb)- - had left lower lobectomy done in October 2022 - had video visit with oncology Grayland Ormond) 01/10/21; having port placed tomorrow to begin chemo next week (every 3 weeks for 4 cycles)   Patient brought her medication list and each medication was verbally reviewed with the patient.  Return in 4 months or sooner for any questions/problems before then. Plan to start either farxiga or jardiance at that time.

## 2021-01-09 NOTE — Progress Notes (Signed)
Pharmacist Chemotherapy Monitoring - Initial Assessment    Anticipated start date: 01/17/21   The following has been reviewed per standard work regarding the patient's treatment regimen: The patient's diagnosis, treatment plan and drug doses, and organ/hematologic function Lab orders and baseline tests specific to treatment regimen  The treatment plan start date, drug sequencing, and pre-medications Prior authorization status  Patient's documented medication list, including drug-drug interaction screen and prescriptions for anti-emetics and supportive care specific to the treatment regimen The drug concentrations, fluid compatibility, administration routes, and timing of the medications to be used The patient's access for treatment and lifetime cumulative dose history, if applicable  The patient's medication allergies and previous infusion related reactions, if applicable   Changes made to treatment plan:  N/A  Follow up needed:  [place], [place], 01/09/2021  1:28 PM

## 2021-01-10 ENCOUNTER — Inpatient Hospital Stay (HOSPITAL_BASED_OUTPATIENT_CLINIC_OR_DEPARTMENT_OTHER): Payer: Medicare Other | Admitting: Oncology

## 2021-01-10 DIAGNOSIS — C3432 Malignant neoplasm of lower lobe, left bronchus or lung: Secondary | ICD-10-CM | POA: Diagnosis not present

## 2021-01-10 NOTE — Progress Notes (Signed)
Spoke with grandaughter on phone, confirmed availability for virtual visit as scheduled. Granddaughter has concerns regarding 20-25% ejection fraction results from recent ECHO and chemo effects. Pt has had some nosebleeds. Pt has some shortness of breath after recent lung surgery. Pt has history of tingling in feet but none currently.

## 2021-01-11 ENCOUNTER — Other Ambulatory Visit: Payer: Self-pay

## 2021-01-11 ENCOUNTER — Other Ambulatory Visit: Payer: Self-pay | Admitting: Radiology

## 2021-01-11 ENCOUNTER — Encounter: Payer: Self-pay | Admitting: Family

## 2021-01-11 ENCOUNTER — Ambulatory Visit (HOSPITAL_BASED_OUTPATIENT_CLINIC_OR_DEPARTMENT_OTHER): Payer: Medicare Other | Admitting: Family

## 2021-01-11 VITALS — BP 136/59 | HR 84 | Resp 18 | Ht 60.0 in | Wt 112.5 lb

## 2021-01-11 DIAGNOSIS — Z951 Presence of aortocoronary bypass graft: Secondary | ICD-10-CM

## 2021-01-11 DIAGNOSIS — Z87891 Personal history of nicotine dependence: Secondary | ICD-10-CM | POA: Diagnosis not present

## 2021-01-11 DIAGNOSIS — C3432 Malignant neoplasm of lower lobe, left bronchus or lung: Secondary | ICD-10-CM | POA: Diagnosis present

## 2021-01-11 DIAGNOSIS — I5022 Chronic systolic (congestive) heart failure: Secondary | ICD-10-CM

## 2021-01-11 DIAGNOSIS — Z923 Personal history of irradiation: Secondary | ICD-10-CM | POA: Diagnosis not present

## 2021-01-11 DIAGNOSIS — I739 Peripheral vascular disease, unspecified: Secondary | ICD-10-CM | POA: Diagnosis not present

## 2021-01-11 DIAGNOSIS — I2510 Atherosclerotic heart disease of native coronary artery without angina pectoris: Secondary | ICD-10-CM | POA: Diagnosis not present

## 2021-01-11 DIAGNOSIS — E785 Hyperlipidemia, unspecified: Secondary | ICD-10-CM | POA: Diagnosis not present

## 2021-01-11 DIAGNOSIS — I110 Hypertensive heart disease with heart failure: Secondary | ICD-10-CM | POA: Diagnosis not present

## 2021-01-11 DIAGNOSIS — I252 Old myocardial infarction: Secondary | ICD-10-CM | POA: Diagnosis not present

## 2021-01-11 DIAGNOSIS — I10 Essential (primary) hypertension: Secondary | ICD-10-CM | POA: Diagnosis not present

## 2021-01-11 DIAGNOSIS — I509 Heart failure, unspecified: Secondary | ICD-10-CM | POA: Diagnosis not present

## 2021-01-11 DIAGNOSIS — Z853 Personal history of malignant neoplasm of breast: Secondary | ICD-10-CM | POA: Diagnosis not present

## 2021-01-11 NOTE — Patient Instructions (Addendum)
Resume weighing daily and call for an overnight weight gain of 3 pounds or more or a weekly weight gain of more than 5 pounds.

## 2021-01-11 NOTE — Progress Notes (Signed)
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  INTERVAL HISTORY: Patient returns to clinic today for further evaluation and consideration of cycle 1 of adjuvant cisplatin and Taxotere.  She continues to have mild surgical site tenderness, but otherwise feels well.  She has no neurologic complaints.  She denies any recent fevers.  She has a fair appetite, but denies weight loss.  She has no chest pain, shortness of breath, cough, or hemoptysis.  She denies any nausea, vomiting, constipation, or diarrhea.  She has no urinary complaints.  Patient offers no further specific complaints today.  REVIEW OF SYSTEMS:   Review of Systems  Constitutional: Negative.  Negative for fever, malaise/fatigue and weight loss.  Respiratory:  Negative for cough, hemoptysis and shortness of breath.   Cardiovascular: Negative.  Negative for chest pain and leg swelling.  Gastrointestinal: Negative.  Negative for abdominal pain.  Genitourinary: Negative.  Negative for dysuria.  Musculoskeletal: Negative.  Negative for back pain.  Skin: Negative.  Negative for rash.  Neurological: Negative.  Negative for dizziness, focal weakness, weakness and headaches.  Psychiatric/Behavioral:  The patient is nervous/anxious.    As per HPI. Otherwise, a complete review of systems is negative.  PAST MEDICAL HISTORY: Past Medical History:  Diagnosis Date   Arthritis    Breast cancer ([place]) 2011   radiation- Left   CHF (congestive heart failure) (HCC)    Coronary artery disease    Dyspnea    Hyperlipidemia    Hypertension    Myocardial infarction  [facility])    Personal history of radiation therapy    PVD (peripheral vascular disease) ([place])     PAST SURGICAL HISTORY: Past Surgical History:  Procedure Laterality Date   ABDOMINAL HYSTERECTOMY     APPENDECTOMY     BREAST BIOPSY Left 12/21/2009   positive, radiation dcis   BREAST EXCISIONAL BIOPSY Left 01/17/2010   lumpectomy DCIS   BREAST LUMPECTOMY     CATARACT EXTRACTION W/ INTRAOCULAR LENS IMPLANT & ANTERIOR VITRECTOMY, BILATERAL     CHOLECYSTECTOMY     COLONOSCOPY WITH PROPOFOL N/A 09/25/2017   Procedure: COLONOSCOPY WITH PROPOFOL;  Surgeon: Lollie Sails, MD;  Location[facility] ENDOSCOPY;  Service: Endoscopy;  Laterality: N/A;   CORONARY ARTERY BYPASS GRAFT N/A 05/03/2020   Procedure: CORONARY ARTERY BYPASS GRAFTING (CABG) X FOUR ON PUMP USING LEFT INTERNAL MAMMARY ARTERY AND RIGHT ENDOSCOPIC SAPHEOUS VEIN HARVEST CONDUITS;  Surgeon: Melrose Nakayama, MD;  Location: [place];  Service: Open Heart Surgery;  Laterality: N/A;   DILATION AND CURETTAGE OF UTERUS     EYE SURGERY     bilateral cataracts   INTERCOSTAL NERVE BLOCK Left 11/13/2020   Procedure: INTERCOSTAL NERVE BLOCK;  Surgeon: Melrose Nakayama, MD;  Location: [place];  Service: Thoracic;  Laterality: Left;   IR IMAGING GUIDED PORT INSERTION  01/12/2021   JOINT REPLACEMENT Right 01/24/2016   KNEE ARTHROPLASTY Right 01/24/2016   Procedure: COMPUTER ASSISTED TOTAL KNEE ARTHROPLASTY;  Surgeon: Dereck Leep, MD;  Location: [facility] ORS;  Service: Orthopedics;  Laterality: Right;   LEFT HEART CATH AND CORONARY ANGIOGRAPHY N/A 05/01/2020   Procedure: LEFT HEART CATH AND CORONARY ANGIOGRAPHY;  Surgeon:  Wellington Hampshire, MD;  Location: [place] CV LAB;  Service: Cardiovascular;  Laterality: N/A;   LUNG LOBECTOMY  10/2020   LYMPH NODE DISSECTION Left 11/13/2020   Procedure: LYMPH NODE DISSECTION;  Surgeon: Melrose Nakayama, MD;  Location: [facility] OR;  Service: Thoracic;  Laterality: Left;   RIGHT/LEFT HEART CATH AND  CORONARY/GRAFT ANGIOGRAPHY N/A 07/27/2020   Procedure: RIGHT/LEFT HEART CATH AND CORONARY/GRAFT ANGIOGRAPHY;  Surgeon: Wellington Hampshire, MD;  Location: [place] CV LAB;  Service: Cardiovascular;  Laterality: N/A;   stent in Lt leg Left    TEE WITHOUT CARDIOVERSION N/A 05/03/2020   Procedure: TRANSESOPHAGEAL ECHOCARDIOGRAM (TEE);  Surgeon: Melrose Nakayama, MD;  Location: [place];  Service: Open Heart Surgery;  Laterality: N/A;   VIDEO BRONCHOSCOPY WITH ENDOBRONCHIAL NAVIGATION N/A 09/25/2020   Procedure: VIDEO BRONCHOSCOPY WITH ENDOBRONCHIAL NAVIGATION;  Surgeon: Melrose Nakayama, MD;  Location: [facility] OR;  Service: Thoracic;  Laterality: N/A;    FAMILY HISTORY: Family History  Problem Relation Age of Onset   Hypertension Mother    Arthritis Mother    Gout Mother    Lung cancer Father    Heart attack Sister     ADVANCED DIRECTIVES (Y/N):  N  HEALTH MAINTENANCE: Social History   Tobacco Use   Smoking status: Former   Smokeless tobacco: Never  Scientific laboratory technician Use: Never used  Substance Use Topics   Alcohol use: No    Alcohol/week: 0.0 standard drinks   Drug use: No     Colonoscopy:  PAP:  Bone density:  Lipid panel:  Allergies  Allergen Reactions   Ibuprofen Hives   Penicillins Hives    Tolerated Ancef in the past   Aleve [Naproxen Sodium] Hives   Avapro  [Irbesartan] Rash   Azithromycin Hives   Nickel Itching    Redness, itching and fluid discharge with nickel earrings    Current Outpatient Medications  Medication Sig Dispense Refill   acetaminophen (TYLENOL) 500 MG tablet Take 1,000 mg by mouth every 6 (six) hours as needed for moderate pain or headache.     aspirin EC 81 MG tablet Take 81 mg by mouth daily.     atorvastatin (LIPITOR) 20 MG tablet Take 1 tablet (20 mg total) by mouth at bedtime. 90 tablet 3   Calcium Carbonate-Vit D-Min (CALCIUM 600+D3 PLUS MINERALS) 600-800 MG-UNIT TABS Take 1 tablet by mouth daily.      chlorpheniramine-HYDROcodone (TUSSIONEX) 10-8 MG/5ML SUER Take 5 mLs by mouth every 12 (twelve) hours as needed for cough.     clopidogrel (PLAVIX) 75 MG tablet Take 1 tablet (75 mg total) by mouth daily. 90 tablet 3   diphenhydrAMINE-zinc acetate (BENADRYL) cream Apply 1 application topically daily as needed for itching.     furosemide (LASIX) 40 MG tablet Take 1 tablet (40 mg total) by mouth daily. 30 tablet 0   gabapentin (NEURONTIN) 100 MG capsule Take 1 capsule (100 mg total) by mouth 3 (three) times daily. 90 capsule 3   lidocaine-prilocaine (EMLA) cream Apply 1 application topically as needed. Apply to port and cover with saran wrap 1-2 hours prior to port access 30 g 1   Multiple Vitamin (MULTIVITAMIN WITH MINERALS) TABS tablet Take 1 tablet by mouth daily.     nitroGLYCERIN (NITROSTAT) 0.4 MG SL tablet Place 1 tablet (0.4 mg total) under the tongue every 5 (five) minutes as needed for up to 10 days for chest pain. 10 tablet 0   ondansetron (ZOFRAN) 8 MG tablet  Take 1 tablet (8 mg total) by mouth 2 (two) times daily as needed (Nausea or vomiting). 60 tablet 1   oxyCODONE (OXY IR/ROXICODONE) 5 MG immediate release tablet Take 1 tablet (5 mg total) by mouth 3 (three) times daily as needed for severe pain. 15 tablet 0   potassium chloride SA (KLOR-CON) 10 MEQ tablet Take 1 tablet (10 mEq total) by mouth daily. 30 tablet 0   prochlorperazine (COMPAZINE) 10 MG tablet Take 1 tablet (10 mg total) by mouth every 6 (six) hours as needed (Nausea or vomiting). 60 tablet 1   sacubitril-valsartan (ENTRESTO) 24-26 MG Take 1 tablet by mouth 2 (two) times daily. 60 tablet 0   metoprolol succinate (TOPROL-XL) 50 MG 24 hr tablet Take 1 tablet (50 mg total) by mouth daily. Take with or immediately following a meal. 90 tablet 0   No current facility-administered medications for this visit.    OBJECTIVE: Vitals:   01/17/21 0839  BP: (!) 115/52  Pulse: 84  Resp: 16  Temp: (!) 97.2 F (36.2 C)  SpO2: 98%       Body mass index is 21.91 kg/m.    ECOG FS:0 - Asymptomatic  General: Well-developed, well-nourished, no acute distress. Eyes: Pink conjunctiva, anicteric sclera. HEENT: Normocephalic, moist mucous membranes. Lungs: No audible wheezing or coughing. Heart: Regular rate and rhythm. Abdomen: Soft, nontender, no obvious distention. Musculoskeletal: No edema, cyanosis, or clubbing. Neuro: Alert, answering all questions appropriately. Cranial nerves grossly intact. Skin: No rashes or petechiae noted. Psych: Normal affect.   LAB RESULTS:  Lab Results  Component Value Date   NA 134 (L) 01/17/2021   K 3.0 (L) 01/17/2021   CL 101 01/17/2021   CO2 25 01/17/2021   GLUCOSE 140 (H) 01/17/2021   BUN 11 01/17/2021   CREATININE 0.86 01/17/2021   CALCIUM 8.7 (L) 01/17/2021   PROT 6.9 01/17/2021   ALBUMIN 3.4 (L) 01/17/2021   AST 24 01/17/2021   ALT 11 01/17/2021   ALKPHOS 88 01/17/2021   BILITOT 0.9 01/17/2021   GFRNONAA >60 01/17/2021   GFRAA >60 01/26/2016    Lab Results  Component Value Date   WBC 4.3 01/17/2021   NEUTROABS 2.7 01/17/2021   HGB 10.8 (L) 01/17/2021   HCT 32.3 (L) 01/17/2021   MCV 96.1 01/17/2021   PLT 180 01/17/2021     STUDIES: DG Chest 2 View  Result Date: 01/02/2021 CLINICAL DATA:  Chest pain EXAM: CHEST - 2 VIEW COMPARISON:  Chest x-ray 12/12/2020 FINDINGS: Cardiomediastinal silhouette is unchanged. Mild cardiomegaly. Calcified plaques in the aortic arch. Cardiac surgical changes and median sternotomy wires. Surgical changes in the posterior left chest from previous mass resection. There is persistent and stable small left pleural effusion with associated atelectasis at the left lung base. No pneumothorax. IMPRESSION: 1. Stable small left pleural effusion with associated atelectasis. 2. Cardiomegaly and postsurgical changes. Electronically Signed   By: Ofilia Neas M.D.   On: 01/02/2021 09:16   ECHOCARDIOGRAM COMPLETE  Result Date: 12/22/2020     ECHOCARDIOGRAM REPORT   Patient Name:   [name] Date of Exam: 12/21/2020 Medical Rec #:  [id number]       Height:       60.0 in Accession #:    [id number]      Weight:       110.0 lb Date of Birth:  1940-04-17       BSA:          1.448 m Patient Age:  80 years        BP:           140/68 mmHg Patient Gender: F               HR:           80 bpm. Exam Location:  [place] Procedure: 2D Echo, Color Doppler, Cardiac Doppler and Intracardiac            Opacification Agent Indications:    I50.23 Acute on chronic systolic (congestive) heart failure  History:        Patient has prior history of Echocardiogram examinations, most                 recent 07/25/2020. CAD and Previous Myocardial Infarction, Prior                 CABG; Risk Factors:Hypertension, Dyslipidemia and Former Smoker.  Sonographer:    Caesar Chestnut RDCS, RVT Sonographer#2:  Wilkie Aye RVT Referring Phys: [id number] [place]  1. Left ventricular ejection fraction, by estimation, is 20 to 25%. The left ventricle has severely decreased function. The left ventricle demonstrates global hypokinesis , inferior/posterior/lateral wall motion best preserved. The left ventricular internal cavity size was moderately dilated. There is mild left ventricular hypertrophy. Left ventricular diastolic parameters are indeterminate.  2. Right ventricular systolic function is normal. The right ventricular size is normal. There is normal pulmonary artery systolic pressure. The estimated right ventricular systolic pressure is 48.1 mmHg.  3. The mitral valve is normal in structure. Mild mitral valve regurgitation. No evidence of mitral stenosis.  4. The aortic valve is normal in structure. Aortic valve regurgitation is not visualized. No aortic stenosis is present.  5. The inferior vena cava is normal in size with greater than 50% respiratory variability, suggesting right atrial pressure of 3 mmHg. Comparison(s): LVEF 20-25%. FINDINGS  Left Ventricle:  Left ventricular ejection fraction, by estimation, is 20 to 25%. The left ventricle has severely decreased function. The left ventricle demonstrates global hypokinesis. Definity contrast agent was given IV to delineate the left ventricular endocardial borders. The left ventricular internal cavity size was moderately dilated. There is mild left ventricular hypertrophy. Left ventricular diastolic parameters are indeterminate. Right Ventricle: The right ventricular size is normal. No increase in right ventricular wall thickness. Right ventricular systolic function is normal. There is normal pulmonary artery systolic pressure. The tricuspid regurgitant velocity is 2.42 m/s, and  with an assumed right atrial pressure of 5 mmHg, the estimated right ventricular systolic pressure is 85.6 mmHg. Left Atrium: Left atrial size was normal in size. Right Atrium: Right atrial size was normal in size. Pericardium: There is no evidence of pericardial effusion. Mitral Valve: The mitral valve is normal in structure. Mild mitral valve regurgitation. No evidence of mitral valve stenosis. Tricuspid Valve: The tricuspid valve is normal in structure. Tricuspid valve regurgitation is mild . No evidence of tricuspid stenosis. Aortic Valve: The aortic valve is normal in structure. Aortic valve regurgitation is not visualized. No aortic stenosis is present. Aortic valve mean gradient measures 2.0 mmHg. Aortic valve peak gradient measures 3.3 mmHg. Aortic valve area, by VTI measures 2.24 cm. Pulmonic Valve: The pulmonic valve was normal in structure. Pulmonic valve regurgitation is not visualized. No evidence of pulmonic stenosis. Aorta: The aortic root is normal in size and structure. Venous: The inferior vena cava is normal in size with greater than 50% respiratory variability, suggesting right atrial pressure of 3 mmHg. IAS/Shunts: No atrial  level shunt detected by color flow Doppler.  LEFT VENTRICLE PLAX 2D LVIDd:         4.55 cm       Diastology LVIDs:         3.95 cm      LV e' medial:  4.03 cm/s LV PW:         1.05 cm      LV e' lateral: 7.83 cm/s LV IVS:        1.10 cm LVOT diam:     2.00 cm LV SV:         40 LV SV Index:   28 LVOT Area:     3.14 cm  LV Volumes (MOD) LV vol d, MOD A4C: 106.0 ml LV vol s, MOD A4C: 63.8 ml LV SV MOD A4C:     106.0 ml RIGHT VENTRICLE            IVC RV Basal diam:  3.70 cm    IVC diam: 1.20 cm RV S prime:     9.90 cm/s TAPSE (M-mode): 1.9 cm LEFT ATRIUM         Index       RIGHT ATRIUM           Index LA diam:    3.10 cm 2.14 cm/m  RA Area:     13.60 cm                                 RA Volume:   31.80 ml  21.96 ml/m  AORTIC VALVE                    PULMONIC VALVE AV Area (Vmax):    2.13 cm     PV Vmax:       0.70 m/s AV Area (Vmean):   1.99 cm     PV Peak grad:  2.0 mmHg AV Area (VTI):     2.24 cm AV Vmax:           90.40 cm/s AV Vmean:          61.900 cm/s AV VTI:            0.178 m AV Peak Grad:      3.3 mmHg AV Mean Grad:      2.0 mmHg LVOT Vmax:         61.30 cm/s LVOT Vmean:        39.300 cm/s LVOT VTI:          0.127 m LVOT/AV VTI ratio: 0.71  AORTA Ao Root diam: 3.20 cm Ao Asc diam:  3.20 cm TRICUSPID VALVE TR Peak grad:   23.4 mmHg TR Vmax:        242.00 cm/s  SHUNTS Systemic VTI:  0.13 m Systemic Diam: 2.00 cm Ida Rogue MD Electronically signed by Ida Rogue MD Signature Date/Time: 12/22/2020/6:59:10 AM    Final    IR IMAGING GUIDED PORT INSERTION  Result Date: 01/12/2021 INDICATION: Chemotherapy, lung cancer EXAM: 1. Ultrasound-guided puncture of the right internal jugular vein 2. Placement of a right-sided chest port using fluoroscopic guidance MEDICATIONS: None ANESTHESIA/SEDATION: Moderate (conscious) sedation was employed during this procedure. A total of Versed 2 mg and Fentanyl 100 mcg was administered intravenously. Moderate Sedation Time: 20 minutes. The patient's level of consciousness and vital signs were monitored continuously by radiology nursing throughout the procedure  under my direct supervision. FLUOROSCOPY TIME:  Fluoroscopy Time: 0.3 minutes with 3 exposures COMPLICATIONS: None immediate. PROCEDURE: Informed written consent was obtained from the patient after a thorough discussion of the procedural risks, benefits and alternatives. All questions were addressed. Maximal Sterile Barrier Technique was utilized including caps, mask, sterile gowns, sterile gloves, sterile drape, hand hygiene and skin antiseptic. A timeout was performed prior to the initiation of the procedure. The patient was placed supine on the exam table. The right neck and chest was prepped and draped in the standard sterile fashion. A preliminary ultrasound of the right neck was performed and demonstrates a patent right internal jugular vein. A permanent ultrasound image was stored in the electronic medical record. The overlying skin was anesthetized with 1% Lidocaine. Using ultrasound guidance, access was obtained into the right internal jugular vein using a 21 gauge micropuncture set. A wire was advanced into the SVC, a short incision was made at the puncture site, and serial dilatation performed. Next, in an ipsilateral infraclavicular location, an incision was made at the site of the subcutaneous reservoir. Blunt dissection was used to open a pocket to contain the reservoir. A subcutaneous tunnel was then created from the port site to the puncture site. A(n) 8 Fr single lumen catheter was advanced through the tunnel. The catheter was attached to the port and this was placed in the subcutaneous pocket. Under fluoroscopic guidance, a peel away sheath was placed, and the catheter was trimmed to the appropriate length and was advanced into the central veins. The catheter length is 22 cm. The tip of the catheter lies near the superior cavoatrial junction. The port flushes and aspirates appropriately. The port was flushed and locked with heparinized saline. The port pocket was closed in 2 layers using 3-0 and  4-0 Vicryl/absorbable suture. Dermabond was also applied to both incisions. The patient tolerated the procedure well and was transferred to recovery in stable condition. IMPRESSION: Successful placement of a right chest port via the right internal jugular vein. The port is ready for immediate use. Electronically Signed   By: Albin Felling M.D.   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  MRI of the brain on October 19, 2020 was negative for disease.  Given her stage, patient will benefit from adjuvant chemotherapy using cisplatin and Taxotere every 3 weeks for 4 cycles with Udenyca support.  Patient has had port placement.  Proceed with treatment today.  Return to clinic in 1 week for laboratory work and further evaluation and then in 3 weeks for laboratory work, further evaluation, and consideration of cycle 2 of 4.    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  Continue yearly screening mammograms. Cardiac disease: Continue follow-up with cardiology as scheduled. Anemia: Mild.  Patient's hemoglobin is 10.8 today.  Monitor. Hypokalemia: Patient was given dietary changes.  She will also receive IV potassium along with her cisplatin. Hypomagnesia: Patient will receive 2 g IV magnesium along with cisplatin today.   Patient expressed understanding and was in agreement with this plan. She also understands that She can call clinic at any time with any questions, concerns, or complaints.    Cancer Staging  Primary squamous cell carcinoma of lower lobe of left lung ([place]) Staging form: Lung, AJCC 8th Edition -  Pathologic stage from 12/21/2020: Stage IIIB (ypT3, pN2, cM0) - Signed by Lloyd Huger, MD on 12/21/2020 Stage prefix:  Post-therapy   Lloyd Huger, MD   01/18/2021 6:51 AM

## 2021-01-12 ENCOUNTER — Other Ambulatory Visit: Payer: Self-pay

## 2021-01-12 ENCOUNTER — Ambulatory Visit
Admission: RE | Admit: 2021-01-12 | Discharge: 2021-01-12 | Disposition: A | Discharge status: Home / Self Care | Payer: Medicare Other | Source: Ambulatory Visit | Attending: Oncology | Admitting: Oncology

## 2021-01-12 DIAGNOSIS — Z923 Personal history of irradiation: Secondary | ICD-10-CM | POA: Insufficient documentation

## 2021-01-12 DIAGNOSIS — C3490 Malignant neoplasm of unspecified part of unspecified bronchus or lung: Secondary | ICD-10-CM

## 2021-01-12 DIAGNOSIS — Z87891 Personal history of nicotine dependence: Secondary | ICD-10-CM | POA: Insufficient documentation

## 2021-01-12 DIAGNOSIS — I110 Hypertensive heart disease with heart failure: Secondary | ICD-10-CM | POA: Insufficient documentation

## 2021-01-12 DIAGNOSIS — I509 Heart failure, unspecified: Secondary | ICD-10-CM | POA: Insufficient documentation

## 2021-01-12 DIAGNOSIS — C3432 Malignant neoplasm of lower lobe, left bronchus or lung: Secondary | ICD-10-CM | POA: Diagnosis not present

## 2021-01-12 DIAGNOSIS — E785 Hyperlipidemia, unspecified: Secondary | ICD-10-CM | POA: Insufficient documentation

## 2021-01-12 DIAGNOSIS — Z853 Personal history of malignant neoplasm of breast: Secondary | ICD-10-CM | POA: Insufficient documentation

## 2021-01-12 DIAGNOSIS — I2510 Atherosclerotic heart disease of native coronary artery without angina pectoris: Secondary | ICD-10-CM | POA: Insufficient documentation

## 2021-01-12 DIAGNOSIS — I739 Peripheral vascular disease, unspecified: Secondary | ICD-10-CM | POA: Insufficient documentation

## 2021-01-12 DIAGNOSIS — I252 Old myocardial infarction: Secondary | ICD-10-CM | POA: Insufficient documentation

## 2021-01-12 HISTORY — PX: IR IMAGING GUIDED PORT INSERTION: IMG5740

## 2021-01-12 MED ORDER — HEPARIN SOD (PORK) LOCK FLUSH 100 UNIT/ML IV SOLN
INTRAVENOUS | Status: AC
  Administered 2021-01-12: 10:00:00 500 [IU]
  Filled 2021-01-12: qty 5

## 2021-01-12 MED ORDER — FENTANYL CITRATE (PF) 100 MCG/2ML IJ SOLN
INTRAMUSCULAR | Status: AC | PRN
  Administered 2021-01-12 (×2): 50 ug via INTRAVENOUS

## 2021-01-12 MED ORDER — SODIUM CHLORIDE 0.9 % IV SOLN
INTRAVENOUS | Status: DC
  Filled 2021-01-12: qty 1000

## 2021-01-12 MED ORDER — MIDAZOLAM HCL 2 MG/2ML IJ SOLN
INTRAMUSCULAR | Status: AC | PRN
  Administered 2021-01-12 (×2): 1 mg via INTRAVENOUS

## 2021-01-12 MED ORDER — LIDOCAINE-EPINEPHRINE 1 %-1:100000 IJ SOLN
INTRAMUSCULAR | Status: AC
  Administered 2021-01-12: 10:00:00 10 mL
  Filled 2021-01-12: qty 1

## 2021-01-12 MED ORDER — FENTANYL CITRATE (PF) 100 MCG/2ML IJ SOLN
INTRAMUSCULAR | Status: AC
  Filled 2021-01-12: qty 2

## 2021-01-12 MED ORDER — MIDAZOLAM HCL 2 MG/2ML IJ SOLN
INTRAMUSCULAR | Status: AC
  Filled 2021-01-12: qty 2

## 2021-01-12 NOTE — Progress Notes (Signed)
Patient clinically stable post Port placement per Dr Denna Haggard, tolerated well. Denies complaints presently. Granddaughter updated at bedside. Received Versed 2 mg along with Fentanyl 100 mcg IV for procedure. Report given to [facility] post procedure/specials.

## 2021-01-12 NOTE — Procedures (Signed)
Interventional Radiology Procedure Note  Date of Procedure: 01/12/2021  Procedure: Port placement   Findings:  1. Successful placement of right chest port via right IJ   Complications: No immediate complications noted.   Estimated Blood Loss: minimal  Follow-up and Recommendations: 1. Ready for use    Albin Felling, MD  Vascular & Interventional Radiology  01/12/2021 10:23 AM

## 2021-01-12 NOTE — Consult Note (Signed)
Chief Complaint: Patient was seen in consultation today for Port-A-Cath placement  Referring Physician(s): Finnegan,Timothy J  Supervising Physician: Juliet Rude  Patient Status: [name] - Out-pt  History of Present Illness: [name] is a 80 y.o. 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  She presents today for Port-A-Cath placement to assist with additional treatment.  Past Medical History:  Diagnosis Date   Arthritis    Breast cancer ([place]) 2011   radiation- Left   CHF (congestive heart failure) (HCC)    Coronary artery disease    Dyspnea    Hyperlipidemia    Hypertension    Myocardial infarction [facility])    Personal history of radiation therapy    PVD (peripheral vascular disease) ([place])     Past Surgical History:  Procedure Laterality Date   ABDOMINAL HYSTERECTOMY     APPENDECTOMY     BREAST BIOPSY Left 12/21/2009   positive, radiation dcis   BREAST EXCISIONAL BIOPSY Left 01/17/2010   lumpectomy DCIS   BREAST LUMPECTOMY     CATARACT EXTRACTION W/ INTRAOCULAR LENS IMPLANT & ANTERIOR VITRECTOMY, BILATERAL     CHOLECYSTECTOMY     COLONOSCOPY WITH PROPOFOL N/A 09/25/2017   Procedure: COLONOSCOPY WITH PROPOFOL;  Surgeon: Lollie Sails, MD;  [facility] ENDOSCOPY;  Service: Endoscopy;  Laterality: N/A;   CORONARY ARTERY BYPASS GRAFT N/A 05/03/2020   Procedure: CORONARY ARTERY BYPASS GRAFTING (CABG) X FOUR ON PUMP USING LEFT INTERNAL MAMMARY ARTERY AND RIGHT ENDOSCOPIC SAPHEOUS VEIN HARVEST CONDUITS;  Surgeon: Melrose Nakayama, MD;  Location: [place];  Service: Open Heart Surgery;  Laterality: N/A;   DILATION AND CURETTAGE OF UTERUS     EYE SURGERY     bilateral cataracts    INTERCOSTAL NERVE BLOCK Left 11/13/2020   Procedure: INTERCOSTAL NERVE BLOCK;  Surgeon: Melrose Nakayama, MD;  Location: [place];  Service: Thoracic;  Laterality: Left;   JOINT REPLACEMENT Right 01/24/2016   KNEE ARTHROPLASTY Right 01/24/2016   Procedure: COMPUTER ASSISTED TOTAL KNEE ARTHROPLASTY;  Surgeon: Dereck Leep, MD;  Location: [facility] ORS;  Service: Orthopedics;  Laterality: Right;   LEFT HEART CATH AND CORONARY ANGIOGRAPHY N/A 05/01/2020   Procedure: LEFT HEART CATH AND CORONARY ANGIOGRAPHY;  Surgeon: Wellington Hampshire, MD;  Location: [place] CV LAB;  Service: Cardiovascular;  Laterality: N/A;   LUNG LOBECTOMY  10/2020   LYMPH NODE DISSECTION Left 11/13/2020   Procedure: LYMPH NODE DISSECTION;  Surgeon: Melrose Nakayama, MD;  Location: [facility] OR;  Service: Thoracic;  Laterality: Left;   RIGHT/LEFT HEART CATH AND CORONARY/GRAFT ANGIOGRAPHY N/A 07/27/2020   Procedure: RIGHT/LEFT HEART CATH AND CORONARY/GRAFT ANGIOGRAPHY;  Surgeon: Wellington Hampshire, MD;  Location: [place] CV LAB;  Service: Cardiovascular;  Laterality: N/A;   stent in Lt leg Left    TEE WITHOUT CARDIOVERSION N/A 05/03/2020   Procedure: TRANSESOPHAGEAL ECHOCARDIOGRAM (TEE);  Surgeon: Melrose Nakayama, MD;  Location: [place];  Service: Open Heart Surgery;  Laterality: N/A;   VIDEO BRONCHOSCOPY WITH ENDOBRONCHIAL NAVIGATION N/A 09/25/2020   Procedure: VIDEO BRONCHOSCOPY WITH ENDOBRONCHIAL NAVIGATION;  Surgeon: Melrose Nakayama, MD;  Location: [facility] OR;  Service: Thoracic;  Laterality: N/A;    Allergies: Ibuprofen, Penicillins, Aleve [naproxen sodium], Avapro  [irbesartan], Azithromycin, and Nickel  Medications: Prior to Admission  medications   Medication Sig Start Date End Date Taking? Authorizing Provider  acetaminophen (TYLENOL) 500 MG tablet Take 1,000 mg by mouth every 6 (six) hours as needed for moderate pain or headache.   Yes [provider]  aspirin EC 81 MG tablet Take 81 mg by  mouth daily.   Yes [provider]  atorvastatin (LIPITOR) 20 MG tablet Take 1 tablet (20 mg total) by mouth at bedtime. 06/22/20  Yes Visser, Jacquelyn D, PA-C  Calcium Carbonate-Vit D-Min (CALCIUM 600+D3 PLUS MINERALS) 600-800 MG-UNIT TABS Take 1 tablet by mouth daily.   Yes [provider]  chlorpheniramine-HYDROcodone (TUSSIONEX) 10-8 MG/5ML SUER Take 5 mLs by mouth every 12 (twelve) hours as needed for cough. 11/07/20  Yes [provider]  clopidogrel (PLAVIX) 75 MG tablet Take 1 tablet (75 mg total) by mouth daily. 05/22/20 05/17/21 Yes Visser, Jacquelyn D, PA-C  diphenhydrAMINE-zinc acetate (BENADRYL) cream Apply 1 application topically daily as needed for itching.   Yes [provider]  furosemide (LASIX) 40 MG tablet Take 1 tablet (40 mg total) by mouth daily. 07/29/20  Yes Dahal, Marlowe Aschoff, MD  metoprolol succinate (TOPROL-XL) 50 MG 24 hr tablet Take 1 tablet (50 mg total) by mouth daily. Take with or immediately following a meal. Patient taking differently: Take 50 mg by mouth 2 (two) times daily. Take with or immediately following a meal. 07/28/20  Yes Dahal, Marlowe Aschoff, MD  Multiple Vitamin (MULTIVITAMIN WITH MINERALS) TABS tablet Take 1 tablet by mouth daily.   Yes [provider]  oxyCODONE (OXY IR/ROXICODONE) 5 MG immediate release tablet Take 1 tablet (5 mg total) by mouth 3 (three) times daily as needed for severe pain. 12/12/20  Yes Melrose Nakayama, MD  potassium chloride SA (KLOR-CON) 10 MEQ tablet Take 1 tablet (10 mEq total) by mouth daily. 07/28/20  Yes Dahal, Marlowe Aschoff, MD  sacubitril-valsartan (ENTRESTO) 24-26 MG Take 1 tablet by mouth 2 (two) times daily. 07/28/20  Yes Dahal, Marlowe Aschoff, MD  gabapentin (NEURONTIN) 100 MG capsule Take 1 capsule (100 mg total) by mouth 3 (three) times daily. 12/12/20   Melrose Nakayama, MD  nitroGLYCERIN (NITROSTAT) 0.4 MG SL tablet Place 1 tablet (0.4 mg total) under the tongue every 5 (five) minutes as needed  for up to 10 days for chest pain. 07/28/20   Dahal, Marlowe Aschoff, MD  ondansetron (ZOFRAN) 8 MG tablet Take 1 tablet (8 mg total) by mouth 2 (two) times daily as needed (Nausea or vomiting). Patient not taking: Reported on 01/02/2021 12/21/20   Lloyd Huger, MD  prochlorperazine (COMPAZINE) 10 MG tablet Take 1 tablet (10 mg total) by mouth every 6 (six) hours as needed (Nausea or vomiting). Patient not taking: Reported on 01/11/2021 12/21/20   Lloyd Huger, MD     Family History  Problem Relation Age of Onset   Hypertension Mother    Arthritis Mother    Gout Mother    Lung cancer Father    Heart attack Sister     Social History   Socioeconomic History   Marital status: Widowed    Spouse name: Not on file   Number of children: Not on file   Years of education: Not on file   Highest education level: Not on file  Occupational History   Not on file  Tobacco Use   Smoking status: Former   Smokeless tobacco: Never  Vaping Use   Vaping Use: Never used  Substance and Sexual Activity   Alcohol use: No  Alcohol/week: 0.0 standard drinks   Drug use: No   Sexual activity: Not Currently  Other Topics Concern   Not on file  Social History Narrative   Not on file   Social Determinants of Health   Financial Resource Strain: Not on file  Food Insecurity: Not on file  Transportation Needs: Not on file  Physical Activity: Not on file  Stress: Not on file  Social Connections: Not on file      Review of Systems denies fever, headache, chest pain, cough, abdominal/back pain, nausea, vomiting.  She does have some dyspnea with exertion and some occasional nosebleeds.  Vital Signs:pending   Physical Exam awake, alert.  Chest with slightly diminished breath sounds left base, right clear.  Heart with regular rate and rhythm.  Abdomen soft, positive bowel sounds, nontender.  No lower extremity edema  Imaging: DG Chest 2 View  Result Date: 01/02/2021 CLINICAL DATA:  Chest  pain EXAM: CHEST - 2 VIEW COMPARISON:  Chest x-ray 12/12/2020 FINDINGS: Cardiomediastinal silhouette is unchanged. Mild cardiomegaly. Calcified plaques in the aortic arch. Cardiac surgical changes and median sternotomy wires. Surgical changes in the posterior left chest from previous mass resection. There is persistent and stable small left pleural effusion with associated atelectasis at the left lung base. No pneumothorax. IMPRESSION: 1. Stable small left pleural effusion with associated atelectasis. 2. Cardiomegaly and postsurgical changes. Electronically Signed   By: Ofilia Neas M.D.   On: 01/02/2021 09:16   ECHOCARDIOGRAM COMPLETE  Result Date: 12/22/2020    ECHOCARDIOGRAM REPORT   Patient Name:   [name] Date of Exam: 12/21/2020 Medical Rec #:  [id number]       Height:       60.0 in Accession #:    [id number]      Weight:       110.0 lb Date of Birth:  12-Nov-1940       BSA:          1.448 m Patient Age:    32 years        BP:           140/68 mmHg Patient Gender: F               HR:           80 bpm. Exam Location:   Procedure: 2D Echo, Color Doppler, Cardiac Doppler and Intracardiac            Opacification Agent Indications:    I50.23 Acute on chronic systolic (congestive) heart failure  History:        Patient has prior history of Echocardiogram examinations, most                 recent 07/25/2020. CAD and Previous Myocardial Infarction, Prior                 CABG; Risk Factors:Hypertension, Dyslipidemia and Former Smoker.  Sonographer:    Caesar Chestnut RDCS, RVT Sonographer#2:  Wilkie Aye RVT Referring Phys: [id number] [place]  1. Left ventricular ejection fraction, by estimation, is 20 to 25%. The left ventricle has severely decreased function. The left ventricle demonstrates global hypokinesis , inferior/posterior/lateral wall motion best preserved. The left ventricular internal cavity size was moderately dilated. There is mild left ventricular hypertrophy.  Left ventricular diastolic parameters are indeterminate.  2. Right ventricular systolic function is normal. The right ventricular size is normal. There is normal pulmonary artery systolic pressure. The estimated right ventricular systolic pressure is  28.4 mmHg.  3. The mitral valve is normal in structure. Mild mitral valve regurgitation. No evidence of mitral stenosis.  4. The aortic valve is normal in structure. Aortic valve regurgitation is not visualized. No aortic stenosis is present.  5. The inferior vena cava is normal in size with greater than 50% respiratory variability, suggesting right atrial pressure of 3 mmHg. Comparison(s): LVEF 20-25%. FINDINGS  Left Ventricle: Left ventricular ejection fraction, by estimation, is 20 to 25%. The left ventricle has severely decreased function. The left ventricle demonstrates global hypokinesis. Definity contrast agent was given IV to delineate the left ventricular endocardial borders. The left ventricular internal cavity size was moderately dilated. There is mild left ventricular hypertrophy. Left ventricular diastolic parameters are indeterminate. Right Ventricle: The right ventricular size is normal. No increase in right ventricular wall thickness. Right ventricular systolic function is normal. There is normal pulmonary artery systolic pressure. The tricuspid regurgitant velocity is 2.42 m/s, and  with an assumed right atrial pressure of 5 mmHg, the estimated right ventricular systolic pressure is 25.9 mmHg. Left Atrium: Left atrial size was normal in size. Right Atrium: Right atrial size was normal in size. Pericardium: There is no evidence of pericardial effusion. Mitral Valve: The mitral valve is normal in structure. Mild mitral valve regurgitation. No evidence of mitral valve stenosis. Tricuspid Valve: The tricuspid valve is normal in structure. Tricuspid valve regurgitation is mild . No evidence of tricuspid stenosis. Aortic Valve: The aortic valve is normal in  structure. Aortic valve regurgitation is not visualized. No aortic stenosis is present. Aortic valve mean gradient measures 2.0 mmHg. Aortic valve peak gradient measures 3.3 mmHg. Aortic valve area, by VTI measures 2.24 cm. Pulmonic Valve: The pulmonic valve was normal in structure. Pulmonic valve regurgitation is not visualized. No evidence of pulmonic stenosis. Aorta: The aortic root is normal in size and structure. Venous: The inferior vena cava is normal in size with greater than 50% respiratory variability, suggesting right atrial pressure of 3 mmHg. IAS/Shunts: No atrial level shunt detected by color flow Doppler.  LEFT VENTRICLE PLAX 2D LVIDd:         4.55 cm      Diastology LVIDs:         3.95 cm      LV e' medial:  4.03 cm/s LV PW:         1.05 cm      LV e' lateral: 7.83 cm/s LV IVS:        1.10 cm LVOT diam:     2.00 cm LV SV:         40 LV SV Index:   28 LVOT Area:     3.14 cm  LV Volumes (MOD) LV vol d, MOD A4C: 106.0 ml LV vol s, MOD A4C: 63.8 ml LV SV MOD A4C:     106.0 ml RIGHT VENTRICLE            IVC RV Basal diam:  3.70 cm    IVC diam: 1.20 cm RV S prime:     9.90 cm/s TAPSE (M-mode): 1.9 cm LEFT ATRIUM         Index       RIGHT ATRIUM           Index LA diam:    3.10 cm 2.14 cm/m  RA Area:     13.60 cm  RA Volume:   31.80 ml  21.96 ml/m  AORTIC VALVE                    PULMONIC VALVE AV Area (Vmax):    2.13 cm     PV Vmax:       0.70 m/s AV Area (Vmean):   1.99 cm     PV Peak grad:  2.0 mmHg AV Area (VTI):     2.24 cm AV Vmax:           90.40 cm/s AV Vmean:          61.900 cm/s AV VTI:            0.178 m AV Peak Grad:      3.3 mmHg AV Mean Grad:      2.0 mmHg LVOT Vmax:         61.30 cm/s LVOT Vmean:        39.300 cm/s LVOT VTI:          0.127 m LVOT/AV VTI ratio: 0.71  AORTA Ao Root diam: 3.20 cm Ao Asc diam:  3.20 cm TRICUSPID VALVE TR Peak grad:   23.4 mmHg TR Vmax:        242.00 cm/s  SHUNTS Systemic VTI:  0.13 m Systemic Diam: 2.00 cm Ida Rogue MD  Electronically signed by Ida Rogue MD Signature Date/Time: 12/22/2020/6:59:10 AM    Final     Labs:  CBC: Recent Labs    11/05/20 1839 11/09/20 1100 11/14/20 0430 11/15/20 0424  WBC 6.6 8.1 8.6 10.0  HGB 11.5* 12.1 10.2* 10.6*  HCT 33.4* 36.1 30.4* 32.1*  PLT 139* 163 157 125*    COAGS: Recent Labs    05/03/20 1400 09/21/20 1530 11/05/20 1839 11/09/20 1100  INR 1.5* 1.1 1.1 1.1  APTT 29 29 29 27     BMP: Recent Labs    11/14/20 0430 11/15/20 0424 11/16/20 0345 11/18/20 0039  NA 134* 137 135 134*  K 3.5 3.7 3.8 4.0  CL 103 106 103 100  CO2 27 27 28 25   GLUCOSE 163* 111* 93 115*  BUN 16 15 13 16   CALCIUM 7.9* 8.2* 8.1* 8.3*  CREATININE 0.89 0.81 0.82 0.68  GFRNONAA >60 >60 >60 >60    LIVER FUNCTION TESTS: Recent Labs    09/21/20 1530 11/05/20 1839 11/09/20 1100 11/15/20 0424  BILITOT 0.7 1.5* 1.1 1.0  AST 21 24 24 26   ALT 12 13 14 25   ALKPHOS 95 97 88 62  PROT 7.0 7.2 6.7 5.5*  ALBUMIN 3.4* 3.7 3.2* 2.5*    TUMOR MARKERS: No results for input(s): AFPTM, CEA, CA199, CHROMGRNA in the last 8760 hours.  Assessment and Plan: 80 y.o. 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  She presents today for Port-A-Cath placement to assist with additional treatment.Risks and benefits of image guided port-a-catheter placement was discussed with the patient including, but not limited to bleeding, infection, pneumothorax, or fibrin sheath development and need for additional procedures.  All of the patient's questions were answered, patient is agreeable to proceed. Consent signed and in chart.    Thank you for this interesting consult.  I greatly enjoyed meeting [name] and  look forward to participating in their care.  A  copy of this report was sent to the requesting provider on this date.  Electronically Signed: D. Rowe Robert, PA-C 01/12/2021, 8:34 AM   I spent a total of  25 minutes   in face to face in clinical consultation, greater than 50% of which was counseling/coordinating care for Port-A-Cath placement

## 2021-01-16 ENCOUNTER — Inpatient Hospital Stay: Payer: Medicare Other | Attending: Oncology

## 2021-01-16 ENCOUNTER — Other Ambulatory Visit: Payer: Self-pay

## 2021-01-16 ENCOUNTER — Telehealth: Payer: Self-pay

## 2021-01-16 ENCOUNTER — Other Ambulatory Visit: Payer: Self-pay | Admitting: Emergency Medicine

## 2021-01-16 DIAGNOSIS — D701 Agranulocytosis secondary to cancer chemotherapy: Secondary | ICD-10-CM | POA: Insufficient documentation

## 2021-01-16 DIAGNOSIS — Z8349 Family history of other endocrine, nutritional and metabolic diseases: Secondary | ICD-10-CM | POA: Insufficient documentation

## 2021-01-16 DIAGNOSIS — R531 Weakness: Secondary | ICD-10-CM | POA: Insufficient documentation

## 2021-01-16 DIAGNOSIS — Z9049 Acquired absence of other specified parts of digestive tract: Secondary | ICD-10-CM | POA: Insufficient documentation

## 2021-01-16 DIAGNOSIS — Z5111 Encounter for antineoplastic chemotherapy: Secondary | ICD-10-CM | POA: Insufficient documentation

## 2021-01-16 DIAGNOSIS — Z8249 Family history of ischemic heart disease and other diseases of the circulatory system: Secondary | ICD-10-CM | POA: Insufficient documentation

## 2021-01-16 DIAGNOSIS — T451X5A Adverse effect of antineoplastic and immunosuppressive drugs, initial encounter: Secondary | ICD-10-CM | POA: Insufficient documentation

## 2021-01-16 DIAGNOSIS — Z880 Allergy status to penicillin: Secondary | ICD-10-CM | POA: Insufficient documentation

## 2021-01-16 DIAGNOSIS — I2510 Atherosclerotic heart disease of native coronary artery without angina pectoris: Secondary | ICD-10-CM | POA: Insufficient documentation

## 2021-01-16 DIAGNOSIS — N289 Disorder of kidney and ureter, unspecified: Secondary | ICD-10-CM | POA: Insufficient documentation

## 2021-01-16 DIAGNOSIS — Z886 Allergy status to analgesic agent status: Secondary | ICD-10-CM | POA: Insufficient documentation

## 2021-01-16 DIAGNOSIS — R197 Diarrhea, unspecified: Secondary | ICD-10-CM | POA: Insufficient documentation

## 2021-01-16 DIAGNOSIS — Z881 Allergy status to other antibiotic agents status: Secondary | ICD-10-CM | POA: Insufficient documentation

## 2021-01-16 DIAGNOSIS — E871 Hypo-osmolality and hyponatremia: Secondary | ICD-10-CM | POA: Insufficient documentation

## 2021-01-16 DIAGNOSIS — J90 Pleural effusion, not elsewhere classified: Secondary | ICD-10-CM | POA: Insufficient documentation

## 2021-01-16 DIAGNOSIS — I509 Heart failure, unspecified: Secondary | ICD-10-CM | POA: Insufficient documentation

## 2021-01-16 DIAGNOSIS — Z79899 Other long term (current) drug therapy: Secondary | ICD-10-CM | POA: Insufficient documentation

## 2021-01-16 DIAGNOSIS — E876 Hypokalemia: Secondary | ICD-10-CM | POA: Insufficient documentation

## 2021-01-16 DIAGNOSIS — Z923 Personal history of irradiation: Secondary | ICD-10-CM | POA: Insufficient documentation

## 2021-01-16 DIAGNOSIS — Z8261 Family history of arthritis: Secondary | ICD-10-CM | POA: Insufficient documentation

## 2021-01-16 DIAGNOSIS — I252 Old myocardial infarction: Secondary | ICD-10-CM | POA: Insufficient documentation

## 2021-01-16 DIAGNOSIS — D649 Anemia, unspecified: Secondary | ICD-10-CM | POA: Insufficient documentation

## 2021-01-16 DIAGNOSIS — R5383 Other fatigue: Secondary | ICD-10-CM | POA: Insufficient documentation

## 2021-01-16 DIAGNOSIS — R110 Nausea: Secondary | ICD-10-CM | POA: Insufficient documentation

## 2021-01-16 DIAGNOSIS — E785 Hyperlipidemia, unspecified: Secondary | ICD-10-CM | POA: Insufficient documentation

## 2021-01-16 DIAGNOSIS — C3432 Malignant neoplasm of lower lobe, left bronchus or lung: Secondary | ICD-10-CM | POA: Insufficient documentation

## 2021-01-16 DIAGNOSIS — I110 Hypertensive heart disease with heart failure: Secondary | ICD-10-CM | POA: Insufficient documentation

## 2021-01-16 DIAGNOSIS — Z801 Family history of malignant neoplasm of trachea, bronchus and lung: Secondary | ICD-10-CM | POA: Insufficient documentation

## 2021-01-16 DIAGNOSIS — Z853 Personal history of malignant neoplasm of breast: Secondary | ICD-10-CM | POA: Insufficient documentation

## 2021-01-16 DIAGNOSIS — Z87891 Personal history of nicotine dependence: Secondary | ICD-10-CM | POA: Insufficient documentation

## 2021-01-16 DIAGNOSIS — I739 Peripheral vascular disease, unspecified: Secondary | ICD-10-CM | POA: Insufficient documentation

## 2021-01-16 MED ORDER — LIDOCAINE-PRILOCAINE 2.5-2.5 % EX CREA: 1.0000 "application " | TOPICAL_CREAM | CUTANEOUS | 1 refills | Status: AC | PRN

## 2021-01-16 NOTE — Telephone Encounter (Signed)
Encounter opened in error

## 2021-01-16 NOTE — Telephone Encounter (Signed)
At her last visit with our office she was instructed to take metoprolol succinate (Toprol-XL) 50 mg once daily.  It appears when she was seen by outside office on 01/11/2021 she reported taking this medication twice daily.  This may have been in the setting of the patient's pharmacy refilling an old June twice daily prescription.  Historically, it appears that she has preferred to defer escalation of evidence-based medical therapy.  Recommendations: -Patient should be taking Toprol-XL 50 mg once daily -Follow-up as scheduled later this month, if not sooner, to reassess and escalate GDMT given her severe persistent cardiomyopathy

## 2021-01-16 NOTE — Telephone Encounter (Signed)
Refill request received from [facility] for metoprolol 50mg  ER Take 1 tablet PO in the morning and at bedtime.  Please clarify dosage of metoprolol.   See previous telephone encounter from 11/03/20 below. Marrianne Mood is no longer with our practice and it does not look like she ever clarified this before she left. Patient last seen by her on 10/06/20. This AVS states patient is to take metoprolol 50mg  once daily.  Med list also reflects metoprolol succinate 50mg -1 tablet PO once daily per Terrilee Croak, MD.   Spoke with patient who states she has been taking metoprolol ER 50mg -1 tablet PO twice daily so she has run out.   Thank you!

## 2021-01-17 ENCOUNTER — Inpatient Hospital Stay (HOSPITAL_BASED_OUTPATIENT_CLINIC_OR_DEPARTMENT_OTHER): Payer: Medicare Other | Admitting: Oncology

## 2021-01-17 ENCOUNTER — Inpatient Hospital Stay: Payer: Medicare Other

## 2021-01-17 ENCOUNTER — Encounter: Payer: Self-pay | Admitting: *Deleted

## 2021-01-17 ENCOUNTER — Other Ambulatory Visit: Payer: Self-pay

## 2021-01-17 VITALS — BP 118/52 | HR 75 | Temp 96.5°F | Resp 16

## 2021-01-17 VITALS — BP 115/52 | HR 84 | Temp 97.2°F | Resp 16 | Wt 112.2 lb

## 2021-01-17 DIAGNOSIS — R5383 Other fatigue: Secondary | ICD-10-CM | POA: Diagnosis not present

## 2021-01-17 DIAGNOSIS — Z923 Personal history of irradiation: Secondary | ICD-10-CM | POA: Diagnosis not present

## 2021-01-17 DIAGNOSIS — N289 Disorder of kidney and ureter, unspecified: Secondary | ICD-10-CM | POA: Diagnosis not present

## 2021-01-17 DIAGNOSIS — I252 Old myocardial infarction: Secondary | ICD-10-CM | POA: Diagnosis not present

## 2021-01-17 DIAGNOSIS — I739 Peripheral vascular disease, unspecified: Secondary | ICD-10-CM | POA: Diagnosis not present

## 2021-01-17 DIAGNOSIS — C3432 Malignant neoplasm of lower lobe, left bronchus or lung: Secondary | ICD-10-CM

## 2021-01-17 DIAGNOSIS — Z853 Personal history of malignant neoplasm of breast: Secondary | ICD-10-CM | POA: Diagnosis not present

## 2021-01-17 DIAGNOSIS — I509 Heart failure, unspecified: Secondary | ICD-10-CM | POA: Diagnosis not present

## 2021-01-17 DIAGNOSIS — J90 Pleural effusion, not elsewhere classified: Secondary | ICD-10-CM | POA: Diagnosis not present

## 2021-01-17 DIAGNOSIS — R531 Weakness: Secondary | ICD-10-CM | POA: Diagnosis not present

## 2021-01-17 DIAGNOSIS — D701 Agranulocytosis secondary to cancer chemotherapy: Secondary | ICD-10-CM | POA: Diagnosis not present

## 2021-01-17 DIAGNOSIS — E876 Hypokalemia: Secondary | ICD-10-CM | POA: Diagnosis not present

## 2021-01-17 DIAGNOSIS — Z5111 Encounter for antineoplastic chemotherapy: Secondary | ICD-10-CM | POA: Diagnosis present

## 2021-01-17 DIAGNOSIS — E785 Hyperlipidemia, unspecified: Secondary | ICD-10-CM | POA: Diagnosis not present

## 2021-01-17 DIAGNOSIS — E871 Hypo-osmolality and hyponatremia: Secondary | ICD-10-CM | POA: Diagnosis not present

## 2021-01-17 DIAGNOSIS — R197 Diarrhea, unspecified: Secondary | ICD-10-CM | POA: Diagnosis not present

## 2021-01-17 DIAGNOSIS — I110 Hypertensive heart disease with heart failure: Secondary | ICD-10-CM | POA: Diagnosis not present

## 2021-01-17 DIAGNOSIS — D649 Anemia, unspecified: Secondary | ICD-10-CM | POA: Diagnosis not present

## 2021-01-17 DIAGNOSIS — I2510 Atherosclerotic heart disease of native coronary artery without angina pectoris: Secondary | ICD-10-CM | POA: Diagnosis not present

## 2021-01-17 DIAGNOSIS — T451X5A Adverse effect of antineoplastic and immunosuppressive drugs, initial encounter: Secondary | ICD-10-CM | POA: Diagnosis not present

## 2021-01-17 DIAGNOSIS — R110 Nausea: Secondary | ICD-10-CM | POA: Diagnosis not present

## 2021-01-17 DIAGNOSIS — Z87891 Personal history of nicotine dependence: Secondary | ICD-10-CM | POA: Diagnosis not present

## 2021-01-17 DIAGNOSIS — Z79899 Other long term (current) drug therapy: Secondary | ICD-10-CM | POA: Diagnosis not present

## 2021-01-17 LAB — COMPREHENSIVE METABOLIC PANEL
ALT: 11 U/L (ref 0–44)
AST: 24 U/L (ref 15–41)
Albumin: 3.4 g/dL — ABNORMAL LOW (ref 3.5–5.0)
Alkaline Phosphatase: 88 U/L (ref 38–126)
Anion gap: 8 (ref 5–15)
BUN: 11 mg/dL (ref 8–23)
CO2: 25 mmol/L (ref 22–32)
Calcium: 8.7 mg/dL — ABNORMAL LOW (ref 8.9–10.3)
Chloride: 101 mmol/L (ref 98–111)
Creatinine, Ser: 0.86 mg/dL (ref 0.44–1.00)
GFR, Estimated: >60 mL/min (ref >60)
Glucose, Bld: 140 mg/dL — ABNORMAL HIGH (ref 70–99)
Potassium: 3 mmol/L — ABNORMAL LOW (ref 3.5–5.1)
Sodium: 134 mmol/L — ABNORMAL LOW (ref 135–145)
Total Bilirubin: 0.9 mg/dL (ref 0.3–1.2)
Total Protein: 6.9 g/dL (ref 6.5–8.1)

## 2021-01-17 LAB — CBC WITH DIFFERENTIAL/PLATELET
Abs Immature Granulocytes: 0.01 10*3/uL (ref 0.00–0.07)
Basophils Absolute: 0 10*3/uL (ref 0.0–0.1)
Basophils Relative: 0 %
Eosinophils Absolute: 0.1 10*3/uL (ref 0.0–0.5)
Eosinophils Relative: 3 %
HCT: 32.3 % — ABNORMAL LOW (ref 36.0–46.0)
Hemoglobin: 10.8 g/dL — ABNORMAL LOW (ref 12.0–15.0)
Immature Granulocytes: 0 %
Lymphocytes Relative: 27 %
Lymphs Abs: 1.2 10*3/uL (ref 0.7–4.0)
MCH: 32.1 pg (ref 26.0–34.0)
MCHC: 33.4 g/dL (ref 30.0–36.0)
MCV: 96.1 fL (ref 80.0–100.0)
Monocytes Absolute: 0.3 10*3/uL (ref 0.1–1.0)
Monocytes Relative: 7 %
Neutro Abs: 2.7 10*3/uL (ref 1.7–7.7)
Neutrophils Relative %: 63 %
Platelets: 180 10*3/uL (ref 150–400)
RBC: 3.36 MIL/uL — ABNORMAL LOW (ref 3.87–5.11)
RDW: 13.4 % (ref 11.5–15.5)
WBC: 4.3 10*3/uL (ref 4.0–10.5)
nRBC: 0 % (ref 0.0–0.2)

## 2021-01-17 LAB — MAGNESIUM: Magnesium: 1.6 mg/dL — ABNORMAL LOW (ref 1.7–2.4)

## 2021-01-17 MED ORDER — HEPARIN SOD (PORK) LOCK FLUSH 100 UNIT/ML IV SOLN
500.0000 [IU] | Freq: Once | INTRAVENOUS | Status: AC | PRN
  Filled 2021-01-17: qty 5

## 2021-01-17 MED ORDER — SODIUM CHLORIDE 0.9 % IV SOLN
Freq: Once | INTRAVENOUS | Status: AC
  Filled 2021-01-17: qty 250

## 2021-01-17 MED ORDER — SODIUM CHLORIDE 0.9 % IV SOLN
150.0000 mg | Freq: Once | INTRAVENOUS | Status: AC
  Administered 2021-01-17: 150 mg via INTRAVENOUS
  Filled 2021-01-17: qty 150

## 2021-01-17 MED ORDER — SODIUM CHLORIDE 0.9 % IV SOLN
75.0000 mg/m2 | Freq: Once | INTRAVENOUS | Status: AC
  Administered 2021-01-17: 108 mg via INTRAVENOUS
  Filled 2021-01-17: qty 100

## 2021-01-17 MED ORDER — POTASSIUM CHLORIDE IN NACL 20-0.9 MEQ/L-% IV SOLN
Freq: Once | INTRAVENOUS | Status: AC
  Filled 2021-01-17: qty 1000

## 2021-01-17 MED ORDER — MAGNESIUM SULFATE 2 GM/50ML IV SOLN
2.0000 g | Freq: Once | INTRAVENOUS | Status: AC
  Administered 2021-01-17: 2 g via INTRAVENOUS
  Filled 2021-01-17: qty 50

## 2021-01-17 MED ORDER — PROCHLORPERAZINE MALEATE 10 MG PO TABS: 10.0000 mg | ORAL_TABLET | Freq: Four times a day (QID) | ORAL | 1 refills | Status: DC | PRN

## 2021-01-17 MED ORDER — SODIUM CHLORIDE 0.9 % IV SOLN
75.0000 mg/m2 | Freq: Once | INTRAVENOUS | Status: AC
  Administered 2021-01-17: 110 mg via INTRAVENOUS
  Filled 2021-01-17: qty 11

## 2021-01-17 MED ORDER — SODIUM CHLORIDE 0.9 % IV SOLN
10.0000 mg | Freq: Once | INTRAVENOUS | Status: AC
  Administered 2021-01-17: 10 mg via INTRAVENOUS
  Filled 2021-01-17: qty 10

## 2021-01-17 MED ORDER — PALONOSETRON HCL INJECTION 0.25 MG/5ML
0.2500 mg | Freq: Once | INTRAVENOUS | Status: AC
  Administered 2021-01-17: 0.25 mg via INTRAVENOUS
  Filled 2021-01-17: qty 5

## 2021-01-17 MED ORDER — SODIUM CHLORIDE 0.9% FLUSH
10.0000 mL | Freq: Once | INTRAVENOUS | Status: AC
  Administered 2021-01-17: 10 mL via INTRAVENOUS
  Filled 2021-01-17: qty 10

## 2021-01-17 MED ORDER — METOPROLOL SUCCINATE ER 50 MG PO TB24: 50.0000 mg | ORAL_TABLET | Freq: Every day | ORAL | 0 refills | Status: DC

## 2021-01-17 MED ORDER — HEPARIN SOD (PORK) LOCK FLUSH 100 UNIT/ML IV SOLN
INTRAVENOUS | Status: AC
  Administered 2021-01-17: 500 [IU]
  Filled 2021-01-17: qty 5

## 2021-01-17 MED ORDER — ONDANSETRON HCL 8 MG PO TABS: 8.0000 mg | ORAL_TABLET | Freq: Two times a day (BID) | ORAL | 1 refills | Status: AC | PRN

## 2021-01-17 NOTE — Progress Notes (Signed)
Nutrition Assessment   Reason for Assessment:  Patient identified on Malnutrition Screening report for weight loss   ASSESSMENT:  80 year old female with stage III SCC of left lung.  S/p lobectomy on 11/13/20.  Past medical history of left breast cancer, CHF, HLD, CAD, MI, PVD, HTN.  Patient starting adjuvant chemotherapy.   Met with patient during infusion. Reports that she has been eating small frequent meals during the day.  Last night ate less than half of chicken sandwich and more than 1/2 of serving of fries.  Sometimes for lunch will eat a sandwich.  Has not tried oral nutrition supplements.     Medications: MVI, zofran, KCL, compazine, calcium and vit D, lasix   Labs:  Na 134, K 3.0, glucose 140, Mag 1.6   Anthropometrics:   Height: 60 inches Weight: 112 lb today 117 lb on 10/19 BMI: 21  4% weight loss in the last 2 1/2 months   Estimated Energy Needs  Kcals: 1590-1800 Protein: 80-90 g Fluid: 1.5 L   NUTRITION DIAGNOSIS: Inadequate oral intake related to cancer as evidenced by 4% weight loss in the last 2 1/2 months   INTERVENTION:  Reviewed importance of good nutrition during treatment and weight maintenance Encouraged high calorie, high protein diet. Handout provided.  Samples of ensure plus, ensure complete, orgain and CIB packet with coupons given Contact information given   MONITORING, EVALUATION, GOAL: weight loss, intake   Next Visit: to be determined  Martise Waddell B. Zenia Resides, [place], [place] Registered Dietitian [phone] (mobile)

## 2021-01-17 NOTE — Telephone Encounter (Signed)
Called pt to go over recommences. No answer, lmtcb.

## 2021-01-17 NOTE — Progress Notes (Signed)
Pt c/o nausea and requested refill on nausea meds. Advanced Directive packet provided to patient. 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 Takes gabapentin for the same.

## 2021-01-17 NOTE — Progress Notes (Signed)
Met with patient and her son during follow up visit with Dr. Grayland Ormond prior to starting chemotherapy. All questions answered during visit. Pt informed that will be given appts before leaving today. Instructed to call with any questions or needs. Pt verbalized understanding.

## 2021-01-17 NOTE — Patient Instructions (Signed)
[facility] [place]  Discharge Instructions: Thank you for choosing [facility] to provide your oncology and hematology care.  If you have a lab appointment with the [facility], please go directly to the [facility] and check in at the registration area.  Wear comfortable clothing and clothing appropriate for easy access to any Portacath or PICC line.   We strive to give you quality time with your provider. You may need to reschedule your appointment if you arrive late (15 or more minutes).  Arriving late affects you and other patients whose appointments are after yours.  Also, if you miss three or more appointments without notifying the office, you may be dismissed from the clinic at the providers discretion.      For prescription refill requests, have your pharmacy contact our office and allow 72 hours for refills to be completed.    Today you received the following chemotherapy and/or immunotherapy agents Cisplatin and Taxotere      To help prevent nausea and vomiting after your treatment, we encourage you to take your nausea medication as directed.  BELOW ARE SYMPTOMS THAT SHOULD BE REPORTED IMMEDIATELY: *FEVER GREATER THAN 100.4 F (38 C) OR HIGHER *CHILLS OR SWEATING *NAUSEA AND VOMITING THAT IS NOT CONTROLLED WITH YOUR NAUSEA MEDICATION *UNUSUAL SHORTNESS OF BREATH *UNUSUAL BRUISING OR BLEEDING *URINARY PROBLEMS (pain or burning when urinating, or frequent urination) *BOWEL PROBLEMS (unusual diarrhea, constipation, pain near the anus) TENDERNESS IN MOUTH AND THROAT WITH OR WITHOUT PRESENCE OF ULCERS (sore throat, sores in mouth, or a toothache) UNUSUAL RASH, SWELLING OR PAIN  UNUSUAL VAGINAL DISCHARGE OR ITCHING   Items with * indicate a potential emergency and should be followed up as soon as possible or go to the Emergency Department if any problems should occur.  Please show the CHEMOTHERAPY ALERT CARD or IMMUNOTHERAPY ALERT CARD at  check-in to the Emergency Department and triage nurse.  Should you have questions after your visit or need to cancel or reschedule your appointment, please contact [facility] [place] [facility] [place]  [phone] and follow the prompts.  Office hours are 8:00 a.m. to 4:30 p.m. Monday - Friday. Please note that voicemails left after 4:00 p.m. may not be returned until the following business day.  We are closed weekends and major holidays. You have access to a nurse at all times for urgent questions. Please call the main number to the clinic [phone] and follow the prompts.  For any non-urgent questions, you may also contact your provider using MyChart. We now offer e-Visits for anyone 8 and older to request care online for non-urgent symptoms. For details visit [email or web address].   Also download the MyChart app! Go to the app store, search "MyChart", open the app, select [facility], and log in with your MyChart username and password.  Due to Covid, a mask is required upon entering the hospital/clinic. If you do not have a mask, one will be given to you upon arrival. For doctor visits, patients may have 1 support person aged 2 or older with them. For treatment visits, patients cannot have anyone with them due to current Covid guidelines and our immunocompromised population.    Cisplatin injection What is this medication? CISPLATIN (SIS pla tin) is a chemotherapy drug. It targets fast dividing cells, like cancer cells, and causes these cells to die. This medicine is used to treat many types of cancer like bladder, ovarian, and testicular cancers. This medicine may be used for other purposes;  ask your health care provider or pharmacist if you have questions. COMMON BRAND NAME(S): Platinol, Platinol -AQ What should I tell my care team before I take this medication? They need to know if you have any of these conditions: eye disease, vision problems hearing problems kidney  disease low blood counts, like white cells, platelets, or red blood cells tingling of the fingers or toes, or other nerve disorder an unusual or allergic reaction to cisplatin, carboplatin, oxaliplatin, other medicines, foods, dyes, or preservatives pregnant or trying to get pregnant breast-feeding How should I use this medication? This drug is given as an infusion into a vein. It is administered in a hospital or clinic by a specially trained health care professional. Talk to your pediatrician regarding the use of this medicine in children. Special care may be needed. Overdosage: If you think you have taken too much of this medicine contact a poison control center or emergency room at once. NOTE: This medicine is only for you. Do not share this medicine with others. What if I miss a dose? It is important not to miss a dose. Call your doctor or health care professional if you are unable to keep an appointment. What may interact with this medication? This medicine may interact with the following medications: foscarnet certain antibiotics like amikacin, gentamicin, neomycin, polymyxin B, streptomycin, tobramycin, vancomycin This list may not describe all possible interactions. Give your health care provider a list of all the medicines, herbs, non-prescription drugs, or dietary supplements you use. Also tell them if you smoke, drink alcohol, or use illegal drugs. Some items may interact with your medicine. What should I watch for while using this medication? Your condition will be monitored carefully while you are receiving this medicine. You will need important blood work done while you are taking this medicine. This drug may make you feel generally unwell. This is not uncommon, as chemotherapy can affect healthy cells as well as cancer cells. Report any side effects. Continue your course of treatment even though you feel ill unless your doctor tells you to stop. This medicine may increase your  risk of getting an infection. Call your healthcare professional for advice if you get a fever, chills, or sore throat, or other symptoms of a cold or flu. Do not treat yourself. Try to avoid being around people who are sick. Avoid taking medicines that contain aspirin, acetaminophen, ibuprofen, naproxen, or ketoprofen unless instructed by your healthcare professional. These medicines may hide a fever. This medicine may increase your risk to bruise or bleed. Call your doctor or health care professional if you notice any unusual bleeding. Be careful brushing and flossing your teeth or using a toothpick because you may get an infection or bleed more easily. If you have any dental work done, tell your dentist you are receiving this medicine. Do not become pregnant while taking this medicine or for 14 months after stopping it. Women should inform their healthcare professional if they wish to become pregnant or think they might be pregnant. Men should not father a child while taking this medicine and for 11 months after stopping it. There is potential for serious side effects to an unborn child. Talk to your healthcare professional for more information. Do not breast-feed an infant while taking this medicine. This medicine has caused ovarian failure in some women. This medicine may make it more difficult to get pregnant. Talk to your healthcare professional if you are concerned about your fertility. This medicine has caused decreased sperm counts  in some men. This may make it more difficult to father a child. Talk to your healthcare professional if you are concerned about your fertility. Drink fluids as directed while you are taking this medicine. This will help protect your kidneys. Call your doctor or health care professional if you get diarrhea. Do not treat yourself. What side effects may I notice from receiving this medication? Side effects that you should report to your doctor or health care professional  as soon as possible: allergic reactions like skin rash, itching or hives, swelling of the face, lips, or tongue blurred vision changes in vision decreased hearing or ringing of the ears nausea, vomiting pain, redness, or irritation at site where injected pain, tingling, numbness in the hands or feet signs and symptoms of bleeding such as bloody or black, tarry stools; red or dark brown urine; spitting up blood or brown material that looks like coffee grounds; red spots on the skin; unusual bruising or bleeding from the eyes, gums, or nose signs and symptoms of infection like fever; chills; cough; sore throat; pain or trouble passing urine signs and symptoms of kidney injury like trouble passing urine or change in the amount of urine signs and symptoms of low red blood cells or anemia such as unusually weak or tired; feeling faint or lightheaded; falls; breathing problems Side effects that usually do not require medical attention (report to your doctor or health care professional if they continue or are bothersome): loss of appetite mouth sores muscle cramps This list may not describe all possible side effects. Call your doctor for medical advice about side effects. You may report side effects to FDA at 1-800-FDA-1088. Where should I keep my medication? This drug is given in a hospital or clinic and will not be stored at home. NOTE: This sheet is a summary. It may not cover all possible information. If you have questions about this medicine, talk to your doctor, pharmacist, or health care provider.  2022 Elsevier/Gold Standard (2020-09-19 00:00:00)   Docetaxel injection What is this medication? DOCETAXEL (doe se TAX el) is a chemotherapy drug. It targets fast dividing cells, like cancer cells, and causes these cells to die. This medicine is used to treat many types of cancers like breast cancer, certain stomach cancers, head and neck cancer, lung cancer, and prostate cancer. This medicine  may be used for other purposes; ask your health care provider or pharmacist if you have questions. COMMON BRAND NAME(S): Docefrez, Taxotere What should I tell my care team before I take this medication? They need to know if you have any of these conditions: infection (especially a virus infection such as chickenpox, cold sores, or herpes) liver disease low blood counts, like low white cell, platelet, or red cell counts an unusual or allergic reaction to docetaxel, polysorbate 80, other chemotherapy agents, other medicines, foods, dyes, or preservatives pregnant or trying to get pregnant breast-feeding How should I use this medication? This drug is given as an infusion into a vein. It is administered in a hospital or clinic by a specially trained health care professional. Talk to your pediatrician regarding the use of this medicine in children. Special care may be needed. Overdosage: If you think you have taken too much of this medicine contact a poison control center or emergency room at once. NOTE: This medicine is only for you. Do not share this medicine with others. What if I miss a dose? It is important not to miss your dose. Call your doctor or health  care professional if you are unable to keep an appointment. What may interact with this medication? Do not take this medicine with any of the following medications: live virus vaccines This medicine may also interact with the following medications: aprepitant certain antibiotics like erythromycin or clarithromycin certain antivirals for HIV or hepatitis certain medicines for fungal infections like fluconazole, itraconazole, ketoconazole, posaconazole, or voriconazole cimetidine ciprofloxacin conivaptan cyclosporine dronedarone fluvoxamine grapefruit juice imatinib verapamil This list may not describe all possible interactions. Give your health care provider a list of all the medicines, herbs, non-prescription drugs, or dietary  supplements you use. Also tell them if you smoke, drink alcohol, or use illegal drugs. Some items may interact with your medicine. What should I watch for while using this medication? Your condition will be monitored carefully while you are receiving this medicine. You will need important blood work done while you are taking this medicine. Call your doctor or health care professional for advice if you get a fever, chills or sore throat, or other symptoms of a cold or flu. Do not treat yourself. This drug decreases your body's ability to fight infections. Try to avoid being around people who are sick. Some products may contain alcohol. Ask your health care professional if this medicine contains alcohol. Be sure to tell all health care professionals you are taking this medicine. Certain medicines, like metronidazole and disulfiram, can cause an unpleasant reaction when taken with alcohol. The reaction includes flushing, headache, nausea, vomiting, sweating, and increased thirst. The reaction can last from 30 minutes to several hours. You may get drowsy or dizzy. Do not drive, use machinery, or do anything that needs mental alertness until you know how this medicine affects you. Do not stand or sit up quickly, especially if you are an older patient. This reduces the risk of dizzy or fainting spells. Alcohol may interfere with the effect of this medicine. Talk to your health care professional about your risk of cancer. You may be more at risk for certain types of cancer if you take this medicine. Do not become pregnant while taking this medicine or for 6 months after stopping it. Women should inform their doctor if they wish to become pregnant or think they might be pregnant. There is a potential for serious side effects to an unborn child. Talk to your health care professional or pharmacist for more information. Do not breast-feed an infant while taking this medicine or for 1 week after stopping it. Males who  get this medicine must use a condom during sex with females who can get pregnant. If you get a woman pregnant, the baby could have birth defects. The baby could die before they are born. You will need to continue wearing a condom for 3 months after stopping the medicine. Tell your health care provider right away if your partner becomes pregnant while you are taking this medicine. This may interfere with the ability to father a child. You should talk to your doctor or health care professional if you are concerned about your fertility. What side effects may I notice from receiving this medication? Side effects that you should report to your doctor or health care professional as soon as possible: allergic reactions like skin rash, itching or hives, swelling of the face, lips, or tongue blurred vision breathing problems changes in vision low blood counts - This drug may decrease the number of white blood cells, red blood cells and platelets. You may be at increased risk for infections and bleeding. nausea  and vomiting pain, redness or irritation at site where injected pain, tingling, numbness in the hands or feet redness, blistering, peeling, or loosening of the skin, including inside the mouth signs of decreased platelets or bleeding - bruising, pinpoint red spots on the skin, black, tarry stools, nosebleeds signs of decreased red blood cells - unusually weak or tired, fainting spells, lightheadedness signs of infection - fever or chills, cough, sore throat, pain or difficulty passing urine swelling of the ankle, feet, hands Side effects that usually do not require medical attention (report to your doctor or health care professional if they continue or are bothersome): constipation diarrhea fingernail or toenail changes hair loss loss of appetite mouth sores muscle pain This list may not describe all possible side effects. Call your doctor for medical advice about side effects. You may report  side effects to FDA at 1-800-FDA-1088. Where should I keep my medication? This drug is given in a hospital or clinic and will not be stored at home. NOTE: This sheet is a summary. It may not cover all possible information. If you have questions about this medicine, talk to your doctor, pharmacist, or health care provider.  2022 Elsevier/Gold Standard (2020-09-19 00:00:00)    Fosaprepitant injection What is this medication? FOSAPREPITANT (fos ap RE pi tant) is used together with other medicines to prevent nausea and vomiting caused by cancer treatment (chemotherapy). This medicine may be used for other purposes; ask your health care provider or pharmacist if you have questions. COMMON BRAND NAME(S): Emend What should I tell my care team before I take this medication? They need to know if you have any of these conditions: liver disease an unusual or allergic reaction to fosaprepitant, aprepitant, medicines, foods, dyes, or preservatives pregnant or trying to get pregnant breast-feeding How should I use this medication? This medicine is for injection into a vein. It is given by a health care professional in a hospital or clinic setting. Talk to your pediatrician regarding the use of this medicine in children. While this drug may be prescribed for children as young as 6 months for selected conditions, precautions do apply. Overdosage: If you think you have taken too much of this medicine contact a poison control center or emergency room at once. NOTE: This medicine is only for you. Do not share this medicine with others. What if I miss a dose? This does not apply. What may interact with this medication? Do not take this medicine with any of these medicines: cisapride flibanserin lomitapide pimozide This medicine may also interact with the following medications: diltiazem female hormones, like estrogens or progestins and birth control pills medicines for fungal infections like  ketoconazole and itraconazole medicines for HIV medicines for seizures or to control epilepsy like carbamazepine or phenytoin medicines used for sleep or anxiety disorders like alprazolam, diazepam, or midazolam nefazodone paroxetine ranolazine rifampin some chemotherapy medications like etoposide, ifosfamide, vinblastine, vincristine some antibiotics like clarithromycin, erythromycin, troleandomycin steroid medicines like dexamethasone or methylprednisolone tolbutamide warfarin This list may not describe all possible interactions. Give your health care provider a list of all the medicines, herbs, non-prescription drugs, or dietary supplements you use. Also tell them if you smoke, drink alcohol, or use illegal drugs. Some items may interact with your medicine. What should I watch for while using this medication? Do not take this medicine if you already have nausea and vomiting. Ask your health care provider what to do if you already have nausea. Birth control pills and other methods of hormonal contraception (  for example, IUD or patch) may not work properly while you are taking this medicine. Use an extra method of birth control during treatment and for 1 month after your last dose of fosaprepitant. This medicine should not be used continuously for a long time. Visit your doctor or health care professional for regular check-ups. This medicine may change your liver function blood test results. What side effects may I notice from receiving this medication? Side effects that you should report to your doctor or health care professional as soon as possible: allergic reactions like skin rash, itching or hives, swelling of the face, lips, or tongue breathing problems changes in heart rhythm high or low blood pressure pain, redness, or irritation at site where injected rectal bleeding serious dizziness or disorientation, confusion sharp or severe stomach pain sharp pain in your leg Side effects  that usually do not require medical attention (report to your doctor or health care professional if they continue or are bothersome): constipation or diarrhea hair loss headache hiccups loss of appetite nausea upset stomach tiredness This list may not describe all possible side effects. Call your doctor for medical advice about side effects. You may report side effects to FDA at 1-800-FDA-1088. Where should I keep my medication? This drug is given in a hospital or clinic and will not be stored at home. NOTE: This sheet is a summary. It may not cover all possible information. If you have questions about this medicine, talk to your doctor, pharmacist, or health care provider.  2022 Elsevier/Gold Standard (2016-04-19 00:00:00)   Palonosetron Injection What is this medication? PALONOSETRON (pal oh NOE se tron) is used to prevent nausea and vomiting caused by chemotherapy. It also helps prevent delayed nausea and vomiting that may occur a few days after your treatment. This medicine may be used for other purposes; ask your health care provider or pharmacist if you have questions. COMMON BRAND NAME(S): Aloxi What should I tell my care team before I take this medication? They need to know if you have any of these conditions: an unusual or allergic reaction to palonosetron, dolasetron, granisetron, ondansetron, other medicines, foods, dyes, or preservatives pregnant or trying to get pregnant breast-feeding How should I use this medication? This medicine is for infusion into a vein. It is given by a health care professional in a hospital or clinic setting. Talk to your pediatrician regarding the use of this medicine in children. While this drug may be prescribed for children as young as 1 month for selected conditions, precautions do apply. Overdosage: If you think you have taken too much of this medicine contact a poison control center or emergency room at once. NOTE: This medicine is only for  you. Do not share this medicine with others. What if I miss a dose? This does not apply. What may interact with this medication? certain medicines for depression, anxiety, or psychotic disturbances fentanyl linezolid MAOIs like Carbex, Eldepryl, Marplan, Nardil, and Parnate methylene blue (injected into a vein) tramadol This list may not describe all possible interactions. Give your health care provider a list of all the medicines, herbs, non-prescription drugs, or dietary supplements you use. Also tell them if you smoke, drink alcohol, or use illegal drugs. Some items may interact with your medicine. What should I watch for while using this medication? Your condition will be monitored carefully while you are receiving this medicine. What side effects may I notice from receiving this medication? Side effects that you should report to your doctor or health  care professional as soon as possible: allergic reactions like skin rash, itching or hives, swelling of the face, lips, or tongue breathing problems confusion dizziness fast, irregular heartbeat fever and chills loss of balance or coordination seizures sweating swelling of the hands and feet tremors unusually weak or tired Side effects that usually do not require medical attention (report to your doctor or health care professional if they continue or are bothersome): constipation or diarrhea headache This list may not describe all possible side effects. Call your doctor for medical advice about side effects. You may report side effects to FDA at 1-800-FDA-1088. Where should I keep my medication? This drug is given in a hospital or clinic and will not be stored at home. NOTE: This sheet is a summary. It may not cover all possible information. If you have questions about this medicine, talk to your doctor, pharmacist, or health care provider.  2022 Elsevier/Gold Standard (2012-11-06 00:00:00)

## 2021-01-17 NOTE — Progress Notes (Signed)
Potassium 3.0, Magnesium 1.6 Per Gust Rung RN per Dr. Grayland Ormond proceed with Treatment, no additional Potassium or magnesium needed.

## 2021-01-18 ENCOUNTER — Telehealth: Payer: Self-pay | Admitting: *Deleted

## 2021-01-18 ENCOUNTER — Encounter: Payer: Self-pay | Admitting: Oncology

## 2021-01-18 NOTE — Telephone Encounter (Signed)
I contacted patient s/p first chemotherapy treatment (cisplatin/taxotere) yesterday. She has no medical complaints at this time. She tolerated the first treatment well. She was currently reviewing her chemotherapy teaching book when I called her. She has no questions or concerns at this time. She understands to reach out to our office/triage if symptoms developed. I stressed the importance of frequent oral hydration/intake. Pt gave verbal understanding.

## 2021-01-19 DIAGNOSIS — C3432 Malignant neoplasm of lower lobe, left bronchus or lung: Secondary | ICD-10-CM

## 2021-01-19 NOTE — Research (Signed)
Trial:  [facility] Pilot Chair Exercise Protocol / [employer] Protocol Patient [name] was identified by Jeral Fruit, RN as a potential candidate for the above listed study. Dr. Grayland Ormond was made aware of patient's potential eligibility and approved calling the patient to assess her possible interest.  This Clinical Research Nurse spoke with [name], [id number], on 01/19/21 in a manner and location that ensures patient privacy to discuss participation in the above listed research study, via telephone encounter.  Patient name and date of birth were verified for identification. Patient is Unaccompanied.  The informed consent document and separate HIPAA Authorization were reviewed with the patient briefly.  Patient reads, speaks, and understands [place].   Approximately 15 minutes were spent with the patient reviewing the informed consent documents.  Patient is willing to meet with the research  nurse after her appointments with Dr. Grayland Ormond and Merrily Pew Borders on 01/24/2021 to review the ICF's in more detail prior to making a decision.  The patient is aware that her participation in clinical trials is on a voluntary basis, and if she were to decide not to participate in protocol treatment her care here would not be affected.  Jeral Fruit, RN 01/19/21 11:02 AM

## 2021-01-19 NOTE — Telephone Encounter (Signed)
Spoke with the patient. Patient made aware of Christell Faith, PA response and recommendation. Patient confirms that she is taking Metoprolol 50 mg daily and not sure where the discrepancy of bid came from. Med list reflects daily. Patient will f/u as planned later this month with RD. Patient voiced appreciation for the call.

## 2021-01-21 NOTE — Progress Notes (Deleted)
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  INTERVAL HISTORY: Patient returns to clinic today for further evaluation and consideration of cycle 1 of adjuvant cisplatin and Taxotere.  She continues to have mild surgical site tenderness, but otherwise feels well.  She has no neurologic complaints.  She denies any recent fevers.  She has a fair appetite, but denies weight loss.  She has no chest pain, shortness of breath, cough, or hemoptysis.  She denies any nausea, vomiting, constipation, or diarrhea.  She has no urinary complaints.  Patient offers no further specific complaints today.  REVIEW OF SYSTEMS:   Review of Systems  Constitutional: Negative.  Negative for fever, malaise/fatigue and weight loss.  Respiratory:  Negative for cough, hemoptysis and shortness of breath.   Cardiovascular: Negative.  Negative for chest pain and leg swelling.  Gastrointestinal: Negative.  Negative for abdominal pain.  Genitourinary: Negative.  Negative for dysuria.  Musculoskeletal: Negative.  Negative for back pain.  Skin: Negative.  Negative for rash.  Neurological: Negative.  Negative for dizziness, focal weakness, weakness and headaches.  Psychiatric/Behavioral:  The patient is nervous/anxious.    As per HPI. Otherwise, a complete review of systems is negative.  PAST MEDICAL HISTORY: Past Medical History:  Diagnosis Date   Arthritis    Breast cancer ([place]) 2011   radiation- Left   CHF (congestive heart failure) (HCC)    Coronary artery disease    Dyspnea    Hyperlipidemia    Hypertension    Myocardial infarction  [facility])    Personal history of radiation therapy    PVD (peripheral vascular disease) ([place])     PAST SURGICAL HISTORY: Past Surgical History:  Procedure Laterality Date   ABDOMINAL HYSTERECTOMY     APPENDECTOMY     BREAST BIOPSY Left 12/21/2009   positive, radiation dcis   BREAST EXCISIONAL BIOPSY Left 01/17/2010   lumpectomy DCIS   BREAST LUMPECTOMY     CATARACT EXTRACTION W/ INTRAOCULAR LENS IMPLANT & ANTERIOR VITRECTOMY, BILATERAL     CHOLECYSTECTOMY     COLONOSCOPY WITH PROPOFOL N/A 09/25/2017   Procedure: COLONOSCOPY WITH PROPOFOL;  Surgeon: Lollie Sails, MD;  Location: [facility] ENDOSCOPY;  Service: Endoscopy;  Laterality: N/A;   CORONARY ARTERY BYPASS GRAFT N/A 05/03/2020   Procedure: CORONARY ARTERY BYPASS GRAFTING (CABG) X FOUR ON PUMP USING LEFT INTERNAL MAMMARY ARTERY AND RIGHT ENDOSCOPIC SAPHEOUS VEIN HARVEST CONDUITS;  Surgeon: Melrose Nakayama, MD;  Location: [place];  Service: Open Heart Surgery;  Laterality: N/A;   DILATION AND CURETTAGE OF UTERUS     EYE SURGERY     bilateral cataracts   INTERCOSTAL NERVE BLOCK Left 11/13/2020   Procedure: INTERCOSTAL NERVE BLOCK;  Surgeon: Melrose Nakayama, MD;  Location: [place];  Service: Thoracic;  Laterality: Left;   IR IMAGING GUIDED PORT INSERTION  01/12/2021   JOINT REPLACEMENT Right 01/24/2016   KNEE ARTHROPLASTY Right 01/24/2016   Procedure: COMPUTER ASSISTED TOTAL KNEE ARTHROPLASTY;  Surgeon: Dereck Leep, MD;  Location: [facility] ORS;  Service: Orthopedics;  Laterality: Right;   LEFT HEART CATH AND CORONARY ANGIOGRAPHY N/A 05/01/2020   Procedure: LEFT HEART CATH AND CORONARY ANGIOGRAPHY;  Surgeon:  Wellington Hampshire, MD;  Location: [place] CV LAB;  Service: Cardiovascular;  Laterality: N/A;   LUNG LOBECTOMY  10/2020   LYMPH NODE DISSECTION Left 11/13/2020   Procedure: LYMPH NODE DISSECTION;  Surgeon: Melrose Nakayama, MD;  Location: [facility] OR;  Service: Thoracic;  Laterality: Left;   RIGHT/LEFT HEART CATH AND  CORONARY/GRAFT ANGIOGRAPHY N/A 07/27/2020   Procedure: RIGHT/LEFT HEART CATH AND CORONARY/GRAFT ANGIOGRAPHY;  Surgeon: Wellington Hampshire, MD;  Location: [place] CV LAB;  Service: Cardiovascular;  Laterality: N/A;   stent in Lt leg Left    TEE WITHOUT CARDIOVERSION N/A 05/03/2020   Procedure: TRANSESOPHAGEAL ECHOCARDIOGRAM (TEE);  Surgeon: Melrose Nakayama, MD;  Location: [place];  Service: Open Heart Surgery;  Laterality: N/A;   VIDEO BRONCHOSCOPY WITH ENDOBRONCHIAL NAVIGATION N/A 09/25/2020   Procedure: VIDEO BRONCHOSCOPY WITH ENDOBRONCHIAL NAVIGATION;  Surgeon: Melrose Nakayama, MD;  Location: [facility] OR;  Service: Thoracic;  Laterality: N/A;    FAMILY HISTORY: Family History  Problem Relation Age of Onset   Hypertension Mother    Arthritis Mother    Gout Mother    Lung cancer Father    Heart attack Sister     ADVANCED DIRECTIVES (Y/N):  N  HEALTH MAINTENANCE: Social History   Tobacco Use   Smoking status: Former   Smokeless tobacco: Never  Scientific laboratory technician Use: Never used  Substance Use Topics   Alcohol use: No    Alcohol/week: 0.0 standard drinks   Drug use: No     Colonoscopy:  PAP:  Bone density:  Lipid panel:  Allergies  Allergen Reactions   Ibuprofen Hives   Penicillins Hives    Tolerated Ancef in the past   Aleve [Naproxen Sodium] Hives   Avapro  [Irbesartan] Rash   Azithromycin Hives   Nickel Itching    Redness, itching and fluid discharge with nickel earrings    Current Outpatient Medications  Medication Sig Dispense Refill   acetaminophen (TYLENOL) 500 MG tablet Take 1,000 mg by mouth every 6 (six) hours as needed for moderate pain or headache.     aspirin EC 81 MG tablet Take 81 mg by mouth daily.     atorvastatin (LIPITOR) 20 MG tablet Take 1 tablet (20 mg total) by mouth at bedtime. 90 tablet 3   Calcium Carbonate-Vit D-Min (CALCIUM 600+D3 PLUS MINERALS) 600-800 MG-UNIT TABS Take 1 tablet by mouth daily.      chlorpheniramine-HYDROcodone (TUSSIONEX) 10-8 MG/5ML SUER Take 5 mLs by mouth every 12 (twelve) hours as needed for cough.     clopidogrel (PLAVIX) 75 MG tablet Take 1 tablet (75 mg total) by mouth daily. 90 tablet 3   diphenhydrAMINE-zinc acetate (BENADRYL) cream Apply 1 application topically daily as needed for itching.     furosemide (LASIX) 40 MG tablet Take 1 tablet (40 mg total) by mouth daily. 30 tablet 0   gabapentin (NEURONTIN) 100 MG capsule Take 1 capsule (100 mg total) by mouth 3 (three) times daily. 90 capsule 3   lidocaine-prilocaine (EMLA) cream Apply 1 application topically as needed. Apply to port and cover with saran wrap 1-2 hours prior to port access 30 g 1   metoprolol succinate (TOPROL-XL) 50 MG 24 hr tablet Take 1 tablet (50 mg total) by mouth daily. Take with or immediately following a meal. 90 tablet 0   Multiple Vitamin (MULTIVITAMIN WITH MINERALS) TABS tablet Take 1 tablet by mouth daily.     nitroGLYCERIN (NITROSTAT) 0.4 MG SL tablet Place 1 tablet (0.4  mg total) under the tongue every 5 (five) minutes as needed for up to 10 days for chest pain. 10 tablet 0   ondansetron (ZOFRAN) 8 MG tablet Take 1 tablet (8 mg total) by mouth 2 (two) times daily as needed (Nausea or vomiting). 60 tablet 1   oxyCODONE (OXY IR/ROXICODONE) 5 MG immediate release tablet Take 1 tablet (5 mg total) by mouth 3 (three) times daily as needed for severe pain. 15 tablet 0   potassium chloride SA (KLOR-CON) 10 MEQ tablet Take 1 tablet (10 mEq total) by mouth daily. 30 tablet 0   prochlorperazine (COMPAZINE) 10 MG tablet Take 1 tablet (10 mg total) by mouth every 6 (six) hours as needed (Nausea or vomiting). 60 tablet 1   sacubitril-valsartan (ENTRESTO) 24-26 MG Take 1 tablet by mouth 2 (two) times daily. 60 tablet 0   No current facility-administered medications for this visit.    OBJECTIVE: There were no vitals filed for this visit.     There is no height or weight on file to calculate BMI.     ECOG FS:0 - Asymptomatic  General: Well-developed, well-nourished, no acute distress. Eyes: Pink conjunctiva, anicteric sclera. HEENT: Normocephalic, moist mucous membranes. Lungs: No audible wheezing or coughing. Heart: Regular rate and rhythm. Abdomen: Soft, nontender, no obvious distention. Musculoskeletal: No edema, cyanosis, or clubbing. Neuro: Alert, answering all questions appropriately. Cranial nerves grossly intact. Skin: No rashes or petechiae noted. Psych: Normal affect.   LAB RESULTS:  Lab Results  Component Value Date   NA 134 (L) 01/17/2021   K 3.0 (L) 01/17/2021   CL 101 01/17/2021   CO2 25 01/17/2021   GLUCOSE 140 (H) 01/17/2021   BUN 11 01/17/2021   CREATININE 0.86 01/17/2021   CALCIUM 8.7 (L) 01/17/2021   PROT 6.9 01/17/2021   ALBUMIN 3.4 (L) 01/17/2021   AST 24 01/17/2021   ALT 11 01/17/2021   ALKPHOS 88 01/17/2021   BILITOT 0.9 01/17/2021   GFRNONAA >60 01/17/2021   GFRAA >60 01/26/2016    Lab Results  Component Value Date   WBC 4.3 01/17/2021   NEUTROABS 2.7 01/17/2021   HGB 10.8 (L) 01/17/2021   HCT 32.3 (L) 01/17/2021   MCV 96.1 01/17/2021   PLT 180 01/17/2021     STUDIES: DG Chest 2 View  Result Date: 01/02/2021 CLINICAL DATA:  Chest pain EXAM: CHEST - 2 VIEW COMPARISON:  Chest x-ray 12/12/2020 FINDINGS: Cardiomediastinal silhouette is unchanged. Mild cardiomegaly. Calcified plaques in the aortic arch. Cardiac surgical changes and median sternotomy wires. Surgical changes in the posterior left chest from previous mass resection. There is persistent and stable small left pleural effusion with associated atelectasis at the left lung base. No pneumothorax. IMPRESSION: 1. Stable small left pleural effusion with associated atelectasis. 2. Cardiomegaly and postsurgical changes. Electronically Signed   By: Ofilia Neas M.D.   On: 01/02/2021 09:16   IR IMAGING GUIDED PORT INSERTION  Result Date: 01/12/2021 INDICATION: Chemotherapy, lung  cancer EXAM: 1. Ultrasound-guided puncture of the right internal jugular vein 2. Placement of a right-sided chest port using fluoroscopic guidance MEDICATIONS: None ANESTHESIA/SEDATION: Moderate (conscious) sedation was employed during this procedure. A total of Versed 2 mg and Fentanyl 100 mcg was administered intravenously. Moderate Sedation Time: 20 minutes. The patient's level of consciousness and vital signs were monitored continuously by radiology nursing throughout the procedure under my direct supervision. FLUOROSCOPY TIME:  Fluoroscopy Time: 0.3 minutes with 3 exposures COMPLICATIONS: None immediate. PROCEDURE: Informed written consent was obtained from the patient  after a thorough discussion of the procedural risks, benefits and alternatives. All questions were addressed. Maximal Sterile Barrier Technique was utilized including caps, mask, sterile gowns, sterile gloves, sterile drape, hand hygiene and skin antiseptic. A timeout was performed prior to the initiation of the procedure. The patient was placed supine on the exam table. The right neck and chest was prepped and draped in the standard sterile fashion. A preliminary ultrasound of the right neck was performed and demonstrates a patent right internal jugular vein. A permanent ultrasound image was stored in the electronic medical record. The overlying skin was anesthetized with 1% Lidocaine. Using ultrasound guidance, access was obtained into the right internal jugular vein using a 21 gauge micropuncture set. A wire was advanced into the SVC, a short incision was made at the puncture site, and serial dilatation performed. Next, in an ipsilateral infraclavicular location, an incision was made at the site of the subcutaneous reservoir. Blunt dissection was used to open a pocket to contain the reservoir. A subcutaneous tunnel was then created from the port site to the puncture site. A(n) 8 Fr single lumen catheter was advanced through the tunnel. The  catheter was attached to the port and this was placed in the subcutaneous pocket. Under fluoroscopic guidance, a peel away sheath was placed, and the catheter was trimmed to the appropriate length and was advanced into the central veins. The catheter length is 22 cm. The tip of the catheter lies near the superior cavoatrial junction. The port flushes and aspirates appropriately. The port was flushed and locked with heparinized saline. The port pocket was closed in 2 layers using 3-0 and 4-0 Vicryl/absorbable suture. Dermabond was also applied to both incisions. The patient tolerated the procedure well and was transferred to recovery in stable condition. IMPRESSION: Successful placement of a right chest port via the right internal jugular vein. The port is ready for immediate use. Electronically Signed   By: Albin Felling M.D.   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  MRI of the brain on October 19, 2020 was negative for disease.  Given her stage, patient will benefit from adjuvant chemotherapy using cisplatin and Taxotere every 3 weeks for 4 cycles with Udenyca support.  Patient has had port placement.  Proceed with treatment today.  Return to clinic in 1 week for laboratory work and further evaluation and then in 3 weeks for laboratory work, further evaluation, and consideration of cycle 2 of 4.    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  Continue yearly screening mammograms. Cardiac disease: Continue follow-up with cardiology as scheduled. Anemia: Mild.  Patient's hemoglobin is 10.8 today.  Monitor. Hypokalemia: Patient was given dietary changes.  She will also receive IV potassium along with her cisplatin. Hypomagnesia: Patient will  receive 2 g IV magnesium along with cisplatin today.   Patient expressed understanding and was in agreement with this plan. She also understands that She can call clinic at any time with any questions, concerns, or complaints.    Cancer Staging  Primary squamous cell carcinoma of lower lobe of left lung (HCC) Staging form: Lung, AJCC 8th Edition - Pathologic stage from 12/21/2020: Stage IIIB (ypT3, pN2, cM0) - Signed by Lloyd Huger, MD on  12/21/2020 Stage prefix: Post-therapy   Lloyd Huger, MD   01/21/2021 8:41 AM

## 2021-01-23 ENCOUNTER — Telehealth: Payer: Self-pay | Admitting: Oncology

## 2021-01-23 NOTE — Telephone Encounter (Signed)
Pt called to cancel her appt for 1-11.does not want to reschedule right now. She will call when she is ready.

## 2021-01-24 ENCOUNTER — Inpatient Hospital Stay: Payer: Medicare Other

## 2021-01-24 ENCOUNTER — Encounter: Payer: Self-pay | Admitting: *Deleted

## 2021-01-24 ENCOUNTER — Inpatient Hospital Stay (HOSPITAL_BASED_OUTPATIENT_CLINIC_OR_DEPARTMENT_OTHER): Payer: Medicare Other | Admitting: Oncology

## 2021-01-24 ENCOUNTER — Inpatient Hospital Stay: Payer: Medicare Other | Admitting: Oncology

## 2021-01-24 ENCOUNTER — Other Ambulatory Visit: Payer: Self-pay

## 2021-01-24 ENCOUNTER — Inpatient Hospital Stay: Payer: Medicare Other | Admitting: Hospice and Palliative Medicine

## 2021-01-24 VITALS — BP 110/50

## 2021-01-24 VITALS — BP 88/62 | HR 47 | Resp 18 | Wt 104.0 lb

## 2021-01-24 DIAGNOSIS — Z5111 Encounter for antineoplastic chemotherapy: Secondary | ICD-10-CM | POA: Diagnosis not present

## 2021-01-24 DIAGNOSIS — C3432 Malignant neoplasm of lower lobe, left bronchus or lung: Secondary | ICD-10-CM

## 2021-01-24 LAB — CBC WITH DIFFERENTIAL/PLATELET
Abs Immature Granulocytes: 0.02 10*3/uL (ref 0.00–0.07)
Basophils Absolute: 0 10*3/uL (ref 0.0–0.1)
Basophils Relative: 2 %
Eosinophils Absolute: 0 10*3/uL (ref 0.0–0.5)
Eosinophils Relative: 1 %
HCT: 35.4 % — ABNORMAL LOW (ref 36.0–46.0)
Hemoglobin: 12.6 g/dL (ref 12.0–15.0)
Immature Granulocytes: 1 %
Lymphocytes Relative: 58 %
Lymphs Abs: 1.3 10*3/uL (ref 0.7–4.0)
MCH: 32 pg (ref 26.0–34.0)
MCHC: 35.6 g/dL (ref 30.0–36.0)
MCV: 89.8 fL (ref 80.0–100.0)
Monocytes Absolute: 0.1 10*3/uL (ref 0.1–1.0)
Monocytes Relative: 4 %
Neutro Abs: 0.8 10*3/uL — ABNORMAL LOW (ref 1.7–7.7)
Neutrophils Relative %: 34 %
Platelets: 172 10*3/uL (ref 150–400)
RBC: 3.94 MIL/uL (ref 3.87–5.11)
RDW: 12.4 % (ref 11.5–15.5)
Smear Review: NORMAL
WBC: 2.3 10*3/uL — ABNORMAL LOW (ref 4.0–10.5)
nRBC: 0 % (ref 0.0–0.2)

## 2021-01-24 LAB — COMPREHENSIVE METABOLIC PANEL
ALT: 15 U/L (ref 0–44)
AST: 26 U/L (ref 15–41)
Albumin: 4 g/dL (ref 3.5–5.0)
Alkaline Phosphatase: 77 U/L (ref 38–126)
Anion gap: 13 (ref 5–15)
BUN: 42 mg/dL — ABNORMAL HIGH (ref 8–23)
CO2: 27 mmol/L (ref 22–32)
Calcium: 9 mg/dL (ref 8.9–10.3)
Chloride: 85 mmol/L — ABNORMAL LOW (ref 98–111)
Creatinine, Ser: 1.56 mg/dL — ABNORMAL HIGH (ref 0.44–1.00)
GFR, Estimated: 34 mL/min — ABNORMAL LOW (ref >60)
Glucose, Bld: 104 mg/dL — ABNORMAL HIGH (ref 70–99)
Potassium: 3.7 mmol/L (ref 3.5–5.1)
Sodium: 125 mmol/L — ABNORMAL LOW (ref 135–145)
Total Bilirubin: 1.1 mg/dL (ref 0.3–1.2)
Total Protein: 7.4 g/dL (ref 6.5–8.1)

## 2021-01-24 LAB — MAGNESIUM: Magnesium: 1.6 mg/dL — ABNORMAL LOW (ref 1.7–2.4)

## 2021-01-24 MED ORDER — MAGNESIUM SULFATE 2 GM/50ML IV SOLN
2.0000 g | Freq: Once | INTRAVENOUS | Status: AC
  Administered 2021-01-24: 2 g via INTRAVENOUS
  Filled 2021-01-24: qty 50

## 2021-01-24 MED ORDER — SODIUM CHLORIDE 0.9 % IV SOLN
Freq: Once | INTRAVENOUS | Status: AC
  Filled 2021-01-24: qty 250

## 2021-01-24 MED ORDER — SODIUM CHLORIDE 0.9% FLUSH
10.0000 mL | Freq: Once | INTRAVENOUS | Status: DC | PRN
  Filled 2021-01-24: qty 10

## 2021-01-24 MED ORDER — DEXAMETHASONE SODIUM PHOSPHATE 10 MG/ML IJ SOLN
10.0000 mg | Freq: Once | INTRAMUSCULAR | Status: AC
  Administered 2021-01-24: 10 mg via INTRAVENOUS
  Filled 2021-01-24: qty 1

## 2021-01-24 MED ORDER — SODIUM CHLORIDE 0.9 % IV SOLN: Freq: Once | INTRAVENOUS | Status: DC

## 2021-01-24 MED ORDER — ONDANSETRON HCL 4 MG/2ML IJ SOLN
8.0000 mg | Freq: Once | INTRAMUSCULAR | Status: AC
  Administered 2021-01-24: 8 mg via INTRAVENOUS
  Filled 2021-01-24: qty 4

## 2021-01-24 MED ORDER — HEPARIN SOD (PORK) LOCK FLUSH 100 UNIT/ML IV SOLN
500.0000 [IU] | Freq: Once | INTRAVENOUS | Status: AC | PRN
  Administered 2021-01-24: 500 [IU]
  Filled 2021-01-24: qty 5

## 2021-01-24 NOTE — Progress Notes (Signed)
Met with patient during follow up visit with Dr. Finnegan after receiving first cycle of chemo last week. Pt was not feeling well today due to treatment related side effects. All questions answered during visit. Informed that will receive IV fluids and other medications to hopefully help her feel better. Nothing further needed at this time. Instructed pt to call with any questions or needs.  °

## 2021-01-24 NOTE — Progress Notes (Signed)
Patient reports feeling fatigued, weak, SOB on exertion, no appetite, nausea and vomiting, not sleeping well, and diarrhea since her last chemo treatment. She says that zofran helps some but she has not taken compazine. She has rash on the right chest area. She did not take her lasix today. States "I don't feel like me"

## 2021-01-24 NOTE — Progress Notes (Signed)
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  INTERVAL HISTORY: Patient returns to clinic today for further evaluation, laboratory work, and to assess her toleration of cycle 1 of cisplatin and Taxotere.  Since treatment, patient has had significant weakness and fatigue, nausea, and poor appetite.  She also has mild diarrhea.  She has no neurologic complaints.  She denies any recent fevers.  She has no chest pain, shortness of breath, cough, or hemoptysis. She has no urinary complaints.  Patient feels generally terrible, but offers no further specific complaints today.  REVIEW OF SYSTEMS:   Review of Systems  Constitutional:  Positive for malaise/fatigue. Negative for fever and weight loss.  Respiratory:  Negative for cough, hemoptysis and shortness of breath.   Cardiovascular: Negative.  Negative for chest pain and leg swelling.  Gastrointestinal:  Positive for diarrhea and nausea. Negative for abdominal pain.  Genitourinary: Negative.  Negative for dysuria.  Musculoskeletal: Negative.  Negative for back pain.  Skin: Negative.  Negative for rash.  Neurological:  Positive for weakness. Negative for dizziness, focal weakness and headaches.  Psychiatric/Behavioral:  The patient is not nervous/anxious.    As per HPI. Otherwise, a complete review of systems is negative.  PAST MEDICAL HISTORY: Past Medical History:  Diagnosis Date   Arthritis    Breast cancer ([place]) 2011   radiation- Left   CHF (congestive heart failure) ([facility])    Coronary artery disease    Dyspnea    Hyperlipidemia    Hypertension    Myocardial  infarction [facility])    Personal history of radiation therapy    PVD (peripheral vascular disease) ([place])     PAST SURGICAL HISTORY: Past Surgical History:  Procedure Laterality Date   ABDOMINAL HYSTERECTOMY     APPENDECTOMY     BREAST BIOPSY Left 12/21/2009   positive, radiation dcis   BREAST EXCISIONAL BIOPSY Left 01/17/2010   lumpectomy DCIS   BREAST LUMPECTOMY     CATARACT EXTRACTION W/ INTRAOCULAR LENS IMPLANT & ANTERIOR VITRECTOMY, BILATERAL     CHOLECYSTECTOMY     COLONOSCOPY WITH PROPOFOL N/A 09/25/2017   Procedure: COLONOSCOPY WITH PROPOFOL;  Surgeon: Lollie Sails, MD;  Location: [facility] ENDOSCOPY;  Service: Endoscopy;  Laterality: N/A;   CORONARY ARTERY BYPASS GRAFT N/A 05/03/2020   Procedure: CORONARY ARTERY BYPASS GRAFTING (CABG) X FOUR ON PUMP USING LEFT INTERNAL MAMMARY ARTERY AND RIGHT ENDOSCOPIC SAPHEOUS VEIN HARVEST CONDUITS;  Surgeon: Melrose Nakayama, MD;  Location: [place];  Service: Open Heart Surgery;  Laterality: N/A;   DILATION AND CURETTAGE OF UTERUS     EYE SURGERY     bilateral cataracts   INTERCOSTAL NERVE BLOCK Left 11/13/2020   Procedure: INTERCOSTAL NERVE BLOCK;  Surgeon: Melrose Nakayama, MD;  Location: [place];  Service: Thoracic;  Laterality: Left;   IR IMAGING GUIDED PORT INSERTION  01/12/2021   JOINT REPLACEMENT Right 01/24/2016   KNEE ARTHROPLASTY Right 01/24/2016   Procedure: COMPUTER ASSISTED TOTAL KNEE ARTHROPLASTY;  Surgeon: Dereck Leep, MD;  Location: [facility] ORS;  Service: Orthopedics;  Laterality: Right;   LEFT HEART CATH AND CORONARY ANGIOGRAPHY N/A 05/01/2020   Procedure: LEFT HEART CATH AND CORONARY ANGIOGRAPHY;  Surgeon: Wellington Hampshire, MD;  Location: [place] CV LAB;  Service: Cardiovascular;  Laterality: N/A;   LUNG LOBECTOMY  10/2020   LYMPH NODE DISSECTION Left 11/13/2020   Procedure: LYMPH NODE DISSECTION;  Surgeon: Melrose Nakayama, MD;  Location: [facility] OR;  Service: Thoracic;  Laterality: Left;   RIGHT/LEFT HEART  CATH AND CORONARY/GRAFT ANGIOGRAPHY N/A 07/27/2020   Procedure: RIGHT/LEFT HEART CATH AND CORONARY/GRAFT ANGIOGRAPHY;  Surgeon: Wellington Hampshire, MD;  Location: [place] CV LAB;  Service: Cardiovascular;  Laterality: N/A;   stent in Lt leg Left    TEE WITHOUT CARDIOVERSION N/A 05/03/2020   Procedure: TRANSESOPHAGEAL ECHOCARDIOGRAM (TEE);  Surgeon: Melrose Nakayama, MD;  Location: [place];  Service: Open Heart Surgery;  Laterality: N/A;   VIDEO BRONCHOSCOPY WITH ENDOBRONCHIAL NAVIGATION N/A 09/25/2020   Procedure: VIDEO BRONCHOSCOPY WITH ENDOBRONCHIAL NAVIGATION;  Surgeon: Melrose Nakayama, MD;  Location: [facility] OR;  Service: Thoracic;  Laterality: N/A;    FAMILY HISTORY: Family History  Problem Relation Age of Onset   Hypertension Mother    Arthritis Mother    Gout Mother    Lung cancer Father    Heart attack Sister     ADVANCED DIRECTIVES (Y/N):  N  HEALTH MAINTENANCE: Social History   Tobacco Use   Smoking status: Former   Smokeless tobacco: Never  Scientific laboratory technician Use: Never used  Substance Use Topics   Alcohol use: No    Alcohol/week: 0.0 standard drinks   Drug use: No     Colonoscopy:  PAP:  Bone density:  Lipid panel:  Allergies  Allergen Reactions   Ibuprofen Hives   Penicillins Hives    Tolerated Ancef in the past   Aleve [Naproxen Sodium] Hives   Avapro  [Irbesartan] Rash   Azithromycin Hives   Nickel Itching    Redness, itching and fluid discharge with nickel earrings    Current Outpatient Medications  Medication Sig Dispense Refill   acetaminophen (TYLENOL) 500 MG tablet Take 1,000 mg by mouth every 6 (six) hours as needed for moderate pain or headache.     aspirin EC 81 MG tablet Take 81 mg by mouth daily.     atorvastatin (LIPITOR) 20 MG tablet Take 1 tablet (20 mg total) by mouth at bedtime. 90 tablet 3   Calcium Carbonate-Vit D-Min (CALCIUM 600+D3 PLUS MINERALS) 600-800 MG-UNIT TABS Take 1 tablet by mouth daily.      chlorpheniramine-HYDROcodone (TUSSIONEX) 10-8 MG/5ML SUER Take 5 mLs by mouth every 12 (twelve) hours as needed for cough.     clopidogrel (PLAVIX) 75 MG tablet Take 1 tablet (75 mg total) by mouth daily. 90 tablet 3   diphenhydrAMINE-zinc acetate (BENADRYL) cream Apply 1 application topically daily as needed for itching.     furosemide (LASIX) 40 MG tablet Take 1 tablet (40 mg total) by mouth daily. 30 tablet 0   gabapentin (NEURONTIN) 100 MG capsule Take 1 capsule (100 mg total) by mouth 3 (three) times daily. 90 capsule 3   lidocaine-prilocaine (EMLA) cream Apply 1 application topically as needed. Apply to port and cover with saran wrap 1-2 hours prior to port access 30 g 1   metoprolol succinate (TOPROL-XL) 50 MG 24 hr tablet Take 1 tablet (50 mg total) by mouth daily. Take with or immediately following a meal. 90 tablet 0   Multiple Vitamin (MULTIVITAMIN WITH MINERALS) TABS tablet Take 1 tablet by mouth daily.     ondansetron (ZOFRAN) 8 MG tablet Take 1 tablet (8  mg total) by mouth 2 (two) times daily as needed (Nausea or vomiting). 60 tablet 1   oxyCODONE (OXY IR/ROXICODONE) 5 MG immediate release tablet Take 1 tablet (5 mg total) by mouth 3 (three) times daily as needed for severe pain. 15 tablet 0   potassium chloride SA (KLOR-CON) 10 MEQ tablet Take 1 tablet (10 mEq total) by mouth daily. 30 tablet 0   sacubitril-valsartan (ENTRESTO) 24-26 MG Take 1 tablet by mouth 2 (two) times daily. 60 tablet 0   nitroGLYCERIN (NITROSTAT) 0.4 MG SL tablet Place 1 tablet (0.4 mg total) under the tongue every 5 (five) minutes as needed for up to 10 days for chest pain. (Patient not taking: Reported on 01/24/2021) 10 tablet 0   prochlorperazine (COMPAZINE) 10 MG tablet Take 1 tablet (10 mg total) by mouth every 6 (six) hours as needed (Nausea or vomiting). (Patient not taking: Reported on 01/24/2021) 60 tablet 1   No current facility-administered medications for this visit.    OBJECTIVE: Vitals:   01/24/21  1149  BP: (!) 88/62  Pulse: (!) 47  Resp: 18  SpO2: 100%       Body mass index is 20.31 kg/m.    ECOG FS:0 - Asymptomatic  General: Thin, no acute distress. Eyes: Pink conjunctiva, anicteric sclera. HEENT: Normocephalic, moist mucous membranes. Lungs: No audible wheezing or coughing. Heart: Regular rate and rhythm. Abdomen: Soft, nontender, no obvious distention. Musculoskeletal: No edema, cyanosis, or clubbing. Neuro: Alert, answering all questions appropriately. Cranial nerves grossly intact. Skin: No rashes or petechiae noted. Psych: Normal affect.  LAB RESULTS:  Lab Results  Component Value Date   NA 125 (L) 01/24/2021   K 3.7 01/24/2021   CL 85 (L) 01/24/2021   CO2 27 01/24/2021   GLUCOSE 104 (H) 01/24/2021   BUN 42 (H) 01/24/2021   CREATININE 1.56 (H) 01/24/2021   CALCIUM 9.0 01/24/2021   PROT 7.4 01/24/2021   ALBUMIN 4.0 01/24/2021   AST 26 01/24/2021   ALT 15 01/24/2021   ALKPHOS 77 01/24/2021   BILITOT 1.1 01/24/2021   GFRNONAA 34 (L) 01/24/2021   GFRAA >60 01/26/2016    Lab Results  Component Value Date   WBC 2.3 (L) 01/24/2021   NEUTROABS 0.8 (L) 01/24/2021   HGB 12.6 01/24/2021   HCT 35.4 (L) 01/24/2021   MCV 89.8 01/24/2021   PLT 172 01/24/2021     STUDIES: DG Chest 2 View  Result Date: 01/02/2021 CLINICAL DATA:  Chest pain EXAM: CHEST - 2 VIEW COMPARISON:  Chest x-ray 12/12/2020 FINDINGS: Cardiomediastinal silhouette is unchanged. Mild cardiomegaly. Calcified plaques in the aortic arch. Cardiac surgical changes and median sternotomy wires. Surgical changes in the posterior left chest from previous mass resection. There is persistent and stable small left pleural effusion with associated atelectasis at the left lung base. No pneumothorax. IMPRESSION: 1. Stable small left pleural effusion with associated atelectasis. 2. Cardiomegaly and postsurgical changes. Electronically Signed   By: Ofilia Neas M.D.   On: 01/02/2021 09:16   IR IMAGING  GUIDED PORT INSERTION  Result Date: 01/12/2021 INDICATION: Chemotherapy, lung cancer EXAM: 1. Ultrasound-guided puncture of the right internal jugular vein 2. Placement of a right-sided chest port using fluoroscopic guidance MEDICATIONS: None ANESTHESIA/SEDATION: Moderate (conscious) sedation was employed during this procedure. A total of Versed 2 mg and Fentanyl 100 mcg was administered intravenously. Moderate Sedation Time: 20 minutes. The patient's level of consciousness and vital signs were monitored continuously by radiology nursing throughout the procedure under my direct supervision. FLUOROSCOPY  TIME:  Fluoroscopy Time: 0.3 minutes with 3 exposures COMPLICATIONS: None immediate. PROCEDURE: Informed written consent was obtained from the patient after a thorough discussion of the procedural risks, benefits and alternatives. All questions were addressed. Maximal Sterile Barrier Technique was utilized including caps, mask, sterile gowns, sterile gloves, sterile drape, hand hygiene and skin antiseptic. A timeout was performed prior to the initiation of the procedure. The patient was placed supine on the exam table. The right neck and chest was prepped and draped in the standard sterile fashion. A preliminary ultrasound of the right neck was performed and demonstrates a patent right internal jugular vein. A permanent ultrasound image was stored in the electronic medical record. The overlying skin was anesthetized with 1% Lidocaine. Using ultrasound guidance, access was obtained into the right internal jugular vein using a 21 gauge micropuncture set. A wire was advanced into the SVC, a short incision was made at the puncture site, and serial dilatation performed. Next, in an ipsilateral infraclavicular location, an incision was made at the site of the subcutaneous reservoir. Blunt dissection was used to open a pocket to contain the reservoir. A subcutaneous tunnel was then created from the port site to the  puncture site. A(n) 8 Fr single lumen catheter was advanced through the tunnel. The catheter was attached to the port and this was placed in the subcutaneous pocket. Under fluoroscopic guidance, a peel away sheath was placed, and the catheter was trimmed to the appropriate length and was advanced into the central veins. The catheter length is 22 cm. The tip of the catheter lies near the superior cavoatrial junction. The port flushes and aspirates appropriately. The port was flushed and locked with heparinized saline. The port pocket was closed in 2 layers using 3-0 and 4-0 Vicryl/absorbable suture. Dermabond was also applied to both incisions. The patient tolerated the procedure well and was transferred to recovery in stable condition. IMPRESSION: Successful placement of a right chest port via the right internal jugular vein. The port is ready for immediate use. Electronically Signed   By: Albin Felling M.D.   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  MRI of the brain on October 19, 2020 was negative for disease.  Given her stage, patient will benefit from adjuvant chemotherapy using cisplatin and Taxotere every 3 weeks for 4 cycles with Udenyca support.  Patient has had port placement.  Patient received cycle 1 of treatment last week and has had significant weakness and nausea since that time.  Proceed with IV fluids with Decadron and Zofran today and return to clinic Friday for a second infusion.  Patient will then return to clinic in 1 week for laboratory work, evaluation, and IV fluids and then in 2 weeks for further evaluation and consideration of cycle 2.   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  Continue yearly screening  mammograms. Cardiac disease: Continue follow-up with cardiology as scheduled. Anemia: Low patient's hemoglobin is within normal limits at 12.6, suspect some degree of hemoconcentration given her dehydration. Hypokalemia: Resolved. Hypomagnesia: Patient will receive 2 g IV magnesium today. Elevated creatinine/dehydration: Proceed with 1 L of IV fluids today and return to clinic Friday for second infusion.  Return to clinic in 1 week as above.  Nausea: Patient also received IV Decadron and Zofran today. Leukopenia: Mild, monitor. Hyponatremia: IV fluids as above.  Patient expressed understanding and was in agreement with this plan. She also understands that She can call clinic at any time with any questions, concerns, or complaints.    Cancer Staging  Primary squamous cell carcinoma of lower lobe of left lung [facility]) Staging form: Lung, AJCC 8th Edition - Pathologic stage from 12/21/2020: Stage IIIB (ypT3, pN2, cM0) - Signed by Lloyd Huger, MD on 12/21/2020 Stage prefix: Post-therapy   Lloyd Huger, MD   01/25/2021 3:53 PM

## 2021-01-25 ENCOUNTER — Encounter: Payer: Self-pay | Admitting: Oncology

## 2021-01-26 ENCOUNTER — Other Ambulatory Visit: Payer: Self-pay

## 2021-01-26 ENCOUNTER — Inpatient Hospital Stay: Payer: Medicare Other

## 2021-01-26 ENCOUNTER — Telehealth: Payer: Self-pay | Admitting: *Deleted

## 2021-01-26 ENCOUNTER — Other Ambulatory Visit: Payer: Self-pay | Admitting: *Deleted

## 2021-01-26 VITALS — BP 120/53 | HR 79

## 2021-01-26 DIAGNOSIS — C3432 Malignant neoplasm of lower lobe, left bronchus or lung: Secondary | ICD-10-CM

## 2021-01-26 DIAGNOSIS — R197 Diarrhea, unspecified: Secondary | ICD-10-CM

## 2021-01-26 DIAGNOSIS — Z5111 Encounter for antineoplastic chemotherapy: Secondary | ICD-10-CM | POA: Diagnosis not present

## 2021-01-26 MED ORDER — HEPARIN SOD (PORK) LOCK FLUSH 100 UNIT/ML IV SOLN
INTRAVENOUS | Status: AC
  Administered 2021-01-26: 500 [IU]
  Filled 2021-01-26: qty 5

## 2021-01-26 MED ORDER — SODIUM CHLORIDE 0.9 % IV SOLN
Freq: Once | INTRAVENOUS | Status: AC
  Filled 2021-01-26: qty 250

## 2021-01-26 MED ORDER — HEPARIN SOD (PORK) LOCK FLUSH 100 UNIT/ML IV SOLN
500.0000 [IU] | Freq: Once | INTRAVENOUS | Status: AC | PRN
  Filled 2021-01-26: qty 5

## 2021-01-26 MED ORDER — DIPHENOXYLATE-ATROPINE 2.5-0.025 MG PO TABS: 2.0000 | ORAL_TABLET | Freq: Four times a day (QID) | ORAL | 0 refills | Status: DC | PRN

## 2021-01-26 NOTE — Telephone Encounter (Signed)
Email received from pt's granddaughter asking if pt could get a stronger prescription to help with her diarrhea if it returns this weekend. States that the imodium didn't help very much to control the diarrhea. Per pt, her bowel movements are improving and returning to normal. Per Dr. Grayland Ormond, may send in prescription for lomotil to use over the weekend if diarrhea returns. Will hold off on stool cultures at this time since bowel movements are normalizing. Pt's granddaughter made aware of recommendations via email and instructed to let us know if she had any further questions or needs.

## 2021-01-26 NOTE — Patient Instructions (Signed)

## 2021-01-30 NOTE — Progress Notes (Signed)
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  INTERVAL HISTORY: Patient returns to clinic today for repeat laboratory work, further evaluation, consideration of additional IV fluids.  She feels significantly improved over the last week, but not back to her baseline.  She continues to have weakness and fatigue.  She no longer has nausea and her appetite is improved.  She has no neurologic complaints.  She denies any recent fevers.  She has no chest pain, shortness of breath, cough, or hemoptysis.  She denies any nausea, vomiting, constipation, or diarrhea.  She has no urinary complaints.  Patient offers no further specific complaints today.  REVIEW OF SYSTEMS:   Review of Systems  Constitutional:  Positive for malaise/fatigue. Negative for fever and weight loss.  Respiratory:  Negative for cough, hemoptysis and shortness of breath.   Cardiovascular: Negative.  Negative for chest pain and leg swelling.  Gastrointestinal: Negative.  Negative for abdominal pain, diarrhea and nausea.  Genitourinary: Negative.  Negative for dysuria.  Musculoskeletal: Negative.  Negative for back pain.  Skin: Negative.  Negative for rash.  Neurological:  Positive for weakness. Negative for dizziness, focal weakness and headaches.  Psychiatric/Behavioral:  The patient is not nervous/anxious.    As per HPI. Otherwise, a complete review of systems is negative.  PAST MEDICAL HISTORY: Past Medical History:  Diagnosis Date   Arthritis    Breast cancer ([place]) 2011   radiation- Left   CHF (congestive heart failure) ([facility])    Coronary artery  disease    Dyspnea    Hyperlipidemia    Hypertension    Myocardial infarction [facility])    Personal history of radiation therapy    PVD (peripheral vascular disease) ([place])     PAST SURGICAL HISTORY: Past Surgical History:  Procedure Laterality Date   ABDOMINAL HYSTERECTOMY     APPENDECTOMY     BREAST BIOPSY Left 12/21/2009   positive, radiation dcis   BREAST EXCISIONAL BIOPSY Left 01/17/2010   lumpectomy DCIS   BREAST LUMPECTOMY     CATARACT EXTRACTION W/ INTRAOCULAR LENS IMPLANT & ANTERIOR VITRECTOMY, BILATERAL     CHOLECYSTECTOMY     COLONOSCOPY WITH PROPOFOL N/A 09/25/2017   Procedure: COLONOSCOPY WITH PROPOFOL;  Surgeon: Lollie Sails, MD;  Location: [facility] ENDOSCOPY;  Service: Endoscopy;  Laterality: N/A;   CORONARY ARTERY BYPASS GRAFT N/A 05/03/2020   Procedure: CORONARY ARTERY BYPASS GRAFTING (CABG) X FOUR ON PUMP USING LEFT INTERNAL MAMMARY ARTERY AND RIGHT ENDOSCOPIC SAPHEOUS VEIN HARVEST CONDUITS;  Surgeon: [place] Nakayama, MD;  Location: [place];  Service: Open Heart Surgery;  Laterality: N/A;   DILATION AND CURETTAGE OF UTERUS     EYE SURGERY     bilateral cataracts   INTERCOSTAL NERVE BLOCK Left 11/13/2020   Procedure: INTERCOSTAL NERVE BLOCK;  Surgeon: [place] Nakayama, MD;  Location: [place];  Service: Thoracic;  Laterality: Left;   IR IMAGING GUIDED PORT INSERTION  01/12/2021   JOINT REPLACEMENT Right 01/24/2016   KNEE ARTHROPLASTY Right 01/24/2016   Procedure: COMPUTER ASSISTED TOTAL KNEE ARTHROPLASTY;  Surgeon: Dereck Leep, MD;  Location: [facility] ORS;  Service: Orthopedics;  Laterality: Right;   LEFT HEART CATH AND  CORONARY ANGIOGRAPHY N/A 05/01/2020   Procedure: LEFT HEART CATH AND CORONARY ANGIOGRAPHY;  Surgeon: [place] Hampshire, MD;  Location: [place] CV LAB;  Service: Cardiovascular;  Laterality: N/A;   LUNG LOBECTOMY  10/2020   LYMPH NODE DISSECTION Left 11/13/2020   Procedure: LYMPH NODE DISSECTION;  Surgeon: [place] Nakayama, MD;   [facility];  Service: Thoracic;  Laterality: Left;   RIGHT/LEFT HEART CATH AND CORONARY/GRAFT ANGIOGRAPHY N/A 07/27/2020   Procedure: RIGHT/LEFT HEART CATH AND CORONARY/GRAFT ANGIOGRAPHY;  Surgeon: Wellington Hampshire, MD;  Location: [place] CV LAB;  Service: Cardiovascular;  Laterality: N/A;   stent in Lt leg Left    TEE WITHOUT CARDIOVERSION N/A 05/03/2020   Procedure: TRANSESOPHAGEAL ECHOCARDIOGRAM (TEE);  Surgeon: [place] Nakayama, MD;  Location: [place];  Service: Open Heart Surgery;  Laterality: N/A;   VIDEO BRONCHOSCOPY WITH ENDOBRONCHIAL NAVIGATION N/A 09/25/2020   Procedure: VIDEO BRONCHOSCOPY WITH ENDOBRONCHIAL NAVIGATION;  Surgeon: [place] Nakayama, MD;  Location: [facility] OR;  Service: Thoracic;  Laterality: N/A;    FAMILY HISTORY: Family History  Problem Relation Age of Onset   Hypertension Mother    Arthritis Mother    Gout Mother    Lung cancer Father    Heart attack Sister     ADVANCED DIRECTIVES (Y/N):  N  HEALTH MAINTENANCE: Social History   Tobacco Use   Smoking status: Former   Smokeless tobacco: Never  Scientific laboratory technician Use: Never used  Substance Use Topics   Alcohol use: No    Alcohol/week: 0.0 standard drinks   Drug use: No     Colonoscopy:  PAP:  Bone density:  Lipid panel:  Allergies  Allergen Reactions   Ibuprofen Hives   Penicillins Hives    Tolerated Ancef in the past   Aleve [Naproxen Sodium] Hives   Avapro  [Irbesartan] Rash   Azithromycin Hives   Nickel Itching    Redness, itching and fluid discharge with nickel earrings    Current Outpatient Medications  Medication Sig Dispense Refill   acetaminophen (TYLENOL) 500 MG tablet Take 1,000 mg by mouth every 6 (six) hours as needed for moderate pain or headache.     aspirin EC 81 MG tablet Take 81 mg by mouth daily.     atorvastatin (LIPITOR) 20 MG tablet Take 1 tablet (20 mg total) by mouth at bedtime. 90 tablet 3   Calcium Carbonate-Vit D-Min (CALCIUM 600+D3 PLUS  MINERALS) 600-800 MG-UNIT TABS Take 1 tablet by mouth daily.     clopidogrel (PLAVIX) 75 MG tablet Take 1 tablet (75 mg total) by mouth daily. 90 tablet 3   furosemide (LASIX) 40 MG tablet Take 1 tablet (40 mg total) by mouth daily. 30 tablet 0   gabapentin (NEURONTIN) 100 MG capsule Take 1 capsule (100 mg total) by mouth 3 (three) times daily. 90 capsule 3   lidocaine-prilocaine (EMLA) cream Apply 1 application topically as needed. Apply to port and cover with saran wrap 1-2 hours prior to port access 30 g 1   metoprolol succinate (TOPROL-XL) 50 MG 24 hr tablet Take 1 tablet (50 mg total) by mouth daily. Take with or immediately following a meal. 90 tablet 0   Multiple Vitamin (MULTIVITAMIN WITH MINERALS) TABS tablet Take 1 tablet by mouth daily.     potassium chloride SA (KLOR-CON) 10 MEQ tablet Take 1 tablet (10 mEq total) by mouth daily. 30 tablet 0   sacubitril-valsartan (ENTRESTO) 24-26 MG Take 1 tablet by mouth 2 (two) times daily.  60 tablet 0   chlorpheniramine-HYDROcodone (TUSSIONEX) 10-8 MG/5ML SUER Take 5 mLs by mouth every 12 (twelve) hours as needed for cough. (Patient not taking: Reported on 01/31/2021)     diphenhydrAMINE-zinc acetate (BENADRYL) cream Apply 1 application topically daily as needed for itching. (Patient not taking: Reported on 01/31/2021)     diphenoxylate-atropine (LOMOTIL) 2.5-0.025 MG tablet Take 2 tablets by mouth 4 (four) times daily as needed for diarrhea or loose stools. (Patient not taking: Reported on 01/31/2021) 24 tablet 0   nitroGLYCERIN (NITROSTAT) 0.4 MG SL tablet Place 1 tablet (0.4 mg total) under the tongue every 5 (five) minutes as needed for up to 10 days for chest pain. (Patient not taking: Reported on 01/24/2021) 10 tablet 0   ondansetron (ZOFRAN) 8 MG tablet Take 1 tablet (8 mg total) by mouth 2 (two) times daily as needed (Nausea or vomiting). (Patient not taking: Reported on 01/31/2021) 60 tablet 1   oxyCODONE (OXY IR/ROXICODONE) 5 MG immediate release  tablet Take 1 tablet (5 mg total) by mouth 3 (three) times daily as needed for severe pain. (Patient not taking: Reported on 01/31/2021) 15 tablet 0   prochlorperazine (COMPAZINE) 10 MG tablet Take 1 tablet (10 mg total) by mouth every 6 (six) hours as needed (Nausea or vomiting). (Patient not taking: Reported on 01/24/2021) 60 tablet 1   No current facility-administered medications for this visit.    OBJECTIVE: Vitals:   01/31/21 0846  BP: (!) 122/58  Pulse: 80  Resp: 16  Temp: (!) 96.7 F (35.9 C)  SpO2: 100%       Body mass index is 21.68 kg/m.    ECOG FS:0 - Asymptomatic  General: Thin, no acute distress. Eyes: Pink conjunctiva, anicteric sclera. HEENT: Normocephalic, moist mucous membranes. Lungs: No audible wheezing or coughing. Heart: Regular rate and rhythm. Abdomen: Soft, nontender, no obvious distention. Musculoskeletal: No edema, cyanosis, or clubbing. Neuro: Alert, answering all questions appropriately. Cranial nerves grossly intact. Skin: No rashes or petechiae noted. Psych: Normal affect.   LAB RESULTS:  Lab Results  Component Value Date   NA 134 (L) 01/31/2021   K 3.0 (L) 01/31/2021   CL 97 (L) 01/31/2021   CO2 27 01/31/2021   GLUCOSE 104 (H) 01/31/2021   BUN 13 01/31/2021   CREATININE 1.09 (H) 01/31/2021   CALCIUM 7.9 (L) 01/31/2021   PROT 6.5 01/31/2021   ALBUMIN 3.5 01/31/2021   AST 22 01/31/2021   ALT 14 01/31/2021   ALKPHOS 76 01/31/2021   BILITOT 0.7 01/31/2021   GFRNONAA 52 (L) 01/31/2021   GFRAA >60 01/26/2016    Lab Results  Component Value Date   WBC 2.5 (L) 01/31/2021   NEUTROABS 0.3 (LL) 01/31/2021   HGB 10.1 (L) 01/31/2021   HCT 29.5 (L) 01/31/2021   MCV 92.5 01/31/2021   PLT 125 (L) 01/31/2021     STUDIES: DG Chest 2 View  Result Date: 01/02/2021 CLINICAL DATA:  Chest pain EXAM: CHEST - 2 VIEW COMPARISON:  Chest x-ray 12/12/2020 FINDINGS: Cardiomediastinal silhouette is unchanged. Mild cardiomegaly. Calcified plaques in  the aortic arch. Cardiac surgical changes and median sternotomy wires. Surgical changes in the posterior left chest from previous mass resection. There is persistent and stable small left pleural effusion with associated atelectasis at the left lung base. No pneumothorax. IMPRESSION: 1. Stable small left pleural effusion with associated atelectasis. 2. Cardiomegaly and postsurgical changes. Electronically Signed   By: Ofilia Neas M.D.   On: 01/02/2021 09:16   IR IMAGING GUIDED PORT  INSERTION  Result Date: 01/12/2021 INDICATION: Chemotherapy, lung cancer EXAM: 1. Ultrasound-guided puncture of the right internal jugular vein 2. Placement of a right-sided chest port using fluoroscopic guidance MEDICATIONS: None ANESTHESIA/SEDATION: Moderate (conscious) sedation was employed during this procedure. A total of Versed 2 mg and Fentanyl 100 mcg was administered intravenously. Moderate Sedation Time: 20 minutes. The patient's level of consciousness and vital signs were monitored continuously by radiology nursing throughout the procedure under my direct supervision. FLUOROSCOPY TIME:  Fluoroscopy Time: 0.3 minutes with 3 exposures COMPLICATIONS: None immediate. PROCEDURE: Informed written consent was obtained from the patient after a thorough discussion of the procedural risks, benefits and alternatives. All questions were addressed. Maximal Sterile Barrier Technique was utilized including caps, mask, sterile gowns, sterile gloves, sterile drape, hand hygiene and skin antiseptic. A timeout was performed prior to the initiation of the procedure. The patient was placed supine on the exam table. The right neck and chest was prepped and draped in the standard sterile fashion. A preliminary ultrasound of the right neck was performed and demonstrates a patent right internal jugular vein. A permanent ultrasound image was stored in the electronic medical record. The overlying skin was anesthetized with 1% Lidocaine. Using  ultrasound guidance, access was obtained into the right internal jugular vein using a 21 gauge micropuncture set. A wire was advanced into the SVC, a short incision was made at the puncture site, and serial dilatation performed. Next, in an ipsilateral infraclavicular location, an incision was made at the site of the subcutaneous reservoir. Blunt dissection was used to open a pocket to contain the reservoir. A subcutaneous tunnel was then created from the port site to the puncture site. A(n) 8 Fr single lumen catheter was advanced through the tunnel. The catheter was attached to the port and this was placed in the subcutaneous pocket. Under fluoroscopic guidance, a peel away sheath was placed, and the catheter was trimmed to the appropriate length and was advanced into the central veins. The catheter length is 22 cm. The tip of the catheter lies near the superior cavoatrial junction. The port flushes and aspirates appropriately. The port was flushed and locked with heparinized saline. The port pocket was closed in 2 layers using 3-0 and 4-0 Vicryl/absorbable suture. Dermabond was also applied to both incisions. The patient tolerated the procedure well and was transferred to recovery in stable condition. IMPRESSION: Successful placement of a right chest port via the right internal jugular vein. The port is ready for immediate use. Electronically Signed   By: Albin Felling M.D.   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  MRI of the brain on October 19, 2020 was negative for disease.  Given her stage, patient will benefit from adjuvant chemotherapy using cisplatin and Taxotere every 3 weeks for 4 cycles with Udenyca support.  Patient has had port placement.  Patient  received cycle 1 of treatment 2 weeks ago and had significant weakness and nausea since that time.  This is now resolved.  Return to clinic in 1 week for further evaluation and consideration of cycle 2.   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  Continue yearly screening mammograms. Cardiac disease: Continue follow-up with cardiology as scheduled. Anemia: Decreased.  Patient's hemoglobin  is 10.1 today. Hypokalemia: Patient will receive 40 mEq IV potassium today.  She was also instructed to take oral potassium supplementation. Hypomagnesia: Patient will receive 4 g IV magnesium today. Renal insufficiency: Resolved with IV fluids.   Nausea: Resolved.   Neutropenia: Secondary to chemotherapy.  Patient will require Udenyca with the remainder of the cycles. Hyponatremia: Significantly improved.  Patient expressed understanding and was in agreement with this plan. She also understands that She can call clinic at any time with any questions, concerns, or complaints.    Cancer Staging  Primary squamous cell carcinoma of lower lobe of left lung [facility]) Staging form: Lung, AJCC 8th Edition - Pathologic stage from 12/21/2020: Stage IIIB (ypT3, pN2, cM0) - Signed by Lloyd Huger, MD on 12/21/2020 Stage prefix: Post-therapy   Lloyd Huger, MD   02/01/2021 6:15 AM

## 2021-01-31 ENCOUNTER — Encounter: Payer: Self-pay | Admitting: Oncology

## 2021-01-31 ENCOUNTER — Encounter: Payer: Self-pay | Admitting: *Deleted

## 2021-01-31 ENCOUNTER — Other Ambulatory Visit: Payer: Self-pay

## 2021-01-31 ENCOUNTER — Inpatient Hospital Stay: Payer: Medicare Other

## 2021-01-31 ENCOUNTER — Inpatient Hospital Stay (HOSPITAL_BASED_OUTPATIENT_CLINIC_OR_DEPARTMENT_OTHER): Payer: Medicare Other | Admitting: Oncology

## 2021-01-31 VITALS — BP 122/58 | HR 80 | Temp 96.7°F | Resp 16 | Wt 111.0 lb

## 2021-01-31 DIAGNOSIS — Z5111 Encounter for antineoplastic chemotherapy: Secondary | ICD-10-CM | POA: Diagnosis not present

## 2021-01-31 DIAGNOSIS — C3432 Malignant neoplasm of lower lobe, left bronchus or lung: Secondary | ICD-10-CM | POA: Diagnosis not present

## 2021-01-31 LAB — CBC WITH DIFFERENTIAL/PLATELET
Abs Immature Granulocytes: 0 10*3/uL (ref 0.00–0.07)
Basophils Absolute: 0 10*3/uL (ref 0.0–0.1)
Basophils Relative: 1 %
Eosinophils Absolute: 0 10*3/uL (ref 0.0–0.5)
Eosinophils Relative: 0 %
HCT: 29.5 % — ABNORMAL LOW (ref 36.0–46.0)
Hemoglobin: 10.1 g/dL — ABNORMAL LOW (ref 12.0–15.0)
Immature Granulocytes: 0 %
Lymphocytes Relative: 60 %
Lymphs Abs: 1.5 10*3/uL (ref 0.7–4.0)
MCH: 31.7 pg (ref 26.0–34.0)
MCHC: 34.2 g/dL (ref 30.0–36.0)
MCV: 92.5 fL (ref 80.0–100.0)
Monocytes Absolute: 0.6 10*3/uL (ref 0.1–1.0)
Monocytes Relative: 25 %
Neutro Abs: 0.3 10*3/uL — CL (ref 1.7–7.7)
Neutrophils Relative %: 14 %
Platelets: 125 10*3/uL — ABNORMAL LOW (ref 150–400)
RBC: 3.19 MIL/uL — ABNORMAL LOW (ref 3.87–5.11)
RDW: 13 % (ref 11.5–15.5)
WBC: 2.5 10*3/uL — ABNORMAL LOW (ref 4.0–10.5)
nRBC: 0 % (ref 0.0–0.2)

## 2021-01-31 LAB — MAGNESIUM: Magnesium: 1 mg/dL — ABNORMAL LOW (ref 1.7–2.4)

## 2021-01-31 LAB — COMPREHENSIVE METABOLIC PANEL
ALT: 14 U/L (ref 0–44)
AST: 22 U/L (ref 15–41)
Albumin: 3.5 g/dL (ref 3.5–5.0)
Alkaline Phosphatase: 76 U/L (ref 38–126)
Anion gap: 10 (ref 5–15)
BUN: 13 mg/dL (ref 8–23)
CO2: 27 mmol/L (ref 22–32)
Calcium: 7.9 mg/dL — ABNORMAL LOW (ref 8.9–10.3)
Chloride: 97 mmol/L — ABNORMAL LOW (ref 98–111)
Creatinine, Ser: 1.09 mg/dL — ABNORMAL HIGH (ref 0.44–1.00)
GFR, Estimated: 52 mL/min — ABNORMAL LOW (ref >60)
Glucose, Bld: 104 mg/dL — ABNORMAL HIGH (ref 70–99)
Potassium: 3 mmol/L — ABNORMAL LOW (ref 3.5–5.1)
Sodium: 134 mmol/L — ABNORMAL LOW (ref 135–145)
Total Bilirubin: 0.7 mg/dL (ref 0.3–1.2)
Total Protein: 6.5 g/dL (ref 6.5–8.1)

## 2021-01-31 MED ORDER — HEPARIN SOD (PORK) LOCK FLUSH 100 UNIT/ML IV SOLN
INTRAVENOUS | Status: AC
  Filled 2021-01-31: qty 5

## 2021-01-31 MED ORDER — HEPARIN SOD (PORK) LOCK FLUSH 100 UNIT/ML IV SOLN
500.0000 [IU] | Freq: Once | INTRAVENOUS | Status: AC
  Administered 2021-01-31: 500 [IU] via INTRAVENOUS
  Filled 2021-01-31: qty 5

## 2021-01-31 MED ORDER — SODIUM CHLORIDE 0.9 % IV SOLN
Freq: Once | INTRAVENOUS | Status: AC
  Filled 2021-01-31: qty 20

## 2021-01-31 MED ORDER — SODIUM CHLORIDE 0.9 % IV SOLN
Freq: Once | INTRAVENOUS | Status: AC
  Filled 2021-01-31: qty 250

## 2021-01-31 NOTE — Progress Notes (Signed)
Pt in for follow up, denies any concerns today. 

## 2021-02-01 ENCOUNTER — Encounter: Payer: Self-pay | Admitting: Oncology

## 2021-02-01 NOTE — Progress Notes (Signed)
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  INTERVAL HISTORY: Patient returns to clinic today for further evaluation and consideration of cycle 2 of cisplatin and Taxotere.  She currently feels well and is nearly back to her baseline.  She does not complain of weakness or fatigue today.  She has a good appetite.  She has no neurologic complaints.  She denies any recent fevers.  She has no chest pain, shortness of breath, cough, or hemoptysis.  She denies any nausea, vomiting, constipation, or diarrhea.  She has no urinary complaints.  Patient offers no specific complaints today.  REVIEW OF SYSTEMS:   Review of Systems  Constitutional: Negative.  Negative for fever, malaise/fatigue and weight loss.  Respiratory:  Negative for cough, hemoptysis and shortness of breath.   Cardiovascular: Negative.  Negative for chest pain and leg swelling.  Gastrointestinal: Negative.  Negative for abdominal pain, diarrhea and nausea.  Genitourinary: Negative.  Negative for dysuria.  Musculoskeletal: Negative.  Negative for back pain.  Skin: Negative.  Negative for rash.  Neurological: Negative.  Negative for dizziness, focal weakness, weakness and headaches.  Psychiatric/Behavioral: Negative.  The patient is not nervous/anxious.    As per HPI. Otherwise, a complete review of systems is negative.  PAST MEDICAL HISTORY: Past Medical History:  Diagnosis Date   Arthritis    Breast cancer ([place]) 2011   radiation- Left   CHF (congestive heart failure) ([facility])    Coronary artery disease    Dyspnea    Hyperlipidemia    Hypertension     Myocardial infarction [facility])    Personal history of radiation therapy    PVD (peripheral vascular disease) ([place])     PAST SURGICAL HISTORY: Past Surgical History:  Procedure Laterality Date   ABDOMINAL HYSTERECTOMY     APPENDECTOMY     BREAST BIOPSY Left 12/21/2009   positive, radiation dcis   BREAST EXCISIONAL BIOPSY Left 01/17/2010   lumpectomy DCIS   BREAST LUMPECTOMY     CATARACT EXTRACTION W/ INTRAOCULAR LENS IMPLANT & ANTERIOR VITRECTOMY, BILATERAL     CHOLECYSTECTOMY     COLONOSCOPY WITH PROPOFOL N/A 09/25/2017   Procedure: COLONOSCOPY WITH PROPOFOL;  Surgeon: Lollie Sails, MD;  Location: [facility] ENDOSCOPY;  Service: Endoscopy;  Laterality: N/A;   CORONARY ARTERY BYPASS GRAFT N/A 05/03/2020   Procedure: CORONARY ARTERY BYPASS GRAFTING (CABG) X FOUR ON PUMP USING LEFT INTERNAL MAMMARY ARTERY AND RIGHT ENDOSCOPIC SAPHEOUS VEIN HARVEST CONDUITS;  Surgeon: Melrose Nakayama, MD;  Location: [place];  Service: Open Heart Surgery;  Laterality: N/A;   DILATION AND CURETTAGE OF UTERUS     EYE SURGERY     bilateral cataracts   INTERCOSTAL NERVE BLOCK Left 11/13/2020   Procedure: INTERCOSTAL NERVE BLOCK;  Surgeon: Melrose Nakayama, MD;  Location: [place];  Service: Thoracic;  Laterality: Left;   IR IMAGING GUIDED PORT INSERTION  01/12/2021   JOINT REPLACEMENT Right 01/24/2016   KNEE ARTHROPLASTY Right 01/24/2016   Procedure: COMPUTER ASSISTED TOTAL KNEE ARTHROPLASTY;  Surgeon: Dereck Leep, MD;  Location: [facility] ORS;  Service: Orthopedics;  Laterality: Right;   LEFT HEART CATH AND CORONARY ANGIOGRAPHY N/A 05/01/2020   Procedure:  LEFT HEART CATH AND CORONARY ANGIOGRAPHY;  Surgeon: Wellington Hampshire, MD;  Location: [place] CV LAB;  Service: Cardiovascular;  Laterality: N/A;   LUNG LOBECTOMY  10/2020   LYMPH NODE DISSECTION Left 11/13/2020   Procedure: LYMPH NODE DISSECTION;  Surgeon: Melrose Nakayama, MD;  Location: [facility] OR;  Service: Thoracic;  Laterality: Left;    RIGHT/LEFT HEART CATH AND CORONARY/GRAFT ANGIOGRAPHY N/A 07/27/2020   Procedure: RIGHT/LEFT HEART CATH AND CORONARY/GRAFT ANGIOGRAPHY;  Surgeon: Wellington Hampshire, MD;  Location: [place] CV LAB;  Service: Cardiovascular;  Laterality: N/A;   stent in Lt leg Left    TEE WITHOUT CARDIOVERSION N/A 05/03/2020   Procedure: TRANSESOPHAGEAL ECHOCARDIOGRAM (TEE);  Surgeon: Melrose Nakayama, MD;  Location: [place];  Service: Open Heart Surgery;  Laterality: N/A;   VIDEO BRONCHOSCOPY WITH ENDOBRONCHIAL NAVIGATION N/A 09/25/2020   Procedure: VIDEO BRONCHOSCOPY WITH ENDOBRONCHIAL NAVIGATION;  Surgeon: Melrose Nakayama, MD;  Location: [facility] OR;  Service: Thoracic;  Laterality: N/A;    FAMILY HISTORY: Family History  Problem Relation Age of Onset   Hypertension Mother    Arthritis Mother    Gout Mother    Lung cancer Father    Heart attack Sister     ADVANCED DIRECTIVES (Y/N):  N  HEALTH MAINTENANCE: Social History   Tobacco Use   Smoking status: Former   Smokeless tobacco: Never  Scientific laboratory technician Use: Never used  Substance Use Topics   Alcohol use: No    Alcohol/week: 0.0 standard drinks   Drug use: No     Colonoscopy:  PAP:  Bone density:  Lipid panel:  Allergies  Allergen Reactions   Ibuprofen Hives   Penicillins Hives    Tolerated Ancef in the past   Aleve [Naproxen Sodium] Hives   Avapro  [Irbesartan] Rash   Azithromycin Hives   Nickel Itching    Redness, itching and fluid discharge with nickel earrings    Current Outpatient Medications  Medication Sig Dispense Refill   acetaminophen (TYLENOL) 500 MG tablet Take 1,000 mg by mouth every 6 (six) hours as needed for moderate pain or headache.     aspirin EC 81 MG tablet Take 81 mg by mouth daily.     atorvastatin (LIPITOR) 20 MG tablet Take 1 tablet (20 mg total) by mouth at bedtime. 90 tablet 3   Calcium Carbonate-Vit D-Min (CALCIUM 600+D3 PLUS MINERALS) 600-800 MG-UNIT TABS Take 1 tablet by mouth daily.      clopidogrel (PLAVIX) 75 MG tablet Take 1 tablet (75 mg total) by mouth daily. 90 tablet 3   furosemide (LASIX) 40 MG tablet Take 1 tablet (40 mg total) by mouth daily. 30 tablet 0   gabapentin (NEURONTIN) 100 MG capsule Take 1 capsule (100 mg total) by mouth 3 (three) times daily. 90 capsule 3   lidocaine-prilocaine (EMLA) cream Apply 1 application topically as needed. Apply to port and cover with saran wrap 1-2 hours prior to port access 30 g 1   magnesium 30 MG tablet Take 30 mg by mouth 2 (two) times daily.     metoprolol succinate (TOPROL-XL) 50 MG 24 hr tablet Take 1 tablet (50 mg total) by mouth daily. Take with or immediately following a meal. 90 tablet 0   Multiple Vitamin (MULTIVITAMIN WITH MINERALS) TABS tablet Take 1 tablet by mouth daily.     sacubitril-valsartan (ENTRESTO) 24-26 MG Take 1 tablet by mouth 2 (two) times daily. 60 tablet 0   chlorpheniramine-HYDROcodone (TUSSIONEX) 10-8 MG/5ML SUER Take  5 mLs by mouth every 12 (twelve) hours as needed for cough. (Patient not taking: Reported on 01/31/2021)     diphenhydrAMINE-zinc acetate (BENADRYL) cream Apply 1 application topically daily as needed for itching. (Patient not taking: Reported on 01/31/2021)     diphenoxylate-atropine (LOMOTIL) 2.5-0.025 MG tablet Take 2 tablets by mouth 4 (four) times daily as needed for diarrhea or loose stools. (Patient not taking: Reported on 01/31/2021) 24 tablet 0   nitroGLYCERIN (NITROSTAT) 0.4 MG SL tablet Place 1 tablet (0.4 mg total) under the tongue every 5 (five) minutes as needed for up to 10 days for chest pain. (Patient not taking: Reported on 01/24/2021) 10 tablet 0   ondansetron (ZOFRAN) 8 MG tablet Take 1 tablet (8 mg total) by mouth 2 (two) times daily as needed (Nausea or vomiting). (Patient not taking: Reported on 01/31/2021) 60 tablet 1   oxyCODONE (OXY IR/ROXICODONE) 5 MG immediate release tablet Take 1 tablet (5 mg total) by mouth 3 (three) times daily as needed for severe pain. (Patient  not taking: Reported on 01/31/2021) 15 tablet 0   potassium chloride (KLOR-CON M) 10 MEQ tablet Take 2 tablets (20 mEq total) by mouth 2 (two) times daily. 120 tablet 2   prochlorperazine (COMPAZINE) 10 MG tablet Take 1 tablet (10 mg total) by mouth every 6 (six) hours as needed (Nausea or vomiting). (Patient not taking: Reported on 01/24/2021) 60 tablet 1   No current facility-administered medications for this visit.    OBJECTIVE: Vitals:   02/07/21 0842  BP: 137/60  Pulse: 93  Resp: 16  Temp: (!) 97.2 F (36.2 C)  SpO2: 100%       Body mass index is 21.48 kg/m.    ECOG FS:0 - Asymptomatic  General: Thin, no acute distress. Eyes: Pink conjunctiva, anicteric sclera. HEENT: Normocephalic, moist mucous membranes. Lungs: No audible wheezing or coughing. Heart: Regular rate and rhythm. Abdomen: Soft, nontender, no obvious distention. Musculoskeletal: No edema, cyanosis, or clubbing. Neuro: Alert, answering all questions appropriately. Cranial nerves grossly intact. Skin: No rashes or petechiae noted. Psych: Normal affect.  LAB RESULTS:  Lab Results  Component Value Date   NA 132 (L) 02/07/2021   K 2.8 (L) 02/07/2021   CL 93 (L) 02/07/2021   CO2 28 02/07/2021   GLUCOSE 128 (H) 02/07/2021   BUN 9 02/07/2021   CREATININE 0.91 02/07/2021   CALCIUM 8.6 (L) 02/07/2021   PROT 6.9 02/07/2021   ALBUMIN 3.5 02/07/2021   AST 24 02/07/2021   ALT 13 02/07/2021   ALKPHOS 87 02/07/2021   BILITOT 0.4 02/07/2021   GFRNONAA >60 02/07/2021   GFRAA >60 01/26/2016    Lab Results  Component Value Date   WBC 4.7 02/07/2021   NEUTROABS 3.1 02/07/2021   HGB 10.2 (L) 02/07/2021   HCT 29.5 (L) 02/07/2021   MCV 93.7 02/07/2021   PLT 192 02/07/2021     STUDIES: IR IMAGING GUIDED PORT INSERTION  Result Date: 01/12/2021 INDICATION: Chemotherapy, lung cancer EXAM: 1. Ultrasound-guided puncture of the right internal jugular vein 2. Placement of a right-sided chest port using  fluoroscopic guidance MEDICATIONS: None ANESTHESIA/SEDATION: Moderate (conscious) sedation was employed during this procedure. A total of Versed 2 mg and Fentanyl 100 mcg was administered intravenously. Moderate Sedation Time: 20 minutes. The patient's level of consciousness and vital signs were monitored continuously by radiology nursing throughout the procedure under my direct supervision. FLUOROSCOPY TIME:  Fluoroscopy Time: 0.3 minutes with 3 exposures COMPLICATIONS: None immediate. PROCEDURE: Informed written consent was obtained  from the patient after a thorough discussion of the procedural risks, benefits and alternatives. All questions were addressed. Maximal Sterile Barrier Technique was utilized including caps, mask, sterile gowns, sterile gloves, sterile drape, hand hygiene and skin antiseptic. A timeout was performed prior to the initiation of the procedure. The patient was placed supine on the exam table. The right neck and chest was prepped and draped in the standard sterile fashion. A preliminary ultrasound of the right neck was performed and demonstrates a patent right internal jugular vein. A permanent ultrasound image was stored in the electronic medical record. The overlying skin was anesthetized with 1% Lidocaine. Using ultrasound guidance, access was obtained into the right internal jugular vein using a 21 gauge micropuncture set. A wire was advanced into the SVC, a short incision was made at the puncture site, and serial dilatation performed. Next, in an ipsilateral infraclavicular location, an incision was made at the site of the subcutaneous reservoir. Blunt dissection was used to open a pocket to contain the reservoir. A subcutaneous tunnel was then created from the port site to the puncture site. A(n) 8 Fr single lumen catheter was advanced through the tunnel. The catheter was attached to the port and this was placed in the subcutaneous pocket. Under fluoroscopic guidance, a peel away  sheath was placed, and the catheter was trimmed to the appropriate length and was advanced into the central veins. The catheter length is 22 cm. The tip of the catheter lies near the superior cavoatrial junction. The port flushes and aspirates appropriately. The port was flushed and locked with heparinized saline. The port pocket was closed in 2 layers using 3-0 and 4-0 Vicryl/absorbable suture. Dermabond was also applied to both incisions. The patient tolerated the procedure well and was transferred to recovery in stable condition. IMPRESSION: Successful placement of a right chest port via the right internal jugular vein. The port is ready for immediate use. Electronically Signed   By: Albin Felling M.D.   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  MRI of the brain on October 19, 2020 was negative for disease.  Given her stage, patient will benefit from adjuvant chemotherapy using cisplatin and Taxotere every 3 weeks for 4 cycles with Udenyca support.  Patient has had port placement.  Patient had significant weakness, fatigue, nausea after cycle 1, but this is since resolved.  Proceed with cycle 2 of treatment today.  Will dose reduce Taxotere and cisplatin approximately 10%.  Return to clinic in 2 days for Udenyca and IV fluids.  Return to clinic in 1 week for repeat laboratory work and additional fluids and then in 3 weeks for further evaluation and consideration of cycle 3.   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  Continue yearly screening mammograms. Cardiac disease: Continue follow-up with cardiology as scheduled. Anemia: Chronic and unchanged.  Patient's hemoglobin is 10.2 today. Hypokalemia: Patient will receive 40 mEq IV  potassium today.  Increase oral potassium supplementation to 20 mEq twice per day. Hypomagnesia: Patient will receive 4 g IV magnesium today.  Continue oral magnesium supplementation. Renal insufficiency: Resolved with IV fluids.   Nausea: Resolved.   Neutropenia: Resolved.  Patient will require Udenyca with the remainder of the cycles. Hyponatremia: Mild,  monitor.    Patient expressed understanding and was in agreement with this plan. She also understands that She can call clinic at any time with any questions, concerns, or complaints.    Cancer Staging  Primary squamous cell carcinoma of lower lobe of left lung [facility]) Staging form: Lung, AJCC 8th Edition - Pathologic stage from 12/21/2020: Stage IIIB (ypT3, pN2, cM0) - Signed by Lloyd Huger, MD on 12/21/2020 Stage prefix: Post-therapy   Lloyd Huger, MD   02/07/2021 7:44 PM

## 2021-02-07 ENCOUNTER — Inpatient Hospital Stay: Payer: Medicare Other

## 2021-02-07 ENCOUNTER — Inpatient Hospital Stay (HOSPITAL_BASED_OUTPATIENT_CLINIC_OR_DEPARTMENT_OTHER): Payer: Medicare Other | Admitting: Oncology

## 2021-02-07 ENCOUNTER — Encounter: Payer: Self-pay | Admitting: *Deleted

## 2021-02-07 ENCOUNTER — Other Ambulatory Visit: Payer: Self-pay

## 2021-02-07 VITALS — BP 137/60 | HR 93 | Temp 97.2°F | Resp 16 | Wt 110.0 lb

## 2021-02-07 DIAGNOSIS — Z5111 Encounter for antineoplastic chemotherapy: Secondary | ICD-10-CM | POA: Diagnosis not present

## 2021-02-07 DIAGNOSIS — C3432 Malignant neoplasm of lower lobe, left bronchus or lung: Secondary | ICD-10-CM

## 2021-02-07 LAB — CBC WITH DIFFERENTIAL/PLATELET
Abs Immature Granulocytes: 0.01 10*3/uL (ref 0.00–0.07)
Basophils Absolute: 0 10*3/uL (ref 0.0–0.1)
Basophils Relative: 0 %
Eosinophils Absolute: 0 10*3/uL (ref 0.0–0.5)
Eosinophils Relative: 1 %
HCT: 29.5 % — ABNORMAL LOW (ref 36.0–46.0)
Hemoglobin: 10.2 g/dL — ABNORMAL LOW (ref 12.0–15.0)
Immature Granulocytes: 0 %
Lymphocytes Relative: 25 %
Lymphs Abs: 1.2 10*3/uL (ref 0.7–4.0)
MCH: 32.4 pg (ref 26.0–34.0)
MCHC: 34.6 g/dL (ref 30.0–36.0)
MCV: 93.7 fL (ref 80.0–100.0)
Monocytes Absolute: 0.4 10*3/uL (ref 0.1–1.0)
Monocytes Relative: 9 %
Neutro Abs: 3.1 10*3/uL (ref 1.7–7.7)
Neutrophils Relative %: 65 %
Platelets: 192 10*3/uL (ref 150–400)
RBC: 3.15 MIL/uL — ABNORMAL LOW (ref 3.87–5.11)
RDW: 12.9 % (ref 11.5–15.5)
WBC: 4.7 10*3/uL (ref 4.0–10.5)
nRBC: 0 % (ref 0.0–0.2)

## 2021-02-07 LAB — COMPREHENSIVE METABOLIC PANEL
ALT: 13 U/L (ref 0–44)
AST: 24 U/L (ref 15–41)
Albumin: 3.5 g/dL (ref 3.5–5.0)
Alkaline Phosphatase: 87 U/L (ref 38–126)
Anion gap: 11 (ref 5–15)
BUN: 9 mg/dL (ref 8–23)
CO2: 28 mmol/L (ref 22–32)
Calcium: 8.6 mg/dL — ABNORMAL LOW (ref 8.9–10.3)
Chloride: 93 mmol/L — ABNORMAL LOW (ref 98–111)
Creatinine, Ser: 0.91 mg/dL (ref 0.44–1.00)
GFR, Estimated: >60 mL/min (ref >60)
Glucose, Bld: 128 mg/dL — ABNORMAL HIGH (ref 70–99)
Potassium: 2.8 mmol/L — ABNORMAL LOW (ref 3.5–5.1)
Sodium: 132 mmol/L — ABNORMAL LOW (ref 135–145)
Total Bilirubin: 0.4 mg/dL (ref 0.3–1.2)
Total Protein: 6.9 g/dL (ref 6.5–8.1)

## 2021-02-07 LAB — MAGNESIUM: Magnesium: 1.4 mg/dL — ABNORMAL LOW (ref 1.7–2.4)

## 2021-02-07 MED ORDER — SODIUM CHLORIDE 0.9% FLUSH
10.0000 mL | Freq: Once | INTRAVENOUS | Status: AC
  Administered 2021-02-07: 09:00:00 10 mL via INTRAVENOUS
  Filled 2021-02-07: qty 10

## 2021-02-07 MED ORDER — POTASSIUM CHLORIDE 20 MEQ/100ML IV SOLN: 20.0000 meq | Freq: Once | INTRAVENOUS | Status: DC

## 2021-02-07 MED ORDER — HEPARIN SOD (PORK) LOCK FLUSH 100 UNIT/ML IV SOLN
INTRAVENOUS | Status: AC
  Administered 2021-02-07: 17:00:00 500 [IU]
  Filled 2021-02-07: qty 5

## 2021-02-07 MED ORDER — SODIUM CHLORIDE 0.9 % IV SOLN: 40.0000 meq | Freq: Once | INTRAVENOUS | Status: DC

## 2021-02-07 MED ORDER — SODIUM CHLORIDE 0.9 % IV SOLN
68.0000 mg/m2 | Freq: Once | INTRAVENOUS | Status: AC
  Administered 2021-02-07: 15:00:00 98 mg via INTRAVENOUS
  Filled 2021-02-07: qty 98

## 2021-02-07 MED ORDER — MAGNESIUM SULFATE 2 GM/50ML IV SOLN: 2.0000 g | Freq: Once | INTRAVENOUS | Status: DC

## 2021-02-07 MED ORDER — HEPARIN SOD (PORK) LOCK FLUSH 100 UNIT/ML IV SOLN
500.0000 [IU] | Freq: Once | INTRAVENOUS | Status: AC | PRN
  Filled 2021-02-07: qty 5

## 2021-02-07 MED ORDER — SODIUM CHLORIDE 0.9 % IV SOLN
68.0000 mg/m2 | Freq: Once | INTRAVENOUS | Status: AC
  Administered 2021-02-07: 13:00:00 100 mg via INTRAVENOUS
  Filled 2021-02-07: qty 10

## 2021-02-07 MED ORDER — MAGNESIUM SULFATE 4 GM/100ML IV SOLN
4.0000 g | Freq: Once | INTRAVENOUS | Status: AC
  Administered 2021-02-07: 10:00:00 4 g via INTRAVENOUS
  Filled 2021-02-07: qty 100

## 2021-02-07 MED ORDER — PALONOSETRON HCL INJECTION 0.25 MG/5ML
0.2500 mg | Freq: Once | INTRAVENOUS | Status: AC
  Administered 2021-02-07: 12:00:00 0.25 mg via INTRAVENOUS
  Filled 2021-02-07: qty 5

## 2021-02-07 MED ORDER — MAGNESIUM SULFATE 4 GM/100ML IV SOLN: 4.0000 g | Freq: Once | INTRAVENOUS | Status: DC

## 2021-02-07 MED ORDER — POTASSIUM CHLORIDE 20 MEQ/100ML IV SOLN
20.0000 meq | Freq: Once | INTRAVENOUS | Status: AC
  Administered 2021-02-07: 10:00:00 20 meq via INTRAVENOUS

## 2021-02-07 MED ORDER — SODIUM CHLORIDE 0.9 % IV SOLN
10.0000 mg | Freq: Once | INTRAVENOUS | Status: AC
  Administered 2021-02-07: 12:00:00 10 mg via INTRAVENOUS
  Filled 2021-02-07: qty 10

## 2021-02-07 MED ORDER — POTASSIUM CHLORIDE IN NACL 20-0.9 MEQ/L-% IV SOLN
Freq: Once | INTRAVENOUS | Status: AC
  Filled 2021-02-07: qty 1000

## 2021-02-07 MED ORDER — SODIUM CHLORIDE 0.9 % IV SOLN
150.0000 mg | Freq: Once | INTRAVENOUS | Status: AC
  Administered 2021-02-07: 12:00:00 150 mg via INTRAVENOUS
  Filled 2021-02-07: qty 150

## 2021-02-07 MED ORDER — POTASSIUM CHLORIDE CRYS ER 10 MEQ PO TBCR: 20.0000 meq | EXTENDED_RELEASE_TABLET | Freq: Two times a day (BID) | ORAL | 2 refills | Status: AC

## 2021-02-07 MED ORDER — SODIUM CHLORIDE 0.9 % IV SOLN
Freq: Once | INTRAVENOUS | Status: AC
  Filled 2021-02-07: qty 250

## 2021-02-07 NOTE — Progress Notes (Signed)
Nutrition Follow-up:  Patient with SCC of left lung.  Patient receiving adjuvant chemotherapy.    Met with patient during infusion.  Reports that her appetite is better.  Last night for dinner ate potato with butter and green beans.  Also ate cheese and crackers yesterday and few pieces of candy. Has not tried oral nutrition samples yet.  Drinking diet soda.  Does not eat much breakfast.  Says that she has eaten some Frosty's recently too.    Medications: reviewed  Labs: Na 132, K 2.8, glucose 128, calcium 8.6, Mag 1.4  Anthropometrics:   Weight 110 lb today  112 lb on 1/4 117 lb on 10/19    NUTRITION DIAGNOSIS: Inadequate oral intake continues    INTERVENTION:  Discussed importance of including protein source at meals (ie beans with all vegetable meal).  Reviewed foods high in protein and calories.  Another High Calorie, High Protein Diet handout given to patient.  Encouraged patient to try oral nutrition supplements.  Needs additional calories and protein with continued weight loss    MONITORING, EVALUATION, GOAL: weight  trends, intake   NEXT VISIT: Wednesday, Feb 15 during infusion  Zaila Crew B. Zenia Resides, [place], [place] Registered Dietitian [phone] (mobile)

## 2021-02-07 NOTE — Progress Notes (Signed)
Cardiology Office Note    Date:  02/09/2021   ID:  RIELLY BRUNN, DOB Sep 29, 1940, MRN [id number]  PCP:  Derinda Late, MD  Cardiologist:  [name], MD  Electrophysiologist:  None   Chief Complaint: Follow-up  History of Present Illness:   LYNZE REDDY is a 81 y.o. 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  She was admitted to [facility] in 04/2020 with progressive exertional dyspnea.  CTA of the chest demonstrated a hypoenhancing lesion in the left lower lobe.  High-sensitivity troponin greater than 19,000.  LHC showed severe calcified left main with three-vessel CAD, as outlined below.  There was significant pressure dampening with catheter engagement of the left main.  She was transferred to [facility] for consideration of CABG.  Echo demonstrated an EF of 40 to 45%, global hypokinesis, mild concentric LVH, indeterminate LV diastolic function parameters, normal RV systolic function and ventricular cavity size, mildly elevated PASP at 36.6 mmHg, moderately dilated left atrium, moderate mitral regurgitation, and an estimated right atrial pressure of 3 mmHg.  Pre-CABG vascular imaging demonstrated 1 to 39% bilateral ICA stenosis.  She subsequently underwent four-vessel CABG in 04/2020 with LIMA to LAD, sequential SVG to OM3 and PDA, and SVG to OM1.  Prior note indicates at her visit in 06/2020 she requested to come off all of her medications as she did not feel they were helpful.   Note indicates her preference was for less frequent cardiac visits.  She declined changes at that time.  She was readmitted in 07/2020 with COVID-19 and volume overload.  Echo demonstrated a worsening cardiomyopathy with an EF of 20-25%, global hypokinesis, mildly to moderately dilated LV internal cavity size, normal LV diastolic function parameters, normal RV systolic function with mildly enlarged ventricular cavity size, mild biatrial enlargement, moderate pleural effusion in the left lateral region, moderate mitral regurgitation, and trivial aortic insufficiency.  R/LHC demonstrated severe underlying left main and three-vessel CAD with patent grafts including LIMA to LAD, SVG to OM1, and sequential SVG to PDA and OM.  Significant PAD was noted with heavily calcified vessels.  There was evidence of moderate to severe disease affecting the right common femoral artery.  RHC showed moderately elevated filling pressures, mild pulmonary hypertension, and mildly reduced cardiac output.  She was felt to have a component of nonischemic cardiomyopathy in addition to ischemic cardiomyopathy.  Medication optimization and ongoing IV diuresis was recommended.  Subsequent bronchoscopy of her previously noted lung mass revealed squamous cell carcinoma.  MRI of the brain was negative for disease.  She subsequently underwent left lower lobe lobectomy and lymph node dissection on 11/13/2020.  She has been followed by oncology with adjuvant chemotherapy.  She was last seen in our office in 09/2020 and was without symptoms of angina or decompensation.  She declined escalation of medical therapy.  She did undergo previously scheduled repeat echo on 12/21/2020 which demonstrated a persistent cardiomyopathy with an EF of 20 to 25%, global hypokinesis with inferior/posterior/lateral wall motion best preserved, moderately dilated  LV internal cavity size, mild LVH, indeterminate LV diastolic function parameters, normal RV systolic  function and ventricular cavity size, normal PASP, mild mitral regurgitation, and an estimated right atrial pressure of 3 mmHg  She comes in doing well from a cardiac perspective without symptoms of angina or decompensation.  No lower extremity swelling.  She has stopped in a recliner for many years, this is due to comfort as she previously slept in one to be by her husband before his passing.  She does not add salt to food and is drinking less than 2 L of fluid per day.  With regards to her hypokalemia and hypomagnesemia, she has been placed on repletion per oncology.  No falls, hematochezia, or melena.  She does note easy bruising when she bumps into things, otherwise does not have any issues or concerns at this time.  Her weight has been stable.   Labs independently reviewed: 01/2021 - potassium 2.8, BUN 9, serum creatinine 0.91, albumin 3.5, AST/ALT normal, Hgb 10.2, PLT 192, magnesium 1.4 06/2020 - A1c 5.4 04/2020 - TC 99, TG 190, HDL 29, LDL 32, TSH normal  Past Medical History:  Diagnosis Date   Arthritis    Breast cancer ([place]) 2011   radiation- Left   CHF (congestive heart failure) (HCC)    Coronary artery disease    Dyspnea    Hyperlipidemia    Hypertension    Myocardial infarction [facility])    Personal history of radiation therapy    PVD (peripheral vascular disease) ([place])     Past Surgical History:  Procedure Laterality Date   ABDOMINAL HYSTERECTOMY     APPENDECTOMY     BREAST BIOPSY Left 12/21/2009   positive, radiation dcis   BREAST EXCISIONAL BIOPSY Left 01/17/2010   lumpectomy DCIS   BREAST LUMPECTOMY     CATARACT EXTRACTION W/ INTRAOCULAR LENS IMPLANT & ANTERIOR VITRECTOMY, BILATERAL     CHOLECYSTECTOMY     COLONOSCOPY WITH PROPOFOL N/A 09/25/2017   Procedure: COLONOSCOPY WITH PROPOFOL;  Surgeon: Lollie Sails, MD;  Location[facility] ENDOSCOPY;  Service: Endoscopy;  Laterality: N/A;   CORONARY ARTERY BYPASS GRAFT N/A 05/03/2020   Procedure: CORONARY ARTERY BYPASS  GRAFTING (CABG) X FOUR ON PUMP USING LEFT INTERNAL MAMMARY ARTERY AND RIGHT ENDOSCOPIC SAPHEOUS VEIN HARVEST CONDUITS;  Surgeon: Melrose Nakayama, MD;  Location: [place];  Service: Open Heart Surgery;  Laterality: N/A;   DILATION AND CURETTAGE OF UTERUS     EYE SURGERY     bilateral cataracts   INTERCOSTAL NERVE BLOCK Left 11/13/2020   Procedure: INTERCOSTAL NERVE BLOCK;  Surgeon: Melrose Nakayama, MD;  Location: [place];  Service: Thoracic;  Laterality: Left;   IR IMAGING GUIDED PORT INSERTION  01/12/2021   JOINT REPLACEMENT Right 01/24/2016   KNEE ARTHROPLASTY Right 01/24/2016   Procedure: COMPUTER ASSISTED TOTAL KNEE ARTHROPLASTY;  Surgeon: Dereck Leep, MD;  Location: [facility] ORS;  Service: Orthopedics;  Laterality: Right;   LEFT HEART CATH AND CORONARY ANGIOGRAPHY N/A 05/01/2020   Procedure: LEFT HEART CATH AND CORONARY ANGIOGRAPHY;  Surgeon: Wellington Hampshire, MD;  Location: [place] CV LAB;  Service: Cardiovascular;  Laterality: N/A;   LUNG LOBECTOMY  10/2020   LYMPH NODE DISSECTION Left 11/13/2020   Procedure: LYMPH NODE DISSECTION;  Surgeon: Melrose Nakayama, MD;  Location[facility] OR;  Service: Thoracic;  Laterality: Left;   RIGHT/LEFT HEART CATH AND CORONARY/GRAFT ANGIOGRAPHY N/A 07/27/2020   Procedure: RIGHT/LEFT HEART CATH AND CORONARY/GRAFT ANGIOGRAPHY;  Surgeon: Wellington Hampshire, MD;  Location: [place] CV LAB;  Service: Cardiovascular;  Laterality: N/A;   stent in Lt leg Left    TEE WITHOUT CARDIOVERSION N/A 05/03/2020   Procedure: TRANSESOPHAGEAL ECHOCARDIOGRAM (TEE);  Surgeon: Melrose Nakayama, MD;  Location: [place];  Service: Open Heart Surgery;  Laterality: N/A;   VIDEO BRONCHOSCOPY WITH ENDOBRONCHIAL NAVIGATION N/A 09/25/2020   Procedure: VIDEO BRONCHOSCOPY WITH ENDOBRONCHIAL NAVIGATION;  Surgeon: Melrose Nakayama, MD;  Location: [facility] OR;  Service: Thoracic;  Laterality: N/A;    Current Medications: Current Meds  Medication Sig   acetaminophen  (TYLENOL) 500 MG tablet Take 1,000 mg by mouth every 6 (six) hours as needed for moderate pain or headache.   aspirin EC 81 MG tablet Take 81 mg by mouth daily.   atorvastatin (LIPITOR) 20 MG tablet Take 1 tablet (20 mg total) by mouth at bedtime.   Calcium Carbonate-Vit D-Min (CALCIUM 600+D3 PLUS MINERALS) 600-800 MG-UNIT TABS Take 1 tablet by mouth daily.   chlorpheniramine-HYDROcodone (TUSSIONEX) 10-8 MG/5ML SUER Take 5 mLs by mouth every 12 (twelve) hours as needed for cough.   clopidogrel (PLAVIX) 75 MG tablet Take 1 tablet (75 mg total) by mouth daily.   diphenhydrAMINE-zinc acetate (BENADRYL) cream Apply 1 application topically daily as needed for itching.   diphenoxylate-atropine (LOMOTIL) 2.5-0.025 MG tablet Take 2 tablets by mouth 4 (four) times daily as needed for diarrhea or loose stools.   furosemide (LASIX) 40 MG tablet Take 1 tablet (40 mg total) by mouth daily.   gabapentin (NEURONTIN) 100 MG capsule Take 1 capsule (100 mg total) by mouth 3 (three) times daily.   lidocaine-prilocaine (EMLA) cream Apply 1 application topically as needed. Apply to port and cover with saran wrap 1-2 hours prior to port access   Magnesium 500 MG TABS Take 1 tablet by mouth daily.   metoprolol succinate (TOPROL-XL) 50 MG 24 hr tablet Take 1 tablet (50 mg total) by mouth daily. Take with or immediately following a meal.   Multiple Vitamin (MULTIVITAMIN WITH MINERALS) TABS tablet Take 1 tablet by mouth daily.   nitroGLYCERIN (NITROSTAT) 0.4 MG SL tablet Place 1 tablet (0.4 mg total) under the tongue every 5 (five) minutes as needed for up to 10 days for chest pain.   ondansetron (ZOFRAN) 8 MG tablet Take 1 tablet (8 mg total) by mouth 2 (two) times daily as needed (Nausea or vomiting).   oxyCODONE (OXY IR/ROXICODONE) 5 MG immediate release tablet Take 1 tablet (5 mg total) by mouth 3 (three) times daily as needed for severe pain.   potassium chloride (KLOR-CON M) 10 MEQ tablet Take 2 tablets (20 mEq total)  by mouth 2 (two) times daily.   sacubitril-valsartan (ENTRESTO) 24-26 MG Take 1 tablet by mouth 2 (two) times daily.   sacubitril-valsartan (ENTRESTO) 49-51 MG Take 1 tablet by mouth 2 (two) times daily.    Allergies:   Ibuprofen, Penicillins, Aleve [naproxen sodium], Avapro  [irbesartan], Azithromycin, and Nickel   Social History   Socioeconomic History   Marital status: Widowed    Spouse name: Not on file   Number of children: Not on file   Years of education: Not on file   Highest education level: Not on file  Occupational History   Not on file  Tobacco Use   Smoking status: Former   Smokeless tobacco: Never  Vaping Use   Vaping Use: Never used  Substance and Sexual Activity   Alcohol use: No    Alcohol/week: 0.0 standard drinks   Drug use: No  Sexual activity: Not Currently  Other Topics Concern   Not on file  Social History Narrative   Not on file   Social Determinants of Health   Financial Resource Strain: Not on file  Food Insecurity: Not on file  Transportation Needs: Not on file  Physical Activity: Not on file  Stress: Not on file  Social Connections: Not on file     Family History:  The patient's family history includes Arthritis in her mother; Gout in her mother; Heart attack in her sister; Hypertension in her mother; Lung cancer in her father.  ROS:   Review of Systems  Constitutional:  Positive for malaise/fatigue. Negative for chills, diaphoresis, fever and weight loss.  HENT:  Negative for congestion.   Eyes:  Negative for discharge and redness.  Respiratory:  Negative for cough, sputum production, shortness of breath and wheezing.   Cardiovascular:  Negative for chest pain, palpitations, orthopnea, claudication, leg swelling and PND.  Gastrointestinal:  Negative for abdominal pain, blood in stool, heartburn, melena, nausea and vomiting.  Musculoskeletal:  Negative for falls and myalgias.  Skin:  Negative for rash.  Neurological:  Negative for  dizziness, tingling, tremors, sensory change, speech change, focal weakness, loss of consciousness and weakness.  Endo/Heme/Allergies:  Bruises/bleeds easily.  Psychiatric/Behavioral:  Negative for substance abuse. The patient is not nervous/anxious.   All other systems reviewed and are negative.   EKGs/Labs/Other Studies Reviewed:    Studies reviewed were summarized above. The additional studies were reviewed today:  LHC 05/01/2020: There is moderate left ventricular systolic dysfunction. LV end diastolic pressure is mildly elevated. The left ventricular ejection fraction is 35-45% by visual estimate. Ost RCA to Prox RCA lesion is 60% stenosed. Mid RCA to Dist RCA lesion is 100% stenosed. Ost LM to Dist LM lesion is 80% stenosed. Ost Cx to Prox Cx lesion is 80% stenosed. Ost LAD to Prox LAD lesion is 80% stenosed.   1.  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  Significant pressure dampening with catheter engagement of the left main coronary artery. 2.  Moderately reduced LV systolic function.  Mildly elevated left ventricular end-diastolic pressure.   Recommendations: Recommend transfer to [facility] for CABG evaluation. Obtain an echocardiogram to better evaluate LV systolic function and valvular abnormalities. Resume heparin 6 hours after sheath pull. __________  2D echo 05/02/2020: 1. Left ventricular ejection fraction, by estimation, is 40 to 45%. The  left ventricle has mildly decreased function. The left ventricle  demonstrates global hypokinesis. There is mild concentric left ventricular  hypertrophy. Indeterminate diastolic  filling due to E-A fusion. Elevated left ventricular end-diastolic  pressure.   2. Right ventricular systolic function is normal. The right ventricular  size is normal. There is mildly elevated pulmonary artery systolic  pressure. The estimated right ventricular systolic pressure is 01.0 mmHg.   3. Left atrial size was  moderately dilated.   4. The mitral valve is normal in structure. Moderate mitral valve  regurgitation.   5. The aortic valve is grossly normal. Aortic valve regurgitation is not  visualized.   6. The inferior vena cava is normal in size with greater than 50%  respiratory variability, suggesting right atrial pressure of 3 mmHg. __________  Intraoperative TEE 2/72/5366: Complications: No known complications during this procedure.  POST-OP IMPRESSIONS  - Left Ventricle: The left ventricle is unchanged from pre-bypass. The  cavity  size was normal.  - Right Ventricle: The right ventricle appears unchanged from pre-bypass.  - Aorta: The aorta  appears unchanged from pre-bypass.  - Left Atrium: The left atrium appears unchanged from pre-bypass.  - Left Atrial Appendage: The left atrial appendage appears unchanged from  pre-bypass.  - Aortic Valve: The aortic valve appears unchanged from pre-bypass.  - Mitral Valve: There is mild regurgitation.  - Tricuspid Valve: The tricuspid valve appears unchanged from pre-bypass.  - Pulmonic Valve: The pulmonic valve appears unchanged from pre-bypass.  - Interatrial Septum: The interatrial septum appears unchanged from  pre-bypass.  - Interventricular Septum: The interventricular septum appears unchanged  from  pre-bypass.  - Pericardium: The pericardium appears unchanged from pre-bypass.  - Comments: Post-bypass images reviewed with surgeon.  __________  2D echo 07/25/2020: 1. Left ventricular ejection fraction, by estimation, is 20 to 25%. The  left ventricle has severely decreased function. The left ventricle  demonstrates global hypokinesis. The left ventricular internal cavity size  was mildly to moderately dilated. Left  ventricular diastolic parameters were normal.   2. Right ventricular systolic function is normal. The right ventricular  size is mildly enlarged.   3. Left atrial size was mildly dilated.   4. Right atrial size was mildly  dilated.   5. Moderate pleural effusion in the left lateral region.   6. The mitral valve is grossly normal. Moderate mitral valve  regurgitation.   7. The aortic valve is grossly normal. Aortic valve regurgitation is  trivial. __________  [facility] 07/27/2020:   Ost LAD to Prox LAD lesion is 80% stenosed.   Ost Cx to Prox Cx lesion is 80% stenosed.   Ost RCA to Prox RCA lesion is 60% stenosed.   Mid RCA lesion is 100% stenosed.   Ost LM to Dist LM lesion is 90% stenosed.   SVG graft was visualized by angiography and is normal in caliber.   SVG graft was visualized by angiography and is normal in caliber.   LIMA graft was visualized by non-selective angiography and is normal in caliber.   The graft exhibits no disease.   The graft exhibits no disease.   The graft exhibits no disease.   1.  Severe underlying left main and three-vessel coronary artery disease with patent grafts including LIMA to LAD, SVG to OM1 and SVG to right PDA/OM. 2.  Left ventricular angiography was not performed.  EF was severely reduced by echo. 3.  Significant peripheral arterial disease with heavy calcified vessels.  There is evidence of moderate to severe disease affecting the right common femoral artery. 4.  Right heart catheterization showed moderately elevated filling pressures, mild pulmonary hypertension and mildly reduced cardiac output.   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  Her grafts are patent.  Continue medical therapy for coronary artery disease. She is still volume overloaded and thus recommending continuing IV diuresis.  Optimize heart failure medications. __________  2D echo 12/21/2020: 1. Left ventricular ejection fraction, by estimation, is 20 to 25%. The  left ventricle has severely decreased function. The left ventricle  demonstrates global hypokinesis , inferior/posterior/lateral wall motion  best preserved. The left ventricular  internal cavity  size was moderately dilated. There is mild left  ventricular hypertrophy. Left ventricular diastolic parameters are  indeterminate.   2. Right ventricular systolic function is normal. The right ventricular  size is normal. There is normal pulmonary artery systolic pressure. The  estimated right ventricular systolic pressure is 52.8 mmHg.   3. The mitral valve is normal in structure. Mild mitral valve  regurgitation. No evidence of mitral stenosis.  4. The aortic valve is normal in structure. Aortic valve regurgitation is  not visualized. No aortic stenosis is present.   5. The inferior vena cava is normal in size with greater than 50%  respiratory variability, suggesting right atrial pressure of 3 mmHg.   Comparison(s): LVEF 20-25%.    EKG:  EKG is not ordered today.    Recent Labs: 05/01/2020: TSH 2.469 07/24/2020: B Natriuretic Peptide 745.6 02/07/2021: ALT 13; BUN 9; Creatinine, Ser 0.91; Hemoglobin 10.2; Magnesium 1.4; Platelets 192; Potassium 2.8; Sodium 132  Recent Lipid Panel    Component Value Date/Time   CHOL 99 05/01/2020 0555   TRIG 190 (H) 05/01/2020 0555   HDL 29 (L) 05/01/2020 0555   CHOLHDL 3.4 05/01/2020 0555   VLDL 38 05/01/2020 0555   LDLCALC 32 05/01/2020 0555    PHYSICAL EXAM:    VS:  BP (!) 142/70 (BP Location: Right Arm, Patient Position: Sitting, Cuff Size: Normal)    Pulse 85    Ht 5' (1.524 m)    Wt 111 lb (50.3 kg)    SpO2 97%    BMI 21.68 kg/m   BMI: Body mass index is 21.68 kg/m.  Physical Exam Vitals reviewed.  Constitutional:      Appearance: She is well-developed.  HENT:     Head: Normocephalic and atraumatic.  Eyes:     General:        Right eye: No discharge.        Left eye: No discharge.  Neck:     Vascular: No JVD.  Cardiovascular:     Rate and Rhythm: Normal rate and regular rhythm.     Pulses:          Posterior tibial pulses are 2+ on the right side and 2+ on the left side.     Heart sounds: Normal heart sounds, S1 normal  and S2 normal. Heart sounds not distant. No midsystolic click and no opening snap. No murmur heard.   No friction rub.  Pulmonary:     Effort: Pulmonary effort is normal. No respiratory distress.     Breath sounds: Normal breath sounds. No decreased breath sounds, wheezing or rales.  Chest:     Chest wall: No tenderness.  Abdominal:     General: There is no distension.     Palpations: Abdomen is soft.     Tenderness: There is no abdominal tenderness.  Musculoskeletal:     Cervical back: Normal range of motion.     Right lower leg: No edema.     Left lower leg: No edema.  Skin:    General: Skin is warm and dry.     Nails: There is no clubbing.  Neurological:     Mental Status: She is alert and oriented to person, place, and time.  Psychiatric:        Speech: Speech normal.        Behavior: Behavior normal.        Thought Content: Thought content normal.        Judgment: Judgment normal.    Wt Readings from Last 3 Encounters:  02/09/21 111 lb (50.3 kg)  02/07/21 110 lb (49.9 kg)  01/31/21 111 lb (50.3 kg)     ASSESSMENT & PLAN:   CAD status post CABG without angina: She is doing well without symptoms concerning for angina.  Continue aggressive risk factor modification and secondary prevention including aspirin, clopidogrel, atorvastatin, and metoprolol succinate.  No indication for further ischemic testing at this  time.  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  Follow-up echo in 12/2020 demonstrated a persistent cardiomyopathy with normal right-sided pressures.  Escalate GDMT with titration of Entresto to 49/51 mg twice daily.  She will otherwise remain on Toprol-XL and furosemide with KCl repletion.  When she is seen in follow-up next month, recommend further escalation of GDMT as labs and vital signs allow with possible titration of Entresto along with addition of spironolactone followed by addition of SGLT2 inhibitor.  If  spironolactone is added at her next visit, we can likely discontinue KCl.  Following optimization of maximally tolerated GDMT, we will need to repeat a limited echo in several months time to reevaluate her LV systolic function.  If her cardiomyopathy remains less than 35% at that time, she will need to be referred to EP for consideration of ICD.  CHF education.  HTN: Blood pressure is mildly elevated in the office today.  Escalate Entresto as outlined above.  Otherwise, she remains on Toprol-XL and furosemide.  HLD: LDL 32 with goal being less than 50.  She remains on atorvastatin.  PAD: No symptoms of lifestyle limiting claudication or nonhealing wounds at this time.  She remains on aspirin, clopidogrel, and atorvastatin.  Followed by vascular surgery.  Squamous cell carcinoma of the lung with history of breast cancer: Followed by oncology.  Hypokalemia/hypomagnesemia: Being repleted per oncology.  Follow-up as directed.  Anemia: Stable.  Follow-up with oncology as directed.    Disposition: F/u with Dr. Fletcher Anon or an APP in 6 weeks.   Medication Adjustments/Labs and Tests Ordered: Current medicines are reviewed at length with the patient today.  Concerns regarding medicines are outlined above. Medication changes, Labs and Tests ordered today are summarized above and listed in the Patient Instructions accessible in Encounters.   Signed, Christell Faith, PA-C 02/09/2021 12:40 PM     [address] [place], [place] [address] [phone]

## 2021-02-07 NOTE — Progress Notes (Signed)
Pt c/o weakness in legs since chemo treatments started. Pt reports constant runny nose with occasional nose bleeds.

## 2021-02-07 NOTE — Progress Notes (Signed)
Per Gust Rung RN per Dr. Grayland Ormond 02/07/21 labs have been reviewed and okay to proceed with scheduled treatment, Pt to receive an additional 20 MEQs of potassium (total of 40MEQS) and an additional 2g Magnesium ( total of 4 grams)     Per Matt C. RPH okay to run 20 MEqs in 100 ml over two hours, 20Meqs of Potassium in 1035ml NS at a rate of 250cc/hr and 4g of Magnesium over 2 hours at the same time through the port.

## 2021-02-07 NOTE — Patient Instructions (Signed)
[facility] AT [place]  Discharge Instructions: Thank you for choosing [facility] to provide your oncology and hematology care.  If you have a lab appointment with the [place] [facility], please go directly to the [facility] and check in at the registration area.  Wear comfortable clothing and clothing appropriate for easy access to any Portacath or PICC line.   We strive to give you quality time with your provider. You may need to reschedule your appointment if you arrive late (15 or more minutes).  Arriving late affects you and other patients whose appointments are after yours.  Also, if you miss three or more appointments without notifying the office, you may be dismissed from the clinic at the providers discretion.      For prescription refill requests, have your pharmacy contact our office and allow 72 hours for refills to be completed.    Today you received the following chemotherapy and/or immunotherapy agents Taxotere and Cisplatin       To help prevent nausea and vomiting after your treatment, we encourage you to take your nausea medication as directed.  BELOW ARE SYMPTOMS THAT SHOULD BE REPORTED IMMEDIATELY: *FEVER GREATER THAN 100.4 F (38 C) OR HIGHER *CHILLS OR SWEATING *NAUSEA AND VOMITING THAT IS NOT CONTROLLED WITH YOUR NAUSEA MEDICATION *UNUSUAL SHORTNESS OF BREATH *UNUSUAL BRUISING OR BLEEDING *URINARY PROBLEMS (pain or burning when urinating, or frequent urination) *BOWEL PROBLEMS (unusual diarrhea, constipation, pain near the anus) TENDERNESS IN MOUTH AND THROAT WITH OR WITHOUT PRESENCE OF ULCERS (sore throat, sores in mouth, or a toothache) UNUSUAL RASH, SWELLING OR PAIN  UNUSUAL VAGINAL DISCHARGE OR ITCHING   Items with * indicate a potential emergency and should be followed up as soon as possible or go to the Emergency Department if any problems should occur.  Please show the CHEMOTHERAPY ALERT CARD or IMMUNOTHERAPY ALERT CARD at  check-in to the Emergency Department and triage nurse.  Should you have questions after your visit or need to cancel or reschedule your appointment, please contact [facility] [place] AT [place]  [phone] and follow the prompts.  Office hours are 8:00 a.m. to 4:30 p.m. Monday - Friday. Please note that voicemails left after 4:00 p.m. may not be returned until the following business day.  We are closed weekends and major holidays. You have access to a nurse at all times for urgent questions. Please call the main number to the clinic [phone] and follow the prompts.  For any non-urgent questions, you may also contact your provider using MyChart. We now offer e-Visits for anyone 61 and older to request care online for non-urgent symptoms. For details visit [email or web address].   Also download the MyChart app! Go to the app store, search "MyChart", open the app, select [facility], and log in with your MyChart username and password.  Due to Covid, a mask is required upon entering the hospital/clinic. If you do not have a mask, one will be given to you upon arrival. For doctor visits, patients may have 1 support person aged 66 or older with them. For treatment visits, patients cannot have anyone with them due to current Covid guidelines and our immunocompromised population.

## 2021-02-08 ENCOUNTER — Encounter: Payer: Self-pay | Admitting: Oncology

## 2021-02-09 ENCOUNTER — Other Ambulatory Visit: Payer: Self-pay

## 2021-02-09 ENCOUNTER — Encounter: Payer: Self-pay | Admitting: Physician Assistant

## 2021-02-09 ENCOUNTER — Ambulatory Visit (INDEPENDENT_AMBULATORY_CARE_PROVIDER_SITE_OTHER): Payer: Medicare Other | Admitting: Physician Assistant

## 2021-02-09 ENCOUNTER — Inpatient Hospital Stay: Payer: Medicare Other

## 2021-02-09 VITALS — BP 142/70 | HR 85 | Ht 60.0 in | Wt 111.0 lb

## 2021-02-09 VITALS — BP 140/60 | HR 108 | Temp 99.5°F | Resp 16

## 2021-02-09 DIAGNOSIS — C3492 Malignant neoplasm of unspecified part of left bronchus or lung: Secondary | ICD-10-CM

## 2021-02-09 DIAGNOSIS — I2510 Atherosclerotic heart disease of native coronary artery without angina pectoris: Secondary | ICD-10-CM | POA: Diagnosis not present

## 2021-02-09 DIAGNOSIS — Z5111 Encounter for antineoplastic chemotherapy: Secondary | ICD-10-CM | POA: Diagnosis not present

## 2021-02-09 DIAGNOSIS — C3432 Malignant neoplasm of lower lobe, left bronchus or lung: Secondary | ICD-10-CM

## 2021-02-09 DIAGNOSIS — I739 Peripheral vascular disease, unspecified: Secondary | ICD-10-CM

## 2021-02-09 DIAGNOSIS — E876 Hypokalemia: Secondary | ICD-10-CM

## 2021-02-09 DIAGNOSIS — I5020 Unspecified systolic (congestive) heart failure: Secondary | ICD-10-CM | POA: Diagnosis not present

## 2021-02-09 DIAGNOSIS — Z853 Personal history of malignant neoplasm of breast: Secondary | ICD-10-CM

## 2021-02-09 DIAGNOSIS — Z951 Presence of aortocoronary bypass graft: Secondary | ICD-10-CM

## 2021-02-09 DIAGNOSIS — I10 Essential (primary) hypertension: Secondary | ICD-10-CM | POA: Diagnosis not present

## 2021-02-09 DIAGNOSIS — D649 Anemia, unspecified: Secondary | ICD-10-CM

## 2021-02-09 DIAGNOSIS — E785 Hyperlipidemia, unspecified: Secondary | ICD-10-CM

## 2021-02-09 MED ORDER — PEGFILGRASTIM-CBQV 6 MG/0.6ML ~~LOC~~ SOSY
6.0000 mg | PREFILLED_SYRINGE | Freq: Once | SUBCUTANEOUS | Status: AC
  Administered 2021-02-09: 6 mg via SUBCUTANEOUS
  Filled 2021-02-09: qty 0.6

## 2021-02-09 MED ORDER — SODIUM CHLORIDE 0.9 % IV SOLN
Freq: Once | INTRAVENOUS | Status: AC
  Filled 2021-02-09: qty 250

## 2021-02-09 MED ORDER — HEPARIN SOD (PORK) LOCK FLUSH 100 UNIT/ML IV SOLN
INTRAVENOUS | Status: AC
  Administered 2021-02-09: 500 [IU]
  Filled 2021-02-09: qty 5

## 2021-02-09 MED ORDER — SACUBITRIL-VALSARTAN 49-51 MG PO TABS: 1.0000 | ORAL_TABLET | Freq: Two times a day (BID) | ORAL | 3 refills | Status: DC

## 2021-02-09 NOTE — Progress Notes (Signed)
Patient assistance application completed today. Patient instructed to bring in the first 2 pages of 1040 tax return to submit with application. She will bring this back to the office and drop it off. Placed application in my "Pending" folder.

## 2021-02-09 NOTE — Patient Instructions (Signed)
Medication Instructions:  Your physician has recommended you make the following change in your medication:   INCREASE Entresto to 49-51 mg twice a day (After we hear back from assistance program)  *If you need a refill on your cardiac medications before your next appointment, please call your pharmacy*   Lab Work: None  If you have labs (blood work) drawn today and your tests are completely normal, you will receive your results only by: [place] (if you have MyChart) OR A paper copy in the mail If you have any lab test that is abnormal or we need to change your treatment, we will call you to review the results.   Testing/Procedures: None   Follow-Up: At [facility], you and your health needs are our priority.  As part of our continuing mission to provide you with exceptional heart care, we have created designated Provider Care Teams.  These Care Teams include your primary Cardiologist (physician) and Advanced Practice Providers (APPs -  Physician Assistants and Nurse Practitioners) who all work together to provide you with the care you need, when you need it.  Your next appointment:   6 week(s)  The format for your next appointment:   In Person  Provider:   Christell Faith, PA-C

## 2021-02-20 NOTE — Progress Notes (Signed)
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  INTERVAL HISTORY: Patient returns to clinic as an add-on with complaints of increasing weakness and fatigue, nausea, diarrhea, and poor appetite. She has no neurologic complaints.  She denies any recent fevers.  She has no chest pain, shortness of breath, cough, or hemoptysis.  She denies any nausea, vomiting, constipation, or diarrhea.  She has no urinary complaints.  Patient offers no further specific complaints today.  REVIEW OF SYSTEMS:   Review of Systems  Constitutional:  Positive for malaise/fatigue. Negative for fever and weight loss.  Respiratory:  Negative for cough, hemoptysis and shortness of breath.   Cardiovascular: Negative.  Negative for chest pain and leg swelling.  Gastrointestinal:  Positive for diarrhea and nausea. Negative for abdominal pain.  Genitourinary: Negative.  Negative for dysuria.  Musculoskeletal: Negative.  Negative for back pain.  Skin: Negative.  Negative for rash.  Neurological:  Positive for weakness. Negative for dizziness, focal weakness and headaches.  Psychiatric/Behavioral: Negative.  The patient is not nervous/anxious.    As per HPI. Otherwise, a complete review of systems is negative.  PAST MEDICAL HISTORY: Past Medical History:  Diagnosis Date   Arthritis    Breast cancer ([place]) 2011   radiation- Left   CHF (congestive heart failure) (HCC)    Coronary artery disease    Dyspnea    Hyperlipidemia    Hypertension    Myocardial infarction [facility])    Personal history  of radiation therapy    PVD (peripheral vascular disease) ([place])     PAST SURGICAL HISTORY: Past Surgical History:  Procedure Laterality Date   ABDOMINAL HYSTERECTOMY     APPENDECTOMY     BREAST BIOPSY Left 12/21/2009   positive, radiation dcis   BREAST EXCISIONAL BIOPSY Left 01/17/2010   lumpectomy DCIS   BREAST LUMPECTOMY     CATARACT EXTRACTION W/ INTRAOCULAR LENS IMPLANT & ANTERIOR VITRECTOMY, BILATERAL     CHOLECYSTECTOMY     COLONOSCOPY WITH PROPOFOL N/A 09/25/2017   Procedure: COLONOSCOPY WITH PROPOFOL;  Surgeon: Lollie Sails, MD;  Location: [facility] ENDOSCOPY;  Service: Endoscopy;  Laterality: N/A;   CORONARY ARTERY BYPASS GRAFT N/A 05/03/2020   Procedure: CORONARY ARTERY BYPASS GRAFTING (CABG) X FOUR ON PUMP USING LEFT INTERNAL MAMMARY ARTERY AND RIGHT ENDOSCOPIC SAPHEOUS VEIN HARVEST CONDUITS;  Surgeon: Melrose Nakayama, MD;  Location: [place];  Service: Open Heart Surgery;  Laterality: N/A;   DILATION AND CURETTAGE OF UTERUS     EYE SURGERY     bilateral cataracts   INTERCOSTAL NERVE BLOCK Left 11/13/2020   Procedure: INTERCOSTAL NERVE BLOCK;  Surgeon: Melrose Nakayama, MD;  Location: [place];  Service: Thoracic;  Laterality: Left;   IR IMAGING GUIDED PORT INSERTION  01/12/2021   JOINT REPLACEMENT Right 01/24/2016   KNEE ARTHROPLASTY Right 01/24/2016   Procedure: COMPUTER ASSISTED TOTAL KNEE ARTHROPLASTY;  Surgeon: Dereck Leep, MD;  Location: [facility] ORS;  Service: Orthopedics;  Laterality: Right;   LEFT HEART CATH AND CORONARY ANGIOGRAPHY N/A 05/01/2020   Procedure: LEFT HEART CATH AND CORONARY ANGIOGRAPHY;  Surgeon: Wellington Hampshire, MD;  Location:  [place] CV LAB;  Service: Cardiovascular;  Laterality: N/A;   LUNG LOBECTOMY  10/2020   LYMPH NODE DISSECTION Left 11/13/2020   Procedure: LYMPH NODE DISSECTION;  Surgeon: Melrose Nakayama, MD;  Location: [facility] OR;  Service: Thoracic;  Laterality: Left;   RIGHT/LEFT HEART CATH AND CORONARY/GRAFT ANGIOGRAPHY  N/A 07/27/2020   Procedure: RIGHT/LEFT HEART CATH AND CORONARY/GRAFT ANGIOGRAPHY;  Surgeon: Wellington Hampshire, MD;  Location: [place] [facility] LAB;  Service: Cardiovascular;  Laterality: N/A;   stent in Lt leg Left    TEE WITHOUT CARDIOVERSION N/A 05/03/2020   Procedure: TRANSESOPHAGEAL ECHOCARDIOGRAM (TEE);  Surgeon: Melrose Nakayama, MD;  Location: [facility];  Service: Open Heart Surgery;  Laterality: N/A;   VIDEO BRONCHOSCOPY WITH ENDOBRONCHIAL NAVIGATION N/A 09/25/2020   Procedure: VIDEO BRONCHOSCOPY WITH ENDOBRONCHIAL NAVIGATION;  Surgeon: Melrose Nakayama, MD;  Location: [facility] OR;  Service: Thoracic;  Laterality: N/A;    FAMILY HISTORY: Family History  Problem Relation Age of Onset   Hypertension Mother    Arthritis Mother    Gout Mother    Lung cancer Father    Heart attack Sister     ADVANCED DIRECTIVES (Y/N):  N  HEALTH MAINTENANCE: Social History   Tobacco Use   Smoking status: Former   Smokeless tobacco: Never  Scientific laboratory technician Use: Never used  Substance Use Topics   Alcohol use: No    Alcohol/week: 0.0 standard drinks   Drug use: No     Colonoscopy:  PAP:  Bone density:  Lipid panel:  Allergies  Allergen Reactions   Ibuprofen Hives   Penicillins Hives    Tolerated Ancef in the past   Aleve [Naproxen Sodium] Hives   Avapro  [Irbesartan] Rash   Azithromycin Hives   Nickel Itching    Redness, itching and fluid discharge with nickel earrings    Current Outpatient Medications  Medication Sig Dispense Refill   dexamethasone (DECADRON) 4 MG tablet Take 1 tablet (4 mg total) by mouth daily. 30 tablet 2   acetaminophen (TYLENOL) 500 MG tablet Take 1,000 mg by mouth every 6 (six) hours as needed for moderate pain or headache.     aspirin EC 81 MG tablet Take 81 mg by mouth daily.     atorvastatin (LIPITOR) 20 MG tablet Take 1 tablet (20 mg total) by mouth at bedtime. 90 tablet 3   Calcium Carbonate-Vit D-Min (CALCIUM 600+D3 PLUS MINERALS) 600-800  MG-UNIT TABS Take 1 tablet by mouth daily.     chlorpheniramine-HYDROcodone (TUSSIONEX) 10-8 MG/5ML SUER Take 5 mLs by mouth every 12 (twelve) hours as needed for cough.     clopidogrel (PLAVIX) 75 MG tablet Take 1 tablet (75 mg total) by mouth daily. 90 tablet 3   diphenhydrAMINE-zinc acetate (BENADRYL) cream Apply 1 application topically daily as needed for itching.     diphenoxylate-atropine (LOMOTIL) 2.5-0.025 MG tablet Take 2 tablets by mouth 4 (four) times daily as needed for diarrhea or loose stools. 24 tablet 0   furosemide (LASIX) 40 MG tablet Take 1 tablet (40 mg total) by mouth daily. 30 tablet 0   gabapentin (NEURONTIN) 100 MG capsule Take 1 capsule (100 mg total) by mouth 3 (three) times daily. 90 capsule 3   lidocaine-prilocaine (EMLA) cream Apply 1 application topically as needed. Apply to port and cover with saran wrap 1-2 hours prior to port access 30 g 1   Magnesium 500 MG TABS Take 1 tablet by mouth daily.     metoprolol succinate (TOPROL-XL)  50 MG 24 hr tablet Take 1 tablet (50 mg total) by mouth daily. Take with or immediately following a meal. 90 tablet 0   Multiple Vitamin (MULTIVITAMIN WITH MINERALS) TABS tablet Take 1 tablet by mouth daily.     nitroGLYCERIN (NITROSTAT) 0.4 MG SL tablet Place 1 tablet (0.4 mg total) under the tongue every 5 (five) minutes as needed for up to 10 days for chest pain. 10 tablet 0   ondansetron (ZOFRAN) 8 MG tablet Take 1 tablet (8 mg total) by mouth 2 (two) times daily as needed (Nausea or vomiting). 60 tablet 1   oxyCODONE (OXY IR/ROXICODONE) 5 MG immediate release tablet Take 1 tablet (5 mg total) by mouth 3 (three) times daily as needed for severe pain. 15 tablet 0   potassium chloride (KLOR-CON M) 10 MEQ tablet Take 2 tablets (20 mEq total) by mouth 2 (two) times daily. 120 tablet 2   sacubitril-valsartan (ENTRESTO) 24-26 MG Take 1 tablet by mouth 2 (two) times daily. 60 tablet 0   sacubitril-valsartan (ENTRESTO) 49-51 MG Take 1 tablet by  mouth 2 (two) times daily. 180 tablet 3   No current facility-administered medications for this visit.   Facility-Administered Medications Ordered in Other Visits  Medication Dose Route Frequency Provider Last Rate Last Admin   heparin lock flush 100 UNIT/ML injection             OBJECTIVE: Vitals:   02/21/21 0932  BP: (!) 119/58  Pulse: 99  Resp: 18  Temp: 98 F (36.7 C)  SpO2: 98%       Body mass index is 20.51 kg/m.    ECOG FS:0 - Asymptomatic  General: Thin, no acute distress. Eyes: Pink conjunctiva, anicteric sclera. HEENT: Normocephalic, moist mucous membranes. Lungs: No audible wheezing or coughing. Heart: Regular rate and rhythm. Abdomen: Soft, nontender, no obvious distention. Musculoskeletal: No edema, cyanosis, or clubbing. Neuro: Alert, answering all questions appropriately. Cranial nerves grossly intact. Skin: No rashes or petechiae noted. Psych: Normal affect.  LAB RESULTS:  Lab Results  Component Value Date   NA 129 (L) 02/21/2021   K 2.5 (LL) 02/21/2021   CL 91 (L) 02/21/2021   CO2 28 02/21/2021   GLUCOSE 116 (H) 02/21/2021   BUN 15 02/21/2021   CREATININE 1.01 (H) 02/21/2021   CALCIUM 7.5 (L) 02/21/2021   PROT 6.6 02/21/2021   ALBUMIN 3.3 (L) 02/21/2021   AST 20 02/21/2021   ALT 11 02/21/2021   ALKPHOS 84 02/21/2021   BILITOT 0.6 02/21/2021   GFRNONAA 57 (L) 02/21/2021   GFRAA >60 01/26/2016    Lab Results  Component Value Date   WBC 13.0 (H) 02/21/2021   NEUTROABS 10.1 (H) 02/21/2021   HGB 9.4 (L) 02/21/2021   HCT 26.9 (L) 02/21/2021   MCV 91.8 02/21/2021   PLT 131 (L) 02/21/2021     STUDIES: No results found.  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  MRI of the brain on October 19, 2020 was negative for  disease.  Given her stage, patient will benefit from adjuvant chemotherapy using cisplatin and Taxotere every 3 weeks for 4 cycles with Udenyca support.  Patient has had port placement.  Patient received cycle 2 of treatment 2 weeks ago.  Proceed with IV fluids, electrolyte replacement, antiemetics today.  Previously,  Taxotere and cisplatin were dose reduced approximately 10%.  Return to clinic in 1 week for further evaluation and consideration of cycle 3.   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  Continue yearly screening mammograms. Cardiac disease: Continue follow-up with cardiology as scheduled. Anemia: Hemoglobin is trended down to 9.4, monitor. Hypokalemia: Patient's potassium levels are significantly reduced at 2.5.  Proceed with 40 mg of IV potassium today continue oral potassium supplementation.   Hypomagnesia: Patient will receive 4 g IV magnesium today.  Decreased oral supplementation as this may be contributing to her diarrhea.   Renal insufficiency: Mild.  IV fluids as above. Nausea: Patient will receive IV Decadron and IV Zofran today. Neutropenia: Resolved.  Patient will require Udenyca with the remainder of the cycles. Hyponatremia: Significantly worse.  IV fluids as above Diarrhea.  Decrease magnesium supplementation Poor appetite: Patient was given a prescription for 4mg  PO decadron.  Patient expressed understanding and was in agreement with this plan. She also understands that She can call clinic at any time with any questions, concerns, or complaints.    Cancer Staging  Primary squamous cell carcinoma of lower lobe of left lung [facility]) Staging form: Lung, AJCC 8th Edition - Pathologic stage from 12/21/2020: Stage IIIB (ypT3, pN2, cM0) - Signed by Lloyd Huger, MD on 12/21/2020 Stage prefix: Post-therapy   Lloyd Huger, MD   02/21/2021 5:56 PM

## 2021-02-21 ENCOUNTER — Inpatient Hospital Stay: Payer: Medicare Other | Attending: Oncology

## 2021-02-21 ENCOUNTER — Inpatient Hospital Stay (HOSPITAL_BASED_OUTPATIENT_CLINIC_OR_DEPARTMENT_OTHER): Payer: Medicare Other | Admitting: Oncology

## 2021-02-21 ENCOUNTER — Inpatient Hospital Stay: Payer: Medicare Other

## 2021-02-21 ENCOUNTER — Other Ambulatory Visit: Payer: Self-pay

## 2021-02-21 VITALS — BP 119/58 | HR 99 | Temp 98.0°F | Resp 18 | Wt 105.0 lb

## 2021-02-21 DIAGNOSIS — Z87891 Personal history of nicotine dependence: Secondary | ICD-10-CM | POA: Diagnosis not present

## 2021-02-21 DIAGNOSIS — Z9049 Acquired absence of other specified parts of digestive tract: Secondary | ICD-10-CM | POA: Insufficient documentation

## 2021-02-21 DIAGNOSIS — R197 Diarrhea, unspecified: Secondary | ICD-10-CM | POA: Diagnosis not present

## 2021-02-21 DIAGNOSIS — I252 Old myocardial infarction: Secondary | ICD-10-CM | POA: Diagnosis not present

## 2021-02-21 DIAGNOSIS — R062 Wheezing: Secondary | ICD-10-CM | POA: Insufficient documentation

## 2021-02-21 DIAGNOSIS — J90 Pleural effusion, not elsewhere classified: Secondary | ICD-10-CM | POA: Insufficient documentation

## 2021-02-21 DIAGNOSIS — E876 Hypokalemia: Secondary | ICD-10-CM | POA: Diagnosis not present

## 2021-02-21 DIAGNOSIS — Z8249 Family history of ischemic heart disease and other diseases of the circulatory system: Secondary | ICD-10-CM | POA: Insufficient documentation

## 2021-02-21 DIAGNOSIS — C3432 Malignant neoplasm of lower lobe, left bronchus or lung: Secondary | ICD-10-CM | POA: Diagnosis present

## 2021-02-21 DIAGNOSIS — I509 Heart failure, unspecified: Secondary | ICD-10-CM | POA: Diagnosis not present

## 2021-02-21 DIAGNOSIS — R110 Nausea: Secondary | ICD-10-CM | POA: Insufficient documentation

## 2021-02-21 DIAGNOSIS — Z886 Allergy status to analgesic agent status: Secondary | ICD-10-CM | POA: Insufficient documentation

## 2021-02-21 DIAGNOSIS — Z79899 Other long term (current) drug therapy: Secondary | ICD-10-CM | POA: Diagnosis not present

## 2021-02-21 DIAGNOSIS — I739 Peripheral vascular disease, unspecified: Secondary | ICD-10-CM | POA: Insufficient documentation

## 2021-02-21 DIAGNOSIS — N289 Disorder of kidney and ureter, unspecified: Secondary | ICD-10-CM | POA: Diagnosis not present

## 2021-02-21 DIAGNOSIS — R531 Weakness: Secondary | ICD-10-CM | POA: Diagnosis not present

## 2021-02-21 DIAGNOSIS — E785 Hyperlipidemia, unspecified: Secondary | ICD-10-CM | POA: Diagnosis not present

## 2021-02-21 DIAGNOSIS — Z880 Allergy status to penicillin: Secondary | ICD-10-CM | POA: Insufficient documentation

## 2021-02-21 DIAGNOSIS — Z20822 Contact with and (suspected) exposure to covid-19: Secondary | ICD-10-CM | POA: Insufficient documentation

## 2021-02-21 DIAGNOSIS — R5383 Other fatigue: Secondary | ICD-10-CM | POA: Diagnosis not present

## 2021-02-21 DIAGNOSIS — R059 Cough, unspecified: Secondary | ICD-10-CM | POA: Diagnosis not present

## 2021-02-21 DIAGNOSIS — Z8261 Family history of arthritis: Secondary | ICD-10-CM | POA: Insufficient documentation

## 2021-02-21 DIAGNOSIS — I2510 Atherosclerotic heart disease of native coronary artery without angina pectoris: Secondary | ICD-10-CM | POA: Insufficient documentation

## 2021-02-21 DIAGNOSIS — D72829 Elevated white blood cell count, unspecified: Secondary | ICD-10-CM | POA: Diagnosis not present

## 2021-02-21 DIAGNOSIS — D649 Anemia, unspecified: Secondary | ICD-10-CM | POA: Diagnosis not present

## 2021-02-21 DIAGNOSIS — Z923 Personal history of irradiation: Secondary | ICD-10-CM | POA: Insufficient documentation

## 2021-02-21 DIAGNOSIS — Z801 Family history of malignant neoplasm of trachea, bronchus and lung: Secondary | ICD-10-CM | POA: Insufficient documentation

## 2021-02-21 DIAGNOSIS — I110 Hypertensive heart disease with heart failure: Secondary | ICD-10-CM | POA: Insufficient documentation

## 2021-02-21 DIAGNOSIS — E871 Hypo-osmolality and hyponatremia: Secondary | ICD-10-CM | POA: Diagnosis not present

## 2021-02-21 DIAGNOSIS — Z853 Personal history of malignant neoplasm of breast: Secondary | ICD-10-CM | POA: Insufficient documentation

## 2021-02-21 DIAGNOSIS — R630 Anorexia: Secondary | ICD-10-CM | POA: Insufficient documentation

## 2021-02-21 DIAGNOSIS — Z8349 Family history of other endocrine, nutritional and metabolic diseases: Secondary | ICD-10-CM | POA: Insufficient documentation

## 2021-02-21 DIAGNOSIS — Z881 Allergy status to other antibiotic agents status: Secondary | ICD-10-CM | POA: Insufficient documentation

## 2021-02-21 LAB — COMPREHENSIVE METABOLIC PANEL
ALT: 11 U/L (ref 0–44)
AST: 20 U/L (ref 15–41)
Albumin: 3.3 g/dL — ABNORMAL LOW (ref 3.5–5.0)
Alkaline Phosphatase: 84 U/L (ref 38–126)
Anion gap: 10 (ref 5–15)
BUN: 15 mg/dL (ref 8–23)
CO2: 28 mmol/L (ref 22–32)
Calcium: 7.5 mg/dL — ABNORMAL LOW (ref 8.9–10.3)
Chloride: 91 mmol/L — ABNORMAL LOW (ref 98–111)
Creatinine, Ser: 1.01 mg/dL — ABNORMAL HIGH (ref 0.44–1.00)
GFR, Estimated: 57 mL/min — ABNORMAL LOW (ref >60)
Glucose, Bld: 116 mg/dL — ABNORMAL HIGH (ref 70–99)
Potassium: 2.5 mmol/L — CL (ref 3.5–5.1)
Sodium: 129 mmol/L — ABNORMAL LOW (ref 135–145)
Total Bilirubin: 0.6 mg/dL (ref 0.3–1.2)
Total Protein: 6.6 g/dL (ref 6.5–8.1)

## 2021-02-21 LAB — CBC WITH DIFFERENTIAL/PLATELET
Abs Immature Granulocytes: 0.28 10*3/uL — ABNORMAL HIGH (ref 0.00–0.07)
Basophils Absolute: 0.1 10*3/uL (ref 0.0–0.1)
Basophils Relative: 1 %
Eosinophils Absolute: 0 10*3/uL (ref 0.0–0.5)
Eosinophils Relative: 0 %
HCT: 26.9 % — ABNORMAL LOW (ref 36.0–46.0)
Hemoglobin: 9.4 g/dL — ABNORMAL LOW (ref 12.0–15.0)
Immature Granulocytes: 2 %
Lymphocytes Relative: 11 %
Lymphs Abs: 1.4 10*3/uL (ref 0.7–4.0)
MCH: 32.1 pg (ref 26.0–34.0)
MCHC: 34.9 g/dL (ref 30.0–36.0)
MCV: 91.8 fL (ref 80.0–100.0)
Monocytes Absolute: 1.1 10*3/uL — ABNORMAL HIGH (ref 0.1–1.0)
Monocytes Relative: 9 %
Neutro Abs: 10.1 10*3/uL — ABNORMAL HIGH (ref 1.7–7.7)
Neutrophils Relative %: 77 %
Platelets: 131 10*3/uL — ABNORMAL LOW (ref 150–400)
RBC: 2.93 MIL/uL — ABNORMAL LOW (ref 3.87–5.11)
RDW: 13.8 % (ref 11.5–15.5)
WBC: 13 10*3/uL — ABNORMAL HIGH (ref 4.0–10.5)
nRBC: 0 % (ref 0.0–0.2)

## 2021-02-21 LAB — MAGNESIUM: Magnesium: 1 mg/dL — ABNORMAL LOW (ref 1.7–2.4)

## 2021-02-21 MED ORDER — SODIUM CHLORIDE 0.9 % IV SOLN
Freq: Once | INTRAVENOUS | Status: AC
  Filled 2021-02-21: qty 250

## 2021-02-21 MED ORDER — HEPARIN SOD (PORK) LOCK FLUSH 100 UNIT/ML IV SOLN
500.0000 [IU] | Freq: Once | INTRAVENOUS | Status: AC
  Administered 2021-02-21: 500 [IU] via INTRAVENOUS
  Filled 2021-02-21: qty 5

## 2021-02-21 MED ORDER — MAGNESIUM SULFATE 4 GM/100ML IV SOLN
4.0000 g | Freq: Once | INTRAVENOUS | Status: AC
  Administered 2021-02-21: 4 g via INTRAVENOUS
  Filled 2021-02-21: qty 100

## 2021-02-21 MED ORDER — DEXAMETHASONE 4 MG PO TABS: 4.0000 mg | ORAL_TABLET | Freq: Every day | ORAL | 2 refills | Status: DC

## 2021-02-21 MED ORDER — SODIUM CHLORIDE 0.9 % IV SOLN
Freq: Once | INTRAVENOUS | Status: AC
  Filled 2021-02-21: qty 20

## 2021-02-21 MED ORDER — HEPARIN SOD (PORK) LOCK FLUSH 100 UNIT/ML IV SOLN
INTRAVENOUS | Status: AC
  Filled 2021-02-21: qty 5

## 2021-02-21 NOTE — Progress Notes (Signed)
Pt here for follow up. Reports feeling more weak since last visit.

## 2021-02-26 NOTE — Progress Notes (Signed)
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  INTERVAL HISTORY: Patient returns to clinic today for further evaluation and consideration of cycle 3 of cisplatin and Taxotere.  She continues to have increased weakness and fatigue and has a new onset cough with wheezing.  Her diarrhea and appetite have improved.  She has no neurologic complaints.  She denies any recent fevers.  She has no chest pain, shortness of breath, or hemoptysis.  She denies any nausea, vomiting, constipation, or diarrhea.  She has no urinary complaints.  Patient offers no further specific complaints today.  REVIEW OF SYSTEMS:   Review of Systems  Constitutional:  Positive for malaise/fatigue. Negative for fever and weight loss.  Respiratory:  Positive for cough and wheezing. Negative for hemoptysis and shortness of breath.   Cardiovascular: Negative.  Negative for chest pain and leg swelling.  Gastrointestinal:  Negative for abdominal pain, diarrhea and nausea.  Genitourinary: Negative.  Negative for dysuria.  Musculoskeletal: Negative.  Negative for back pain.  Skin: Negative.  Negative for rash.  Neurological:  Positive for weakness. Negative for dizziness, focal weakness and headaches.  Psychiatric/Behavioral: Negative.  The patient is not nervous/anxious.    As per HPI. Otherwise, a complete review of systems is negative.  PAST MEDICAL HISTORY: Past Medical History:  Diagnosis Date   Arthritis    Breast cancer ([place]) 2011   radiation- Left   CHF (congestive heart failure) (HCC)     Coronary artery disease    Dyspnea    Hyperlipidemia    Hypertension    Myocardial infarction [facility])    Personal history of radiation therapy    PVD (peripheral vascular disease) ([place])     PAST SURGICAL HISTORY: Past Surgical History:  Procedure Laterality Date   ABDOMINAL HYSTERECTOMY     APPENDECTOMY     BREAST BIOPSY Left 12/21/2009   positive, radiation dcis   BREAST EXCISIONAL BIOPSY Left 01/17/2010   lumpectomy DCIS   BREAST LUMPECTOMY     CATARACT EXTRACTION W/ INTRAOCULAR LENS IMPLANT & ANTERIOR VITRECTOMY, BILATERAL     CHOLECYSTECTOMY     COLONOSCOPY WITH PROPOFOL N/A 09/25/2017   Procedure: COLONOSCOPY WITH PROPOFOL;  Surgeon: Lollie Sails, MD;  Location: [facility] ENDOSCOPY;  Service: Endoscopy;  Laterality: N/A;   CORONARY ARTERY BYPASS GRAFT N/A 05/03/2020   Procedure: CORONARY ARTERY BYPASS GRAFTING (CABG) X FOUR ON PUMP USING LEFT INTERNAL MAMMARY ARTERY AND RIGHT ENDOSCOPIC SAPHEOUS VEIN HARVEST CONDUITS;  Surgeon: Melrose Nakayama, MD;  Location: [place];  Service: Open Heart Surgery;  Laterality: N/A;   DILATION AND CURETTAGE OF UTERUS     EYE SURGERY     bilateral cataracts   INTERCOSTAL NERVE BLOCK Left 11/13/2020   Procedure: INTERCOSTAL NERVE BLOCK;  Surgeon: Melrose Nakayama, MD;  Location: [place];  Service: Thoracic;  Laterality: Left;   IR IMAGING GUIDED PORT INSERTION  01/12/2021   JOINT REPLACEMENT Right 01/24/2016   KNEE ARTHROPLASTY Right 01/24/2016   Procedure: COMPUTER ASSISTED TOTAL KNEE ARTHROPLASTY;  Surgeon: Dereck Leep, MD;  Location: [facility] ORS;  Service: Orthopedics;  Laterality: Right;   LEFT  HEART CATH AND CORONARY ANGIOGRAPHY N/A 05/01/2020   Procedure: LEFT HEART CATH AND CORONARY ANGIOGRAPHY;  Surgeon: Wellington Hampshire, MD;  Location: [place] CV LAB;  Service: Cardiovascular;  Laterality: N/A;   LUNG LOBECTOMY  10/2020   LYMPH NODE DISSECTION Left 11/13/2020   Procedure: LYMPH NODE DISSECTION;  Surgeon: Melrose Nakayama, MD;  Location: [facility] OR;  Service: Thoracic;  Laterality: Left;   RIGHT/LEFT HEART CATH AND CORONARY/GRAFT ANGIOGRAPHY N/A 07/27/2020   Procedure: RIGHT/LEFT HEART CATH AND CORONARY/GRAFT ANGIOGRAPHY;  Surgeon: Wellington Hampshire, MD;  Location: [place] CV LAB;  Service: Cardiovascular;  Laterality: N/A;   stent in Lt leg Left    TEE WITHOUT CARDIOVERSION N/A 05/03/2020   Procedure: TRANSESOPHAGEAL ECHOCARDIOGRAM (TEE);  Surgeon: Melrose Nakayama, MD;  Location: [place];  Service: Open Heart Surgery;  Laterality: N/A;   VIDEO BRONCHOSCOPY WITH ENDOBRONCHIAL NAVIGATION N/A 09/25/2020   Procedure: VIDEO BRONCHOSCOPY WITH ENDOBRONCHIAL NAVIGATION;  Surgeon: Melrose Nakayama, MD;  Location: [facility] OR;  Service: Thoracic;  Laterality: N/A;    FAMILY HISTORY: Family History  Problem Relation Age of Onset   Hypertension Mother    Arthritis Mother    Gout Mother    Lung cancer Father    Heart attack Sister     ADVANCED DIRECTIVES (Y/N):  N  HEALTH MAINTENANCE: Social History   Tobacco Use   Smoking status: Former   Smokeless tobacco: Never  Scientific laboratory technician Use: Never used  Substance Use Topics   Alcohol use: No    Alcohol/week: 0.0 standard drinks   Drug use: No     Colonoscopy:  PAP:  Bone density:  Lipid panel:  Allergies  Allergen Reactions   Ibuprofen Hives   Penicillins Hives    Tolerated Ancef in the past   Aleve [Naproxen Sodium] Hives   Avapro  [Irbesartan] Rash   Azithromycin Hives   Nickel Itching    Redness, itching and fluid discharge with nickel earrings    Current Outpatient Medications  Medication Sig Dispense Refill   acetaminophen (TYLENOL) 500 MG tablet Take 1,000 mg by mouth every 6 (six) hours as needed for moderate pain or headache.     aspirin EC 81 MG tablet Take 81 mg by mouth daily.     atorvastatin (LIPITOR) 20 MG tablet Take 1 tablet (20 mg total) by mouth at bedtime. 90 tablet 3   Calcium Carbonate-Vit D-Min (CALCIUM  600+D3 PLUS MINERALS) 600-800 MG-UNIT TABS Take 1 tablet by mouth daily.     chlorpheniramine-HYDROcodone (TUSSIONEX) 10-8 MG/5ML SUER Take 5 mLs by mouth every 12 (twelve) hours as needed for cough.     clopidogrel (PLAVIX) 75 MG tablet Take 1 tablet (75 mg total) by mouth daily. 90 tablet 3   dexamethasone (DECADRON) 4 MG tablet Take 1 tablet (4 mg total) by mouth daily. 30 tablet 2   diphenhydrAMINE-zinc acetate (BENADRYL) cream Apply 1 application topically daily as needed for itching.     diphenoxylate-atropine (LOMOTIL) 2.5-0.025 MG tablet Take 2 tablets by mouth 4 (four) times daily as needed for diarrhea or loose stools. 24 tablet 0   furosemide (LASIX) 40 MG tablet Take 1 tablet (40 mg total) by mouth daily. 30 tablet 0   gabapentin (NEURONTIN) 100 MG capsule Take 1 capsule (100 mg total) by mouth 3 (three) times daily. 90 capsule 3   lidocaine-prilocaine (EMLA) cream Apply 1 application topically as needed. Apply to port and cover with saran wrap 1-2 hours prior to  port access 30 g 1   Magnesium 500 MG TABS Take 1 tablet by mouth daily.     metoprolol succinate (TOPROL-XL) 50 MG 24 hr tablet Take 1 tablet (50 mg total) by mouth daily. Take with or immediately following a meal. 90 tablet 0   Multiple Vitamin (MULTIVITAMIN WITH MINERALS) TABS tablet Take 1 tablet by mouth daily.     nitroGLYCERIN (NITROSTAT) 0.4 MG SL tablet Place 1 tablet (0.4 mg total) under the tongue every 5 (five) minutes as needed for up to 10 days for chest pain. 10 tablet 0   ondansetron (ZOFRAN) 8 MG tablet Take 1 tablet (8 mg total) by mouth 2 (two) times daily as needed (Nausea or vomiting). 60 tablet 1   oxyCODONE (OXY IR/ROXICODONE) 5 MG immediate release tablet Take 1 tablet (5 mg total) by mouth 3 (three) times daily as needed for severe pain. 15 tablet 0   potassium chloride (KLOR-CON M) 10 MEQ tablet Take 2 tablets (20 mEq total) by mouth 2 (two) times daily. 120 tablet 2   sacubitril-valsartan (ENTRESTO)  24-26 MG Take 1 tablet by mouth 2 (two) times daily. 60 tablet 0   sacubitril-valsartan (ENTRESTO) 49-51 MG Take 1 tablet by mouth 2 (two) times daily. 180 tablet 3   No current facility-administered medications for this visit.   Facility-Administered Medications Ordered in Other Visits  Medication Dose Route Frequency Provider Last Rate Last Admin   0.9 %  sodium chloride infusion   Intravenous Once Grayland Ormond, Kathlene November, MD       heparin lock flush 100 UNIT/ML injection            heparin lock flush 100 unit/mL  500 Units Intracatheter Once PRN Lloyd Huger, MD       magnesium sulfate IVPB 4 g 100 mL  4 g Intravenous Once Lloyd Huger, MD       sodium chloride 0.9 % 250 mL with potassium chloride 40 mEq, magnesium sulfate infusion   Intravenous Once Lloyd Huger, MD       sodium chloride flush (NS) 0.9 % injection 10 mL  10 mL Intracatheter Once PRN Lloyd Huger, MD        OBJECTIVE: Vitals:   02/28/21 0837  BP: (!) 131/49  Pulse: (!) 105  Temp: 98.1 F (36.7 C)  SpO2: 97%       Body mass index is 20.96 kg/m.    ECOG FS:0 - Asymptomatic  General: Thin, no acute distress. Eyes: Pink conjunctiva, anicteric sclera. HEENT: Normocephalic, moist mucous membranes. Lungs: No audible wheezing or coughing. Heart: Regular rate and rhythm. Abdomen: Soft, nontender, no obvious distention. Musculoskeletal: No edema, cyanosis, or clubbing. Neuro: Alert, answering all questions appropriately. Cranial nerves grossly intact. Skin: No rashes or petechiae noted. Psych: Normal affect.  LAB RESULTS:  Lab Results  Component Value Date   NA 128 (L) 02/28/2021   K 3.2 (L) 02/28/2021   CL 94 (L) 02/28/2021   CO2 25 02/28/2021   GLUCOSE 107 (H) 02/28/2021   BUN 18 02/28/2021   CREATININE 0.83 02/28/2021   CALCIUM 7.9 (L) 02/28/2021   PROT 6.6 02/28/2021   ALBUMIN 2.8 (L) 02/28/2021   AST 31 02/28/2021   ALT 22 02/28/2021   ALKPHOS 93 02/28/2021   BILITOT 0.3  02/28/2021   GFRNONAA >60 02/28/2021   GFRAA >60 01/26/2016    Lab Results  Component Value Date   WBC 16.0 (H) 02/28/2021   NEUTROABS 13.1 (H) 02/28/2021  HGB 8.6 (L) 02/28/2021   HCT 25.1 (L) 02/28/2021   MCV 94.0 02/28/2021   PLT 290 02/28/2021     STUDIES: No results found.  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  MRI of the brain on October 19, 2020 was negative for disease.  Given her stage, patient will benefit from adjuvant chemotherapy using cisplatin and Taxotere every 3 weeks for 4 cycles with Udenyca support.  Patient has had port placement.  Delay cycle 3 of treatment today.  Proceed with IV fluids and electrolyte replacement.  Previously, Taxotere and cisplatin were dose reduced approximately 10%.  Return to clinic in 1 week for further evaluation and reconsideration of cycle 3. 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  Continue yearly screening mammograms. Cardiac disease: Continue follow-up with cardiology as scheduled. Anemia: Hemoglobin has trended down slightly to 8.6, monitor.   Hypokalemia: Potassium levels have improved to 3.2.  Continue oral potassium supplementation.  Patient will receive an additional 40 mEq of IV potassium today.   Hypomagnesia: Decreased, but mildly improved.  Continue oral supplementation.  Patient will also receive 4 g IV magnesium today. Renal insufficiency: Resolved.   Nausea: Improved.   Leukocytosis: Likely reactive secondary to Udenyca. Hyponatremia: Chronic and unchanged.  Monitor.   Diarrhea.  Resolved.   Poor appetite: Improved.  Continue Decadron as prescribed. Cough/wheezing: Will get chest x-ray today.  Patient has also been instructed to take an at home COVID test.  Patient  expressed understanding and was in agreement with this plan. She also understands that She can call clinic at any time with any questions, concerns, or complaints.    Cancer Staging  Primary squamous cell carcinoma of lower lobe of left lung [facility]) Staging form: Lung, AJCC 8th Edition - Pathologic stage from 12/21/2020: Stage IIIB (ypT3, pN2, cM0) - Signed by Lloyd Huger, MD on 12/21/2020 Stage prefix: Post-therapy   Lloyd Huger, MD   02/28/2021 9:20 AM

## 2021-02-28 ENCOUNTER — Encounter: Payer: Self-pay | Admitting: Oncology

## 2021-02-28 ENCOUNTER — Ambulatory Visit
Admission: RE | Admit: 2021-02-28 | Discharge: 2021-02-28 | Disposition: A | Discharge status: Home / Self Care | Payer: Medicare Other | Source: Ambulatory Visit | Attending: Oncology | Admitting: Oncology

## 2021-02-28 ENCOUNTER — Inpatient Hospital Stay (HOSPITAL_BASED_OUTPATIENT_CLINIC_OR_DEPARTMENT_OTHER): Payer: Medicare Other | Admitting: Oncology

## 2021-02-28 ENCOUNTER — Other Ambulatory Visit: Payer: Self-pay

## 2021-02-28 ENCOUNTER — Inpatient Hospital Stay: Payer: Medicare Other

## 2021-02-28 VITALS — BP 131/49 | HR 105 | Temp 98.1°F | Wt 107.3 lb

## 2021-02-28 VITALS — Resp 20

## 2021-02-28 DIAGNOSIS — C3432 Malignant neoplasm of lower lobe, left bronchus or lung: Secondary | ICD-10-CM

## 2021-02-28 LAB — CBC WITH DIFFERENTIAL/PLATELET
Abs Immature Granulocytes: 0.12 10*3/uL — ABNORMAL HIGH (ref 0.00–0.07)
Basophils Absolute: 0.1 10*3/uL (ref 0.0–0.1)
Basophils Relative: 0 %
Eosinophils Absolute: 0 10*3/uL (ref 0.0–0.5)
Eosinophils Relative: 0 %
HCT: 25.1 % — ABNORMAL LOW (ref 36.0–46.0)
Hemoglobin: 8.6 g/dL — ABNORMAL LOW (ref 12.0–15.0)
Immature Granulocytes: 1 %
Lymphocytes Relative: 8 %
Lymphs Abs: 1.3 10*3/uL (ref 0.7–4.0)
MCH: 32.2 pg (ref 26.0–34.0)
MCHC: 34.3 g/dL (ref 30.0–36.0)
MCV: 94 fL (ref 80.0–100.0)
Monocytes Absolute: 1.4 10*3/uL — ABNORMAL HIGH (ref 0.1–1.0)
Monocytes Relative: 9 %
Neutro Abs: 13.1 10*3/uL — ABNORMAL HIGH (ref 1.7–7.7)
Neutrophils Relative %: 82 %
Platelets: 290 10*3/uL (ref 150–400)
RBC: 2.67 MIL/uL — ABNORMAL LOW (ref 3.87–5.11)
RDW: 14.4 % (ref 11.5–15.5)
WBC: 16 10*3/uL — ABNORMAL HIGH (ref 4.0–10.5)
nRBC: 0 % (ref 0.0–0.2)

## 2021-02-28 LAB — COMPREHENSIVE METABOLIC PANEL
ALT: 22 U/L (ref 0–44)
AST: 31 U/L (ref 15–41)
Albumin: 2.8 g/dL — ABNORMAL LOW (ref 3.5–5.0)
Alkaline Phosphatase: 93 U/L (ref 38–126)
Anion gap: 9 (ref 5–15)
BUN: 18 mg/dL (ref 8–23)
CO2: 25 mmol/L (ref 22–32)
Calcium: 7.9 mg/dL — ABNORMAL LOW (ref 8.9–10.3)
Chloride: 94 mmol/L — ABNORMAL LOW (ref 98–111)
Creatinine, Ser: 0.83 mg/dL (ref 0.44–1.00)
GFR, Estimated: >60 mL/min (ref >60)
Glucose, Bld: 107 mg/dL — ABNORMAL HIGH (ref 70–99)
Potassium: 3.2 mmol/L — ABNORMAL LOW (ref 3.5–5.1)
Sodium: 128 mmol/L — ABNORMAL LOW (ref 135–145)
Total Bilirubin: 0.3 mg/dL (ref 0.3–1.2)
Total Protein: 6.6 g/dL (ref 6.5–8.1)

## 2021-02-28 LAB — MAGNESIUM: Magnesium: 1.2 mg/dL — ABNORMAL LOW (ref 1.7–2.4)

## 2021-02-28 MED ORDER — SODIUM CHLORIDE 0.9% FLUSH
10.0000 mL | Freq: Once | INTRAVENOUS | Status: AC | PRN
  Administered 2021-02-28: 10 mL
  Filled 2021-02-28: qty 10

## 2021-02-28 MED ORDER — SODIUM CHLORIDE 0.9 % IV SOLN
Freq: Once | INTRAVENOUS | Status: AC
  Filled 2021-02-28: qty 250

## 2021-02-28 MED ORDER — HEPARIN SOD (PORK) LOCK FLUSH 100 UNIT/ML IV SOLN
INTRAVENOUS | Status: AC
  Administered 2021-02-28: 500 [IU]
  Filled 2021-02-28: qty 5

## 2021-02-28 MED ORDER — MAGNESIUM SULFATE 4 GM/100ML IV SOLN
4.0000 g | Freq: Once | INTRAVENOUS | Status: AC
  Administered 2021-02-28: 4 g via INTRAVENOUS
  Filled 2021-02-28: qty 100

## 2021-02-28 MED ORDER — SODIUM CHLORIDE 0.9 % IV SOLN
40.0000 meq | Freq: Once | INTRAVENOUS | Status: AC
  Administered 2021-02-28: 40 meq via INTRAVENOUS
  Filled 2021-02-28: qty 20

## 2021-02-28 MED ORDER — HEPARIN SOD (PORK) LOCK FLUSH 100 UNIT/ML IV SOLN
500.0000 [IU] | Freq: Once | INTRAVENOUS | Status: AC | PRN
  Filled 2021-02-28: qty 5

## 2021-02-28 NOTE — Progress Notes (Signed)
Nutrition Follow-up:  Patient with SCC of left lung.  Patient receiving adjuvant chemotherapy (cisplatin and taxotere).    Met with patient during infusion.  Patient reports that appetite is better.  Taking decadron each morning.  Reports that she has been eating green beans, mashed potatoes, meatloaf, egg sandwiches, sausage and ham biscuits, juice, trail mix (no nuts), cheeseits, cheese and crackers, macaroni and cheese, Frosty's, cake.  Has not tried oral nutrition supplement shakes.  Says that she only gets nauseated thinking about eating potato chips.  "I use to love potatoes chips but can't eat them now."  Reports some diarrhea but improving.      Medications: decadron  Labs: Na 128, K 3.2, glucose 107, Mag 1.2  Anthropometrics:   Weight 107 lb 4.8 oz today  105 lb on 2/8 110 lb on 1/25 112 lb on 1/4 117 lb on 10/19   NUTRITION DIAGNOSIS: Inadequate oral intake improving    INTERVENTION:  Reviewed ways to add calories and protein to diet.  Patient says she will try oral nutrition supplement when she gets home today.  "I don't know why I have not tried them."      MONITORING, EVALUATION, GOAL: weight trends, intake   NEXT VISIT: to be determined with treatment  Hunner Garcon B. Zenia Resides, [place], [place] Registered Dietitian [phone] (mobile)

## 2021-02-28 NOTE — Patient Instructions (Signed)
[facility] AT [place]   Discharge Instructions: Thank you for choosing [facility] to provide your oncology and hematology care.  If you have a lab appointment with the [facility], please go directly to the [facility] and check in at the registration area.   Wear comfortable clothing and clothing appropriate for easy access to any Portacath or PICC line.   We strive to give you quality time with your provider. You may need to reschedule your appointment if you arrive late (15 or more minutes).  Arriving late affects you and other patients whose appointments are after yours.  Also, if you miss three or more appointments without notifying the office, you may be dismissed from the clinic at the providers discretion.      For prescription refill requests, have your pharmacy contact our office and allow 72 hours for refills to be completed.    Today you received the following: Potassium Chloride infusion and Magnesium Sulfate infusion.      To help prevent nausea and vomiting after your treatment, we encourage you to take your nausea medication as directed.  BELOW ARE SYMPTOMS THAT SHOULD BE REPORTED IMMEDIATELY: *FEVER GREATER THAN 100.4 F (38 C) OR HIGHER *CHILLS OR SWEATING *NAUSEA AND VOMITING THAT IS NOT CONTROLLED WITH YOUR NAUSEA MEDICATION *UNUSUAL SHORTNESS OF BREATH *UNUSUAL BRUISING OR BLEEDING *URINARY PROBLEMS (pain or burning when urinating, or frequent urination) *BOWEL PROBLEMS (unusual diarrhea, constipation, pain near the anus) TENDERNESS IN MOUTH AND THROAT WITH OR WITHOUT PRESENCE OF ULCERS (sore throat, sores in mouth, or a toothache) UNUSUAL RASH, SWELLING OR PAIN  UNUSUAL VAGINAL DISCHARGE OR ITCHING   Items with * indicate a potential emergency and should be followed up as soon as possible or go to the Emergency Department if any problems should occur.  Please show the CHEMOTHERAPY ALERT CARD or IMMUNOTHERAPY ALERT CARD at  check-in to the Emergency Department and triage nurse.  Should you have questions after your visit or need to cancel or reschedule your appointment, please contact [facility] AT [place]  Dept: [phone]  and follow the prompts.  Office hours are 8:00 a.m. to 4:30 p.m. Monday - Friday. Please note that voicemails left after 4:00 p.m. may not be returned until the following business day.  We are closed weekends and major holidays. You have access to a nurse at all times for urgent questions. Please call the main number to the clinic Dept: [phone] and follow the prompts.  For any non-urgent questions, you may also contact your provider using MyChart. We now offer e-Visits for anyone 73 and older to request care online for non-urgent symptoms. For details visit [email or web address].   Also download the MyChart app! Go to the app store, search "MyChart", open the app, select [facility], and log in with your MyChart username and password.  Due to Covid, a mask is required upon entering the hospital/clinic. If you do not have a mask, one will be given to you upon arrival. For doctor visits, patients may have 1 support person aged 39 or older with them. For treatment visits, patients cannot have anyone with them due to current Covid guidelines and our immunocompromised population.

## 2021-02-28 NOTE — Progress Notes (Signed)
Pt is having SOB and a cough and she has had this about 2 weeks.

## 2021-03-01 ENCOUNTER — Telehealth: Payer: Self-pay | Admitting: Physician Assistant

## 2021-03-01 NOTE — Telephone Encounter (Signed)
Spoke with daughter and reviewed her concerns. Patient has cough, wheezing, and malaise. Inquired if she had called primary care and she has not. Encouraged her to give them a call so she can be seen and evaluated. Patient has had no weight gain and no other cardiac symptoms. She was very appreciative for the call back. She verbalized understanding of our conversation with no further questions.

## 2021-03-01 NOTE — Telephone Encounter (Signed)
Pt c/o Shortness Of Breath: STAT if SOB developed within the last 24 hours or pt is noticeably SOB on the phone  1. Are you currently SOB (can you hear that pt is SOB on the phone)? Unknown daughter calling   2. How long have you been experiencing SOB? 1-2 weeks started as cough wheezing   3. Are you SOB when sitting or when up moving around? Mostly SOBE recovers with rest but takes a while   4. Are you currently experiencing any other symptoms?  Productive cough wheezing   Per daughter cancer center is aware ordered meds and testing  but did advise her to call cards for notification.   Secondary contact for daughter is [phone]

## 2021-03-05 NOTE — Progress Notes (Signed)
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  INTERVAL HISTORY: Patient returns to clinic today for further evaluation and reconsideration of cycle 3 of cisplatin and Taxotere.  She feels improved since last week, but continues to have weakness and fatigue.  She is now on antibiotics and prednisone taper.  She does not complain of diarrhea today.  Her appetite has improved, but not back to baseline.  She has no neurologic complaints.  She denies any recent fevers.  She has no chest pain, shortness of breath, or hemoptysis.  She denies any nausea, vomiting, constipation, or diarrhea.  She has no urinary complaints.  Patient offers no further specific complaints today.  REVIEW OF SYSTEMS:   Review of Systems  Constitutional:  Positive for malaise/fatigue. Negative for fever and weight loss.  Respiratory:  Positive for cough and wheezing. Negative for hemoptysis and shortness of breath.   Cardiovascular: Negative.  Negative for chest pain and leg swelling.  Gastrointestinal:  Negative for abdominal pain, diarrhea and nausea.  Genitourinary: Negative.  Negative for dysuria.  Musculoskeletal: Negative.  Negative for back pain.  Skin: Negative.  Negative for rash.  Neurological:  Positive for weakness. Negative for dizziness, focal weakness and headaches.  Psychiatric/Behavioral: Negative.  The patient is not nervous/anxious.    As per HPI. Otherwise, a complete review of systems is negative.  PAST MEDICAL HISTORY: Past Medical History:  Diagnosis Date   Arthritis     Breast cancer ([place]) 2011   radiation- Left   CHF (congestive heart failure) (H[facility])    Coronary artery disease    Dyspnea    Hyperlipidemia    Hypertension    Myocardial infarction [facility])    Personal history of radiation therapy    PVD (peripheral vascular disease) ([place])     PAST SURGICAL HISTORY: Past Surgical History:  Procedure Laterality Date   ABDOMINAL HYSTERECTOMY     APPENDECTOMY     BREAST BIOPSY Left 12/21/2009   positive, radiation dcis   BREAST EXCISIONAL BIOPSY Left 01/17/2010   lumpectomy DCIS   BREAST LUMPECTOMY     CATARACT EXTRACTION W/ INTRAOCULAR LENS IMPLANT & ANTERIOR VITRECTOMY, BILATERAL     CHOLECYSTECTOMY     COLONOSCOPY WITH PROPOFOL N/A 09/25/2017   Procedure: COLONOSCOPY WITH PROPOFOL;  Surgeon: Lollie Sails, MD;  Location: [facility] ENDOSCOPY;  Service: Endoscopy;  Laterality: N/A;   CORONARY ARTERY BYPASS GRAFT N/A 05/03/2020   Procedure: CORONARY ARTERY BYPASS GRAFTING (CABG) X FOUR ON PUMP USING LEFT INTERNAL MAMMARY ARTERY AND RIGHT ENDOSCOPIC SAPHEOUS VEIN HARVEST CONDUITS;  Surgeon: Melrose Nakayama, MD;  Location: [place];  Service: Open Heart Surgery;  Laterality: N/A;   DILATION AND CURETTAGE OF UTERUS     EYE SURGERY     bilateral cataracts   INTERCOSTAL NERVE BLOCK Left 11/13/2020   Procedure: INTERCOSTAL NERVE BLOCK;  Surgeon: Melrose Nakayama, MD;  Location: [place];  Service: Thoracic;  Laterality: Left;   IR IMAGING GUIDED PORT INSERTION  01/12/2021   JOINT REPLACEMENT Right 01/24/2016   KNEE ARTHROPLASTY Right 01/24/2016   Procedure: COMPUTER ASSISTED TOTAL KNEE ARTHROPLASTY;  Surgeon:  Dereck Leep, MD;  Location: [facility] ORS;  Service: Orthopedics;  Laterality: Right;   LEFT HEART CATH AND CORONARY ANGIOGRAPHY N/A 05/01/2020   Procedure: LEFT HEART CATH AND CORONARY ANGIOGRAPHY;  Surgeon: Wellington Hampshire, MD;  Location: [place] CV LAB;  Service: Cardiovascular;  Laterality: N/A;   LUNG LOBECTOMY  10/2020   LYMPH NODE  DISSECTION Left 11/13/2020   Procedure: LYMPH NODE DISSECTION;  Surgeon: Melrose Nakayama, MD;  [facility];  Service: Thoracic;  Laterality: Left;   RIGHT/LEFT HEART CATH AND CORONARY/GRAFT ANGIOGRAPHY N/A 07/27/2020   Procedure: RIGHT/LEFT HEART CATH AND CORONARY/GRAFT ANGIOGRAPHY;  Surgeon: Wellington Hampshire, MD;  Location: [place] CV LAB;  Service: Cardiovascular;  Laterality: N/A;   stent in Lt leg Left    TEE WITHOUT CARDIOVERSION N/A 05/03/2020   Procedure: TRANSESOPHAGEAL ECHOCARDIOGRAM (TEE);  Surgeon: Melrose Nakayama, MD;  Location: [place];  Service: Open Heart Surgery;  Laterality: N/A;   VIDEO BRONCHOSCOPY WITH ENDOBRONCHIAL NAVIGATION N/A 09/25/2020   Procedure: VIDEO BRONCHOSCOPY WITH ENDOBRONCHIAL NAVIGATION;  Surgeon: Melrose Nakayama, MD;  Location: [facility] OR;  Service: Thoracic;  Laterality: N/A;    FAMILY HISTORY: Family History  Problem Relation Age of Onset   Hypertension Mother    Arthritis Mother    Gout Mother    Lung cancer Father    Heart attack Sister     ADVANCED DIRECTIVES (Y/N):  N  HEALTH MAINTENANCE: Social History   Tobacco Use   Smoking status: Former   Smokeless tobacco: Never  Scientific laboratory technician Use: Never used  Substance Use Topics   Alcohol use: No    Alcohol/week: 0.0 standard drinks   Drug use: No     Colonoscopy:  PAP:  Bone density:  Lipid panel:  Allergies  Allergen Reactions   Ibuprofen Hives   Penicillins Hives    Tolerated Ancef in the past   Aleve [Naproxen Sodium] Hives   Avapro  [Irbesartan] Rash   Azithromycin Hives   Nickel Itching    Redness, itching and fluid discharge with nickel earrings    Current Outpatient Medications  Medication Sig Dispense Refill   acetaminophen (TYLENOL) 500 MG tablet Take 1,000 mg by mouth every 6 (six) hours as needed for moderate pain or headache.     aspirin EC 81 MG tablet Take 81 mg by mouth daily.     atorvastatin (LIPITOR) 20 MG tablet Take 1 tablet  (20 mg total) by mouth at bedtime. 90 tablet 3   Calcium Carbonate-Vit D-Min (CALCIUM 600+D3 PLUS MINERALS) 600-800 MG-UNIT TABS Take 1 tablet by mouth daily.     chlorpheniramine-HYDROcodone (TUSSIONEX) 10-8 MG/5ML SUER Take 5 mLs by mouth every 12 (twelve) hours as needed for cough.     clopidogrel (PLAVIX) 75 MG tablet Take 1 tablet (75 mg total) by mouth daily. 90 tablet 3   dexamethasone (DECADRON) 4 MG tablet Take 1 tablet (4 mg total) by mouth daily. 30 tablet 2   diphenhydrAMINE-zinc acetate (BENADRYL) cream Apply 1 application topically daily as needed for itching.     diphenoxylate-atropine (LOMOTIL) 2.5-0.025 MG tablet Take 2 tablets by mouth 4 (four) times daily as needed for diarrhea or loose stools. 24 tablet 0   doxycycline (VIBRA-TABS) 100 MG tablet Take by mouth.     furosemide (LASIX) 40 MG tablet Take 1 tablet (40 mg total) by mouth daily. 30 tablet 0   gabapentin (NEURONTIN) 100 MG capsule Take 1 capsule (100 mg total) by mouth 3 (  three) times daily. 90 capsule 3   lidocaine-prilocaine (EMLA) cream Apply 1 application topically as needed. Apply to port and cover with saran wrap 1-2 hours prior to port access 30 g 1   Magnesium 500 MG TABS Take 1 tablet by mouth daily.     metoprolol succinate (TOPROL-XL) 50 MG 24 hr tablet Take 1 tablet (50 mg total) by mouth daily. Take with or immediately following a meal. 90 tablet 0   Multiple Vitamin (MULTIVITAMIN WITH MINERALS) TABS tablet Take 1 tablet by mouth daily.     nitroGLYCERIN (NITROSTAT) 0.4 MG SL tablet Place 1 tablet (0.4 mg total) under the tongue every 5 (five) minutes as needed for up to 10 days for chest pain. 10 tablet 0   ondansetron (ZOFRAN) 8 MG tablet Take 1 tablet (8 mg total) by mouth 2 (two) times daily as needed (Nausea or vomiting). 60 tablet 1   oxyCODONE (OXY IR/ROXICODONE) 5 MG immediate release tablet Take 1 tablet (5 mg total) by mouth 3 (three) times daily as needed for severe pain. 15 tablet 0   potassium  chloride (KLOR-CON M) 10 MEQ tablet Take 2 tablets (20 mEq total) by mouth 2 (two) times daily. 120 tablet 2   predniSONE (DELTASONE) 10 MG tablet Take by mouth.     sacubitril-valsartan (ENTRESTO) 24-26 MG Take 1 tablet by mouth 2 (two) times daily. 60 tablet 0   sacubitril-valsartan (ENTRESTO) 49-51 MG Take 1 tablet by mouth 2 (two) times daily. 180 tablet 3   No current facility-administered medications for this visit.   Facility-Administered Medications Ordered in Other Visits  Medication Dose Route Frequency Provider Last Rate Last Admin   heparin lock flush 100 UNIT/ML injection             OBJECTIVE: Vitals:   03/07/21 0840  BP: 134/62  Pulse: 99  Resp: 18  Temp: (!) 96.4 F (35.8 C)  SpO2: 98%       Body mass index is 20.15 kg/m.    ECOG FS:0 - Asymptomatic  General: Thin, no acute distress. Eyes: Pink conjunctiva, anicteric sclera. HEENT: Normocephalic, moist mucous membranes. Lungs: No audible wheezing or coughing. Heart: Regular rate and rhythm. Abdomen: Soft, nontender, no obvious distention. Musculoskeletal: No edema, cyanosis, or clubbing. Neuro: Alert, answering all questions appropriately. Cranial nerves grossly intact. Skin: No rashes or petechiae noted. Psych: Normal affect.   LAB RESULTS:  Lab Results  Component Value Date   NA 128 (L) 03/07/2021   K 4.1 03/07/2021   CL 96 (L) 03/07/2021   CO2 24 03/07/2021   GLUCOSE 111 (H) 03/07/2021   BUN 18 03/07/2021   CREATININE 0.87 03/07/2021   CALCIUM 8.8 (L) 03/07/2021   PROT 7.1 03/07/2021   ALBUMIN 2.8 (L) 03/07/2021   AST 29 03/07/2021   ALT 17 03/07/2021   ALKPHOS 88 03/07/2021   BILITOT 0.7 03/07/2021   GFRNONAA >60 03/07/2021   GFRAA >60 01/26/2016    Lab Results  Component Value Date   WBC 21.2 (H) 03/07/2021   NEUTROABS 17.8 (H) 03/07/2021   HGB 9.2 (L) 03/07/2021   HCT 27.2 (L) 03/07/2021   MCV 95.1 03/07/2021   PLT 384 03/07/2021     STUDIES: DG Chest 2 View  Result Date:  02/28/2021 CLINICAL DATA:  Lung cancer, primary squamous cell carcinoma of the left lung. EXAM: CHEST - 2 VIEW COMPARISON:  Chest radiograph dated January 02, 2021 FINDINGS: The heart is normal in size. Atherosclerotic calcification of aortic arch. Evidence of  prior coronary artery bypass grafting. Right IJ access Port-A-Cath. Right lung is clear. Postsurgical changes in the left lung, unchanged. Left basilar opacity with small left pleural effusion, unchanged. IMPRESSION: 1.  Interval placement of right IJ access Port-A-Cath. 2. Left basilar opacity with small left pleural effusion, unchanged. Electronically Signed   By: Keane Police D.O.   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  MRI of the brain on October 19, 2020 was negative for disease.  Given her stage, patient will benefit from adjuvant chemotherapy using cisplatin and Taxotere every 3 weeks for 4 cycles with Udenyca support.  Patient has had port placement.  Delay cycle 3 once again today.  Patient has agreed to 4 g of IV magnesium and IV fluids.  Previously, Taxotere and cisplatin were dose reduced approximately 10%.  Return to clinic in 1 week for further evaluation and reconsideration of cycle 3.   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  Continue yearly screening mammograms. Cardiac disease: Continue follow-up with cardiology as scheduled. Anemia: Hemoglobin has trended up slightly to 9.2, monitor. Hypokalemia: Resolved.  Continue oral potassium supplementation.     Hypomagnesia: Decreased, but mildly improved.  Continue oral supplementation.  Proceed with 4 g IV magnesium as above.   Renal insufficiency: Resolved.   Nausea: Improved.   Leukocytosis: Secondary to  prednisone. Hyponatremia: Chronic and unchanged.  Monitor.   Diarrhea.  Resolved.   Poor appetite: Improved.  Continue Decadron as prescribed. Cough/wheezing: Patient now on antibiotics and prednisone taper.  Home COVID test was negative.  Patient expressed understanding and was in agreement with this plan. She also understands that She can call clinic at any time with any questions, concerns, or complaints.    Cancer Staging  Primary squamous cell carcinoma of lower lobe of left lung [facility]) Staging form: Lung, AJCC 8th Edition - Pathologic stage from 12/21/2020: Stage IIIB (ypT3, pN2, cM0) - Signed by Lloyd Huger, MD on 12/21/2020 Stage prefix: Post-therapy   Lloyd Huger, MD   03/08/2021 8:59 AM

## 2021-03-07 ENCOUNTER — Inpatient Hospital Stay: Payer: Medicare Other

## 2021-03-07 ENCOUNTER — Encounter: Payer: Self-pay | Admitting: Oncology

## 2021-03-07 ENCOUNTER — Inpatient Hospital Stay (HOSPITAL_BASED_OUTPATIENT_CLINIC_OR_DEPARTMENT_OTHER): Payer: Medicare Other | Admitting: Oncology

## 2021-03-07 ENCOUNTER — Other Ambulatory Visit: Payer: Self-pay

## 2021-03-07 VITALS — BP 134/62 | HR 99 | Temp 96.4°F | Resp 18 | Wt 103.2 lb

## 2021-03-07 VITALS — BP 106/45 | HR 101 | Resp 18

## 2021-03-07 DIAGNOSIS — C3432 Malignant neoplasm of lower lobe, left bronchus or lung: Secondary | ICD-10-CM

## 2021-03-07 LAB — CBC WITH DIFFERENTIAL/PLATELET
Abs Immature Granulocytes: 0.5 10*3/uL — ABNORMAL HIGH (ref 0.00–0.07)
Basophils Absolute: 0.1 10*3/uL (ref 0.0–0.1)
Basophils Relative: 0 %
Eosinophils Absolute: 0.1 10*3/uL (ref 0.0–0.5)
Eosinophils Relative: 0 %
HCT: 27.2 % — ABNORMAL LOW (ref 36.0–46.0)
Hemoglobin: 9.2 g/dL — ABNORMAL LOW (ref 12.0–15.0)
Immature Granulocytes: 2 %
Lymphocytes Relative: 7 %
Lymphs Abs: 1.5 10*3/uL (ref 0.7–4.0)
MCH: 32.2 pg (ref 26.0–34.0)
MCHC: 33.8 g/dL (ref 30.0–36.0)
MCV: 95.1 fL (ref 80.0–100.0)
Monocytes Absolute: 1.2 10*3/uL — ABNORMAL HIGH (ref 0.1–1.0)
Monocytes Relative: 6 %
Neutro Abs: 17.8 10*3/uL — ABNORMAL HIGH (ref 1.7–7.7)
Neutrophils Relative %: 85 %
Platelets: 384 10*3/uL (ref 150–400)
RBC: 2.86 MIL/uL — ABNORMAL LOW (ref 3.87–5.11)
RDW: 14.9 % (ref 11.5–15.5)
WBC: 21.2 10*3/uL — ABNORMAL HIGH (ref 4.0–10.5)
nRBC: 0 % (ref 0.0–0.2)

## 2021-03-07 LAB — COMPREHENSIVE METABOLIC PANEL
ALT: 17 U/L (ref 0–44)
AST: 29 U/L (ref 15–41)
Albumin: 2.8 g/dL — ABNORMAL LOW (ref 3.5–5.0)
Alkaline Phosphatase: 88 U/L (ref 38–126)
Anion gap: 8 (ref 5–15)
BUN: 18 mg/dL (ref 8–23)
CO2: 24 mmol/L (ref 22–32)
Calcium: 8.8 mg/dL — ABNORMAL LOW (ref 8.9–10.3)
Chloride: 96 mmol/L — ABNORMAL LOW (ref 98–111)
Creatinine, Ser: 0.87 mg/dL (ref 0.44–1.00)
GFR, Estimated: >60 mL/min (ref >60)
Glucose, Bld: 111 mg/dL — ABNORMAL HIGH (ref 70–99)
Potassium: 4.1 mmol/L (ref 3.5–5.1)
Sodium: 128 mmol/L — ABNORMAL LOW (ref 135–145)
Total Bilirubin: 0.7 mg/dL (ref 0.3–1.2)
Total Protein: 7.1 g/dL (ref 6.5–8.1)

## 2021-03-07 LAB — MAGNESIUM: Magnesium: 1.5 mg/dL — ABNORMAL LOW (ref 1.7–2.4)

## 2021-03-07 MED ORDER — MAGNESIUM SULFATE 4 GM/100ML IV SOLN
4.0000 g | Freq: Once | INTRAVENOUS | Status: AC
  Administered 2021-03-07: 4 g via INTRAVENOUS
  Filled 2021-03-07: qty 100

## 2021-03-07 MED ORDER — HEPARIN SOD (PORK) LOCK FLUSH 100 UNIT/ML IV SOLN
500.0000 [IU] | Freq: Once | INTRAVENOUS | Status: AC | PRN
  Filled 2021-03-07: qty 5

## 2021-03-07 MED ORDER — SODIUM CHLORIDE 0.9 % IV SOLN
Freq: Once | INTRAVENOUS | Status: AC
  Filled 2021-03-07: qty 250

## 2021-03-07 MED ORDER — HEPARIN SOD (PORK) LOCK FLUSH 100 UNIT/ML IV SOLN
INTRAVENOUS | Status: AC
  Administered 2021-03-07: 500 [IU]
  Filled 2021-03-07: qty 5

## 2021-03-07 MED ORDER — SODIUM CHLORIDE 0.9% FLUSH
10.0000 mL | Freq: Once | INTRAVENOUS | Status: AC | PRN
  Administered 2021-03-07: 10 mL
  Filled 2021-03-07: qty 10

## 2021-03-07 NOTE — Progress Notes (Signed)
Pt reports SOB with exertion and has a productive cough. Recently started on prednisone, doxycycline and tussinex for same. Pt reports no appetite

## 2021-03-07 NOTE — Patient Instructions (Signed)
[facility] [place]   Discharge Instructions: Thank you for choosing [facility] to provide your oncology and hematology care.  If you have a lab appointment with the [facility], please go directly to the [facility] and check in at the registration area.   Wear comfortable clothing and clothing appropriate for easy access to any Portacath or PICC line.   We strive to give you quality time with your provider. You may need to reschedule your appointment if you arrive late (15 or more minutes).  Arriving late affects you and other patients whose appointments are after yours.  Also, if you miss three or more appointments without notifying the office, you may be dismissed from the clinic at the providers discretion.      For prescription refill requests, have your pharmacy contact our office and allow 72 hours for refills to be completed.    Today you received the following: Magnesium Sulfate infusion.      To help prevent nausea and vomiting after your treatment, we encourage you to take your nausea medication as directed.  BELOW ARE SYMPTOMS THAT SHOULD BE REPORTED IMMEDIATELY: *FEVER GREATER THAN 100.4 F (38 C) OR HIGHER *CHILLS OR SWEATING *NAUSEA AND VOMITING THAT IS NOT CONTROLLED WITH YOUR NAUSEA MEDICATION *UNUSUAL SHORTNESS OF BREATH *UNUSUAL BRUISING OR BLEEDING *URINARY PROBLEMS (pain or burning when urinating, or frequent urination) *BOWEL PROBLEMS (unusual diarrhea, constipation, pain near the anus) TENDERNESS IN MOUTH AND THROAT WITH OR WITHOUT PRESENCE OF ULCERS (sore throat, sores in mouth, or a toothache) UNUSUAL RASH, SWELLING OR PAIN  UNUSUAL VAGINAL DISCHARGE OR ITCHING   Items with * indicate a potential emergency and should be followed up as soon as possible or go to the Emergency Department if any problems should occur.  Please show the CHEMOTHERAPY ALERT CARD or IMMUNOTHERAPY ALERT CARD at check-in to the Emergency  Department and triage nurse.  Should you have questions after your visit or need to cancel or reschedule your appointment, please contact [facility] [place]  Dept: [phone]  and follow the prompts.  Office hours are 8:00 a.m. to 4:30 p.m. Monday - Friday. Please note that voicemails left after 4:00 p.m. may not be returned until the following business day.  We are closed weekends and major holidays. You have access to a nurse at all times for urgent questions. Please call the main number to the clinic Dept: [phone] and follow the prompts.  For any non-urgent questions, you may also contact your provider using MyChart. We now offer e-Visits for anyone 26 and older to request care online for non-urgent symptoms. For details visit [email or web address].   Also download the MyChart app! Go to the app store, search "MyChart", open the app, select [facility], and log in with your MyChart username and password.  Due to Covid, a mask is required upon entering the hospital/clinic. If you do not have a mask, one will be given to you upon arrival. For doctor visits, patients may have 1 support person aged 22 or older with them. For treatment visits, patients cannot have anyone with them due to current Covid guidelines and our immunocompromised population.

## 2021-03-08 ENCOUNTER — Encounter: Payer: Self-pay | Admitting: Oncology

## 2021-03-09 ENCOUNTER — Inpatient Hospital Stay: Payer: Medicare Other

## 2021-03-09 NOTE — Progress Notes (Signed)
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  INTERVAL HISTORY: Patient returns to clinic today for further evaluation and reconsideration of cisplatin and Taxotere.  Her performance status continues to be decreased with persistent weakness and fatigue.  She has a poor appetite and continues to lose weight.  She also has persistent nausea.  Her cough and shortness of breath are unresolved.  She has no neurologic complaints.  She denies any recent fevers.  She has no chest pain or hemoptysis.  She denies any vomiting, constipation, or diarrhea.  She has no urinary complaints.  Patient feels generally terrible, but offers no further specific complaints today.  REVIEW OF SYSTEMS:   Review of Systems  Constitutional:  Positive for malaise/fatigue and weight loss. Negative for fever.  Respiratory:  Positive for cough and sputum production. Negative for hemoptysis, shortness of breath and wheezing.   Cardiovascular: Negative.  Negative for chest pain and leg swelling.  Gastrointestinal:  Negative for abdominal pain, diarrhea and nausea.  Genitourinary: Negative.  Negative for dysuria.  Musculoskeletal: Negative.  Negative for back pain.  Skin: Negative.  Negative for rash.  Neurological:  Positive for weakness. Negative for dizziness, focal weakness and headaches.  Psychiatric/Behavioral: Negative.  The patient is not nervous/anxious.    As per HPI. Otherwise, a complete review of systems is negative.  PAST MEDICAL HISTORY: Past Medical History:  Diagnosis Date    Arthritis    Breast cancer ([place]) 2011   radiation- Left   CHF (congestive heart failure) ([facility])    Coronary artery disease    Dyspnea    Hyperlipidemia    Hypertension    Myocardial infarction [facility])    Personal history of radiation therapy    PVD (peripheral vascular disease) ([place])     PAST SURGICAL HISTORY: Past Surgical History:  Procedure Laterality Date   ABDOMINAL HYSTERECTOMY     APPENDECTOMY     BREAST BIOPSY Left 12/21/2009   positive, radiation dcis   BREAST EXCISIONAL BIOPSY Left 01/17/2010   lumpectomy DCIS   BREAST LUMPECTOMY     CATARACT EXTRACTION W/ INTRAOCULAR LENS IMPLANT & ANTERIOR VITRECTOMY, BILATERAL     CHOLECYSTECTOMY     COLONOSCOPY WITH PROPOFOL N/A 09/25/2017   Procedure: COLONOSCOPY WITH PROPOFOL;  Surgeon: Lollie Sails, MD;  Location: [facility] ENDOSCOPY;  Service: Endoscopy;  Laterality: N/A;   CORONARY ARTERY BYPASS GRAFT N/A 05/03/2020   Procedure: CORONARY ARTERY BYPASS GRAFTING (CABG) X FOUR ON PUMP USING LEFT INTERNAL MAMMARY ARTERY AND RIGHT ENDOSCOPIC SAPHEOUS VEIN HARVEST CONDUITS;  Surgeon: Melrose Nakayama, MD;  Location: [place];  Service: Open Heart Surgery;  Laterality: N/A;   DILATION AND CURETTAGE OF UTERUS     EYE SURGERY     bilateral cataracts   INTERCOSTAL NERVE BLOCK Left 11/13/2020   Procedure: INTERCOSTAL NERVE BLOCK;  Surgeon: Melrose Nakayama, MD;  Location: [place];  Service: Thoracic;  Laterality: Left;   IR IMAGING GUIDED PORT INSERTION  01/12/2021   JOINT REPLACEMENT Right 01/24/2016   KNEE ARTHROPLASTY Right 01/24/2016   Procedure: COMPUTER ASSISTED TOTAL KNEE ARTHROPLASTY;  Surgeon: Dereck Leep,  MD;  Location: [facility] ORS;  Service: Orthopedics;  Laterality: Right;   LEFT HEART CATH AND CORONARY ANGIOGRAPHY N/A 05/01/2020   Procedure: LEFT HEART CATH AND CORONARY ANGIOGRAPHY;  Surgeon: Wellington Hampshire, MD;  Location: [place] CV LAB;  Service: Cardiovascular;  Laterality: N/A;   LUNG LOBECTOMY  10/2020    LYMPH NODE DISSECTION Left 11/13/2020   Procedure: LYMPH NODE DISSECTION;  Surgeon: Melrose Nakayama, MD;  Location[facility];  Service: Thoracic;  Laterality: Left;   RIGHT/LEFT HEART CATH AND CORONARY/GRAFT ANGIOGRAPHY N/A 07/27/2020   Procedure: RIGHT/LEFT HEART CATH AND CORONARY/GRAFT ANGIOGRAPHY;  Surgeon: Wellington Hampshire, MD;  Location: [place] CV LAB;  Service: Cardiovascular;  Laterality: N/A;   stent in Lt leg Left    TEE WITHOUT CARDIOVERSION N/A 05/03/2020   Procedure: TRANSESOPHAGEAL ECHOCARDIOGRAM (TEE);  Surgeon: Melrose Nakayama, MD;  Location: [place];  Service: Open Heart Surgery;  Laterality: N/A;   VIDEO BRONCHOSCOPY WITH ENDOBRONCHIAL NAVIGATION N/A 09/25/2020   Procedure: VIDEO BRONCHOSCOPY WITH ENDOBRONCHIAL NAVIGATION;  Surgeon: Melrose Nakayama, MD;  Location: [facility] OR;  Service: Thoracic;  Laterality: N/A;    FAMILY HISTORY: Family History  Problem Relation Age of Onset   Hypertension Mother    Arthritis Mother    Gout Mother    Lung cancer Father    Heart attack Sister     ADVANCED DIRECTIVES (Y/N):  N  HEALTH MAINTENANCE: Social History   Tobacco Use   Smoking status: Former   Smokeless tobacco: Never  Scientific laboratory technician Use: Never used  Substance Use Topics   Alcohol use: No    Alcohol/week: 0.0 standard drinks   Drug use: No     Colonoscopy:  PAP:  Bone density:  Lipid panel:  Allergies  Allergen Reactions   Ibuprofen Hives   Penicillins Hives    Tolerated Ancef in the past   Aleve [Naproxen Sodium] Hives   Avapro  [Irbesartan] Rash   Azithromycin Hives   Nickel Itching    Redness, itching and fluid discharge with nickel earrings    Current Outpatient Medications  Medication Sig Dispense Refill   acetaminophen (TYLENOL) 500 MG tablet Take 1,000 mg by mouth every 6 (six) hours as needed for moderate pain or headache.     aspirin EC 81 MG tablet Take 81 mg by mouth daily.     atorvastatin (LIPITOR) 20 MG tablet Take  1 tablet (20 mg total) by mouth at bedtime. 90 tablet 3   Calcium Carbonate-Vit D-Min (CALCIUM 600+D3 PLUS MINERALS) 600-800 MG-UNIT TABS Take 1 tablet by mouth daily.     chlorpheniramine-HYDROcodone (TUSSIONEX) 10-8 MG/5ML SUER Take 5 mLs by mouth every 12 (twelve) hours as needed for cough.     clopidogrel (PLAVIX) 75 MG tablet Take 1 tablet (75 mg total) by mouth daily. 90 tablet 3   dexamethasone (DECADRON) 4 MG tablet Take 1 tablet (4 mg total) by mouth daily. 30 tablet 2   diphenhydrAMINE-zinc acetate (BENADRYL) cream Apply 1 application topically daily as needed for itching.     diphenoxylate-atropine (LOMOTIL) 2.5-0.025 MG tablet Take 2 tablets by mouth 4 (four) times daily as needed for diarrhea or loose stools. 24 tablet 0   doxycycline (VIBRA-TABS) 100 MG tablet Take by mouth.     furosemide (LASIX) 40 MG tablet Take 1 tablet (40 mg total) by mouth daily. 30 tablet 0   gabapentin (NEURONTIN) 100 MG capsule Take 1 capsule (100 mg total) by mouth 3 (three) times daily.  90 capsule 3   lidocaine-prilocaine (EMLA) cream Apply 1 application topically as needed. Apply to port and cover with saran wrap 1-2 hours prior to port access 30 g 1   Magnesium 500 MG TABS Take 1 tablet by mouth daily.     metoprolol succinate (TOPROL-XL) 50 MG 24 hr tablet Take 1 tablet (50 mg total) by mouth daily. Take with or immediately following a meal. 90 tablet 0   Multiple Vitamin (MULTIVITAMIN WITH MINERALS) TABS tablet Take 1 tablet by mouth daily.     nitroGLYCERIN (NITROSTAT) 0.4 MG SL tablet Place 1 tablet (0.4 mg total) under the tongue every 5 (five) minutes as needed for up to 10 days for chest pain. 10 tablet 0   ondansetron (ZOFRAN) 8 MG tablet Take 1 tablet (8 mg total) by mouth 2 (two) times daily as needed (Nausea or vomiting). 60 tablet 1   oxyCODONE (OXY IR/ROXICODONE) 5 MG immediate release tablet Take 1 tablet (5 mg total) by mouth 3 (three) times daily as needed for severe pain. 15 tablet 0    predniSONE (DELTASONE) 10 MG tablet Take by mouth.     sacubitril-valsartan (ENTRESTO) 24-26 MG Take 1 tablet by mouth 2 (two) times daily. 60 tablet 0   sacubitril-valsartan (ENTRESTO) 49-51 MG Take 1 tablet by mouth 2 (two) times daily. 180 tablet 3   potassium chloride (KLOR-CON M) 10 MEQ tablet Take 2 tablets (20 mEq total) by mouth 2 (two) times daily. 120 tablet 2   No current facility-administered medications for this visit.   Facility-Administered Medications Ordered in Other Visits  Medication Dose Route Frequency Provider Last Rate Last Admin   heparin lock flush 100 UNIT/ML injection             OBJECTIVE: Vitals:   03/14/21 0855  BP: 117/66  Pulse: (!) 127  Resp: 18  Temp: (!) 96.4 F (35.8 C)  SpO2: 97%       Body mass index is 18.63 kg/m.    ECOG FS:0 - Asymptomatic  General: Thin, no acute distress.  Sitting in a wheelchair. Eyes: Pink conjunctiva, anicteric sclera. HEENT: Normocephalic, moist mucous membranes. Lungs: No audible wheezing or coughing. Heart: Regular rate and rhythm. Abdomen: Soft, nontender, no obvious distention. Musculoskeletal: No edema, cyanosis, or clubbing. Neuro: Alert, answering all questions appropriately. Cranial nerves grossly intact. Skin: No rashes or petechiae noted. Psych: Normal affect.   LAB RESULTS:  Lab Results  Component Value Date   NA 126 (L) 03/14/2021   K 4.6 03/14/2021   CL 98 03/14/2021   CO2 21 (L) 03/14/2021   GLUCOSE 130 (H) 03/14/2021   BUN 30 (H) 03/14/2021   CREATININE 1.07 (H) 03/14/2021   CALCIUM 8.6 (L) 03/14/2021   PROT 6.8 03/14/2021   ALBUMIN 2.6 (L) 03/14/2021   AST 21 03/14/2021   ALT 16 03/14/2021   ALKPHOS 96 03/14/2021   BILITOT 0.5 03/14/2021   GFRNONAA 53 (L) 03/14/2021   GFRAA >60 01/26/2016    Lab Results  Component Value Date   WBC 26.6 (H) 03/14/2021   NEUTROABS 23.8 (H) 03/14/2021   HGB 9.3 (L) 03/14/2021   HCT 28.3 (L) 03/14/2021   MCV 95.9 03/14/2021   PLT 371  03/14/2021     STUDIES: DG Chest 2 View  Result Date: 02/28/2021 CLINICAL DATA:  Lung cancer, primary squamous cell carcinoma of the left lung. EXAM: CHEST - 2 VIEW COMPARISON:  Chest radiograph dated January 02, 2021 FINDINGS: The heart is normal in size. Atherosclerotic  calcification of aortic arch. Evidence of prior coronary artery bypass grafting. Right IJ access Port-A-Cath. Right lung is clear. Postsurgical changes in the left lung, unchanged. Left basilar opacity with small left pleural effusion, unchanged. IMPRESSION: 1.  Interval placement of right IJ access Port-A-Cath. 2. Left basilar opacity with small left pleural effusion, unchanged. Electronically Signed   By: Keane Police D.O.   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  MRI of the brain on October 19, 2020 was negative for disease.  Given her stage, patient will benefit from adjuvant chemotherapy using cisplatin and Taxotere every 3 weeks for 4 cycles with Udenyca support.  Patient has had port placement.  Patient's performance status continues to be decreased and she is symptomatic, therefore we will once again hold cycle 3 of cisplatin and Taxotere.  It is unclear if patient can tolerate any additional treatment at this time.  Proceed with IV fluids and 4 g of IV magnesium today.  Return to clinic in 1 week for repeat laboratory work and further evaluation.   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  Continue yearly screening mammograms. Cardiac disease: Continue follow-up with cardiology as scheduled. Anemia: Chronic and unchanged.  Patient's hemoglobin is 9.3.   Hypokalemia: Resolved.  Continue oral potassium supplementation.     Hypomagnesia: Improving.   Continue oral supplementation.  Proceed with 4 g IV magnesium as above.   Renal insufficiency: Creatinine slightly worse today, IV fluids as above. Nausea: Continue current antiemetics. Leukocytosis: White blood cell trending up.  Will get a chest x-ray today.  Patient remains on steroids. Hyponatremia: Sodium levels have trended down slightly to 126.  IV fluids as above. Diarrhea.  Resolved.   Poor appetite/weight loss: Worse this past week.  Continue Decadron and patient was given a referral to dietary. Cough/wheezing: Patient recently finished a course of doxycycline. Repeat chest x-ray as above.   Patient expressed understanding and was in agreement with this plan. She also understands that She can call clinic at any time with any questions, concerns, or complaints.    Cancer Staging  Primary squamous cell carcinoma of lower lobe of left lung [facility]) Staging form: Lung, AJCC 8th Edition - Pathologic stage from 12/21/2020: Stage IIIB (ypT3, pN2, cM0) - Signed by Lloyd Huger, MD on 12/21/2020 Stage prefix: Post-therapy   Lloyd Huger, MD   03/14/2021 12:52 PM

## 2021-03-12 ENCOUNTER — Telehealth: Payer: Self-pay | Admitting: *Deleted

## 2021-03-12 NOTE — Telephone Encounter (Signed)
Received email from pt's granddaughter, [name]. Please see below and advise:  "I'm reaching out about Mrs. [name] to get some guidance or directions.  With her being under the weather from this cough, we've had to postpone chemo round #3 twice now with the hopes that we would reattempt this Wednesday (3/1).    As of this morning, she's still coughing pretty bad and her eating is minimal at best and that's with her being on 2 steroids, the 1 Dr. Grayland Ormond prescribed (Decadron) and then the 1 Dr. Loney Hering prescribed (Prednisone).  She's still fairly weak and fatigued with no signs of improvement in my opinion since last week. She is very aware that she needs to eat, and is trying, but still minimal.  This morning she did mention that when she eats she feels nauseous.  I'm going to try and have her take the nausea medication today and then try eating again, but if that doesn't work, we're kind of at a loss on what else to do or try.    Do you think we should follow up with Dr. Loney Hering tomorrow  since the coughing hasn't improved?  And with knowing all of this, does Dr. Grayland Ormond still want to see her on Wednesday, get labs done etc?    Kind regards,   Kaitlin"

## 2021-03-12 NOTE — Telephone Encounter (Signed)
Pt's granddaughter, Fara Chute, made aware.

## 2021-03-14 ENCOUNTER — Inpatient Hospital Stay (HOSPITAL_BASED_OUTPATIENT_CLINIC_OR_DEPARTMENT_OTHER): Payer: Medicare Other | Admitting: Oncology

## 2021-03-14 ENCOUNTER — Ambulatory Visit
Admission: RE | Admit: 2021-03-14 | Discharge: 2021-03-14 | Disposition: A | Discharge status: Home / Self Care | Payer: Medicare Other | Attending: Oncology | Admitting: Oncology

## 2021-03-14 ENCOUNTER — Inpatient Hospital Stay: Payer: Medicare Other

## 2021-03-14 ENCOUNTER — Other Ambulatory Visit: Payer: Self-pay

## 2021-03-14 ENCOUNTER — Inpatient Hospital Stay: Payer: Medicare Other | Attending: Oncology

## 2021-03-14 ENCOUNTER — Ambulatory Visit
Admission: RE | Admit: 2021-03-14 | Discharge: 2021-03-14 | Disposition: A | Discharge status: Home / Self Care | Payer: Medicare Other | Source: Ambulatory Visit | Attending: Oncology | Admitting: Oncology

## 2021-03-14 VITALS — BP 117/66 | HR 127 | Temp 96.4°F | Resp 18 | Ht 60.0 in | Wt 95.4 lb

## 2021-03-14 DIAGNOSIS — I252 Old myocardial infarction: Secondary | ICD-10-CM | POA: Insufficient documentation

## 2021-03-14 DIAGNOSIS — R0602 Shortness of breath: Secondary | ICD-10-CM | POA: Insufficient documentation

## 2021-03-14 DIAGNOSIS — R059 Cough, unspecified: Secondary | ICD-10-CM | POA: Insufficient documentation

## 2021-03-14 DIAGNOSIS — C3432 Malignant neoplasm of lower lobe, left bronchus or lung: Secondary | ICD-10-CM

## 2021-03-14 DIAGNOSIS — D649 Anemia, unspecified: Secondary | ICD-10-CM | POA: Diagnosis not present

## 2021-03-14 DIAGNOSIS — I10 Essential (primary) hypertension: Secondary | ICD-10-CM | POA: Diagnosis not present

## 2021-03-14 DIAGNOSIS — E785 Hyperlipidemia, unspecified: Secondary | ICD-10-CM | POA: Diagnosis not present

## 2021-03-14 DIAGNOSIS — Z79899 Other long term (current) drug therapy: Secondary | ICD-10-CM | POA: Insufficient documentation

## 2021-03-14 DIAGNOSIS — D72829 Elevated white blood cell count, unspecified: Secondary | ICD-10-CM | POA: Diagnosis not present

## 2021-03-14 DIAGNOSIS — Z801 Family history of malignant neoplasm of trachea, bronchus and lung: Secondary | ICD-10-CM | POA: Insufficient documentation

## 2021-03-14 DIAGNOSIS — R531 Weakness: Secondary | ICD-10-CM | POA: Diagnosis not present

## 2021-03-14 DIAGNOSIS — E871 Hypo-osmolality and hyponatremia: Secondary | ICD-10-CM | POA: Insufficient documentation

## 2021-03-14 DIAGNOSIS — R5383 Other fatigue: Secondary | ICD-10-CM | POA: Insufficient documentation

## 2021-03-14 DIAGNOSIS — Z8249 Family history of ischemic heart disease and other diseases of the circulatory system: Secondary | ICD-10-CM | POA: Diagnosis not present

## 2021-03-14 DIAGNOSIS — R110 Nausea: Secondary | ICD-10-CM | POA: Diagnosis not present

## 2021-03-14 DIAGNOSIS — I2510 Atherosclerotic heart disease of native coronary artery without angina pectoris: Secondary | ICD-10-CM | POA: Insufficient documentation

## 2021-03-14 DIAGNOSIS — Z8349 Family history of other endocrine, nutritional and metabolic diseases: Secondary | ICD-10-CM | POA: Diagnosis not present

## 2021-03-14 DIAGNOSIS — Z8261 Family history of arthritis: Secondary | ICD-10-CM | POA: Diagnosis not present

## 2021-03-14 DIAGNOSIS — Z87891 Personal history of nicotine dependence: Secondary | ICD-10-CM | POA: Diagnosis not present

## 2021-03-14 LAB — COMPREHENSIVE METABOLIC PANEL
ALT: 16 U/L (ref 0–44)
AST: 21 U/L (ref 15–41)
Albumin: 2.6 g/dL — ABNORMAL LOW (ref 3.5–5.0)
Alkaline Phosphatase: 96 U/L (ref 38–126)
Anion gap: 7 (ref 5–15)
BUN: 30 mg/dL — ABNORMAL HIGH (ref 8–23)
CO2: 21 mmol/L — ABNORMAL LOW (ref 22–32)
Calcium: 8.6 mg/dL — ABNORMAL LOW (ref 8.9–10.3)
Chloride: 98 mmol/L (ref 98–111)
Creatinine, Ser: 1.07 mg/dL — ABNORMAL HIGH (ref 0.44–1.00)
GFR, Estimated: 53 mL/min — ABNORMAL LOW (ref >60)
Glucose, Bld: 130 mg/dL — ABNORMAL HIGH (ref 70–99)
Potassium: 4.6 mmol/L (ref 3.5–5.1)
Sodium: 126 mmol/L — ABNORMAL LOW (ref 135–145)
Total Bilirubin: 0.5 mg/dL (ref 0.3–1.2)
Total Protein: 6.8 g/dL (ref 6.5–8.1)

## 2021-03-14 LAB — CBC WITH DIFFERENTIAL/PLATELET
Abs Immature Granulocytes: 0.25 10*3/uL — ABNORMAL HIGH (ref 0.00–0.07)
Basophils Absolute: 0 10*3/uL (ref 0.0–0.1)
Basophils Relative: 0 %
Eosinophils Absolute: 0.1 10*3/uL (ref 0.0–0.5)
Eosinophils Relative: 0 %
HCT: 28.3 % — ABNORMAL LOW (ref 36.0–46.0)
Hemoglobin: 9.3 g/dL — ABNORMAL LOW (ref 12.0–15.0)
Immature Granulocytes: 1 %
Lymphocytes Relative: 5 %
Lymphs Abs: 1.4 10*3/uL (ref 0.7–4.0)
MCH: 31.5 pg (ref 26.0–34.0)
MCHC: 32.9 g/dL (ref 30.0–36.0)
MCV: 95.9 fL (ref 80.0–100.0)
Monocytes Absolute: 1.1 10*3/uL — ABNORMAL HIGH (ref 0.1–1.0)
Monocytes Relative: 4 %
Neutro Abs: 23.8 10*3/uL — ABNORMAL HIGH (ref 1.7–7.7)
Neutrophils Relative %: 90 %
Platelets: 371 10*3/uL (ref 150–400)
RBC: 2.95 MIL/uL — ABNORMAL LOW (ref 3.87–5.11)
RDW: 15.3 % (ref 11.5–15.5)
WBC: 26.6 10*3/uL — ABNORMAL HIGH (ref 4.0–10.5)
nRBC: 0 % (ref 0.0–0.2)

## 2021-03-14 LAB — MAGNESIUM: Magnesium: 1.6 mg/dL — ABNORMAL LOW (ref 1.7–2.4)

## 2021-03-14 MED ORDER — SODIUM CHLORIDE 0.9% FLUSH
10.0000 mL | Freq: Once | INTRAVENOUS | Status: DC | PRN
  Filled 2021-03-14: qty 10

## 2021-03-14 MED ORDER — MAGNESIUM SULFATE 4 GM/100ML IV SOLN
4.0000 g | Freq: Once | INTRAVENOUS | Status: AC
  Administered 2021-03-14: 4 g via INTRAVENOUS
  Filled 2021-03-14: qty 100

## 2021-03-14 MED ORDER — HEPARIN SOD (PORK) LOCK FLUSH 100 UNIT/ML IV SOLN
500.0000 [IU] | Freq: Once | INTRAVENOUS | Status: AC | PRN
  Administered 2021-03-14: 500 [IU]
  Filled 2021-03-14: qty 5

## 2021-03-14 MED ORDER — SODIUM CHLORIDE 0.9 % IV SOLN
Freq: Once | INTRAVENOUS | Status: AC
  Filled 2021-03-14: qty 250

## 2021-03-14 MED ORDER — HEPARIN SOD (PORK) LOCK FLUSH 100 UNIT/ML IV SOLN
INTRAVENOUS | Status: AC
  Filled 2021-03-14: qty 5

## 2021-03-14 NOTE — Progress Notes (Signed)
Pt has had approx 8lb decrease in weight since last week, poor appetite. SOB on exertion, little energy and productive cough since last chemo tx.  ?

## 2021-03-15 NOTE — Progress Notes (Signed)
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  INTERVAL HISTORY: Patient returns to clinic today for repeat laboratory for further evaluation.  Patient states that starting last night, she had increased trouble breathing with significant wheezing.  Her oxygen saturations in clinic today were less than 90%.  She is visibly uncomfortable.  She continues to have persistent weakness and fatigue and a poor appetite.  Patient was transferred to the emergency room for further evaluation.    REVIEW OF SYSTEMS:   Review of Systems  Constitutional:  Positive for malaise/fatigue and weight loss. Negative for fever.  Respiratory:  Positive for cough, shortness of breath and wheezing. Negative for hemoptysis.   Cardiovascular: Negative.  Negative for chest pain and leg swelling.  Gastrointestinal:  Negative for abdominal pain, diarrhea and nausea.  Genitourinary: Negative.  Negative for dysuria.  Musculoskeletal: Negative.  Negative for back pain.  Skin: Negative.  Negative for rash.  Neurological:  Positive for weakness. Negative for dizziness, focal weakness and headaches.  Psychiatric/Behavioral: Negative.  The patient is not nervous/anxious.    As per HPI. Otherwise, a complete review of systems is negative.  PAST MEDICAL HISTORY: Past Medical History:  Diagnosis Date   Arthritis    Breast cancer ([place]) 2011   radiation- Left   CHF (congestive heart failure) ([facility])    Coronary artery disease    Dyspnea    Hyperlipidemia    Hypertension    Myocardial infarction  [facility])    Personal history of radiation therapy    PVD (peripheral vascular disease) ([place])     PAST SURGICAL HISTORY: Past Surgical History:  Procedure Laterality Date   ABDOMINAL HYSTERECTOMY     APPENDECTOMY     BREAST BIOPSY Left 12/21/2009   positive, radiation dcis   BREAST EXCISIONAL BIOPSY Left 01/17/2010   lumpectomy DCIS   BREAST LUMPECTOMY     CATARACT EXTRACTION W/ INTRAOCULAR LENS IMPLANT & ANTERIOR VITRECTOMY, BILATERAL     CHOLECYSTECTOMY     COLONOSCOPY WITH PROPOFOL N/A 09/25/2017   Procedure: COLONOSCOPY WITH PROPOFOL;  Surgeon: Lollie Sails, MD;  Location: [facility] ENDOSCOPY;  Service: Endoscopy;  Laterality: N/A;   CORONARY ARTERY BYPASS GRAFT N/A 05/03/2020   Procedure: CORONARY ARTERY BYPASS GRAFTING (CABG) X FOUR ON PUMP USING LEFT INTERNAL MAMMARY ARTERY AND RIGHT ENDOSCOPIC SAPHEOUS VEIN HARVEST CONDUITS;  Surgeon: Melrose Nakayama, MD;  Location: [place];  Service: Open Heart Surgery;  Laterality: N/A;   DILATION AND CURETTAGE OF UTERUS     EYE SURGERY     bilateral cataracts   INTERCOSTAL NERVE BLOCK Left 11/13/2020   Procedure: INTERCOSTAL NERVE BLOCK;  Surgeon: Melrose Nakayama, MD;  Location: [place];  Service: Thoracic;  Laterality: Left;   IR IMAGING GUIDED PORT INSERTION  01/12/2021   JOINT REPLACEMENT Right 01/24/2016   KNEE ARTHROPLASTY Right 01/24/2016   Procedure: COMPUTER ASSISTED TOTAL KNEE ARTHROPLASTY;  Surgeon: Dereck Leep, MD;  Location: [facility] ORS;  Service: Orthopedics;  Laterality: Right;   LEFT HEART CATH AND CORONARY ANGIOGRAPHY N/A 05/01/2020   Procedure: LEFT HEART CATH AND CORONARY ANGIOGRAPHY;  Surgeon: [name]  A, MD;  Location: [place] CV LAB;  Service: Cardiovascular;  Laterality: N/A;   LUNG LOBECTOMY  10/2020   LYMPH NODE DISSECTION Left 11/13/2020   Procedure: LYMPH NODE DISSECTION;  Surgeon: Melrose Nakayama, MD;  Location: [facility] OR;  Service: Thoracic;  Laterality: Left;   RIGHT/LEFT HEART CATH AND  CORONARY/GRAFT ANGIOGRAPHY N/A 07/27/2020   Procedure: RIGHT/LEFT HEART CATH AND CORONARY/GRAFT ANGIOGRAPHY;  Surgeon: Wellington Hampshire, MD;  Location: [place] CV LAB;  Service: Cardiovascular;  Laterality: N/A;   stent in Lt leg Left    TEE WITHOUT CARDIOVERSION N/A 05/03/2020   Procedure: TRANSESOPHAGEAL ECHOCARDIOGRAM (TEE);  Surgeon: Melrose Nakayama, MD;  Location: [place];  Service: Open Heart Surgery;  Laterality: N/A;   VIDEO BRONCHOSCOPY WITH ENDOBRONCHIAL NAVIGATION N/A 09/25/2020   Procedure: VIDEO BRONCHOSCOPY WITH ENDOBRONCHIAL NAVIGATION;  Surgeon: Melrose Nakayama, MD;  Location: [facility] OR;  Service: Thoracic;  Laterality: N/A;    FAMILY HISTORY: Family History  Problem Relation Age of Onset   Hypertension Mother    Arthritis Mother    Gout Mother    Lung cancer Father    Heart attack Sister     ADVANCED DIRECTIVES (Y/N):  N  HEALTH MAINTENANCE: Social History   Tobacco Use   Smoking status: Former   Smokeless tobacco: Never  Scientific laboratory technician Use: Never used  Substance Use Topics   Alcohol use: No    Alcohol/week: 0.0 standard drinks   Drug use: No     Colonoscopy:  PAP:  Bone density:  Lipid panel:  Allergies  Allergen Reactions   Ibuprofen Hives   Penicillins Hives    Tolerated Ancef in the past   Aleve [Naproxen Sodium] Hives   Avapro  [Irbesartan] Rash   Azithromycin Hives   Nickel Itching    Redness, itching and fluid discharge with nickel earrings    No current facility-administered medications for this visit.   Current Outpatient Medications  Medication Sig Dispense Refill   acetaminophen (TYLENOL) 500 MG tablet Take 1,000 mg by mouth every 6 (six) hours as needed for moderate pain or headache.     aspirin EC 81 MG tablet Take 81 mg by mouth daily.     atorvastatin (LIPITOR) 20 MG tablet Take 1 tablet (20 mg total) by mouth at bedtime. 90 tablet 3   Calcium Carbonate-Vit D-Min (CALCIUM 600+D3 PLUS MINERALS) 600-800 MG-UNIT  TABS Take 1 tablet by mouth daily.     chlorpheniramine-HYDROcodone (TUSSIONEX) 10-8 MG/5ML SUER Take 5 mLs by mouth every 12 (twelve) hours as needed for cough.     clopidogrel (PLAVIX) 75 MG tablet Take 1 tablet (75 mg total) by mouth daily. 90 tablet 3   dexamethasone (DECADRON) 4 MG tablet Take 1 tablet (4 mg total) by mouth daily. 30 tablet 2   diphenhydrAMINE-zinc acetate (BENADRYL) cream Apply 1 application topically daily as needed for itching.     diphenoxylate-atropine (LOMOTIL) 2.5-0.025 MG tablet Take 2 tablets by mouth 4 (four) times daily as needed for diarrhea or loose stools. 24 tablet 0   doxycycline (VIBRA-TABS) 100 MG tablet Take by mouth.     furosemide (LASIX) 40 MG tablet Take 1 tablet (40 mg total) by mouth daily. 30 tablet 0   gabapentin (NEURONTIN) 100 MG capsule Take 1 capsule (100 mg total) by mouth 3 (three) times daily. 90 capsule 3   lidocaine-prilocaine (EMLA) cream Apply 1 application topically as needed. Apply to port and cover with saran wrap 1-2 hours prior  to port access 30 g 1   Magnesium 500 MG TABS Take 1 tablet by mouth daily.     metoprolol succinate (TOPROL-XL) 50 MG 24 hr tablet Take 1 tablet (50 mg total) by mouth daily. Take with or immediately following a meal. 90 tablet 0   Multiple Vitamin (MULTIVITAMIN WITH MINERALS) TABS tablet Take 1 tablet by mouth daily.     nitroGLYCERIN (NITROSTAT) 0.4 MG SL tablet Place 1 tablet (0.4 mg total) under the tongue every 5 (five) minutes as needed for up to 10 days for chest pain. 10 tablet 0   ondansetron (ZOFRAN) 8 MG tablet Take 1 tablet (8 mg total) by mouth 2 (two) times daily as needed (Nausea or vomiting). 60 tablet 1   oxyCODONE (OXY IR/ROXICODONE) 5 MG immediate release tablet Take 1 tablet (5 mg total) by mouth 3 (three) times daily as needed for severe pain. 15 tablet 0   potassium chloride (KLOR-CON M) 10 MEQ tablet Take 2 tablets (20 mEq total) by mouth 2 (two) times daily. 120 tablet 2   predniSONE  (DELTASONE) 10 MG tablet Take by mouth.     sacubitril-valsartan (ENTRESTO) 24-26 MG Take 1 tablet by mouth 2 (two) times daily. 60 tablet 0   sacubitril-valsartan (ENTRESTO) 49-51 MG Take 1 tablet by mouth 2 (two) times daily. 180 tablet 3   Facility-Administered Medications Ordered in Other Visits  Medication Dose Route Frequency Provider Last Rate Last Admin   heparin lock flush 100 UNIT/ML injection            sodium chloride flush (NS) 0.9 % injection 10 mL  10 mL Intravenous Once Lloyd Huger, MD        OBJECTIVE: Vitals:   03/21/21 0933  BP: (!) 105/58  Pulse: (!) 139  Temp: 97.8 F (36.6 C)  SpO2: 90%       Body mass index is 18.4 kg/m.    ECOG FS:0 - Asymptomatic  General: Thin, moderate respiratory distress. Eyes: Pink conjunctiva, anicteric sclera. HEENT: Normocephalic, moist mucous membranes. Lungs: Significant wheezing bilaterally with minimal air movement. Heart: Regular rate and rhythm. Abdomen: Soft, nontender, no obvious distention. Musculoskeletal: No edema, cyanosis, or clubbing. Neuro: Alert, answering all questions appropriately. Cranial nerves grossly intact. Skin: No rashes or petechiae noted. Psych: Normal affect.    LAB RESULTS:  Lab Results  Component Value Date   NA 126 (L) 03/21/2021   K 3.8 03/21/2021   CL 98 03/21/2021   CO2 20 (L) 03/21/2021   GLUCOSE 130 (H) 03/21/2021   BUN 17 03/21/2021   CREATININE 0.80 03/21/2021   CALCIUM 8.3 (L) 03/21/2021   PROT 7.1 03/21/2021   ALBUMIN 2.6 (L) 03/21/2021   AST 24 03/21/2021   ALT 17 03/21/2021   ALKPHOS 99 03/21/2021   BILITOT 0.3 03/21/2021   GFRNONAA >60 03/21/2021   GFRAA >60 01/26/2016    Lab Results  Component Value Date   WBC 22.0 (H) 03/21/2021   NEUTROABS 19.2 (H) 03/21/2021   HGB 9.4 (L) 03/21/2021   HCT 28.3 (L) 03/21/2021   MCV 95.6 03/21/2021   PLT 373 03/21/2021     STUDIES: DG Chest 2 View  Result Date: 03/15/2021 CLINICAL DATA:  None cancer. EXAM:  CHEST - 2 VIEW COMPARISON:  Chest radiograph dated 02/28/2021. FINDINGS: Right-sided Port-A-Cath with tip in similar position. Small left pleural effusion and left lung base opacity and reticulonodular densities similar to prior radiograph. No pneumothorax. Stable cardiomediastinal silhouette. Atherosclerotic calcification of the aorta. Median sternotomy  wires. No acute osseous pathology. IMPRESSION: Small left pleural effusion and left lung base opacity and reticulonodular densities similar to prior radiograph. Electronically Signed   By: Anner Crete M.D.   On: 03/15/2021 23:42   DG Chest 2 View  Result Date: 02/28/2021 CLINICAL DATA:  Lung cancer, primary squamous cell carcinoma of the left lung. EXAM: CHEST - 2 VIEW COMPARISON:  Chest radiograph dated January 02, 2021 FINDINGS: The heart is normal in size. Atherosclerotic calcification of aortic arch. Evidence of prior coronary artery bypass grafting. Right IJ access Port-A-Cath. Right lung is clear. Postsurgical changes in the left lung, unchanged. Left basilar opacity with small left pleural effusion, unchanged. IMPRESSION: 1.  Interval placement of right IJ access Port-A-Cath. 2. Left basilar opacity with small left pleural effusion, unchanged. Electronically Signed   By: Keane Police D.O.   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  MRI of the brain on October 19, 2020 was negative for disease.  Given her stage, patient will benefit from adjuvant chemotherapy using cisplatin and Taxotere every 3 weeks for 4 cycles with Udenyca support.  Patient has had port placement.  No treatment again today.  It is unclear if patient can tolerate any additional treatment at this time.  Patient was transferred to the  ER given her oxygenation status.  Return to clinic in 1 week for reevaluation and discussion whether additional treatment is possible.   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  Continue yearly screening mammograms. Cardiac disease: Continue follow-up with cardiology as scheduled. Anemia: Chronic and unchanged.  Patient's hemoglobin is 9.4 today.   Hypokalemia: Resolved.  Continue oral potassium supplementation.     Hypomagnesia: Worse today.  Magnesium 1.2.  Patient will likely require magnesium infusion in the emergency room.   Renal insufficiency: Resolved. Nausea: Continue current antiemetics. Leukocytosis: Possibly reactive.  Patient remains on steroids. Hyponatremia: Chronic and unchanged.  Patient's sodium levels of 126 today. Diarrhea.  Resolved.   Poor appetite/weight loss: Worse this past week.  Continue Decadron and patient was given a referral to dietary. Cough/wheezing: Patient was transferred to the ED for further evaluation.  Follow-up as above.   Patient expressed understanding and was in agreement with this plan. She also understands that She can call clinic at any time with any questions, concerns, or complaints.    Cancer Staging  Primary squamous cell carcinoma of lower lobe of left lung [facility]) Staging form: Lung, AJCC 8th Edition - Pathologic stage from 12/21/2020: Stage IIIB (ypT3, pN2, cM0) - Signed by Lloyd Huger, MD on 12/21/2020 Stage prefix: Post-therapy   Lloyd Huger, MD   03/21/2021 10:13 AM

## 2021-03-16 ENCOUNTER — Ambulatory Visit: Payer: Medicare Other

## 2021-03-21 ENCOUNTER — Emergency Department: Payer: Medicare Other

## 2021-03-21 ENCOUNTER — Inpatient Hospital Stay
Admission: EM | Admit: 2021-03-21 | Discharge: 2021-03-28 | DRG: 871 | Disposition: A | Discharge status: Home Health | Payer: Medicare Other | Source: Ambulatory Visit | Attending: Internal Medicine | Admitting: Internal Medicine

## 2021-03-21 ENCOUNTER — Other Ambulatory Visit: Payer: Self-pay

## 2021-03-21 ENCOUNTER — Encounter: Payer: Self-pay | Admitting: Oncology

## 2021-03-21 ENCOUNTER — Inpatient Hospital Stay: Payer: Medicare Other

## 2021-03-21 ENCOUNTER — Inpatient Hospital Stay (HOSPITAL_BASED_OUTPATIENT_CLINIC_OR_DEPARTMENT_OTHER): Payer: Medicare Other | Admitting: Oncology

## 2021-03-21 ENCOUNTER — Telehealth: Payer: Self-pay | Admitting: *Deleted

## 2021-03-21 VITALS — BP 105/58 | HR 139 | Temp 97.8°F | Wt 94.2 lb

## 2021-03-21 DIAGNOSIS — Z881 Allergy status to other antibiotic agents status: Secondary | ICD-10-CM

## 2021-03-21 DIAGNOSIS — C3432 Malignant neoplasm of lower lobe, left bronchus or lung: Secondary | ICD-10-CM

## 2021-03-21 DIAGNOSIS — Z886 Allergy status to analgesic agent status: Secondary | ICD-10-CM

## 2021-03-21 DIAGNOSIS — I25118 Atherosclerotic heart disease of native coronary artery with other forms of angina pectoris: Secondary | ICD-10-CM

## 2021-03-21 DIAGNOSIS — Z515 Encounter for palliative care: Secondary | ICD-10-CM | POA: Diagnosis not present

## 2021-03-21 DIAGNOSIS — J189 Pneumonia, unspecified organism: Secondary | ICD-10-CM | POA: Diagnosis present

## 2021-03-21 DIAGNOSIS — E782 Mixed hyperlipidemia: Secondary | ICD-10-CM | POA: Diagnosis not present

## 2021-03-21 DIAGNOSIS — E860 Dehydration: Secondary | ICD-10-CM | POA: Diagnosis present

## 2021-03-21 DIAGNOSIS — I739 Peripheral vascular disease, unspecified: Secondary | ICD-10-CM | POA: Diagnosis present

## 2021-03-21 DIAGNOSIS — J9621 Acute and chronic respiratory failure with hypoxia: Secondary | ICD-10-CM | POA: Diagnosis present

## 2021-03-21 DIAGNOSIS — E871 Hypo-osmolality and hyponatremia: Secondary | ICD-10-CM | POA: Diagnosis present

## 2021-03-21 DIAGNOSIS — R6520 Severe sepsis without septic shock: Secondary | ICD-10-CM

## 2021-03-21 DIAGNOSIS — Z951 Presence of aortocoronary bypass graft: Secondary | ICD-10-CM | POA: Diagnosis not present

## 2021-03-21 DIAGNOSIS — Z853 Personal history of malignant neoplasm of breast: Secondary | ICD-10-CM | POA: Diagnosis not present

## 2021-03-21 DIAGNOSIS — I252 Old myocardial infarction: Secondary | ICD-10-CM

## 2021-03-21 DIAGNOSIS — J984 Other disorders of lung: Secondary | ICD-10-CM

## 2021-03-21 DIAGNOSIS — D649 Anemia, unspecified: Secondary | ICD-10-CM | POA: Diagnosis not present

## 2021-03-21 DIAGNOSIS — I5022 Chronic systolic (congestive) heart failure: Secondary | ICD-10-CM | POA: Diagnosis not present

## 2021-03-21 DIAGNOSIS — I5023 Acute on chronic systolic (congestive) heart failure: Secondary | ICD-10-CM | POA: Diagnosis not present

## 2021-03-21 DIAGNOSIS — Z880 Allergy status to penicillin: Secondary | ICD-10-CM

## 2021-03-21 DIAGNOSIS — Z923 Personal history of irradiation: Secondary | ICD-10-CM | POA: Diagnosis not present

## 2021-03-21 DIAGNOSIS — L89152 Pressure ulcer of sacral region, stage 2: Secondary | ICD-10-CM | POA: Diagnosis present

## 2021-03-21 DIAGNOSIS — I2510 Atherosclerotic heart disease of native coronary artery without angina pectoris: Secondary | ICD-10-CM | POA: Diagnosis present

## 2021-03-21 DIAGNOSIS — E46 Unspecified protein-calorie malnutrition: Secondary | ICD-10-CM

## 2021-03-21 DIAGNOSIS — Z96651 Presence of right artificial knee joint: Secondary | ICD-10-CM | POA: Diagnosis present

## 2021-03-21 DIAGNOSIS — E43 Unspecified severe protein-calorie malnutrition: Secondary | ICD-10-CM | POA: Diagnosis present

## 2021-03-21 DIAGNOSIS — I959 Hypotension, unspecified: Secondary | ICD-10-CM | POA: Diagnosis present

## 2021-03-21 DIAGNOSIS — Z888 Allergy status to other drugs, medicaments and biological substances status: Secondary | ICD-10-CM

## 2021-03-21 DIAGNOSIS — Z20822 Contact with and (suspected) exposure to covid-19: Secondary | ICD-10-CM | POA: Diagnosis present

## 2021-03-21 DIAGNOSIS — A419 Sepsis, unspecified organism: Principal | ICD-10-CM | POA: Diagnosis present

## 2021-03-21 DIAGNOSIS — R000 Tachycardia, unspecified: Secondary | ICD-10-CM | POA: Insufficient documentation

## 2021-03-21 DIAGNOSIS — I110 Hypertensive heart disease with heart failure: Secondary | ICD-10-CM | POA: Diagnosis present

## 2021-03-21 DIAGNOSIS — Z902 Acquired absence of lung [part of]: Secondary | ICD-10-CM | POA: Diagnosis not present

## 2021-03-21 DIAGNOSIS — J851 Abscess of lung with pneumonia: Secondary | ICD-10-CM | POA: Diagnosis present

## 2021-03-21 DIAGNOSIS — E440 Moderate protein-calorie malnutrition: Secondary | ICD-10-CM

## 2021-03-21 DIAGNOSIS — I5020 Unspecified systolic (congestive) heart failure: Secondary | ICD-10-CM

## 2021-03-21 DIAGNOSIS — R627 Adult failure to thrive: Secondary | ICD-10-CM | POA: Diagnosis present

## 2021-03-21 DIAGNOSIS — Z681 Body mass index (BMI) 19 or less, adult: Secondary | ICD-10-CM | POA: Diagnosis not present

## 2021-03-21 DIAGNOSIS — Z8261 Family history of arthritis: Secondary | ICD-10-CM

## 2021-03-21 DIAGNOSIS — L8990 Pressure ulcer of unspecified site, unspecified stage: Secondary | ICD-10-CM | POA: Diagnosis present

## 2021-03-21 DIAGNOSIS — A400 Sepsis due to streptococcus, group A: Secondary | ICD-10-CM | POA: Diagnosis not present

## 2021-03-21 DIAGNOSIS — I447 Left bundle-branch block, unspecified: Secondary | ICD-10-CM | POA: Diagnosis present

## 2021-03-21 DIAGNOSIS — Z8249 Family history of ischemic heart disease and other diseases of the circulatory system: Secondary | ICD-10-CM

## 2021-03-21 DIAGNOSIS — I10 Essential (primary) hypertension: Secondary | ICD-10-CM | POA: Diagnosis present

## 2021-03-21 DIAGNOSIS — J9601 Acute respiratory failure with hypoxia: Secondary | ICD-10-CM | POA: Diagnosis present

## 2021-03-21 DIAGNOSIS — E785 Hyperlipidemia, unspecified: Secondary | ICD-10-CM | POA: Diagnosis present

## 2021-03-21 DIAGNOSIS — Z801 Family history of malignant neoplasm of trachea, bronchus and lung: Secondary | ICD-10-CM

## 2021-03-21 DIAGNOSIS — L89004 Pressure ulcer of unspecified elbow, stage 4: Secondary | ICD-10-CM | POA: Diagnosis not present

## 2021-03-21 LAB — COMPREHENSIVE METABOLIC PANEL
ALT: 17 U/L (ref 0–44)
ALT: 14 U/L (ref 0–44)
AST: 24 U/L (ref 15–41)
AST: 20 U/L (ref 15–41)
Albumin: 2.6 g/dL — ABNORMAL LOW (ref 3.5–5.0)
Albumin: 2.3 g/dL — ABNORMAL LOW (ref 3.5–5.0)
Alkaline Phosphatase: 99 U/L (ref 38–126)
Alkaline Phosphatase: 86 U/L (ref 38–126)
Anion gap: 8 (ref 5–15)
Anion gap: 6 (ref 5–15)
BUN: 17 mg/dL (ref 8–23)
BUN: 16 mg/dL (ref 8–23)
CO2: 20 mmol/L — ABNORMAL LOW (ref 22–32)
CO2: 21 mmol/L — ABNORMAL LOW (ref 22–32)
Calcium: 8.3 mg/dL — ABNORMAL LOW (ref 8.9–10.3)
Calcium: 7.8 mg/dL — ABNORMAL LOW (ref 8.9–10.3)
Chloride: 98 mmol/L (ref 98–111)
Chloride: 104 mmol/L (ref 98–111)
Creatinine, Ser: 0.8 mg/dL (ref 0.44–1.00)
Creatinine, Ser: 0.73 mg/dL (ref 0.44–1.00)
GFR, Estimated: >60 mL/min (ref >60)
GFR, Estimated: >60 mL/min (ref >60)
Glucose, Bld: 130 mg/dL — ABNORMAL HIGH (ref 70–99)
Glucose, Bld: 110 mg/dL — ABNORMAL HIGH (ref 70–99)
Potassium: 3.8 mmol/L (ref 3.5–5.1)
Potassium: 4 mmol/L (ref 3.5–5.1)
Sodium: 126 mmol/L — ABNORMAL LOW (ref 135–145)
Sodium: 131 mmol/L — ABNORMAL LOW (ref 135–145)
Total Bilirubin: 0.3 mg/dL (ref 0.3–1.2)
Total Bilirubin: 0.6 mg/dL (ref 0.3–1.2)
Total Protein: 7.1 g/dL (ref 6.5–8.1)
Total Protein: 6.3 g/dL — ABNORMAL LOW (ref 6.5–8.1)

## 2021-03-21 LAB — PROTIME-INR
INR: 1.1 (ref 0.8–1.2)
Prothrombin Time: 14.7 seconds (ref 11.4–15.2)

## 2021-03-21 LAB — CBC WITH DIFFERENTIAL/PLATELET
Abs Immature Granulocytes: 0.18 10*3/uL — ABNORMAL HIGH (ref 0.00–0.07)
Abs Immature Granulocytes: 0.1 10*3/uL — ABNORMAL HIGH (ref 0.00–0.07)
Basophils Absolute: 0 10*3/uL (ref 0.0–0.1)
Basophils Absolute: 0 10*3/uL (ref 0.0–0.1)
Basophils Relative: 0 %
Basophils Relative: 0 %
Eosinophils Absolute: 0.1 10*3/uL (ref 0.0–0.5)
Eosinophils Absolute: 0 10*3/uL (ref 0.0–0.5)
Eosinophils Relative: 1 %
Eosinophils Relative: 0 %
HCT: 28.3 % — ABNORMAL LOW (ref 36.0–46.0)
HCT: 24.3 % — ABNORMAL LOW (ref 36.0–46.0)
Hemoglobin: 9.4 g/dL — ABNORMAL LOW (ref 12.0–15.0)
Hemoglobin: 8 g/dL — ABNORMAL LOW (ref 12.0–15.0)
Immature Granulocytes: 1 %
Immature Granulocytes: 1 %
Lymphocytes Relative: 7 %
Lymphocytes Relative: 5 %
Lymphs Abs: 1.5 10*3/uL (ref 0.7–4.0)
Lymphs Abs: 0.8 10*3/uL (ref 0.7–4.0)
MCH: 31.8 pg (ref 26.0–34.0)
MCH: 31.7 pg (ref 26.0–34.0)
MCHC: 33.2 g/dL (ref 30.0–36.0)
MCHC: 32.9 g/dL (ref 30.0–36.0)
MCV: 95.6 fL (ref 80.0–100.0)
MCV: 96.4 fL (ref 80.0–100.0)
Monocytes Absolute: 1 10*3/uL (ref 0.1–1.0)
Monocytes Absolute: 0.6 10*3/uL (ref 0.1–1.0)
Monocytes Relative: 5 %
Monocytes Relative: 4 %
Neutro Abs: 19.2 10*3/uL — ABNORMAL HIGH (ref 1.7–7.7)
Neutro Abs: 13.4 10*3/uL — ABNORMAL HIGH (ref 1.7–7.7)
Neutrophils Relative %: 86 %
Neutrophils Relative %: 90 %
Platelets: 373 10*3/uL (ref 150–400)
Platelets: 239 10*3/uL (ref 150–400)
RBC: 2.96 MIL/uL — ABNORMAL LOW (ref 3.87–5.11)
RBC: 2.52 MIL/uL — ABNORMAL LOW (ref 3.87–5.11)
RDW: 15.5 % (ref 11.5–15.5)
RDW: 15.5 % (ref 11.5–15.5)
WBC: 22 10*3/uL — ABNORMAL HIGH (ref 4.0–10.5)
WBC: 14.9 10*3/uL — ABNORMAL HIGH (ref 4.0–10.5)
nRBC: 0 % (ref 0.0–0.2)
nRBC: 0 % (ref 0.0–0.2)

## 2021-03-21 LAB — MAGNESIUM: Magnesium: 1.2 mg/dL — ABNORMAL LOW (ref 1.7–2.4)

## 2021-03-21 LAB — URINALYSIS, ROUTINE W REFLEX MICROSCOPIC
Bacteria, UA: NONE SEEN
Bilirubin Urine: NEGATIVE
Glucose, UA: NEGATIVE mg/dL
Hgb urine dipstick: NEGATIVE
Ketones, ur: NEGATIVE mg/dL
Nitrite: NEGATIVE
Protein, ur: NEGATIVE mg/dL
Specific Gravity, Urine: 1.036 — ABNORMAL HIGH (ref 1.005–1.030)
pH: 5 (ref 5.0–8.0)

## 2021-03-21 LAB — LACTIC ACID, PLASMA: Lactic Acid, Venous: 1.6 mmol/L (ref 0.5–1.9)

## 2021-03-21 LAB — RESP PANEL BY RT-PCR (FLU A&B, COVID) ARPGX2
Influenza A by PCR: NEGATIVE
Influenza B by PCR: NEGATIVE
SARS Coronavirus 2 by RT PCR: NEGATIVE

## 2021-03-21 LAB — PROCALCITONIN: Procalcitonin: <0.1 ng/mL

## 2021-03-21 MED ORDER — SODIUM CHLORIDE 0.9 % IV SOLN
100.0000 mg | Freq: Two times a day (BID) | INTRAVENOUS | Status: DC
  Administered 2021-03-22: 05:00:00 100 mg via INTRAVENOUS
  Filled 2021-03-21 (×3): qty 100

## 2021-03-21 MED ORDER — METOPROLOL SUCCINATE ER 25 MG PO TB24
12.5000 mg | ORAL_TABLET | Freq: Every day | ORAL | Status: DC
  Administered 2021-03-21 – 2021-03-27 (×7): 12.5 mg via ORAL
  Filled 2021-03-21 (×2): qty 0.5
  Filled 2021-03-21 (×3): qty 1
  Filled 2021-03-21: qty 0.5
  Filled 2021-03-21: qty 1

## 2021-03-21 MED ORDER — LEVOFLOXACIN IN D5W 750 MG/150ML IV SOLN: 750.0000 mg | Freq: Once | INTRAVENOUS | Status: DC

## 2021-03-21 MED ORDER — HYDROCOD POLI-CHLORPHE POLI ER 10-8 MG/5ML PO SUER
5.0000 mL | Freq: Two times a day (BID) | ORAL | Status: DC | PRN
  Administered 2021-03-24 – 2021-03-28 (×4): 5 mL via ORAL
  Filled 2021-03-21 (×4): qty 5

## 2021-03-21 MED ORDER — IPRATROPIUM-ALBUTEROL 0.5-2.5 (3) MG/3ML IN SOLN: 3.0000 mL | RESPIRATORY_TRACT | Status: DC | PRN

## 2021-03-21 MED ORDER — ADULT MULTIVITAMIN W/MINERALS CH
1.0000 | ORAL_TABLET | Freq: Every day | ORAL | Status: DC
  Administered 2021-03-22 – 2021-03-28 (×7): 1 via ORAL
  Filled 2021-03-21 (×7): qty 1

## 2021-03-21 MED ORDER — VANCOMYCIN HCL 500 MG/100ML IV SOLN
500.0000 mg | INTRAVENOUS | Status: DC
  Administered 2021-03-22 – 2021-03-26 (×4): 500 mg via INTRAVENOUS
  Filled 2021-03-21 (×6): qty 100

## 2021-03-21 MED ORDER — VANCOMYCIN HCL IN DEXTROSE 1-5 GM/200ML-% IV SOLN
1000.0000 mg | Freq: Once | INTRAVENOUS | Status: AC
  Administered 2021-03-21: 1000 mg via INTRAVENOUS
  Filled 2021-03-21: qty 200

## 2021-03-21 MED ORDER — OYSTER SHELL CALCIUM/D3 500-5 MG-MCG PO TABS
1.0000 | ORAL_TABLET | Freq: Every day | ORAL | Status: DC
  Administered 2021-03-22 – 2021-03-28 (×7): 1 via ORAL
  Filled 2021-03-21 (×7): qty 1

## 2021-03-21 MED ORDER — SODIUM CHLORIDE 0.9% FLUSH
10.0000 mL | Freq: Once | INTRAVENOUS | Status: AC
  Filled 2021-03-21: qty 10

## 2021-03-21 MED ORDER — SODIUM CHLORIDE 0.9 % IV BOLUS
500.0000 mL | Freq: Once | INTRAVENOUS | Status: AC
  Administered 2021-03-21: 500 mL via INTRAVENOUS

## 2021-03-21 MED ORDER — ENOXAPARIN SODIUM 30 MG/0.3ML IJ SOSY
30.0000 mg | PREFILLED_SYRINGE | INTRAMUSCULAR | Status: DC
  Administered 2021-03-21 – 2021-03-27 (×7): 30 mg via SUBCUTANEOUS
  Filled 2021-03-21 (×7): qty 0.3

## 2021-03-21 MED ORDER — IOHEXOL 350 MG/ML SOLN
75.0000 mL | Freq: Once | INTRAVENOUS | Status: AC | PRN
  Administered 2021-03-21: 75 mL via INTRAVENOUS

## 2021-03-21 MED ORDER — DEXAMETHASONE 4 MG PO TABS: 4.0000 mg | ORAL_TABLET | Freq: Every day | ORAL | Status: DC

## 2021-03-21 MED ORDER — OXYCODONE HCL 5 MG PO TABS: 5.0000 mg | ORAL_TABLET | Freq: Three times a day (TID) | ORAL | Status: DC | PRN

## 2021-03-21 MED ORDER — SODIUM CHLORIDE 0.9 % IV SOLN
100.0000 mg | INTRAVENOUS | Status: AC
  Administered 2021-03-21: 100 mg via INTRAVENOUS
  Filled 2021-03-21: qty 100

## 2021-03-21 MED ORDER — MAGNESIUM OXIDE -MG SUPPLEMENT 400 (240 MG) MG PO TABS
400.0000 mg | ORAL_TABLET | Freq: Every day | ORAL | Status: DC
  Administered 2021-03-22 – 2021-03-28 (×7): 400 mg via ORAL
  Filled 2021-03-21 (×7): qty 1

## 2021-03-21 MED ORDER — ATORVASTATIN CALCIUM 20 MG PO TABS
20.0000 mg | ORAL_TABLET | Freq: Every day | ORAL | Status: DC
  Administered 2021-03-21 – 2021-03-28 (×8): 20 mg via ORAL
  Filled 2021-03-21 (×8): qty 1

## 2021-03-21 MED ORDER — SODIUM CHLORIDE 0.9 % IV SOLN
2.0000 g | INTRAVENOUS | Status: DC
  Administered 2021-03-22 – 2021-03-23 (×2): 2 g via INTRAVENOUS
  Filled 2021-03-21: qty 2
  Filled 2021-03-21 (×3): qty 20

## 2021-03-21 MED ORDER — SODIUM CHLORIDE 0.9 % IV SOLN
2.0000 g | INTRAVENOUS | Status: DC
  Administered 2021-03-21: 2 g via INTRAVENOUS
  Filled 2021-03-21: qty 20

## 2021-03-21 NOTE — Assessment & Plan Note (Addendum)
Initially looked slightly dry.  With aggressive fluid resuscitation from sepsis, certainly now is in CHF as confirmed by BNP.  With sepsis stabilized, initially stop IV fluids and started Lasix.  Had to reverse course on 3/12, but now with sepsis restabilized, Lasix restarted and fluids stopped.   ?

## 2021-03-21 NOTE — Assessment & Plan Note (Deleted)
See above re: A&P for CAP ?Tachycardic and soft BP ?Holding most home antihypertensives  ?Reducing dose home metoprolol - she has not taken meds this AM and rebound tachycardia may be concurrent w/ sepsis, will see if HR improves w/ BB but close monitor VS  ?Hx HFrEF, caution w/ IV fluids to avoid precipitation HF exacerbation  ?

## 2021-03-21 NOTE — Assessment & Plan Note (Signed)
See above re: A&P for CAP ?Tachycardic and soft BP ?Holding most home antihypertensives  ?Reducing dose home metoprolol - she has not taken meds this AM and rebound tachycardia may be concurrent w/ sepsis, will see if HR improves w/ BB but close monitor VS  ?Hx HFrEF, caution w/ IV fluids to avoid precipitation HF exacerbation  ?

## 2021-03-21 NOTE — ED Triage Notes (Signed)
Pt brought over from the cancer center with SOB with cough congestion for the pat 2-3 days, 86-88%RA, pt arrives on 2L [place]. Pt is currently getting treatment for lung CA.. pt is a/ox4 on arrival ?

## 2021-03-21 NOTE — Progress Notes (Addendum)
Pharmacy Antibiotic Note ? ?[name] is a 81 y.o. female admitted on 03/21/2021 with cavitary pneumonia/abscess. Pharmacy has been consulted for Vancomycin dosing. ? ?-currently ordered Ceftriaxone, Doxycycline ?-PCN, Azithromycin allergies ?-lung cancer, holding cycle 3 of cisplatin and Taxotere.  It is unclear if patient can tolerate any additional treatment at this time per Oncology note ? ?Plan: ?Vancomycin 1 gram IV x 1 ordered in ED ? ?-will continue with Vancomycin 500 mg IV Q 24h hrs. Goal AUC 400-550. ?Expected AUC: 441 ?SCr used: 0.8 (actual 0.73) ?Cmin 11.7 ?Note TBW (42.7 kg)< IBW (45.5kg)  ? ?-f/u MRSA PCR, cultures, etc ? ?  ? ?Temp (24hrs), Avg:97.8 ?F (36.6 ?C), Min:97.6 ?F (36.4 ?C), Max:98 ?F (36.7 ?C) ? ?Recent Labs  ?Lab 03/21/21 ?3614 03/21/21 ?1032 03/21/21 ?1320  ?WBC 22.0*  --  14.9*  ?CREATININE 0.80  --  0.73  ?LATICACIDVEN  --  1.6  --   ?  ?Estimated Creatinine Clearance: 39.1 mL/min (by C-G formula based on SCr of 0.73 mg/dL).   ? ?Allergies  ?Allergen Reactions  ? Ibuprofen Hives  ? Penicillins Hives  ?  Tolerated Ancef in the past  ? Aleve [Naproxen Sodium] Hives  ? Avapro  [Irbesartan] Rash  ? Azithromycin Hives  ? Nickel Itching  ?  Redness, itching and fluid discharge with nickel earrings  ? ? ?Antimicrobials this admission: ?Ceftriaxone 3/8 >>   ?Doxycycline 3/8 >>   ?Vancomycin 3/8>> ? ?Dose adjustments this admission: ?  ? ?Microbiology results: ?3/8 BCx: pend ?3/8 UCx: pend  ?  Sputum:    ?3/8 MRSA PCR: pending ? ?Thank you for allowing pharmacy to be a part of this patient?s care. ? ?Denetria Luevanos A ?03/21/2021 3:57 PM ? ?

## 2021-03-21 NOTE — Progress Notes (Signed)
PHARMACIST - PHYSICIAN COMMUNICATION ? ?CONCERNING:  Enoxaparin (Lovenox) for DVT Prophylaxis  ? ? ?RECOMMENDATION: ?Patient was prescribed enoxaparin 40mg  q24 hours for VTE prophylaxis.  ? ?There were no vitals filed for this visit. ? ?There is no height or weight on file to calculate BMI. ? ?Estimated Creatinine Clearance: 39.1 mL/min (by C-G formula based on SCr of 0.73 mg/dL). ? ?Patient is candidate for enoxaparin 30mg  every 24 hours based on CrCl <27ml/min or Weight <45kg ? ?DESCRIPTION: ?Pharmacy has adjusted enoxaparin dose per [facility] policy. ? ?Patient is now receiving enoxaparin 30 mg every 24 hours  ? ?[name] ?03/21/2021 ?2:50 PM  ?

## 2021-03-21 NOTE — Assessment & Plan Note (Signed)
Continue statin. 

## 2021-03-21 NOTE — Assessment & Plan Note (Signed)
Improved w/ NS, likely dehydration ?Repeat BMP in AM ?

## 2021-03-21 NOTE — Telephone Encounter (Signed)
Patient's daughter to called to report that they were running late but would be here for appointments. ?

## 2021-03-21 NOTE — H&P (Addendum)
HISTORY AND PHYSICAL  Patient: [name] 81 y.o. female MRN: [id number]  Today is hospital day 0 after admission on 03/21/2021  9:50 AM  RECORD [name]: [name] is a 81 y.o. 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 To ED 03/21/21 from cancer center complaining of shortness of breath cough and congestion x3 days.  Found to be hypoxic over at the cancer center (86-90% on RA) and sent to the ED on nasal cannula.  She does not wear oxygen at home.  She reports shortness of breath constantly, worse laying down.  Denies chest pain.  In ED 03/21/21, tachycardic, tachypneic, SpO2 100% on [place] O2. EKG sinus tach, R axis, LBBB, no ischemia. CXR concerning for PNA. CT Chest neg PE, possible abscess in posterior LUL, multilobar PNA worse in LUL and LLL, enlarging mediastinal and hilar lymph nodes possible reactive vs malignancy. Hyponatremia initially 126 improved to 131. Renal fxn WNL. WBC 22 --> 14.9, Hgb 9.4 --> 8.0. Lactate 1.6, procalcitonin <0.10. Negative influenza and COVID. ED administered ceftriaxone 2 g IV x1, doxycycline 100 mg IV x1, 500 mL bolus NS    Procedures and Significant Results:  CTA Chest 03/21/21 neg PE, possible abscess in posterior LUL, multilobar PNA worse in LUL and LLL, enlarging mediastinal and hilar lymph nodes possible reactive vs malignancy.   Consultants:  none    SUBJECTIVE:  Patient seen and examined resting comfortably in ED, granddaughter at bedside.  Patient confirms the above history.  She states that she has had some increasing shortness of breath/cough, productive cough of yellow sputum, for the past couple of days.  Granddaughter states that is probably been a bit longer than that, patient was resistant to seeking medical attention per the granddaughter.     ASSESSMENT & PLAN  Community acquired pneumonia complicated by underlying  lung cancer, apparent lung abscess on CT Chest  Given abscess, covering with broad spectrum antibiotics w/ MRSA coverage.  No hospitalization w/ IV abx in past 3 mos but given other risk factors especially malignancy, if not improving would have low threshold to involve ID/Pulm   Sepsis due to pneumonia ([place]) See above re: A&P for CAP Tachycardic and soft BP Holding most home antihypertensives  Reducing dose home metoprolol - she has not taken meds this AM (on Toprol XL 50 gm) and rebound tachycardia may be concurrent w/ sepsis, will see if HR improves w/ BB (Rx low dose w/ 12.5 mg Toprol) but close monitor VS  Hx HFrEF, caution w/ IV fluids to avoid precipitation HF exacerbation, toelrated first 500 mL bolus, will order another bolus now  Acute respiratory failure with hypoxia ([place]) Doing well SpO2 100% on 2L [place] Patient is full code - agrees to invasive ventilatory support and CPR if needed   HFrEF (heart failure with reduced ejection fraction) ([place]) Essential hypertension Sinus tachycardia See above re: A&P for CAP No overt fluid overload, has received 500 mg NS bolus in ED, will order another  Tachycardic and soft BP Holding most home antihypertensives  Reducing dose home metoprolol as above Hx HFrEF, caution w/ IV fluids to avoid precipitation HF exacerbation   HLD (hyperlipidemia) PAD (peripheral artery disease) ([place]) Coronary artery disease s/p CABG continue statin, holding other meds as noted except lower dose beta blocker   Primary squamous cell carcinoma of lower lobe of left lung (HCC) Currently Full Code No  advanced directive in place Low threshold for involving palliative care   Protein calorie malnutrition ([place]) Low albumin, chronic illness Low threshold for involving dietary   Hyponatremia Improved w/ NS, likely dehydration Repeat BMP in AM    VTE Ppx: Lovenox CODE STATUS   Code Status: Full Code Admitted from: home Expected Dispo: home likely w/ HH, vs  SNF  Barriers to discharge: severe illness requiring IV antibiotics, continued medical treatment  Family communication: granddaughter at bedside               Past Medical History:  Diagnosis Date   Arthritis    Breast cancer ([place]) 2011   radiation- Left   CHF (congestive heart failure) ([place])    Coronary artery disease    Dyspnea    Hyperlipidemia    Hypertension    Myocardial infarction [facility])    Personal history of radiation therapy    PVD (peripheral vascular disease) ([place])     Past Surgical History:  Procedure Laterality Date   ABDOMINAL HYSTERECTOMY     APPENDECTOMY     BREAST BIOPSY Left 12/21/2009   positive, radiation dcis   BREAST EXCISIONAL BIOPSY Left 01/17/2010   lumpectomy DCIS   BREAST LUMPECTOMY     CATARACT EXTRACTION W/ INTRAOCULAR LENS IMPLANT & ANTERIOR VITRECTOMY, BILATERAL     CHOLECYSTECTOMY     COLONOSCOPY WITH PROPOFOL N/A 09/25/2017   Procedure: COLONOSCOPY WITH PROPOFOL;  Surgeon: Lollie Sails, MD;  Location: [facility] ENDOSCOPY;  Service: Endoscopy;  Laterality: N/A;   CORONARY ARTERY BYPASS GRAFT N/A 05/03/2020   Procedure: CORONARY ARTERY BYPASS GRAFTING (CABG) X FOUR ON PUMP USING LEFT INTERNAL MAMMARY ARTERY AND RIGHT ENDOSCOPIC SAPHEOUS VEIN HARVEST CONDUITS;  Surgeon: Melrose Nakayama, MD;  Location: [place];  Service: Open Heart Surgery;  Laterality: N/A;   DILATION AND CURETTAGE OF UTERUS     EYE SURGERY     bilateral cataracts   INTERCOSTAL NERVE BLOCK Left 11/13/2020   Procedure: INTERCOSTAL NERVE BLOCK;  Surgeon: Melrose Nakayama, MD;  Location: [place];  Service: Thoracic;  Laterality: Left;   IR IMAGING GUIDED PORT INSERTION  01/12/2021   JOINT REPLACEMENT Right 01/24/2016   KNEE ARTHROPLASTY Right 01/24/2016   Procedure: COMPUTER ASSISTED TOTAL KNEE ARTHROPLASTY;  Surgeon: Dereck Leep, MD;  Location: [facility] ORS;  Service: Orthopedics;  Laterality: Right;   LEFT HEART CATH AND CORONARY ANGIOGRAPHY N/A 05/01/2020    Procedure: LEFT HEART CATH AND CORONARY ANGIOGRAPHY;  Surgeon: Wellington Hampshire, MD;  Location: [place] CV LAB;  Service: Cardiovascular;  Laterality: N/A;   LUNG LOBECTOMY  10/2020   LYMPH NODE DISSECTION Left 11/13/2020   Procedure: LYMPH NODE DISSECTION;  Surgeon: Melrose Nakayama, MD;  Location: [place] [facility];  Service: Thoracic;  Laterality: Left;   RIGHT/LEFT HEART CATH AND CORONARY/GRAFT ANGIOGRAPHY N/A 07/27/2020   Procedure: RIGHT/LEFT HEART CATH AND CORONARY/GRAFT ANGIOGRAPHY;  Surgeon: Wellington Hampshire, MD;  Location: [place] [facility];  Service: Cardiovascular;  Laterality: N/A;   stent in Lt leg Left    TEE WITHOUT CARDIOVERSION N/A 05/03/2020   Procedure: TRANSESOPHAGEAL ECHOCARDIOGRAM (TEE);  Surgeon: Melrose Nakayama, MD;  Location: [place];  Service: Open Heart Surgery;  Laterality: N/A;   VIDEO BRONCHOSCOPY WITH ENDOBRONCHIAL NAVIGATION N/A 09/25/2020   Procedure: VIDEO BRONCHOSCOPY WITH ENDOBRONCHIAL NAVIGATION;  Surgeon: Melrose Nakayama, MD;  Location: [facility] OR;  Service: Thoracic;  Laterality: N/A;    Family History  Problem Relation Age of Onset   Hypertension Mother  Arthritis Mother    Gout Mother    Lung cancer Father    Heart attack Sister    Social History:  reports that she has quit smoking. She has never used smokeless tobacco. She reports that she does not drink alcohol and does not use drugs.  Allergies:  Allergies  Allergen Reactions   Ibuprofen Hives   Penicillins Hives    Tolerated Ancef in the past   Aleve [Naproxen Sodium] Hives   Avapro  [Irbesartan] Rash   Azithromycin Hives   Nickel Itching    Redness, itching and fluid discharge with nickel earrings    (Not in a hospital admission)   Results for orders placed or performed during the hospital encounter of 03/21/21 (from the past 48 hour(s))  Protime-INR     Status: None   Collection Time: 03/21/21 10:19 AM  Result Value Ref Range   Prothrombin Time 14.7 11.4 - 15.2  seconds   INR 1.1 0.8 - 1.2    Comment: (NOTE) INR goal varies based on device and disease states. Performed at [facility], [address] [place], [place] [address]   Lactic acid, plasma     Status: None   Collection Time: 03/21/21 10:32 AM  Result Value Ref Range   Lactic Acid, Venous 1.6 0.5 - 1.9 mmol/L    Comment: Performed at [facility], [address], [place], [place] [address]  Resp Panel by RT-PCR (Flu A&B, Covid) Nasopharyngeal Swab     Status: None   Collection Time: 03/21/21 10:32 AM   Specimen: Nasopharyngeal Swab; Nasopharyngeal(NP) swabs in vial transport medium  Result Value Ref Range   SARS Coronavirus 2 by RT PCR NEGATIVE NEGATIVE    Comment: (NOTE) SARS-CoV-2 target nucleic acids are NOT DETECTED.  The SARS-CoV-2 RNA is generally detectable in upper respiratory specimens during the acute phase of infection. The lowest concentration of SARS-CoV-2 viral copies this assay can detect is 138 copies/mL. A negative result does not preclude SARS-Cov-2 infection and should not be used as the sole basis for treatment or other patient management decisions. A negative result may occur with  improper specimen collection/handling, submission of specimen other than nasopharyngeal swab, presence of viral mutation(s) within the areas targeted by this assay, and inadequate number of viral copies(<138 copies/mL). A negative result must be combined with clinical observations, patient history, and epidemiological information. The expected result is Negative.  Fact Sheet for Patients:  EntrepreneurPulse.com.au  Fact Sheet for Healthcare Providers:  IncredibleEmployment.be  This test is no t yet approved or cleared by the [place] FDA and  has been authorized for detection and/or diagnosis of SARS-CoV-2 by FDA under an Emergency Use Authorization (EUA). This EUA will remain  in effect (meaning this test can be  used) for the duration of the COVID-19 declaration under Section 564(b)(1) of the Act, 21 U.S.C.section 360bbb-3(b)(1), unless the authorization is terminated  or revoked sooner.       Influenza A by PCR NEGATIVE NEGATIVE   Influenza B by PCR NEGATIVE NEGATIVE    Comment: (NOTE) The Xpert Xpress SARS-CoV-2/FLU/RSV plus assay is intended as an aid in the diagnosis of influenza from Nasopharyngeal swab specimens and should not be used as a sole basis for treatment. Nasal washings and aspirates are unacceptable for Xpert Xpress SARS-CoV-2/FLU/RSV testing.  Fact Sheet for Patients: EntrepreneurPulse.com.au  Fact Sheet for Healthcare Providers: IncredibleEmployment.be  This test is not yet approved or cleared by the [place] and has been authorized for  detection and/or diagnosis of SARS-CoV-2 by FDA under an Emergency Use Authorization (EUA). This EUA will remain in effect (meaning this test can be used) for the duration of the COVID-19 declaration under Section 564(b)(1) of the Act, 21 U.S.C. section 360bbb-3(b)(1), unless the authorization is terminated or revoked.  Performed at [facility], [address] [place], [place] [address]   Comprehensive metabolic panel     Status: Abnormal   Collection Time: 03/21/21  1:20 PM  Result Value Ref Range   Sodium 131 (L) 135 - 145 mmol/L   Potassium 4.0 3.5 - 5.1 mmol/L   Chloride 104 98 - 111 mmol/L   CO2 21 (L) 22 - 32 mmol/L   Glucose, Bld 110 (H) 70 - 99 mg/dL    Comment: Glucose reference range applies only to samples taken after fasting for at least 8 hours.   BUN 16 8 - 23 mg/dL   Creatinine, Ser 0.73 0.44 - 1.00 mg/dL   Calcium 7.8 (L) 8.9 - 10.3 mg/dL   Total Protein 6.3 (L) 6.5 - 8.1 g/dL   Albumin 2.3 (L) 3.5 - 5.0 g/dL   AST 20 15 - 41 U/L   ALT 14 0 - 44 U/L   Alkaline Phosphatase 86 38 - 126 U/L   Total Bilirubin 0.6 0.3 - 1.2 mg/dL   GFR, Estimated >60 >60  mL/min    Comment: (NOTE) Calculated using the CKD-EPI Creatinine Equation (2021)    Anion gap 6 5 - 15    Comment: Performed at [facility], [address] [place], [place] [address]  CBC WITH DIFFERENTIAL     Status: Abnormal   Collection Time: 03/21/21  1:20 PM  Result Value Ref Range   WBC 14.9 (H) 4.0 - 10.5 K/uL   RBC 2.52 (L) 3.87 - 5.11 MIL/uL   Hemoglobin 8.0 (L) 12.0 - 15.0 g/dL   HCT 24.3 (L) 36.0 - 46.0 %   MCV 96.4 80.0 - 100.0 fL   MCH 31.7 26.0 - 34.0 pg   MCHC 32.9 30.0 - 36.0 g/dL   RDW 15.5 11.5 - 15.5 %   Platelets 239 150 - 400 K/uL   nRBC 0.0 0.0 - 0.2 %   Neutrophils Relative % 90 %   Neutro Abs 13.4 (H) 1.7 - 7.7 K/uL   Lymphocytes Relative 5 %   Lymphs Abs 0.8 0.7 - 4.0 K/uL   Monocytes Relative 4 %   Monocytes Absolute 0.6 0.1 - 1.0 K/uL   Eosinophils Relative 0 %   Eosinophils Absolute 0.0 0.0 - 0.5 K/uL   Basophils Relative 0 %   Basophils Absolute 0.0 0.0 - 0.1 K/uL   Immature Granulocytes 1 %   Abs Immature Granulocytes 0.10 (H) 0.00 - 0.07 K/uL    Comment: Performed at [facility], [address] [place], [place] [address]  Procalcitonin - Baseline     Status: None   Collection Time: 03/21/21  1:20 PM  Result Value Ref Range   Procalcitonin <0.10 ng/mL    Comment:        Interpretation: PCT (Procalcitonin) <= 0.5 ng/mL: Systemic infection (sepsis) is not likely. Local bacterial infection is possible. (NOTE)       Sepsis PCT Algorithm           Lower Respiratory Tract  Infection PCT Algorithm    ----------------------------     ----------------------------         PCT < 0.25 ng/mL                PCT < 0.10 ng/mL          Strongly encourage             Strongly discourage   discontinuation of antibiotics    initiation of antibiotics    ----------------------------     -----------------------------       PCT 0.25 - 0.50 ng/mL            PCT 0.10 - 0.25 ng/mL               OR        >80% decrease in PCT            Discourage initiation of                                            antibiotics      Encourage discontinuation           of antibiotics    ----------------------------     -----------------------------         PCT >= 0.50 ng/mL              PCT 0.26 - 0.50 ng/mL               AND        <80% decrease in PCT             Encourage initiation of                                             antibiotics       Encourage continuation           of antibiotics    ----------------------------     -----------------------------        PCT >= 0.50 ng/mL                  PCT > 0.50 ng/mL               AND         increase in PCT                  Strongly encourage                                      initiation of antibiotics    Strongly encourage escalation           of antibiotics                                     -----------------------------                                           PCT <= 0.25 ng/mL  OR                                        > 80% decrease in PCT                                      Discontinue / Do not initiate                                             antibiotics  Performed at [facility], [address] [place], [place] [address]    CT Angio Chest PE W and/or Wo Contrast  Result Date: 03/21/2021 CLINICAL DATA:  Shortness of breath with cough and congestion for 2-3 days. Low O2 sats. Lung cancer. * onc * EXAM: CT ANGIOGRAPHY CHEST WITH CONTRAST TECHNIQUE: Multidetector CT imaging of the chest was performed using the standard protocol during bolus administration of intravenous contrast. Multiplanar CT image reconstructions and MIPs were obtained to evaluate the vascular anatomy. RADIATION DOSE REDUCTION: This exam was performed according to the departmental dose-optimization program which includes automated exposure control, adjustment of the mA and/or kV according to patient size  and/or use of iterative reconstruction technique. CONTRAST:  56mL OMNIPAQUE IOHEXOL 350 MG/ML SOLN COMPARISON:  11/05/2020. FINDINGS: Cardiovascular: Negative for pulmonary embolus. Right IJ Port-A-Cath traverses a stenotic right internal jugular vein, terminating in the right atrium. Atherosclerotic calcification of the aorta. Heart is enlarged. Left ventricle appears dilated. No pericardial effusion. Mediastinum/Nodes: Mediastinal lymph nodes measure up to 11 mm in the low right paratracheal station, increased from 9 mm on 11/05/2020. Enlarging bihilar lymph nodes, measuring up to 10 mm on the left (4/31), previously 9 mm. Calcified subcarinal lymph node. No axillary adenopathy. Esophagus is grossly unremarkable. Lungs/Pleura: Centrilobular emphysema. New peribronchovascular nodularity and consolidation in the left upper and left lower lobes with minimal involvement of the right middle and right lower lobes. Interval left lower lobectomy. Thick-walled cavitary mass in the posterior left upper lobe measures approximately 4.8 x 6.4 cm and contains an air-fluid level. No right pleural fluid. Airway is otherwise unremarkable. Upper Abdomen: Visualized portions of the liver and adrenal glands are unremarkable. Mild scarring in the kidneys bilaterally. Visualized portions of the kidneys, spleen, pancreas, stomach and bowel are otherwise unremarkable. No upper abdominal adenopathy. Musculoskeletal: Degenerative changes in the spine. Old left rib fracture or thoracotomy. No worrisome lytic or sclerotic lesions. Review of the MIP images confirms the above findings. IMPRESSION: 1. Negative for pulmonary embolus. 2. Interval left lower lobectomy. Thick-walled cavitary mass in the posterior left upper lobe is indicative of an abscess with or without a bronchopleural or alveolar pleural fistula. 3. Multi lobar pneumonia, worst in the left upper and left lower lobes. 4. Enlarging mediastinal and hilar lymph nodes, possibly  reactive. Difficult to exclude malignancy. 5.  Aortic atherosclerosis (ICD10-I70.0). 6.  Emphysema (ICD10-J43.9). Electronically Signed   By: Lorin Picket M.D.   On: 03/21/2021 13:55   DG Chest Portable 1 View  Result Date: 03/21/2021 CLINICAL DATA:  Shortness of breath, cough and congestion, decreased O2 sats. EXAM: PORTABLE CHEST 1 VIEW COMPARISON:  03/14/2021 and CT chest 11/05/2020. FINDINGS: Right IJ power port tip is in the SVC. Heart  size stable. Thoracic aorta is calcified. Postoperative volume loss in the left hemithorax with an elevated left hemidiaphragm. Increasing patchy airspace consolidation in the left lung base with a small left pleural effusion. Right lung is grossly clear. IMPRESSION: 1. Increasing patchy airspace opacification in the lower left hemithorax, worrisome for pneumonia. 2. Small left pleural effusion. Electronically Signed   By: Lorin Picket M.D.   On: 03/21/2021 10:37    Review of Systems  Constitutional:  Positive for chills, fatigue and fever.  HENT:  Negative for trouble swallowing.   Respiratory:  Positive for cough, chest tightness and shortness of breath.   Cardiovascular:  Negative for chest pain, palpitations and leg swelling.  Gastrointestinal:  Negative for abdominal distention and abdominal pain.  Genitourinary:  Negative for difficulty urinating.  Neurological:  Negative for syncope.  Psychiatric/Behavioral:  Negative for confusion.    Blood pressure (!) 91/36, pulse (!) 108, temperature 98 F (36.7 C), temperature source Oral, resp. rate (!) 24, SpO2 100 %. Physical Exam Constitutional:      General: She is not in acute distress.    Appearance: She is well-developed. She is ill-appearing. She is not toxic-appearing.  HENT:     Head: Normocephalic and atraumatic.  Eyes:     Extraocular Movements: Extraocular movements intact.  Cardiovascular:     Rate and Rhythm: Regular rhythm. Tachycardia present.  Pulmonary:     Breath sounds:  Examination of the left-middle field reveals rhonchi. Examination of the right-lower field reveals decreased breath sounds. Examination of the left-lower field reveals decreased breath sounds. Decreased breath sounds and rhonchi present.  Chest:     Chest wall: No tenderness.  Abdominal:     Palpations: Abdomen is soft.  Musculoskeletal:     Cervical back: Neck supple.     Right lower leg: No edema.     Left lower leg: No edema.  Skin:    General: Skin is dry.  Neurological:     General: No focal deficit present.     Mental Status: She is alert and oriented to person, place, and time.     Motor: No weakness.  Psychiatric:        Mood and Affect: Mood normal.        Behavior: Behavior normal.     Emeterio Reeve, DO 03/21/2021, 3:44 PM

## 2021-03-21 NOTE — Assessment & Plan Note (Signed)
continue statin, holding other meds as noted except lower dose beta blocker  ?

## 2021-03-21 NOTE — Assessment & Plan Note (Addendum)
Given abscess, covering with broad spectrum antibiotics w/ MRSA coverage.  Clinically improving.  Infectious disease consulted.  For now, holding off on plans for aspiration or bronchoscopy and antibiotics only.  As above.

## 2021-03-21 NOTE — Progress Notes (Signed)
Wheezing started last night and the SOB. ?Pt is having trouble walking and cannot catch her breath. ? ?Took o2 stats on pt and they are standing at 86-90% with HR 142-150 , BP 105/58. ? ?Pt begun to actively cough and could not catch her breath and Dr. Grayland Ormond, Myself ([place], [place] into the room to assess pt. ? ?Pt placed on o2 per Dr.'s orders and transported to [facility] ED.  ? ?[place] to give verbal report of pt and transfer.  ? ?  ?

## 2021-03-21 NOTE — ED Notes (Signed)
Pt knows need for ua . 

## 2021-03-21 NOTE — Progress Notes (Signed)
Nutrition ? ?Patient not seen today in infusion as planned due to infusion cancelled and patient sent to ED.   ? ?Ceazia Harb B. Zenia Resides, RD, LDN ?Registered Dietitian ?[phone] (mobile) ? ?

## 2021-03-21 NOTE — ED Provider Notes (Signed)
? ?[facility] ?Provider Note ? ? ? Event Date/Time  ? First MD Initiated Contact with Patient 03/21/21 [phone]   ?  (approximate) ? ? ?History  ? ?Shortness of Breath ? ? ?HPI ? ?[name] is a 81 y.o. female with a history of lung cancer, hypertension, CAD status post CABG who comes ED complaining of shortness of breath cough and congestion for the past 3 days.  Found to be hypoxic over the cancer center and sent to the ED on nasal cannula.  She does not wear oxygen at home.  She shortness of breath constantly, worse laying down.  Denies chest pain. ?  ? ? ?Physical Exam  ? ?Triage Vital Signs: ?ED Triage Vitals  ?Enc Vitals Group  ?   BP 03/21/21 0959 (!) 125/59  ?   Pulse Rate 03/21/21 0959 (!) 135  ?   Resp 03/21/21 0959 (!) 24  ?   Temp 03/21/21 1005 97.6 ?F (36.4 ?C)  ?   Temp Source 03/21/21 0959 Oral  ?   SpO2 03/21/21 0959 98 %  ?   Weight --   ?   Height --   ?   Head Circumference --   ?   Peak Flow --   ?   Pain Score 03/21/21 1000 0  ?   Pain Loc --   ?   Pain Edu? --   ?   Excl. in [place]? --   ? ? ?Most recent vital signs: ?Vitals:  ? 03/21/21 1230 03/21/21 1300  ?BP: 110/87 (!) 110/59  ?Pulse: 73 (!) 120  ?Resp: 20 17  ?Temp:    ?SpO2: 100% 100%  ? ? ? ?General: Awake, no distress.  Ill-appearing ?CV:  Good peripheral perfusion.  Tachycardia heart rate 130 ?Resp:    Absent breath sounds in the left base.  Tachypnea. ?Abd:  No distention.  Soft, nontender ?Other:  No lower extremity edema or calf tenderness.  No asymmetry.  No rash ? ? ?ED Results / Procedures / Treatments  ? ?Labs ?(all labs ordered are listed, but only abnormal results are displayed) ?Labs Reviewed  ?COMPREHENSIVE METABOLIC PANEL - Abnormal; Notable for the following components:  ?    Result Value  ? Sodium 131 (*)   ? CO2 21 (*)   ? Glucose, Bld 110 (*)   ? Calcium 7.8 (*)   ? Total Protein 6.3 (*)   ? Albumin 2.3 (*)   ? All other components within normal limits  ?CBC WITH DIFFERENTIAL/PLATELET - Abnormal;  Notable for the following components:  ? WBC 14.9 (*)   ? RBC 2.52 (*)   ? Hemoglobin 8.0 (*)   ? HCT 24.3 (*)   ? Neutro Abs 13.4 (*)   ? Abs Immature Granulocytes 0.10 (*)   ? All other components within normal limits  ?RESP PANEL BY RT-PCR (FLU A&B, COVID) [id number]  ?CULTURE, BLOOD (ROUTINE X 2)  ?CULTURE, BLOOD (ROUTINE X 2)  ?URINE CULTURE  ?LACTIC ACID, PLASMA  ?PROTIME-INR  ?PROCALCITONIN  ?URINALYSIS, ROUTINE W REFLEX MICROSCOPIC  ? ? ? ?EKG ? ?Interpreted by me ?Wandering baseline.  Sinus tachycardia rate of 138.  Right axis.  Left bundle branch block.  No acute ischemic changes. ? ? ?RADIOLOGY ?Chest x-ray viewed and interpreted by me, appears to show some left lung opacity concerning for pneumonia.  There is chronic opacity in this area when compared to previous x-rays such as February 28, 2021.  Radiology report reviewed which  suspects acute infiltrate. ? ? ? ?PROCEDURES: ? ?Critical Care performed: Yes, see critical care procedure note(s) ? ?.Critical Care ?Performed by: Carrie Mew, MD ?Authorized by: Carrie Mew, MD  ? ?Critical care provider statement:  ?  Critical care time (minutes):  35 ?  Critical care time was exclusive of:  Separately billable procedures and treating other patients ?  Critical care was necessary to treat or prevent imminent or life-threatening deterioration of the following conditions:  Sepsis, respiratory failure, cardiac failure and circulatory failure ?  Critical care was time spent personally by me on the following activities:  Development of treatment plan with patient or surrogate, discussions with consultants, evaluation of patient's response to treatment, examination of patient, obtaining history from patient or surrogate, ordering and performing treatments and interventions, ordering and review of laboratory studies, ordering and review of radiographic studies, pulse oximetry, re-evaluation of patient's condition and review of old charts ?  Care discussed  with: admitting provider   ?Comments:  ?    ? ? ? ? ?.1-3 Lead EKG Interpretation ?Performed by: Carrie Mew, MD ?Authorized by: Carrie Mew, MD  ? ?  Interpretation: abnormal   ?  ECG rate:  130 ?  ECG rate assessment: tachycardic   ?  Rhythm: sinus tachycardia   ?  Ectopy: none   ? ? ?MEDICATIONS ORDERED IN ED: ?Medications  ?cefTRIAXone (ROCEPHIN) 2 g in sodium chloride 0.9 % 100 mL IVPB (2 g Intravenous New Bag/Given 03/21/21 1411)  ?doxycycline (VIBRAMYCIN) 100 mg in sodium chloride 0.9 % 250 mL IVPB (has no administration in time range)  ?sodium chloride 0.9 % bolus 500 mL (0 mLs Intravenous Stopped 03/21/21 1402)  ?iohexol (OMNIPAQUE) 350 MG/ML injection 75 mL (75 mLs Intravenous Contrast Given 03/21/21 1339)  ? ? ? ?IMPRESSION / MDM / ASSESSMENT AND PLAN / ED COURSE  ?I reviewed the triage vital signs and the nursing notes. ?             ?               ? ?Differential diagnosis includes, but is not limited to, pneumonia, pulmonary edema, pleural effusion, pulmonary embolism, non-STEMI ? ?**The patient is on the cardiac monitor to evaluate for evidence of arrhythmia and/or significant heart rate changes.**} ? ?Patient presents with tachycardia, tachypnea, hypoxia concerning for primary pulmonary disease or possible PE with lung cancer history.  Will obtain labs, chest x-ray, CT angiogram. ? ?Clinical Course as of 03/21/21 1445  ?Wed Mar 21, 2021  ?1315 Delay in obtaining cbc and chem due to lost samples in the lab. Will resend tubes. Lactate is normal. [PS]  ?1321 With chest x-ray finding of possible infiltrate, will give IV antibiotics. [PS]  ?  ?Clinical Course User Index ?[PS] Carrie Mew, MD  ? ? ?----------------------------------------- ?2:46 PM on 03/21/2021 ?----------------------------------------- ?CT negative for PE, confirms multifocal pneumonia and possibly a pulmonary abscess. ? ? ?FINAL CLINICAL IMPRESSION(S) / ED DIAGNOSES  ? ?Final diagnoses:  ?Community acquired pneumonia of  left lung, unspecified part of lung  ?Sepsis with acute hypoxic respiratory failure without septic shock, due to unspecified organism [facility])  ? ? ? ?Rx / DC Orders  ? ?ED Discharge Orders   ? ? None  ? ?  ? ? ? ?Note:  This document was prepared using Dragon voice recognition software and may include unintentional dictation errors. ?  ?Carrie Mew, MD ?03/21/21 1446 ? ?

## 2021-03-21 NOTE — Assessment & Plan Note (Addendum)
Initially only required 2 L, but with fluid resuscitation, also in acute CHF and and was needing 4 L.  Should improve with IV Lasix.  We will work on weaning oxygen.  She is not on any oxygen at home.  Down to 1 L. ?

## 2021-03-21 NOTE — Assessment & Plan Note (Addendum)
Currently Full Code ?No advanced directive in place.  Last chemotherapy 2 weeks ago. ?

## 2021-03-21 NOTE — Assessment & Plan Note (Addendum)
Holding some antihypertensives due to hypotension from sepsis and blood loss.  Pressures improved with blood and fluids. ?

## 2021-03-21 NOTE — Assessment & Plan Note (Addendum)
Nutrition Status: ?Nutrition Problem: Severe Malnutrition ?Etiology: chronic illness (stage 3 lung cancer) ?Signs/Symptoms: moderate muscle depletion, severe muscle depletion, moderate fat depletion, severe fat depletion, energy intake < or equal to 75% for > or equal to 1 month, percent weight loss ?Interventions: Ensure Enlive (each supplement provides 350kcal and 20 grams of protein), MVI, Liberalize Diet ? ? ? ?

## 2021-03-22 ENCOUNTER — Encounter: Payer: Self-pay | Admitting: Osteopathic Medicine

## 2021-03-22 DIAGNOSIS — I5022 Chronic systolic (congestive) heart failure: Secondary | ICD-10-CM | POA: Diagnosis not present

## 2021-03-22 DIAGNOSIS — L89152 Pressure ulcer of sacral region, stage 2: Secondary | ICD-10-CM | POA: Diagnosis not present

## 2021-03-22 DIAGNOSIS — L8990 Pressure ulcer of unspecified site, unspecified stage: Secondary | ICD-10-CM | POA: Diagnosis present

## 2021-03-22 DIAGNOSIS — J189 Pneumonia, unspecified organism: Secondary | ICD-10-CM | POA: Diagnosis not present

## 2021-03-22 DIAGNOSIS — D649 Anemia, unspecified: Secondary | ICD-10-CM | POA: Diagnosis present

## 2021-03-22 DIAGNOSIS — J9601 Acute respiratory failure with hypoxia: Secondary | ICD-10-CM | POA: Diagnosis not present

## 2021-03-22 LAB — CBC
HCT: 21.3 % — ABNORMAL LOW (ref 36.0–46.0)
Hemoglobin: 6.8 g/dL — ABNORMAL LOW (ref 12.0–15.0)
MCH: 31.1 pg (ref 26.0–34.0)
MCHC: 31.9 g/dL (ref 30.0–36.0)
MCV: 97.3 fL (ref 80.0–100.0)
Platelets: 206 10*3/uL (ref 150–400)
RBC: 2.19 MIL/uL — ABNORMAL LOW (ref 3.87–5.11)
RDW: 15.9 % — ABNORMAL HIGH (ref 11.5–15.5)
WBC: 10.9 10*3/uL — ABNORMAL HIGH (ref 4.0–10.5)
nRBC: 0 % (ref 0.0–0.2)

## 2021-03-22 LAB — MRSA NEXT GEN BY PCR, NASAL: MRSA by PCR Next Gen: NOT DETECTED

## 2021-03-22 LAB — GLUCOSE, CAPILLARY
Glucose-Capillary: 63 mg/dL — ABNORMAL LOW (ref 70–99)
Glucose-Capillary: 67 mg/dL — ABNORMAL LOW (ref 70–99)
Glucose-Capillary: 106 mg/dL — ABNORMAL HIGH (ref 70–99)
Glucose-Capillary: 84 mg/dL (ref 70–99)

## 2021-03-22 LAB — BASIC METABOLIC PANEL
Anion gap: 10 (ref 5–15)
BUN: 13 mg/dL (ref 8–23)
CO2: 20 mmol/L — ABNORMAL LOW (ref 22–32)
Calcium: 7.7 mg/dL — ABNORMAL LOW (ref 8.9–10.3)
Chloride: 103 mmol/L (ref 98–111)
Creatinine, Ser: 0.66 mg/dL (ref 0.44–1.00)
GFR, Estimated: >60 mL/min (ref >60)
Glucose, Bld: 106 mg/dL — ABNORMAL HIGH (ref 70–99)
Potassium: 3.3 mmol/L — ABNORMAL LOW (ref 3.5–5.1)
Sodium: 133 mmol/L — ABNORMAL LOW (ref 135–145)

## 2021-03-22 LAB — PREPARE RBC (CROSSMATCH)

## 2021-03-22 LAB — HIV ANTIBODY (ROUTINE TESTING W REFLEX): HIV Screen 4th Generation wRfx: NONREACTIVE

## 2021-03-22 LAB — HEMOGLOBIN AND HEMATOCRIT, BLOOD
HCT: 26 % — ABNORMAL LOW (ref 36.0–46.0)
Hemoglobin: 8.5 g/dL — ABNORMAL LOW (ref 12.0–15.0)

## 2021-03-22 MED ORDER — HEPARIN SOD (PORK) LOCK FLUSH 100 UNIT/ML IV SOLN
INTRAVENOUS | Status: AC
  Filled 2021-03-22: qty 5

## 2021-03-22 MED ORDER — SODIUM CHLORIDE 0.9% IV SOLUTION
Freq: Once | INTRAVENOUS | Status: DC
  Filled 2021-03-22: qty 250

## 2021-03-22 MED ORDER — METRONIDAZOLE 500 MG/100ML IV SOLN
500.0000 mg | Freq: Two times a day (BID) | INTRAVENOUS | Status: DC
Start: 2021-03-22 — End: 2021-03-23
  Administered 2021-03-22 – 2021-03-23 (×2): 500 mg via INTRAVENOUS
  Filled 2021-03-22 (×3): qty 100

## 2021-03-22 MED ORDER — ENSURE ENLIVE PO LIQD
237.0000 mL | Freq: Three times a day (TID) | ORAL | Status: DC
  Administered 2021-03-22 – 2021-03-26 (×7): 237 mL via ORAL

## 2021-03-22 MED ORDER — CHLORHEXIDINE GLUCONATE CLOTH 2 % EX PADS
6.0000 | MEDICATED_PAD | Freq: Every day | CUTANEOUS | Status: DC
  Administered 2021-03-22 – 2021-03-28 (×7): 6 via TOPICAL

## 2021-03-22 MED ORDER — SODIUM CHLORIDE 0.9 % IV SOLN
INTRAVENOUS | Status: DC | PRN
Start: 2021-03-22 — End: 2021-03-25

## 2021-03-22 NOTE — Assessment & Plan Note (Addendum)
Baseline hemoglobin looks to be around 8.5-9.5 and came in with hemoglobin of 8.0, but then dropped to 6.8 within 14 hours.  Etiology unclear.  Does not look like pancytopenia.  Does not appear to be hemoconcentration.  She is 2 weeks out from chemotherapy.  Checking Hemoccult.  Patient received 1 unit packed red blood cells.  Following H&H closely.  Posttransfusion, hemoglobin up to 8.5 and remained stable. ?

## 2021-03-22 NOTE — ED Notes (Signed)
[name] notified of Pt Hgb dropping from 8.0 to 6.9 ?

## 2021-03-22 NOTE — Hospital Course (Addendum)
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  In emergency room, patient found to have sepsis from multilobar pneumonia and possible lung abscess causing acute respiratory failure with hypoxia.  Admitted to the hospitalist service and started on IV fluids and antibiotics.  Infectious disease consulted recommending antibiotic course and hopefully to avoid bronchoscopy/aspiration of abscess. ? ?By 3/10, clinically improving.  BNP elevated at 1100.  IV fluids stopped and patient started on IV Lasix with some good diuresis.  Antibiotics attempted to be simplified on the evening of 3/10 which led to increasing white blood cell count and lactic acid level by 3/12.  On night of 3/12, IV fluids restarted, Lasix stopped and antibiotics changed back to original regimen after discussion with infectious disease.  By 3/13, white blood cell count and lactic acid level had normalized.  Fluids stopped and Lasix restarted. ?

## 2021-03-22 NOTE — Assessment & Plan Note (Signed)
Pressure Injury 03/22/21 Sacrum Mid Stage 2 -  Partial thickness loss of dermis presenting as a shallow open injury with a red, pink wound bed without slough. (Active)  ?03/22/21 1300  ?Location: Sacrum  ?Location Orientation: Mid  ?Staging: Stage 2 -  Partial thickness loss of dermis presenting as a shallow open injury with a red, pink wound bed without slough.  ?Wound Description (Comments):   ?Present on Admission: Yes  ? ?Stage II sacral decubitus, present on admission.  Wound care as directed. ? ? ? ?

## 2021-03-22 NOTE — Consult Note (Signed)
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 female wth hx of CAD s/p CABG 2022, primary squamous cell ca of Left lung s/p lobectomy 2022, who has paused chemo due to poor health status. Unintentional weight loss, worsening shortness of breath. Admitted for FTT, hypoxia. Found to have new cavitary abscess LUL on CT, feels better since admission. Started on vanco/ceftriaxone. Afebrile. Hx of PCN allergy listed but she doesn't recall. Productive cough for several weeks per granddaughter  Past Medical History:  Diagnosis Date   Arthritis    Breast cancer ([place]) 2011   radiation- Left   CHF (congestive heart failure) (HCC)    Coronary artery disease    Dyspnea    Hyperlipidemia    Hypertension    Myocardial infarction [facility])    Personal history of radiation therapy    PVD (peripheral vascular disease) (HCC)     Allergies:  Allergies  Allergen Reactions   Ibuprofen Hives   Penicillins Hives    Tolerated Ancef in the past   Aleve [Naproxen Sodium] Hives   Avapro  [Irbesartan] Rash   Azithromycin Hives   Nickel Itching    Redness, itching and fluid discharge with nickel earrings     MEDICATIONS:  sodium  chloride   Intravenous Once   atorvastatin  20 mg Oral Daily   calcium-vitamin D  1 tablet Oral Daily   Chlorhexidine Gluconate Cloth  6 each Topical Daily   enoxaparin (LOVENOX) injection  30 mg Subcutaneous Q24H   feeding supplement  237 mL Oral TID BM   magnesium oxide  400 mg Oral Daily   metoprolol succinate  12.5 mg Oral Daily   multivitamin with minerals  1 tablet Oral Daily    Social History   Tobacco Use   Smoking status: Former   Smokeless tobacco: Never  Scientific laboratory technician Use: Never used  Substance Use Topics   Alcohol use: No    Alcohol/week: 0.0 standard drinks   Drug use: No    Family History  Problem Relation Age of Onset   Hypertension Mother    Arthritis Mother    Gout Mother    Lung cancer Father    Heart attack Sister      Review of Systems  Constitutional: Negative for fever, chills, diaphoresis, activity change, +appetite change, +fatigue and +unexpected weight loss HENT: Negative for congestion, sore throat, rhinorrhea, sneezing, trouble swallowing and sinus pressure.  Eyes: Negative for photophobia and visual disturbance.  Respiratory: +for cough, chest tightness, shortness of breath, wheezing and stridor.  Cardiovascular: Negative for chest pain, palpitations and leg swelling.  Gastrointestinal: Negative for nausea, vomiting, abdominal pain, diarrhea, constipation, blood  in stool, abdominal distention and anal bleeding.  Genitourinary: Negative for dysuria, hematuria, flank pain and difficulty urinating.  Musculoskeletal: Negative for myalgias, back pain, joint swelling, arthralgias and gait problem.  Skin: Negative for color change, pallor, rash and wound.  Neurological: Negative for dizziness, tremors, weakness and light-headedness.  Hematological: Negative for adenopathy. Does not bruise/bleed easily.  Psychiatric/Behavioral: Negative for behavioral problems, confusion, sleep disturbance, dysphoric mood, decreased concentration and agitation.     OBJECTIVE: Temp:  [97.5 F (36.4 C)-99.1 F (37.3 C)] 97.9 F (36.6 C) (03/09 1345) Pulse Rate:  [84-118] 92 (03/09 1300) Resp:  [16-31] 23 (03/09 1300) BP: (94-130)/(39-72) 116/47 (03/09 1300) SpO2:  [94 %-100 %] 100 % (03/09 1300) Physical Exam  Constitutional:  oriented to person, place, and time. appears undernourished and frail. No distress.  HENT: [place]/AT, PERRLA, no scleral icterus Mouth/Throat: Oropharynx is clear and moist. No oropharyngeal exudate.  Cardiovascular: Normal rate, regular rhythm and normal heart sounds. Exam reveals no gallop and no friction rub.  No murmur heard.  Pulmonary/Chest: Effort normal and breath sounds normal. No respiratory distress.  has no wheezes.  Neck = supple, no nuchal rigidity Abdominal: Soft. Bowel sounds are normal.  exhibits no distension. There is no tenderness.  Lymphadenopathy: no cervical adenopathy. No axillary adenopathy Neurological: alert and oriented to person, place, and time.  Skin: Skin is warm and dry. No rash noted. No erythema.  Psychiatric: a normal mood and affect.  behavior is normal.    LABS: Results for orders placed or performed during the hospital encounter of 03/21/21 (from the past 48 hour(s))  Culture, blood (Routine x 2)     Status: None (Preliminary result)   Collection Time: 03/21/21 10:18 AM   Specimen: BLOOD  Result Value Ref Range   Specimen Description BLOOD PORT    Special Requests      BOTTLES DRAWN AEROBIC AND ANAEROBIC Blood Culture adequate volume   Culture      NO GROWTH < 24 HOURS Performed at [facility], [address] [place], [place] [address]    Report Status PENDING   Protime-INR     Status: None   Collection Time: 03/21/21 10:19 AM  Result Value Ref Range   Prothrombin Time 14.7 11.4 - 15.2 seconds   INR 1.1 0.8 - 1.2    Comment: (NOTE) INR goal varies based on device and disease states. Performed at [facility], [address] [place], [place]  [address]   Culture, blood (Routine x 2)     Status: None (Preliminary result)   Collection Time: 03/21/21 10:19 AM   Specimen: BLOOD  Result Value Ref Range   Specimen Description BLOOD RIGHT HAND    Special Requests      BOTTLES DRAWN AEROBIC AND ANAEROBIC Blood Culture adequate volume   Culture      NO GROWTH < 24 HOURS Performed at [facility], [address], [place], [place] [address]    Report Status PENDING   Lactic acid, plasma     Status: None   Collection Time: 03/21/21 10:32 AM  Result Value Ref Range   Lactic Acid, Venous 1.6 0.5 - 1.9 mmol/L    Comment: Performed at [facility], [address] [place], [place] [address]  Resp Panel by RT-PCR (Flu A&B, Covid) Nasopharyngeal Swab     Status: None   Collection Time: 03/21/21 10:32 AM   Specimen: Nasopharyngeal Swab; Nasopharyngeal(NP) swabs in vial transport medium  Result Value Ref Range  SARS Coronavirus 2 by RT PCR NEGATIVE NEGATIVE    Comment: (NOTE) SARS-CoV-2 target nucleic acids are NOT DETECTED.  The SARS-CoV-2 RNA is generally detectable in upper respiratory specimens during the acute phase of infection. The lowest concentration of SARS-CoV-2 viral copies this assay can detect is 138 copies/mL. A negative result does not preclude SARS-Cov-2 infection and should not be used as the sole basis for treatment or other patient management decisions. A negative result may occur with  improper specimen collection/handling, submission of specimen other than nasopharyngeal swab, presence of viral mutation(s) within the areas targeted by this assay, and inadequate number of viral copies(<138 copies/mL). A negative result must be combined with clinical observations, patient history, and epidemiological information. The expected result is Negative.  Fact Sheet for Patients:  EntrepreneurPulse.com.au  Fact Sheet for Healthcare Providers:   IncredibleEmployment.be  This test is no t yet approved or cleared by the [place] FDA and  has been authorized for detection and/or diagnosis of SARS-CoV-2 by FDA under an Emergency Use Authorization (EUA). This EUA will remain  in effect (meaning this test can be used) for the duration of the COVID-19 declaration under Section 564(b)(1) of the Act, 21 U.S.C.section 360bbb-3(b)(1), unless the authorization is terminated  or revoked sooner.       Influenza A by PCR NEGATIVE NEGATIVE   Influenza B by PCR NEGATIVE NEGATIVE    Comment: (NOTE) The Xpert Xpress SARS-CoV-2/FLU/RSV plus assay is intended as an aid in the diagnosis of influenza from Nasopharyngeal swab specimens and should not be used as a sole basis for treatment. Nasal washings and aspirates are unacceptable for Xpert Xpress SARS-CoV-2/FLU/RSV testing.  Fact Sheet for Patients: EntrepreneurPulse.com.au  Fact Sheet for Healthcare Providers: IncredibleEmployment.be  This test is not yet approved or cleared by the [place] FDA and has been authorized for detection and/or diagnosis of SARS-CoV-2 by FDA under an Emergency Use Authorization (EUA). This EUA will remain in effect (meaning this test can be used) for the duration of the COVID-19 declaration under Section 564(b)(1) of the Act, 21 U.S.C. section 360bbb-3(b)(1), unless the authorization is terminated or revoked.  Performed at [facility], [address] [place], [place] [address]   Comprehensive metabolic panel     Status: Abnormal   Collection Time: 03/21/21  1:20 PM  Result Value Ref Range   Sodium 131 (L) 135 - 145 mmol/L   Potassium 4.0 3.5 - 5.1 mmol/L   Chloride 104 98 - 111 mmol/L   CO2 21 (L) 22 - 32 mmol/L   Glucose, Bld 110 (H) 70 - 99 mg/dL    Comment: Glucose reference range applies only to samples taken after fasting for at least 8 hours.   BUN 16 8 - 23 mg/dL    Creatinine, Ser 0.73 0.44 - 1.00 mg/dL   Calcium 7.8 (L) 8.9 - 10.3 mg/dL   Total Protein 6.3 (L) 6.5 - 8.1 g/dL   Albumin 2.3 (L) 3.5 - 5.0 g/dL   AST 20 15 - 41 U/L   ALT 14 0 - 44 U/L   Alkaline Phosphatase 86 38 - 126 U/L   Total Bilirubin 0.6 0.3 - 1.2 mg/dL   GFR, Estimated >60 >60 mL/min    Comment: (NOTE) Calculated using the CKD-EPI Creatinine Equation (2021)    Anion gap 6 5 - 15    Comment: Performed at [facility], [address], [place], [place] [address]  CBC WITH DIFFERENTIAL     Status: Abnormal  Collection Time: 03/21/21  1:20 PM  Result Value Ref Range   WBC 14.9 (H) 4.0 - 10.5 K/uL   RBC 2.52 (L) 3.87 - 5.11 MIL/uL   Hemoglobin 8.0 (L) 12.0 - 15.0 g/dL   HCT 24.3 (L) 36.0 - 46.0 %   MCV 96.4 80.0 - 100.0 fL   MCH 31.7 26.0 - 34.0 pg   MCHC 32.9 30.0 - 36.0 g/dL   RDW 15.5 11.5 - 15.5 %   Platelets 239 150 - 400 K/uL   nRBC 0.0 0.0 - 0.2 %   Neutrophils Relative % 90 %   Neutro Abs 13.4 (H) 1.7 - 7.7 K/uL   Lymphocytes Relative 5 %   Lymphs Abs 0.8 0.7 - 4.0 K/uL   Monocytes Relative 4 %   Monocytes Absolute 0.6 0.1 - 1.0 K/uL   Eosinophils Relative 0 %   Eosinophils Absolute 0.0 0.0 - 0.5 K/uL   Basophils Relative 0 %   Basophils Absolute 0.0 0.0 - 0.1 K/uL   Immature Granulocytes 1 %   Abs Immature Granulocytes 0.10 (H) 0.00 - 0.07 K/uL    Comment: Performed at [facility], [address] [place], [place] [address]  Procalcitonin - Baseline     Status: None   Collection Time: 03/21/21  1:20 PM  Result Value Ref Range   Procalcitonin <0.10 ng/mL    Comment:        Interpretation: PCT (Procalcitonin) <= 0.5 ng/mL: Systemic infection (sepsis) is not likely. Local bacterial infection is possible. (NOTE)       Sepsis PCT Algorithm           Lower Respiratory Tract                                      Infection PCT Algorithm    ----------------------------     ----------------------------         PCT < 0.25 ng/mL                 PCT < 0.10 ng/mL          Strongly encourage             Strongly discourage   discontinuation of antibiotics    initiation of antibiotics    ----------------------------     -----------------------------       PCT 0.25 - 0.50 ng/mL            PCT 0.10 - 0.25 ng/mL               OR       >80% decrease in PCT            Discourage initiation of                                            antibiotics      Encourage discontinuation           of antibiotics    ----------------------------     -----------------------------         PCT >= 0.50 ng/mL              PCT 0.26 - 0.50 ng/mL               AND        <  80% decrease in PCT             Encourage initiation of                                             antibiotics       Encourage continuation           of antibiotics    ----------------------------     -----------------------------        PCT >= 0.50 ng/mL                  PCT > 0.50 ng/mL               AND         increase in PCT                  Strongly encourage                                      initiation of antibiotics    Strongly encourage escalation           of antibiotics                                     -----------------------------                                           PCT <= 0.25 ng/mL                                                 OR                                        > 80% decrease in PCT                                      Discontinue / Do not initiate                                             antibiotics  Performed at [facility], [address] [place], [place] [address]   Urinalysis, Routine w reflex microscopic     Status: Abnormal   Collection Time: 03/21/21  9:25 PM  Result Value Ref Range   Color, Urine YELLOW (A) YELLOW   APPearance CLEAR (A) CLEAR   Specific Gravity, Urine 1.036 (H) 1.005 - 1.030   pH 5.0 5.0 - 8.0   Glucose, UA NEGATIVE NEGATIVE mg/dL   Hgb urine dipstick NEGATIVE NEGATIVE   Bilirubin Urine NEGATIVE  NEGATIVE   Ketones, ur NEGATIVE NEGATIVE mg/dL   Protein, ur NEGATIVE NEGATIVE mg/dL   Nitrite NEGATIVE NEGATIVE  Leukocytes,Ua TRACE (A) NEGATIVE   RBC / HPF 0-5 0 - 5 RBC/hpf   WBC, UA 0-5 0 - 5 WBC/hpf   Bacteria, UA NONE SEEN NONE SEEN   Squamous Epithelial / LPF 0-5 0 - 5   Mucus PRESENT     Comment: Performed at [facility], [address] [place], [place] [address]  CBC     Status: Abnormal   Collection Time: 03/22/21  4:39 AM  Result Value Ref Range   WBC 10.9 (H) 4.0 - 10.5 K/uL   RBC 2.19 (L) 3.87 - 5.11 MIL/uL   Hemoglobin 6.8 (L) 12.0 - 15.0 g/dL   HCT 21.3 (L) 36.0 - 46.0 %   MCV 97.3 80.0 - 100.0 fL   MCH 31.1 26.0 - 34.0 pg   MCHC 31.9 30.0 - 36.0 g/dL   RDW 15.9 (H) 11.5 - 15.5 %   Platelets 206 150 - 400 K/uL   nRBC 0.0 0.0 - 0.2 %    Comment: Performed at [facility], [address] [place], [place] [address]  Basic metabolic panel     Status: Abnormal   Collection Time: 03/22/21  4:39 AM  Result Value Ref Range   Sodium 133 (L) 135 - 145 mmol/L   Potassium 3.3 (L) 3.5 - 5.1 mmol/L   Chloride 103 98 - 111 mmol/L   CO2 20 (L) 22 - 32 mmol/L   Glucose, Bld 106 (H) 70 - 99 mg/dL    Comment: Glucose reference range applies only to samples taken after fasting for at least 8 hours.   BUN 13 8 - 23 mg/dL   Creatinine, Ser 0.66 0.44 - 1.00 mg/dL   Calcium 7.7 (L) 8.9 - 10.3 mg/dL   GFR, Estimated >60 >60 mL/min    Comment: (NOTE) Calculated using the CKD-EPI Creatinine Equation (2021)    Anion gap 10 5 - 15    Comment: Performed at [facility], [address] [place], [place] [address]  Type and screen [place]     Status: None (Preliminary result)   Collection Time: 03/22/21  7:19 AM  Result Value Ref Range   ABO/RH(D) O POS    Antibody Screen NEG    Sample Expiration 03/25/2021,2359    Unit Number [id number]    Blood Component Type RED CELLS,LR    Unit division 00    Status of Unit ISSUED     Transfusion Status OK TO TRANSFUSE    Crossmatch Result      Compatible Performed at [facility], [address] [place], [place] [address]   Prepare RBC (crossmatch)     Status: None   Collection Time: 03/22/21  8:00 AM  Result Value Ref Range   Order Confirmation      ORDER PROCESSED BY BLOOD BANK Performed at [facility], [address], [place],  [address]   MRSA Next Gen by PCR, Nasal     Status: None   Collection Time: 03/22/21 12:32 PM   Specimen: Nasal Mucosa; Nasal Swab  Result Value Ref Range   MRSA by PCR Next Gen NOT DETECTED NOT DETECTED    Comment: (NOTE) The GeneXpert MRSA Assay (FDA approved for NASAL specimens only), is one component of a comprehensive MRSA colonization surveillance program. It is not intended to diagnose MRSA infection nor to guide or monitor treatment for MRSA infections. Test performance is not FDA approved in patients less than 27 years old. Performed at [facility],  [place], [place] [address]   Hemoglobin and hematocrit, blood     Status: Abnormal   Collection Time: 03/22/21 12:33 PM  Result Value Ref Range   Hemoglobin 8.5 (L) 12.0 - 15.0 g/dL    Comment: POST TRANSFUSION SPECIMEN   HCT 26.0 (L) 36.0 - 46.0 %    Comment: Performed at [facility], [address] [place], [place] [address]  Glucose, capillary     Status: Abnormal   Collection Time: 03/22/21  1:59 PM  Result Value Ref Range   Glucose-Capillary 63 (L) 70 - 99 mg/dL    Comment: Glucose reference range applies only to samples taken after fasting for at least 8 hours.  Glucose, capillary     Status: Abnormal   Collection Time: 03/22/21  2:28 PM  Result Value Ref Range   Glucose-Capillary 67 (L) 70 - 99 mg/dL    Comment: Glucose reference range applies only to samples taken after fasting for at least 8 hours.  Glucose, capillary     Status: Abnormal   Collection Time: 03/22/21  4:19 PM  Result Value Ref  Range   Glucose-Capillary 106 (H) 70 - 99 mg/dL    Comment: Glucose reference range applies only to samples taken after fasting for at least 8 hours.    MICRO: reviewed IMAGING: CT Angio Chest PE W and/or Wo Contrast  Result Date: 03/21/2021 CLINICAL DATA:  Shortness of breath with cough and congestion for 2-3 days. Low O2 sats. Lung cancer. * onc * EXAM: CT ANGIOGRAPHY CHEST WITH CONTRAST TECHNIQUE: Multidetector CT imaging of the chest was performed using the standard protocol during bolus administration of intravenous contrast. Multiplanar CT image reconstructions and MIPs were obtained to evaluate the vascular anatomy. RADIATION DOSE REDUCTION: This exam was performed according to the departmental dose-optimization program which includes automated exposure control, adjustment of the mA and/or kV according to patient size and/or use of iterative reconstruction technique. CONTRAST:  40mL OMNIPAQUE IOHEXOL 350 MG/ML SOLN COMPARISON:  11/05/2020. FINDINGS: Cardiovascular: Negative for pulmonary embolus. Right IJ Port-A-Cath traverses a stenotic right internal jugular vein, terminating in the right atrium. Atherosclerotic calcification of the aorta. Heart is enlarged. Left ventricle appears dilated. No pericardial effusion. Mediastinum/Nodes: Mediastinal lymph nodes measure up to 11 mm in the low right paratracheal station, increased from 9 mm on 11/05/2020. Enlarging bihilar lymph nodes, measuring up to 10 mm on the left (4/31), previously 9 mm. Calcified subcarinal lymph node. No axillary adenopathy. Esophagus is grossly unremarkable. Lungs/Pleura: Centrilobular emphysema. New peribronchovascular nodularity and consolidation in the left upper and left lower lobes with minimal involvement of the right middle and right lower lobes. Interval left lower lobectomy. Thick-walled cavitary mass in the posterior left upper lobe measures approximately 4.8 x 6.4 cm and contains an air-fluid level. No right pleural  fluid. Airway is otherwise unremarkable. Upper Abdomen: Visualized portions of the liver and adrenal glands are unremarkable. Mild scarring in the kidneys bilaterally. Visualized portions of the kidneys, spleen, pancreas, stomach and bowel are otherwise unremarkable. No upper abdominal adenopathy. Musculoskeletal: Degenerative changes in the spine. Old left rib fracture or thoracotomy. No worrisome lytic or sclerotic lesions. Review of the MIP images confirms the above findings. IMPRESSION: 1. Negative for pulmonary embolus. 2. Interval left lower lobectomy. Thick-walled cavitary mass in the posterior left upper lobe is indicative of an abscess with or without a bronchopleural or alveolar pleural fistula. 3. Multi lobar pneumonia, worst in the left upper and left lower lobes. 4. Enlarging  mediastinal and hilar lymph nodes, possibly reactive. Difficult to exclude malignancy. 5.  Aortic atherosclerosis (ICD10-I70.0). 6.  Emphysema (ICD10-J43.9). Electronically Signed   By: Lorin Picket M.D.   On: 03/21/2021 13:55   DG Chest Portable 1 View  Result Date: 03/21/2021 CLINICAL DATA:  Shortness of breath, cough and congestion, decreased O2 sats. EXAM: PORTABLE CHEST 1 VIEW COMPARISON:  03/14/2021 and CT chest 11/05/2020. FINDINGS: Right IJ power port tip is in the SVC. Heart size stable. Thoracic aorta is calcified. Postoperative volume loss in the left hemithorax with an elevated left hemidiaphragm. Increasing patchy airspace consolidation in the left lung base with a small left pleural effusion. Right lung is grossly clear. IMPRESSION: 1. Increasing patchy airspace opacification in the lower left hemithorax, worrisome for pneumonia. 2. Small left pleural effusion. Electronically Signed   By: Lorin Picket M.D.   On: 03/21/2021 10:37    HISTORICAL MICRO/IMAGING  Assessment/Plan:  81yo F with hx of lung ca, with unintentional weight loss, failure to thrive, severe protein-calorie malnutrtion. Chemo on hold x 4  wk with worsening productive cough, DOE, found to have LUL cavitary pneumonia. No risk factors for mTB  - continue to treat for bacterial process with vanco/ceftriaxone/metronidazole - will discuss if need to challenge pcn allergy - please get sputum cx, aspergillus ag/ab, afb culture (for NTM), fungal sputum culture  Severe protein calorie malnutrition = continue with supplementation with protein drinks  Hx of lung ca = continue to hold chemo per oncology  Caren Griffins B. [place] [phone]

## 2021-03-22 NOTE — Progress Notes (Signed)
Pt received from ED around 1 pm in NAD. Alert and oriented and denies pain, accompanied by family members as well. Placed on telemetry monitor, shows sinus tach. Right chest port is access and pt states was accessed in the ED. Dressing loose, so new transparent dressing placed. Line flushed and blood return noted. Also has right arm PIV. Pt noted to have low blood sugar despite stating that she ate lunch in ED. Given juice as well as Ensure, and blood sugar normalized. Sacral wound noted; pt states that this occurred at home, prior to arrival. Foam place on sacrum. Hands cool, finger tips and tips of toes cyanotic, and pulse ox intermittently reading. On 2 LNC, increased to 4 LNC. Denies SOB. Diminished breath sounds esp on left. On and off bedpan to urinate, unable to measure due to difficulty with positioning of pan. Purewick applied. Pt states her appetite has been poor at home, but is improved.  ?

## 2021-03-22 NOTE — Progress Notes (Signed)
[facility] Progress Note ? ?Patient: [name]    PYP:[id number]  DOA: 03/21/2021    ?Date of Service: the patient was seen and examined on 03/22/2021 ? 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  In emergency room, patient found to have sepsis from multilobar pneumonia and possible lung abscess causing acute respiratory failure with hypoxia.  Admitted to the hospitalist service and started on IV fluids and antibiotics. ? ?Assessment and Plan: ?Assessment and Plan: ?* Community acquired pneumonia complicated by underlying lung cancer, apparent lung abscess on CT Chest  ?Given abscess, covering with broad spectrum antibiotics w/ MRSA coverage.  Clinically improving.  Discussing with infectious disease and pulmonary about draining of abscess and adjustment of antibiotics. ? ?Sepsis with acute hypoxic respiratory failure without septic shock ([place]) ?Oxygenation improving.  Patient not on oxygen at home.  Sepsis looks to be stabilized.  White count improving. ? ?Acute anemia ?Baseline hemoglobin looks to be around 8.5-9.5 and came in with hemoglobin of 8.0, but then dropped to 6.8 within 14 hours.  Etiology unclear.  Does not look like pancytopenia.  Does not appear to be hemoconcentration.  She is 2 weeks out from chemotherapy.  Checking Hemoccult.  Patient received 1 unit packed red blood cells.  Following H&H closely.  Posttransfusion, hemoglobin up to 8.5. ? ?Acute respiratory failure with hypoxia ([place]) ?Doing well SpO2 100% on 2L [place] ?Patient is full code - agrees to invasive ventilatory support and CPR if needed  ? ?Chronic systolic CHF (congestive heart failure) ([place]) ?Initially look to be slightly dry.  Blood pressures  improved with blood.  Suspect that she likely will go into volume overload, will check BNP in the morning. ? 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  Last chemotherapy 2 weeks ago. ? ?Coronary artery disease s/p CABG ?continue statin, holding other meds as noted except lower dose beta blocker  ? ?Protein calorie malnutrition ([place]) ?Low albumin, chronic illness.  Nutrition to see. ? ?Essential hypertension ?Holding some antihypertensives due to hypotension from sepsis and blood loss.  Pressures improved with blood and fluids. ? ?HLD (hyperlipidemia) ?Continue statin  ? ?Hyponatremia ?Improved w/ NS, likely dehydration ?Repeat BMP in AM ? ?PAD (peripheral artery disease) ([place]) ?Continue statin  ? ?Pressure injury of skin ?Pressure Injury 03/22/21 Sacrum Mid Stage 2 -  Partial thickness loss of dermis presenting as a shallow open injury with a red, pink wound bed without slough. (Active)  ?03/22/21 1300  ?Location: [place]  ?Location Orientation: Mid  ?Staging: Stage 2 -  Partial thickness loss of dermis presenting as a shallow open injury with a red, pink wound bed without slough.  ?Wound Description (Comments):   ?Present on Admission: Yes  ? ?Stage II sacral decubitus, present on admission.  Wound care as directed. ? ? ? ? ?Sinus tachycardia ?See above re: A&P for CAP ?Tachycardic and soft BP ?Holding most home antihypertensives  ?Reducing dose home metoprolol - she has not taken meds this AM and rebound tachycardia may be concurrent w/ sepsis, will see if HR improves w/ BB but close monitor VS  ?Hx HFrEF, caution w/ IV fluids to avoid  precipitation HF exacerbation  ? ? ? ? ? ? ?There is no height or weight on file to calculate BMI.  ?  ?Pressure Injury 03/22/21 Sacrum Mid Stage 2 -  Partial thickness loss of dermis presenting as a shallow open injury with a red, pink wound bed without slough. (Active)  ?03/22/21 1300  ?Location: Sac[place]   ?Location Orientation: Mid  ?Staging: Stage 2 -  Partial thickness loss of dermis presenting as a shallow open injury with a red, pink wound bed without slough.  ?Wound Description (Comments):   ?Present on Admission: Yes  ?  ? ?Consultants: ?Infectious disease ?Pulmonary ? ?Procedures: ?None although considering interventional radiology guidance drainage of abscess ? ?Antimicrobials: ?IV vancomycin 3/8-present ?IV doxycycline 3/8-present ?IV Rocephin 3/8-present ? ?Code Status: Full code ? ? ?Subjective: Patient states breathing is better. ? ?Objective: ?Mild tachypnea, improving ?Vitals:  ? 03/22/21 1300 03/22/21 1345  ?BP: (!) 116/47   ?Pulse: 92   ?Resp: (!) 23   ?Temp:  97.9 ?F (36.6 ?C)  ?SpO2: 100%   ? ? ?Intake/Output Summary (Last 24 hours) at 03/22/2021 1447 ?Last data filed at 03/22/2021 1300 ?Gross per 24 hour  ?Intake 1365.12 ml  ?Output --  ?Net 1365.12 ml  ? ?There were no vitals filed for this visit. ?There is no height or weight on file to calculate BMI. ? ?Exam: ? ?General: Alert and oriented x3, no acute distress ?HEENT: Normocephalic, atraumatic, mucous membranes slightly dry ?Cardiovascular: Regular rate and rhythm, S1-S2 ?Respiratory: Bilateral rales ?Abdomen: Soft, nontender, nondistended, positive bowel sounds ?Musculoskeletal: No clubbing or cyanosis, trace pitting edema ?Skin: No skin breaks, tears or lesions.  Exception stage II sacral decub ?Psychiatry: Appropriate, no evidence of psychoses ?Neurology: No focal deficits ? ?Data Reviewed: ?Improvement in white blood cell count ? ?Disposition:  ?Status is: Inpatient ?Remains inpatient appropriate because: Further stabilization of infection ?  ? ?Family Communication: Granddaughter at the bedside ?DVT Prophylaxis: ?enoxaparin (LOVENOX) injection 30 mg Start: 03/21/21 2200 ? ? ? ?Author: ?Annita Brod ,MD ?03/22/2021 2:47 PM ? ?To reach On-call, see care teams to locate the attending and reach out via [email or web address]. ?Between 7PM-7AM, please  contact night-coverage ?If you still have difficulty reaching the attending provider, please page the [facility] (Director on Call) for [facility] on amion for assistance. ? ?

## 2021-03-22 NOTE — Progress Notes (Signed)
Admission profile updated. ?

## 2021-03-22 NOTE — Assessment & Plan Note (Addendum)
Oxygenation improving.  Patient not on oxygen at home.  Sepsis initially stabilized, but attempts to stop Flagyl/Rocephin and start Unasyn led to worsening white count and increasing lactic acid on 3/12 so antibiotics changed back to original regimen that evening plus IV fluids and Lasix stopped.  By 3/13, patient doing better and with lactic acid level and white blood cell count normalizing, fluids stopped, Lasix restarted with plans to keep patient on original regimen of vancomycin, Flagyl and Rocephin.   ?

## 2021-03-22 NOTE — Progress Notes (Signed)
Initial Nutrition Assessment ? ?DOCUMENTATION CODES:  ? ?Severe malnutrition in context of chronic illness ? ?INTERVENTION:  ? ?-Ensure Enlive po TID, each supplement provides 350 kcal and 20 grams of protein ?-MVI with minerals daily ?-500 mg vitamin C BID ?-220 mg zinc sulfate daily x 14 days ?-Liberalize diet to regular for increased variety of meal selections to promote adequate oral intake ? ?NUTRITION DIAGNOSIS:  ? ?Severe Malnutrition related to chronic illness (stage 3 lung cancer) as evidenced by moderate muscle depletion, severe muscle depletion, moderate fat depletion, severe fat depletion, energy intake < or equal to 75% for > or equal to 1 month, percent weight loss. ? ?GOAL:  ? ?Patient will meet greater than or equal to 90% of their needs ? ?MONITOR:  ? ?PO intake, Supplement acceptance, Labs, Weight trends, Skin, I & O's ? ?REASON FOR ASSESSMENT:  ? ?Consult ?Assessment of nutrition requirement/status ? ?ASSESSMENT:  ? ?[name] is a 81 y.o. 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 To ED 03/21/21 from cancer center complaining of shortness of breath cough and congestion x3 days.  Found to be hypoxic over at the cancer center (86-90% on RA) and sent to the ED on nasal cannula.  She does not wear oxygen at home.  She reports shortness of breath constantly, worse laying down.  Denies chest pain. ? ?Pt admitted with CAP complicated by underlying lung cancer and apparent lung abscess on chest CT.  ?  ?Reviewed I/O's: +1.5 L x 24 hours ? ?Case discussed with [place] [facility] Lope) who reports pt with very poor oral intake over the past few weeks.  ? ?Spoke with pt at bedside, who reports feeling better since being hospitalized. Her only complaint is that she is cold; RD adjusted thermostat in her room and covered her with blankets. Pt shares that her appetite has improved greatly since being admitted;  she consumed a few bites of egg and sausage at breakfast and consumed about 50% of lunch meal.  ? ?PTA pt shares that she has experienced decreased appetite secondary to side effects of chemotherapy. Per pt, she often grazes on grapes and vegetables throughout the day. She usually will eat one meal per day that is prepared by her daughter or grandson who live near her (spaghetti or meatloaf and mashed potatoes). Pt lives alone, but denies any functional deficits or recent weakness or falls. Pt shares that she still drives, but her family have been driving her to appointments recently "because they think I'm old".  ? ?Pt reports her UBW is around 120# and estimates she has lost about 10# over the past month. Reviewed wt hx; pt has experienced a 12.3% wt loss over the past month, which is significant for time frame.  ? ?Discussed importance of good meal and supplement intake to promote healing. Pt shares that she has not yet tried samples of supplements provided by cancer center RD. Pt shares that "I would rather not take them if I don't need to". RD dicussed concern over decreased oral intake and weight loss and importance of consuming adequate calories and protein to help preserve lean body mass and promote adequate nutrition to help with cancer treatments. She is amenable to try with RD encouragement.  ? ?Medications reviewed and include calcium with vitamin D and magnesium oxide.  ? ?Labs reviewed: Na: 133, K: 3.3.   ? ?NUTRITION - FOCUSED PHYSICAL EXAM: ? ?  Flowsheet Row Most Recent Value  ?Orbital Region Severe depletion  ?Upper Arm Region Severe depletion  ?Thoracic and Lumbar Region Mild depletion  ?Buccal Region Moderate depletion  ?Temple Region Severe depletion  ?Clavicle Bone Region Severe depletion  ?Clavicle and Acromion Bone Region Severe depletion  ?Scapular Bone Region Severe depletion  ?Dorsal Hand Moderate depletion  ?Patellar Region Severe depletion  ?Anterior Thigh Region Severe depletion   ?Posterior Calf Region Severe depletion  ?Edema (RD Assessment) None  ?Hair Reviewed  ?Eyes Reviewed  ?Mouth Reviewed  ?Skin Reviewed  ?Nails Reviewed  ? ?  ? ? ?Diet Order:   ?Diet Order   ? ?       ?  Diet regular Room service appropriate? Yes; Fluid consistency: Thin  Diet effective now       ?  ? ?  ?  ? ?  ? ? ?EDUCATION NEEDS:  ? ?Education needs have been addressed ? ?Skin:  Skin Assessment: Skin Integrity Issues: ?Skin Integrity Issues:: Stage II ?Stage II: sacrum ? ?Last BM:  Unknown ? ?Height:  ? ?Ht Readings from Last 1 Encounters:  ?03/14/21 5' (1.524 m)  ? ? ?Weight:  ? ?Wt Readings from Last 1 Encounters:  ?03/21/21 42.7 kg  ? ? ?Ideal Body Weight:  45.5 kg ? ?BMI:  There is no height or weight on file to calculate BMI. ? ?Estimated Nutritional Needs:  ? ?Kcal:  1650-1850 ? ?Protein:  80-95 grams ? ?Fluid:  > 1.6 L ? ? ? ?Loistine Chance, RD, LDN, CDCES ?Registered Dietitian II ?Certified Diabetes Care and Education Specialist ?Please refer to [facility] for RD and/or RD on-call/weekend/after hours pager  ?

## 2021-03-23 ENCOUNTER — Encounter: Payer: Self-pay | Admitting: Family

## 2021-03-23 ENCOUNTER — Inpatient Hospital Stay: Payer: Medicare Other

## 2021-03-23 DIAGNOSIS — E43 Unspecified severe protein-calorie malnutrition: Secondary | ICD-10-CM

## 2021-03-23 LAB — BASIC METABOLIC PANEL
Anion gap: 5 (ref 5–15)
BUN: 9 mg/dL (ref 8–23)
CO2: 24 mmol/L (ref 22–32)
Calcium: 7.5 mg/dL — ABNORMAL LOW (ref 8.9–10.3)
Chloride: 103 mmol/L (ref 98–111)
Creatinine, Ser: 0.68 mg/dL (ref 0.44–1.00)
GFR, Estimated: >60 mL/min (ref >60)
Glucose, Bld: 104 mg/dL — ABNORMAL HIGH (ref 70–99)
Potassium: 3 mmol/L — ABNORMAL LOW (ref 3.5–5.1)
Sodium: 132 mmol/L — ABNORMAL LOW (ref 135–145)

## 2021-03-23 LAB — BPAM RBC
Blood Product Expiration Date: 202304072359
ISSUE DATE / TIME: 202303090940
Unit Type and Rh: 5100

## 2021-03-23 LAB — PROCALCITONIN: Procalcitonin: <0.1 ng/mL

## 2021-03-23 LAB — URINE CULTURE: Culture: <10000 — AB

## 2021-03-23 LAB — CBC
HCT: 24.1 % — ABNORMAL LOW (ref 36.0–46.0)
Hemoglobin: 7.9 g/dL — ABNORMAL LOW (ref 12.0–15.0)
MCH: 30.6 pg (ref 26.0–34.0)
MCHC: 32.8 g/dL (ref 30.0–36.0)
MCV: 93.4 fL (ref 80.0–100.0)
Platelets: 168 10*3/uL (ref 150–400)
RBC: 2.58 MIL/uL — ABNORMAL LOW (ref 3.87–5.11)
RDW: 16.1 % — ABNORMAL HIGH (ref 11.5–15.5)
WBC: 7.3 10*3/uL (ref 4.0–10.5)
nRBC: 0 % (ref 0.0–0.2)

## 2021-03-23 LAB — TYPE AND SCREEN
ABO/RH(D): O POS
Antibody Screen: NEGATIVE
Unit division: 0

## 2021-03-23 LAB — BRAIN NATRIURETIC PEPTIDE: B Natriuretic Peptide: 1128.2 pg/mL — ABNORMAL HIGH (ref 0.0–100.0)

## 2021-03-23 MED ORDER — SODIUM CHLORIDE 0.9 % IV SOLN
3.0000 g | Freq: Four times a day (QID) | INTRAVENOUS | Status: DC
  Administered 2021-03-23 – 2021-03-25 (×8): 3 g via INTRAVENOUS
  Filled 2021-03-23 (×2): qty 8
  Filled 2021-03-23: qty 3
  Filled 2021-03-23: qty 8
  Filled 2021-03-23: qty 3
  Filled 2021-03-23: qty 8
  Filled 2021-03-23: qty 3
  Filled 2021-03-23 (×4): qty 8
  Filled 2021-03-23: qty 3

## 2021-03-23 MED ORDER — POTASSIUM CHLORIDE CRYS ER 20 MEQ PO TBCR
40.0000 meq | EXTENDED_RELEASE_TABLET | Freq: Two times a day (BID) | ORAL | Status: DC
  Administered 2021-03-23 – 2021-03-24 (×4): 40 meq via ORAL
  Filled 2021-03-23 (×4): qty 2

## 2021-03-23 MED ORDER — FUROSEMIDE 10 MG/ML IJ SOLN
20.0000 mg | Freq: Two times a day (BID) | INTRAMUSCULAR | Status: DC
  Administered 2021-03-23 – 2021-03-25 (×5): 20 mg via INTRAVENOUS
  Filled 2021-03-23 (×5): qty 2

## 2021-03-23 NOTE — Progress Notes (Signed)
[facility] Progress Note ? ?Patient: [name]    UVO:[id number]  DOA: 03/21/2021    ?Date of Service: the patient was seen and examined on 03/23/2021 ? 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  In emergency room, patient found to have sepsis from multilobar pneumonia and possible lung abscess causing acute respiratory failure with hypoxia.  Admitted to the hospitalist service and started on IV fluids and antibiotics.  Infectious disease consulted recommending antibiotic course and hopefully to avoid bronchoscopy/aspiration of abscess. ? ?By 3/10, clinically improving.  BNP elevated at 1100.  IV fluids stopped and patient started on IV antibiotics. ? ?Assessment and Plan: ?Assessment and Plan: ?* Acute on chronic systolic CHF (congestive heart failure) ([place]) ?Initially looked slightly dry.  With aggressive fluid resuscitation from sepsis, certainly now is in CHF as confirmed by BNP.  With sepsis stabilized, have stopped IV fluids and started IV Lasix.  This should help her oxygenation. ? ?Sepsis with acute hypoxic respiratory failure without septic shock ([place]) ?Oxygenation improving.  Patient not on oxygen at home.  Sepsis looks to be stabilized.  White count improving. ? ?Acute anemia ?Baseline hemoglobin looks to be around 8.5-9.5 and came in with hemoglobin of 8.0, but then dropped to 6.8 within 14 hours.  Etiology unclear.  Does not look like pancytopenia.  Does not appear to be hemoconcentration.  She is 2 weeks out from chemotherapy.  Checking Hemoccult.  Patient received 1 unit packed red blood cells.  Following H&H closely.  Posttransfusion, hemoglobin up to 8.5 and this morning at 7.9. ? ?Community acquired  pneumonia complicated by underlying lung cancer, apparent lung abscess on CT Chest  ?Given abscess, covering with broad spectrum antibiotics w/ MRSA coverage.  Clinically improving.  Infectious disease consulted.  For now, holding off on plans for aspiration or bronchoscopy and antibiotics only. ? ?Acute respiratory failure with hypoxia ([place]) ?Initially only required 2 L, but with fluid resuscitation, also in acute CHF and now needing 4 L.  Should improve with IV Lasix. ? 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  Last chemotherapy 2 weeks ago. ? ?Coronary artery disease s/p CABG ?continue statin, holding other meds as noted except lower dose beta blocker  ? ?Protein-calorie malnutrition, severe ?Nutrition Status: ?Nutrition Problem: Severe Malnutrition ?Etiology: chronic illness (stage 3 lung cancer) ?Signs/Symptoms: moderate muscle depletion, severe muscle depletion, moderate fat depletion, severe fat depletion, energy intake < or equal to 75% for > or equal to 1 month, percent weight loss ?Interventions: Ensure Enlive (each supplement provides 350kcal and 20 grams of protein), MVI, Liberalize Diet ? ? ? ? ?Essential hypertension ?Holding some antihypertensives due to hypotension from sepsis and blood loss.  Pressures improved with blood and fluids. ? ?HLD (hyperlipidemia) ?Continue statin  ? ?Hyponatremia ?Improved w/ NS, likely dehydration ?Repeat BMP in AM ? ?PAD (peripheral artery disease) ([place]) ?Continue statin  ? ?Pressure injury of skin ?Pressure Injury 03/22/21 Sacrum Mid Stage 2 -  Partial thickness loss of dermis presenting as a shallow open injury with a red, pink wound bed without slough. (Active)  ?03/22/21 1300  ?Location: Sacrum  ?Location Orientation: Mid  ?Staging:  Stage 2 -  Partial thickness loss of dermis presenting as a shallow open injury with a red, pink wound bed without slough.  ?Wound Description (Comments):    ?Present on Admission: Yes  ? ?Stage II sacral decubitus, present on admission.  Wound care as directed. ? ? ? ? ?Sinus tachycardia ?See above re: A&P for CAP ?Tachycardic and soft BP ?Holding most home antihypertensives  ?Reducing dose home metoprolol - she has not taken meds this AM and rebound tachycardia may be concurrent w/ sepsis, will see if HR improves w/ BB but close monitor VS  ?Hx HFrEF, caution w/ IV fluids to avoid precipitation HF exacerbation  ? ? ? ? ? ? ?There is no height or weight on file to calculate BMI.  ?Nutrition Problem: Severe Malnutrition ?Etiology: chronic illness (stage 3 lung cancer) ?Pressure Injury 03/22/21 Sacrum Mid Stage 2 -  Partial thickness loss of dermis presenting as a shallow open injury with a red, pink wound bed without slough. (Active)  ?03/22/21 1300  ?Location: Sacrum  ?Location Orientation: Mid  ?Staging: Stage 2 -  Partial thickness loss of dermis presenting as a shallow open injury with a red, pink wound bed without slough.  ?Wound Description (Comments):   ?Present on Admission: Yes  ?  ? ?Consultants: ?Infectious disease ?Pulmonary ? ?Procedures: ?none ? ?Antimicrobials: ?IV vancomycin 3/8-present ?IV doxycycline 3/8-3/9 ?IV Flagyl 3/9-present ?IV Rocephin 3/8-present ? ?Code Status: Full code ? ? ?Subjective: Patient tired, states that breathing is little worse today. ? ?Objective: ?Mild tachypnea, improving ?Vitals:  ? 03/23/21 1128 03/23/21 1439  ?BP: 113/81 (!) 117/55  ?Pulse: (!) 107 97  ?Resp: 17 17  ?Temp: 97.9 ?F (36.6 ?C) 97.8 ?F (36.6 ?C)  ?SpO2: 100% 91%  ? ? ?Intake/Output Summary (Last 24 hours) at 03/23/2021 1524 ?Last data filed at 03/23/2021 1100 ?Gross per 24 hour  ?Intake 673.63 ml  ?Output 575 ml  ?Net 98.63 ml  ? ? ?There were no vitals filed for this visit. ?There is no height or weight on file to calculate BMI. ? ?Exam: ? ?General: Alert and oriented x3, fatigued ?HEENT: Normocephalic, atraumatic, mucous membranes slightly dry ?Cardiovascular:  Regular rate and rhythm, S1-S2 ?Respiratory: Bilateral rales ?Abdomen: Soft, nontender, nondistended, positive bowel sounds ?Musculoskeletal: No clubbing or cyanosis, trace pitting edema ?Skin: No skin breaks, tears or lesions.  Exception stage II sacral decub ?Psychiatry: Appropriate, no evidence of psychoses ?Neurology: No focal deficits ? ?Data Reviewed: ?BNP of 1100.  Potassium of 3.0.  Hemoglobin is 7.9. ? ?Disposition:  ?Status is: Inpatient ?Remains inpatient appropriate because: Further stabilization of infection ?  ? ?Family Communication: Left message for family ?DVT Prophylaxis: ?enoxaparin (LOVENOX) injection 30 mg Start: 03/21/21 2200 ? ? ? ?Author: ?Annita Brod ,MD ?03/23/2021 3:24 PM ? ?To reach On-call, see care teams to locate the attending and reach out via [email or web address]. ?Between 7PM-7AM, please contact night-coverage ?If you still have difficulty reaching the attending provider, please page the [facility] (Director on Call) for [facility] on amion for assistance. ? ?

## 2021-03-23 NOTE — Progress Notes (Signed)
?  [facility] [facility]   ? ?Date of Admission:  03/21/2021   Total days of antibiotics 3 ?        ? ? ?ID: [name] is a 81 y.o. female with lung ca/sp lobectomy with worsening DOE found to have large LUL cavitary pneumonia ?Principal Problem: ?  Acute on chronic systolic CHF (congestive heart failure) ([place]) ?Active Problems: ?  PAD (peripheral artery disease) ([place]) ?  HLD (hyperlipidemia) ?  Essential hypertension ?  Coronary artery disease s/p CABG ?  Acute respiratory failure with hypoxia ([place]) ?  S/P partial lobectomy of lung ?  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  Community acquired pneumonia complicated by underlying lung cancer, apparent lung abscess on CT Chest  ?  Sepsis with acute hypoxic respiratory failure without septic shock ([place]) ?  Hyponatremia ?  Pressure injury of skin ?  Acute anemia ?  Protein-calorie malnutrition, severe ? ? ? ?Subjective: ?Feeling better, still having productive cough, improved. Remains on 5L [place] supplementation. Remains afebrile. Speak and eating without difficulty ? ?Medications:  ? sodium chloride   Intravenous Once  ? atorvastatin  20 mg Oral Daily  ? calcium-vitamin D  1 tablet Oral Daily  ? Chlorhexidine Gluconate Cloth  6 each Topical Daily  ? enoxaparin (LOVENOX) injection  30 mg Subcutaneous Q24H  ? feeding supplement  237 mL Oral TID BM  ? furosemide  20 mg Intravenous BID  ? magnesium oxide  400 mg Oral Daily  ? metoprolol succinate  12.5 mg Oral Daily  ? multivitamin with minerals  1 tablet Oral Daily  ? potassium chloride  40 mEq Oral BID  ? ? ?Objective: ?Vital signs in last 24 hours: ?Temp:  [97.6 ?F (36.4 ?C)-98.3 ?F (36.8 ?C)] 97.8 ?F (36.6 ?C) (03/10 1439) ?Pulse Rate:  [78-132] 97 (03/10 1439) ?Resp:  [11-29] 17 (03/10 1439) ?BP: (108-137)/(41-81) 117/55 (03/10 1439) ?SpO2:  [91 %-100 %] 91 % (03/10 1439) ?Physical Exam  ?Constitutional:  oriented to person, place, and time. appears frail, chronically ill  and under-nourished. No distress.  ?HENT: [place]/AT, PERRLA, no scleral icterus ?Mouth/Throat: Oropharynx is clear and moist. No oropharyngeal exudate.  ?Cardiovascular: Normal rate, regular rhythm and normal heart sounds. Exam reveals no gallop and no friction rub.  ?No murmur heard.  ?Pulmonary/Chest: Effort normal and breath sounds normal. No respiratory distress.  has no wheezes.  ?Neck = supple, no nuchal rigidity ?Abdominal: Soft. Bowel sounds are normal.  exhibits no distension. There is no tenderness.  ?Lymphadenopathy: no cervical adenopathy. No axillary adenopathy ?Neurological: alert and oriented to person, place, and time.  ?Skin: Skin is warm and dry. No rash noted. No erythema.  ?Psychiatric: a normal mood and affect.  behavior is normal.  ? ? ?Lab Results ?Recent Labs  ?  03/22/21 ?0439 03/22/21 ?1233 03/23/21 ?0724  ?WBC 10.9*  --  7.3  ?HGB 6.8* 8.5* 7.9*  ?HCT 21.3* 26.0* 24.1*  ?NA 133*  --  132*  ?K 3.3*  --  3.0*  ?CL 103  --  103  ?CO2 20*  --  24  ?BUN 13  --  9  ?CREATININE 0.66  --  0.68  ? ?Liver Panel ?Recent Labs  ?  03/21/21 ?1696 03/21/21 ?1320  ?PROT 7.1 6.3*  ?ALBUMIN 2.6* 2.3*  ?AST 24 20  ?ALT 17 14  ?ALKPHOS 99 86  ?BILITOT 0.3 0.6  ? ?Sedimentation Rate ?No results for input(s): ESRSEDRATE in  the last 72 hours. ?C-Reactive Protein ?No results for input(s): CRP in the last 72 hours. ? ?Microbiology: ?reviewed ?Studies/Results: ?No results found. ? ? ?Assessment/Plan: ?Acute on chronic respiratory failure = continue on weaning oxygenation if feasible ? ?Cavitary pneumonia = suspect common oral pharynx-microbes but still not sure if to stop MRSA coverage. Please get sputum culture. Will continue for the time being. Will check fungitell, aspergillus ag/ab ? ?Penicillin allergy = upon further questions, suspect error entry. Will do trial of amp/sub and stop ceftriaxone and metronidazole. If she tolerates amp/sub, can discharge on amox/clav for pneumonia. ? ?Dr Lucianne Lei dam to see on Monday/ dr  comer available for questions over the weekend. ? ?Carlyle Basques ?[facility] for Infectious Diseases ?Pager: [phone] ? ?03/23/2021, 4:58 PM ? ? ? ? ? ?

## 2021-03-23 NOTE — Assessment & Plan Note (Signed)
Nutrition Status: ?Nutrition Problem: Severe Malnutrition ?Etiology: chronic illness (stage 3 lung cancer) ?Signs/Symptoms: moderate muscle depletion, severe muscle depletion, moderate fat depletion, severe fat depletion, energy intake < or equal to 75% for > or equal to 1 month, percent weight loss ?Interventions: Ensure Enlive (each supplement provides 350kcal and 20 grams of protein), MVI, Liberalize Diet ? ? ? ?

## 2021-03-23 NOTE — Progress Notes (Signed)
Report called to [facility]; for room 233 ?

## 2021-03-24 LAB — COMPREHENSIVE METABOLIC PANEL
ALT: 12 U/L (ref 0–44)
AST: 17 U/L (ref 15–41)
Albumin: 1.9 g/dL — ABNORMAL LOW (ref 3.5–5.0)
Alkaline Phosphatase: 64 U/L (ref 38–126)
Anion gap: 7 (ref 5–15)
BUN: 10 mg/dL (ref 8–23)
CO2: 26 mmol/L (ref 22–32)
Calcium: 7.8 mg/dL — ABNORMAL LOW (ref 8.9–10.3)
Chloride: 100 mmol/L (ref 98–111)
Creatinine, Ser: 0.74 mg/dL (ref 0.44–1.00)
GFR, Estimated: >60 mL/min (ref >60)
Glucose, Bld: 97 mg/dL (ref 70–99)
Potassium: 3.9 mmol/L (ref 3.5–5.1)
Sodium: 133 mmol/L — ABNORMAL LOW (ref 135–145)
Total Bilirubin: 0.6 mg/dL (ref 0.3–1.2)
Total Protein: 5.8 g/dL — ABNORMAL LOW (ref 6.5–8.1)

## 2021-03-24 LAB — CBC
HCT: 26.8 % — ABNORMAL LOW (ref 36.0–46.0)
Hemoglobin: 9 g/dL — ABNORMAL LOW (ref 12.0–15.0)
MCH: 30.8 pg (ref 26.0–34.0)
MCHC: 33.6 g/dL (ref 30.0–36.0)
MCV: 91.8 fL (ref 80.0–100.0)
Platelets: 187 10*3/uL (ref 150–400)
RBC: 2.92 MIL/uL — ABNORMAL LOW (ref 3.87–5.11)
RDW: 15.7 % — ABNORMAL HIGH (ref 11.5–15.5)
WBC: 6.9 10*3/uL (ref 4.0–10.5)
nRBC: 0 % (ref 0.0–0.2)

## 2021-03-24 LAB — PROCALCITONIN: Procalcitonin: <0.1 ng/mL

## 2021-03-24 LAB — GLUCOSE, CAPILLARY: Glucose-Capillary: 86 mg/dL (ref 70–99)

## 2021-03-24 LAB — BRAIN NATRIURETIC PEPTIDE: B Natriuretic Peptide: 614.1 pg/mL — ABNORMAL HIGH (ref 0.0–100.0)

## 2021-03-24 LAB — MAGNESIUM: Magnesium: 1 mg/dL — ABNORMAL LOW (ref 1.7–2.4)

## 2021-03-24 MED ORDER — METOPROLOL TARTRATE 5 MG/5ML IV SOLN
INTRAVENOUS | Status: AC
  Filled 2021-03-24: qty 5

## 2021-03-24 MED ORDER — METOPROLOL TARTRATE 5 MG/5ML IV SOLN
5.0000 mg | Freq: Four times a day (QID) | INTRAVENOUS | Status: DC
Start: 2021-03-24 — End: 2021-03-27
  Administered 2021-03-24 – 2021-03-27 (×11): 5 mg via INTRAVENOUS
  Filled 2021-03-24 (×11): qty 5

## 2021-03-24 MED ORDER — VANCOMYCIN VARIABLE DOSE PER UNSTABLE RENAL FUNCTION (PHARMACIST DOSING): Status: DC

## 2021-03-24 NOTE — Assessment & Plan Note (Signed)
Replacing as needed 

## 2021-03-24 NOTE — Progress Notes (Signed)
?   03/24/21 2016  ?Assess: MEWS Score  ?Temp 99.1 ?F (37.3 ?C)  ?BP 103/64  ?Pulse Rate (!) 125  ?Resp (!) 24  ?SpO2 99 %  ?O2 Device Nasal Cannula  ?Assess: MEWS Score  ?MEWS Temp 0  ?MEWS Systolic 0  ?MEWS Pulse 2  ?MEWS RR 1  ?MEWS LOC 0  ?MEWS Score 3  ?MEWS Score Color Yellow  ?Assess: if the MEWS score is Yellow or Red  ?Were vital signs taken at a resting state? Yes  ?Focused Assessment No change from prior assessment  ?Does the patient meet 2 or more of the SIRS criteria? No  ?MEWS guidelines implemented *See Row Information* Yes  ?Treat  ?MEWS Interventions Administered scheduled meds/treatments  ?Pain Scale 0-10  ?Pain Score 0  ?Take Vital Signs  ?Increase Vital Sign Frequency  Yellow: Q 2hr X 2 then Q 4hr X 2, if remains yellow, continue Q 4hrs  ?Escalate  ?MEWS: Escalate Yellow: discuss with charge nurse/RN and consider discussing with provider and RRT  ?Notify: Charge Nurse/RN  ?Name of Charge Nurse/RN Notified Riley Nearing  ?Date Charge Nurse/RN Notified 03/24/21  ?Time Charge Nurse/RN Notified 2016  ?Document  ?Progress note created (see row info) Yes  ?Assess: SIRS CRITERIA  ?SIRS Temperature  0  ?SIRS Pulse 1  ?SIRS Respirations  1  ?SIRS WBC 0  ?SIRS Score Sum  2  ? ?Patient receiving scheduled IV metoprolol Q6 hrs.  ?

## 2021-03-24 NOTE — Progress Notes (Addendum)
[facility] Progress Note ? ?Patient: [name]    TFT:[id number]  DOA: 03/21/2021    ?Date of Service: the patient was seen and examined on 03/24/2021 ? 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  In emergency room, patient found to have sepsis from multilobar pneumonia and possible lung abscess causing acute respiratory failure with hypoxia.  Admitted to the hospitalist service and started on IV fluids and antibiotics.  Infectious disease consulted recommending antibiotic course and hopefully to avoid bronchoscopy/aspiration of abscess. ? ?By 3/10, clinically improving.  BNP elevated at 1100.  IV fluids stopped and patient started on IV IV Lasix. ? ?Assessment and Plan: ?Assessment and Plan: ?* Acute on chronic systolic CHF (congestive heart failure) ([place]) ?Initially looked slightly dry.  With aggressive fluid resuscitation from sepsis, certainly now is in CHF as confirmed by BNP.  With sepsis stabilized, have stopped IV fluids and started IV Lasix.  This should help her oxygenation.  Only moderate amount of fluid recorded although patient states she has been urinating quite a bit. ? ?Sepsis with acute hypoxic respiratory failure without septic shock ([place]) ?Oxygenation improving.  Patient not on oxygen at home.  Sepsis looks to be stabilized.  White count improving. ? ?Acute anemia ?Baseline hemoglobin looks to be around 8.5-9.5 and came in with hemoglobin of 8.0, but then dropped to 6.8 within 14 hours.  Etiology unclear.  Does not look like pancytopenia.  Does not appear to be hemoconcentration.  She is 2 weeks out from chemotherapy.  Checking Hemoccult.  Patient received 1 unit packed red blood cells.  Following H&H  closely.  Posttransfusion, hemoglobin up to 8.5 and remained stable. ? ?Community acquired pneumonia complicated by underlying lung cancer, apparent lung abscess on CT Chest  ?Given abscess, covering with broad spectrum antibiotics w/ MRSA coverage.  Clinically improving.  Infectious disease consulted.  For now, holding off on plans for aspiration or bronchoscopy and antibiotics only. ? ?Acute respiratory failure with hypoxia ([place]) ?Initially only required 2 L, but with fluid resuscitation, also in acute CHF and now needing 4 L.  Should improve with IV Lasix.  We will work on weaning oxygen.  She is not on any oxygen at home. ? 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  Last chemotherapy 2 weeks ago. ? ?Coronary artery disease s/p CABG ?continue statin, holding other meds as noted except lower dose beta blocker  ? ?Protein-calorie malnutrition, severe ?Nutrition Status: ?Nutrition Problem: Severe Malnutrition ?Etiology: chronic illness (stage 3 lung cancer) ?Signs/Symptoms: moderate muscle depletion, severe muscle depletion, moderate fat depletion, severe fat depletion, energy intake < or equal to 75% for > or equal to 1 month, percent weight loss ?Interventions: Ensure Enlive (each supplement provides 350kcal and 20 grams of protein), MVI, Liberalize Diet ? ? ? ? ?Essential hypertension ?Holding some antihypertensives due to hypotension from sepsis and blood loss.  Pressures improved with blood and fluids. ? ?HLD (hyperlipidemia) ?Continue statin  ? ?Hyponatremia ?Improved w/ NS, likely dehydration ?Repeat BMP in AM ? ?PAD (peripheral artery disease) ([place]) ?Continue statin  ? ?Hypomagnesemia ?Replacing as needed ? ?Pressure injury of skin ?Pressure Injury 03/22/21 Sacrum  Mid Stage 2 -  Partial thickness loss of dermis presenting as a shallow open injury with a red, pink wound bed without slough. (Active)  ?03/22/21 1300  ?Location: Sacrum   ?Location Orientation: Mid  ?Staging: Stage 2 -  Partial thickness loss of dermis presenting as a shallow open injury with a red, pink wound bed without slough.  ?Wound Description (Comments):   ?Present on Admission: Yes  ? ?Stage II sacral decubitus, present on admission.  Wound care as directed. ? ? ? ? ?Sinus tachycardia ?See above re: A&P for CAP ?Tachycardic and soft BP ?Holding most home antihypertensives  ?Reducing dose home metoprolol - she has not taken meds this AM and rebound tachycardia may be concurrent w/ sepsis, will see if HR improves w/ BB but close monitor VS  ?Hx HFrEF, caution w/ IV fluids to avoid precipitation HF exacerbation  ? ? ? ? ? ? ?There is no height or weight on file to calculate BMI.  ?Nutrition Problem: Severe Malnutrition ?Etiology: chronic illness (stage 3 lung cancer) ?Pressure Injury 03/22/21 Sacrum Mid Stage 2 -  Partial thickness loss of dermis presenting as a shallow open injury with a red, pink wound bed without slough. (Active)  ?03/22/21 1300  ?Location: Sacrum  ?Location Orientation: Mid  ?Staging: Stage 2 -  Partial thickness loss of dermis presenting as a shallow open injury with a red, pink wound bed without slough.  ?Wound Description (Comments):   ?Present on Admission: Yes  ?  ? ?Consultants: ?Infectious disease ?Pulmonary ? ?Procedures: ?none ? ?Antimicrobials: ?IV vancomycin 3/8-present ?IV doxycycline 3/8-3/9 ?IV Flagyl 3/9-3/10 ?IV Rocephin 3/8-3/10 ?IV Unasyn 3/10-present ? ?Code Status: Full code ? ? ?Subjective: Patient worn out.  Not much appetite.  Feels like breathing is still somewhat tenuous ? ?Objective: ?Mild tachypnea, improving ?Vitals:  ? 03/24/21 0710 03/24/21 1143  ?BP: (!) 104/53 (!) 112/57  ?Pulse: 89 95  ?Resp: 17 17  ?Temp: 98.1 ?F (36.7 ?C) 98.1 ?F (36.7 ?C)  ?SpO2: 100% 100%  ? ? ?Intake/Output Summary (Last 24 hours) at 03/24/2021 1615 ?Last data filed at 03/24/2021 1430 ?Gross per 24 hour  ?Intake 879.43 ml  ?Output 850 ml  ?Net 29.43 ml   ? ?There were no vitals filed for this visit. ?There is no height or weight on file to calculate BMI. ? ?Exam: ? ?General: Alert and oriented x3, fatigued ?HEENT: Normocephalic, atraumatic, mucous membranes slightly dry ?Cardiovascular: Regular rate and rhythm, S1-S2 ?Respiratory: Decreased Rales ?Abdomen: Soft, nontender, nondistended, positive bowel sounds ?Musculoskeletal: No clubbing or cyanosis, trace pitting edema ?Skin: No skin breaks, tears or lesions.  Exception stage II sacral decub ?Psychiatry: Appropriate, no evidence of psychoses ?Neurology: No focal deficits ? ?Data Reviewed: ?Today's labs reviewed.  Noted hemoglobin of 9. ? ?Disposition:  ?Status is: Inpatient ?Remains inpatient appropriate because: Final decisions on antibiotic coverage and further diuresis.  Anticipate discharge on 3/14. ?  ? ?Family Communication: Daughter at the bedside ?DVT Prophylaxis: ?enoxaparin (LOVENOX) injection 30 mg Start: 03/21/21 2200 ? ? ? ?Author: ?Annita Brod ,MD ?03/24/2021 4:15 PM ? ?To reach On-call, see care teams to locate the attending and reach out via [email or web address]. ?Between 7PM-7AM, please contact night-coverage ?If you still have difficulty reaching the attending provider, please page the [facility] (Director on Call) for [facility] on amion for assistance. ? ?

## 2021-03-25 LAB — COMPREHENSIVE METABOLIC PANEL
ALT: 12 U/L (ref 0–44)
AST: 19 U/L (ref 15–41)
Albumin: 2 g/dL — ABNORMAL LOW (ref 3.5–5.0)
Alkaline Phosphatase: 62 U/L (ref 38–126)
Anion gap: 8 (ref 5–15)
BUN: 13 mg/dL (ref 8–23)
CO2: 27 mmol/L (ref 22–32)
Calcium: 7.6 mg/dL — ABNORMAL LOW (ref 8.9–10.3)
Chloride: 98 mmol/L (ref 98–111)
Creatinine, Ser: 0.98 mg/dL (ref 0.44–1.00)
GFR, Estimated: 59 mL/min — ABNORMAL LOW (ref >60)
Glucose, Bld: 126 mg/dL — ABNORMAL HIGH (ref 70–99)
Potassium: 4.5 mmol/L (ref 3.5–5.1)
Sodium: 133 mmol/L — ABNORMAL LOW (ref 135–145)
Total Bilirubin: 0.5 mg/dL (ref 0.3–1.2)
Total Protein: 6.2 g/dL — ABNORMAL LOW (ref 6.5–8.1)

## 2021-03-25 LAB — VANCOMYCIN, RANDOM: Vancomycin Rm: 5

## 2021-03-25 LAB — CBC
HCT: 28.7 % — ABNORMAL LOW (ref 36.0–46.0)
HCT: 27.2 % — ABNORMAL LOW (ref 36.0–46.0)
Hemoglobin: 9.4 g/dL — ABNORMAL LOW (ref 12.0–15.0)
Hemoglobin: 8.9 g/dL — ABNORMAL LOW (ref 12.0–15.0)
MCH: 30.7 pg (ref 26.0–34.0)
MCH: 30.6 pg (ref 26.0–34.0)
MCHC: 32.8 g/dL (ref 30.0–36.0)
MCHC: 32.7 g/dL (ref 30.0–36.0)
MCV: 93.8 fL (ref 80.0–100.0)
MCV: 93.5 fL (ref 80.0–100.0)
Platelets: 187 10*3/uL (ref 150–400)
Platelets: 174 10*3/uL (ref 150–400)
RBC: 3.06 MIL/uL — ABNORMAL LOW (ref 3.87–5.11)
RBC: 2.91 MIL/uL — ABNORMAL LOW (ref 3.87–5.11)
RDW: 15.7 % — ABNORMAL HIGH (ref 11.5–15.5)
RDW: 15.6 % — ABNORMAL HIGH (ref 11.5–15.5)
WBC: 14.3 10*3/uL — ABNORMAL HIGH (ref 4.0–10.5)
WBC: 15.8 10*3/uL — ABNORMAL HIGH (ref 4.0–10.5)
nRBC: 0 % (ref 0.0–0.2)
nRBC: 0 % (ref 0.0–0.2)

## 2021-03-25 LAB — LACTIC ACID, PLASMA: Lactic Acid, Venous: 2 mmol/L (ref 0.5–1.9)

## 2021-03-25 MED ORDER — METRONIDAZOLE 500 MG/100ML IV SOLN
500.0000 mg | Freq: Two times a day (BID) | INTRAVENOUS | Status: DC
  Administered 2021-03-25 – 2021-03-26 (×2): 500 mg via INTRAVENOUS
  Filled 2021-03-25 (×4): qty 100

## 2021-03-25 MED ORDER — SODIUM CHLORIDE 0.9 % IV SOLN
2.0000 g | INTRAVENOUS | Status: DC
  Administered 2021-03-25 – 2021-03-26 (×2): 2 g via INTRAVENOUS
  Filled 2021-03-25 (×3): qty 20

## 2021-03-25 MED ORDER — SODIUM CHLORIDE 0.9 % IV SOLN
INTRAVENOUS | Status: DC | PRN
Start: 2021-03-25 — End: 2021-03-26

## 2021-03-25 MED ORDER — POTASSIUM CHLORIDE CRYS ER 20 MEQ PO TBCR
20.0000 meq | EXTENDED_RELEASE_TABLET | Freq: Two times a day (BID) | ORAL | Status: DC
  Administered 2021-03-25 – 2021-03-28 (×7): 20 meq via ORAL
  Filled 2021-03-25 (×7): qty 1

## 2021-03-25 NOTE — Progress Notes (Signed)
[facility] Progress Note ? ?Patient: [name]    RJJ:[id number]  DOA: 03/21/2021    ?Date of Service: the patient was seen and examined on 03/25/2021 ? 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  In emergency room, patient found to have sepsis from multilobar pneumonia and possible lung abscess causing acute respiratory failure with hypoxia.  Admitted to the hospitalist service and started on IV fluids and antibiotics.  Infectious disease consulted recommending antibiotic course and hopefully to avoid bronchoscopy/aspiration of abscess. ? ?By 3/10, clinically improving.  BNP elevated at 1100.  IV fluids stopped and patient started on IV IV Lasix, and patient has since diuresed over 2.5 L, although still net positive.  Patient herself feels a little bit better although today, 3/12, white blood cell count up to 14 and lactic acid level elevated at 2.0. ? ?Assessment and Plan: ?Assessment and Plan: ?* Acute on chronic systolic CHF (congestive heart failure) ([place]) ?Initially looked slightly dry.  With aggressive fluid resuscitation from sepsis, certainly now is in CHF as confirmed by BNP.  With sepsis stabilized, have stopped IV fluids and started IV Lasix.  She diuresed some mother still net positive.  However, with elevated lactic acid level, would favor stopping Lasix and restarting IV fluids. ? ?Sepsis with acute hypoxic respiratory failure without septic shock ([place]) ?Oxygenation improving.  Patient not on oxygen at home.  Sepsis looks to be stabilized.  White count had improved, but now has trended back upward with lactic acid level at 2.0.  Patient's antibiotics have been switched over, would favor rechecking CBC stat and  stopping Lasix and starting IV fluids.  Discussed with infectious disease and will stop Unasyn and go back to Rocephin and Flagyl. ? ?Acute anemia ?Baseline hemoglobin looks to be around 8.5-9.5 and came in with hemoglobin of 8.0, but then dropped to 6.8 within 14 hours.  Etiology unclear.  Does not look like pancytopenia.  Does not appear to be hemoconcentration.  She is 2 weeks out from chemotherapy.  Checking Hemoccult.  Patient received 1 unit packed red blood cells.  Following H&H closely.  Posttransfusion, hemoglobin up to 8.5 and remained stable. ? ?Community acquired pneumonia complicated by underlying lung cancer, apparent lung abscess on CT Chest  ?Given abscess, covering with broad spectrum antibiotics w/ MRSA coverage.  Clinically improving.  Infectious disease consulted.  For now, holding off on plans for aspiration or bronchoscopy and antibiotics only.  As above. ? ?Acute respiratory failure with hypoxia ([place]) ?Initially only required 2 L, but with fluid resuscitation, also in acute CHF and and was needing 4 L.  Should improve with IV Lasix.  We will work on weaning oxygen.  She is not on any oxygen at home.  Down to 1 L although with fluids, may need to increase oxygen support. ? 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  Last chemotherapy 2 weeks ago. ? ?Coronary artery disease s/p CABG ?continue statin, holding other meds as noted except lower dose beta blocker  ? ?Protein-calorie malnutrition, severe ?Nutrition Status: ?Nutrition Problem: Severe Malnutrition ?Etiology: chronic illness (stage 3 lung cancer) ?Signs/Symptoms: moderate muscle depletion, severe muscle  depletion, moderate fat depletion, severe fat depletion, energy intake < or equal to 75% for > or equal to 1 month, percent weight loss ?Interventions: Ensure Enlive (each supplement provides 350kcal and 20 grams of protein), MVI, Liberalize  Diet ? ? ? ? ?Essential hypertension ?Holding some antihypertensives due to hypotension from sepsis and blood loss.  Pressures improved with blood and fluids. ? ?HLD (hyperlipidemia) ?Continue statin  ? ?Hyponatremia ?Improved w/ NS, likely dehydration ?Repeat BMP in AM ? ?PAD (peripheral artery disease) ([place]) ?Continue statin  ? ?Hypomagnesemia ?Replacing as needed ? ?Pressure injury of skin ?Pressure Injury 03/22/21 Sacrum Mid Stage 2 -  Partial thickness loss of dermis presenting as a shallow open injury with a red, pink wound bed without slough. (Active)  ?03/22/21 1300  ?Location: Sacrum  ?Location Orientation: Mid  ?Staging: Stage 2 -  Partial thickness loss of dermis presenting as a shallow open injury with a red, pink wound bed without slough.  ?Wound Description (Comments):   ?Present on Admission: Yes  ? ?Stage II sacral decubitus, present on admission.  Wound care as directed. ? ? ? ? ?Sinus tachycardia ?See above re: A&P for CAP ?Tachycardic and soft BP ?Holding most home antihypertensives  ?Reducing dose home metoprolol - she has not taken meds this AM and rebound tachycardia may be concurrent w/ sepsis, will see if HR improves w/ BB but close monitor VS  ?Hx HFrEF, caution w/ IV fluids to avoid precipitation HF exacerbation  ? ? ? ? ? ? ?Body mass index is 18.4 kg/m?Marland Kitchen  ?Nutrition Problem: Severe Malnutrition ?Etiology: chronic illness (stage 3 lung cancer) ?Pressure Injury 03/22/21 Sacrum Mid Stage 2 -  Partial thickness loss of dermis presenting as a shallow open injury with a red, pink wound bed without slough. (Active)  ?03/22/21 1300  ?Location: Sacrum  ?Location Orientation: Mid  ?Staging: Stage 2 -  Partial thickness loss of dermis presenting as a shallow open injury with a red, pink wound bed without slough.  ?Wound Description (Comments):   ?Present on Admission: Yes  ?  ? ?Consultants: ?Infectious disease ?Pulmonary ? ?Procedures: ?none ? ?Antimicrobials: ?IV vancomycin 3/8-present ?IV  doxycycline 3/8-3/9 ?IV Flagyl 3/9-3/10, restarted 3/12 ?IV Rocephin 3/8-3/10, restarted 3/12 ?IV Unasyn 3/10-3/12 ? ?Code Status: Full code ? ? ?Subjective: Patient still quite tired although breathing little easier.  Some coughing ? ?Objective: ?Noted tachycardia earlier ?Vitals:  ? 03/25/21 0800 03/25/21 1131  ?BP: (!) 107/53 106/90  ?Pulse: 88 95  ?Resp: 16 19  ?Temp: 98.4 ?F (36.9 ?C) 98.3 ?F (36.8 ?C)  ?SpO2: 100% 100%  ? ? ?Intake/Output Summary (Last 24 hours) at 03/25/2021 1514 ?Last data filed at 03/25/2021 1006 ?Gross per 24 hour  ?Intake 1386.51 ml  ?Output 1100 ml  ?Net 286.51 ml  ? ? ?There were no vitals filed for this visit. ?Body mass index is 18.4 kg/m?. ? ?Exam: ? ?General: Alert and oriented x3, fatigued ?HEENT: Normocephalic, atraumatic, mucous membranes slightly dry ?Cardiovascular: Regular rate and rhythm, S1-S2 ?Respiratory: Poor inspiratory effort, decreased breath sounds throughout ?Abdomen: Soft, nontender, nondistended, positive bowel sounds ?Musculoskeletal: No clubbing or cyanosis, trace pitting edema ?Skin: No skin breaks, tears or lesions.  Exception stage II sacral decub ?Psychiatry: Appropriate, no evidence of psychoses ?Neurology: No focal deficits ? ?Data Reviewed: ?Lactic acid level of 2.0, white blood cell count of 14 ? ?Disposition:  ?Status is: Inpatient ?Remains inpatient appropriate because: Stabilization of infection ?  ? ?Family Communication: Daughter and granddaughter at the bedside ?DVT Prophylaxis: ?  enoxaparin (LOVENOX) injection 30 mg Start: 03/21/21 2200 ? ? ? ?Author: ?Annita Brod ,MD ?03/25/2021 3:14 PM ? ?To reach On-call, see care teams to locate the attending and reach out via [email or web address]. ?Between 7PM-7AM, please contact night-coverage ?If you still have difficulty reaching the attending provider, please page the [facility] (Director on Call) for [facility] on amion for assistance. ? ?

## 2021-03-25 NOTE — Evaluation (Signed)
Physical Therapy Evaluation ?Patient Details ?Name: [name] ?MRN: [id number] ?DOB: 1940/11/30 ?Today's Date: 03/25/2021 ? ?History of Present Illness ? Patient is a 81 year old female with acute on chronic systolic CHF, sepsis with acute hypoxic respiratory failure without septic shock, community acquired pneumonia complicated by underlying lung cancer. 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  ?Clinical Impression ? Patient seen for PT evaluation. Supportive family was at the bedside, daughter and granddaughter. The patient needed encouragement to participate with mobility. Increased time and effort required for activity. Patient sat up for several minutes on the side of the bed but declined standing or out of bed activity at this time. Educated patient and family on importance of routine upright activity to build strength and for upright conditioning. Sp02 93-94% on room air while seated. Replaced supplemental 02 when patient returned back to bed. Anticipate the patient will be able to return home with intermittent assistance from family and home health PT. Recommend to continue PT while in the hospital to maximize independence and facilitate return to prior level of function as patient was independent and lives alone prior to admission.  ?   ? ?Recommendations for follow up therapy are one component of a multi-disciplinary discharge planning process, led by the attending physician.  Recommendations may be updated based on patient status, additional functional criteria and insurance authorization. ? ?Follow Up Recommendations Home health PT ? ?  ?Assistance Recommended at Discharge Intermittent Supervision/Assistance  ?Patient can return home with the following ? A little help with walking and/or transfers;A little help with bathing/dressing/bathroom;Assist for transportation;Help with stairs or ramp for entrance ? ?  ?Equipment Recommendations None  recommended by PT  ?Recommendations for Other Services ?    ?  ?Functional Status Assessment Patient has had a recent decline in their functional status and demonstrates the ability to make significant improvements in function in a reasonable and predictable amount of time.  ? ?  ?Precautions / Restrictions Precautions ?Precautions: Fall ?Restrictions ?Weight Bearing Restrictions: No  ? ?  ? ?Mobility ? Bed Mobility ?Overal bed mobility: Needs Assistance ?Bed Mobility: Supine to Sit, Sit to Supine ?  ?  ?Supine to sit: Supervision ?Sit to supine: Min guard ?  ?General bed mobility comments: increased time and effort required with no physical assistance needed. cues for technique/sequencing ?  ? ?Transfers ?  ?  ?  ?  ?  ?  ?  ?  ?  ?General transfer comment: patient refused to stand at this time due to fatigue and wanting to go to sleep ?  ? ?Ambulation/Gait ?  ?  ?  ?  ?  ?  ?  ?  ? ?Stairs ?  ?  ?  ?  ?  ? ?Wheelchair Mobility ?  ? ?Modified Rankin (Stroke Patients Only) ?  ? ?  ? ?Balance Overall balance assessment: Needs assistance ?Sitting-balance support: Feet unsupported, Bilateral upper extremity supported ?Sitting balance-Leahy Scale: Good ?Sitting balance - Comments: flexed posture in sitting. no loss of balance. monitored patient's response to upright sitting activity. Sp02 93-94% on room air with sitting upright. no dizziness is reported with activity ?  ?  ?  ?  ?  ?  ?  ?  ?  ?  ?  ?  ?  ?  ?  ?   ? ? ? ?Pertinent Vitals/Pain Pain Assessment ?Pain Assessment: No/denies pain  ? ? ?Home Living Family/patient  expects to be discharged to:: Private residence ?Living Arrangements: Alone ?Available Help at Discharge: Family;Available PRN/intermittently ?Type of Home: House ?Home Access: Stairs to enter ?  ?Entrance Stairs-Number of Steps: 3 ?  ?Home Layout: Able to live on main level with bedroom/bathroom;Two level ?Home Equipment: Rolling [place] (2 wheels);BSC/3in1;Cane - single point ?Additional Comments:  she has DME but has not needed to use it in the past  ?  ?Prior Function Prior Level of Function : Independent/Modified Independent;Driving ?  ?  ?  ?  ?  ?  ?Mobility Comments: independent ?ADLs Comments: independent ?  ? ? ?Hand Dominance  ?   ? ?  ?Extremity/Trunk Assessment  ? Upper Extremity Assessment ?Upper Extremity Assessment: Generalized weakness ?  ? ?Lower Extremity Assessment ?Lower Extremity Assessment: Generalized weakness ?  ? ?   ?Communication  ? Communication: No difficulties  ?Cognition Arousal/Alertness: Awake/alert ?Behavior During Therapy: [facility] for tasks assessed/performed ?Overall Cognitive Status: Within Functional Limits for tasks assessed ?  ?  ?  ?  ?  ?  ?  ?  ?  ?  ?  ?  ?  ?  ?  ?  ?General Comments: patient is able to follow all commands without difficulty. needs encourgaement to participate ?  ?  ? ?  ?General Comments General comments (skin integrity, edema, etc.): patient appears fatigued with limited endurance overall. she declined out of bed activity, however educated family on the importance of routine out of bed activity and sitting for upright conditioning. educated the family about repositioning frequency to prevent against skin breakdown and demonstrated this in the chair. ? ?  ?Exercises    ? ?Assessment/Plan  ?  ?PT Assessment Patient needs continued PT services  ?PT Problem List Decreased strength;Decreased range of motion;Decreased activity tolerance;Decreased balance;Decreased mobility ? ?   ?  ?PT Treatment Interventions DME instruction;Gait training;Stair training;Therapeutic activities;Functional mobility training;Therapeutic exercise;Balance training;Neuromuscular re-education;Cognitive remediation;Patient/family education   ? ?PT Goals (Current goals can be found in the Care Plan section)  ?Acute Rehab PT Goals ?Patient Stated Goal: to get some rest, return home ?PT Goal Formulation: With patient/family ?Time For Goal Achievement: 04/08/21 ?Potential to Achieve  Goals: Good ? ?  ?Frequency Min 2X/week ?  ? ? ?Co-evaluation   ?  ?  ?  ?  ? ? ?  ?AM-PAC PT "6 Clicks" Mobility  ?Outcome Measure Help needed turning from your back to your side while in a flat bed without using bedrails?: A Little ?Help needed moving from lying on your back to sitting on the side of a flat bed without using bedrails?: A Little ?Help needed moving to and from a bed to a chair (including a wheelchair)?: A Little ?Help needed standing up from a chair using your arms (e.g., wheelchair or bedside chair)?: A Little ?Help needed to walk in hospital room?: A Lot ?Help needed climbing 3-5 steps with a railing? : A Lot ?6 Click Score: 16 ? ?  ?End of Session   ?Activity Tolerance: Patient limited by fatigue ?Patient left: in bed;with call bell/phone within reach;with bed alarm set;with family/visitor present (daughter and granddaughter present throughout session) ?Nurse Communication: Mobility status ?PT Visit Diagnosis: Muscle weakness (generalized) (M62.81);Unsteadiness on feet (R26.81) ?  ? ?Time: 6073-7106 ?PT Time Calculation (min) (ACUTE ONLY): 31 min ? ? ?Charges:   PT Evaluation ?$PT Eval Low Complexity: 1 Low ?PT Treatments ?$Therapeutic Activity: 8-22 mins ?  ?   ? ?Minna Merritts, PT, MPT ? ?Percell Locus ?03/25/2021,  1:33 PM ? ?

## 2021-03-26 DIAGNOSIS — J984 Other disorders of lung: Secondary | ICD-10-CM

## 2021-03-26 DIAGNOSIS — J9601 Acute respiratory failure with hypoxia: Secondary | ICD-10-CM | POA: Diagnosis not present

## 2021-03-26 DIAGNOSIS — J189 Pneumonia, unspecified organism: Secondary | ICD-10-CM | POA: Diagnosis not present

## 2021-03-26 DIAGNOSIS — I5023 Acute on chronic systolic (congestive) heart failure: Secondary | ICD-10-CM | POA: Diagnosis not present

## 2021-03-26 DIAGNOSIS — Z902 Acquired absence of lung [part of]: Secondary | ICD-10-CM | POA: Diagnosis not present

## 2021-03-26 LAB — CBC
HCT: 25.8 % — ABNORMAL LOW (ref 36.0–46.0)
Hemoglobin: 8.4 g/dL — ABNORMAL LOW (ref 12.0–15.0)
MCH: 30.7 pg (ref 26.0–34.0)
MCHC: 32.6 g/dL (ref 30.0–36.0)
MCV: 94.2 fL (ref 80.0–100.0)
Platelets: 148 10*3/uL — ABNORMAL LOW (ref 150–400)
RBC: 2.74 MIL/uL — ABNORMAL LOW (ref 3.87–5.11)
RDW: 15.6 % — ABNORMAL HIGH (ref 11.5–15.5)
WBC: 13.2 10*3/uL — ABNORMAL HIGH (ref 4.0–10.5)
nRBC: 0 % (ref 0.0–0.2)

## 2021-03-26 LAB — BASIC METABOLIC PANEL
Anion gap: 8 (ref 5–15)
BUN: 13 mg/dL (ref 8–23)
CO2: 25 mmol/L (ref 22–32)
Calcium: 7.5 mg/dL — ABNORMAL LOW (ref 8.9–10.3)
Chloride: 99 mmol/L (ref 98–111)
Creatinine, Ser: 0.86 mg/dL (ref 0.44–1.00)
GFR, Estimated: >60 mL/min (ref >60)
Glucose, Bld: 102 mg/dL — ABNORMAL HIGH (ref 70–99)
Potassium: 4.2 mmol/L (ref 3.5–5.1)
Sodium: 132 mmol/L — ABNORMAL LOW (ref 135–145)

## 2021-03-26 LAB — CULTURE, BLOOD (ROUTINE X 2)
Culture: NO GROWTH
Culture: NO GROWTH
Special Requests: ADEQUATE
Special Requests: ADEQUATE

## 2021-03-26 LAB — EXPECTORATED SPUTUM ASSESSMENT W GRAM STAIN, RFLX TO RESP C

## 2021-03-26 LAB — MAGNESIUM: Magnesium: 1.1 mg/dL — ABNORMAL LOW (ref 1.7–2.4)

## 2021-03-26 LAB — LACTIC ACID, PLASMA: Lactic Acid, Venous: 0.9 mmol/L (ref 0.5–1.9)

## 2021-03-26 LAB — BRAIN NATRIURETIC PEPTIDE: B Natriuretic Peptide: 611.3 pg/mL — ABNORMAL HIGH (ref 0.0–100.0)

## 2021-03-26 MED ORDER — METRONIDAZOLE 500 MG PO TABS
500.0000 mg | ORAL_TABLET | Freq: Two times a day (BID) | ORAL | Status: DC
  Administered 2021-03-26 – 2021-03-28 (×4): 500 mg via ORAL
  Filled 2021-03-26 (×4): qty 1

## 2021-03-26 MED ORDER — SODIUM CHLORIDE 0.9 % IV SOLN: INTRAVENOUS | Status: DC | PRN

## 2021-03-26 MED ORDER — SODIUM CHLORIDE 0.9% FLUSH
10.0000 mL | Freq: Two times a day (BID) | INTRAVENOUS | Status: DC
  Administered 2021-03-26 – 2021-03-28 (×4): 10 mL

## 2021-03-26 MED ORDER — FUROSEMIDE 10 MG/ML IJ SOLN
20.0000 mg | Freq: Two times a day (BID) | INTRAMUSCULAR | Status: DC
  Administered 2021-03-26 – 2021-03-27 (×3): 20 mg via INTRAVENOUS
  Filled 2021-03-26 (×3): qty 2

## 2021-03-26 MED ORDER — SODIUM CHLORIDE 0.9% FLUSH: 10.0000 mL | INTRAVENOUS | Status: DC | PRN

## 2021-03-26 MED ORDER — MAGNESIUM SULFATE 2 GM/50ML IV SOLN
2.0000 g | Freq: Once | INTRAVENOUS | Status: AC
  Administered 2021-03-26: 2 g via INTRAVENOUS
  Filled 2021-03-26: qty 50

## 2021-03-26 NOTE — Care Management Important Message (Signed)
Important Message ? ?Patient Details  ?Name: [name] ?MRN: [id number] ?Date of Birth: Dec 19, 1940 ? ? ?Medicare Important Message Given:  Yes ? ? ? ? ?Dannette Barbara ?03/26/2021, 11:38 AM ?

## 2021-03-26 NOTE — TOC Initial Note (Signed)
Transition of Care (TOC) - Initial/Assessment Note  ? ? ?Patient Details  ?Name: [name] ?MRN: [id number] ?Date of Birth: [birth year] ? ?Transition of Care (TOC) CM/SW Contact:    ?Candie Chroman, LCSW ?Phone Number: ?03/26/2021, 2:27 PM ? ?Clinical Narrative:  Readmission prevention screen complete. CSW met with patient. Granddaughter at bedside. CSW introduced role and explained that discharge planning would be discussed. Patient lives home alone. Daughter lives next door, grandson lives behind her, and other family members within 5 minutes away. PCP is Derinda Late, MD at [facility] in [place]. Family transports to appointments. Pharmacy is [facility] on [address]. No issues obtaining medications. No home health prior to admission but she is agreeable. They will review CMS list and notify CSW of preference. Patient has a cane, Taney, wheelchair, bedside commode, and shower stool at home. She is not on oxygen at home but currently on acute oxygen. She is hoping to qualify for home oxygen so she will have it if she needs it. No further concerns. CSW encouraged patient and her granddaughter to contact CSW as needed. CSW will continue to follow patient and her family for support and facilitate return home when stable.              ? ?Expected Discharge Plan: [place] ?Barriers to Discharge: Continued Medical Work up ? ? ?Patient Goals and CMS Choice ?  ?CMS Medicare.gov Compare Post Acute Care list provided to:: Patient (Granddaughter at bedside.) ?  ? ?Expected Discharge Plan and Services ?Expected Discharge Plan: [place] ?  ?  ?Post Acute Care Choice: Durable Medical Equipment, Home Health ?Living arrangements for the past 2 months: [place] ?                ?  ?  ?  ?  ?  ?  ?  ?  ?  ?  ? ?Prior Living Arrangements/Services ?Living arrangements for the past 2 months: [place] ?Lives with:: Self ?Patient language and need for interpreter reviewed::  Yes ?Do you feel safe going back to the place where you live?: Yes      ?Need for Family Participation in Patient Care: Yes (Comment) ?Care giver support system in place?: Yes (comment) ?Current home services: DME ?Criminal Activity/Legal Involvement Pertinent to Current Situation/Hospitalization: No - Comment as needed ? ?Activities of Daily Living ?Home Assistive Devices/Equipment: Eyeglasses, Dentures (specify type) ?ADL Screening (condition at time of admission) ?Patient's cognitive ability adequate to safely complete daily activities?: Yes ?Is the patient deaf or have difficulty hearing?: No ?Does the patient have difficulty seeing, even when wearing glasses/contacts?: No ?Does the patient have difficulty concentrating, remembering, or making decisions?: No ?Patient able to express need for assistance with ADLs?: Yes ?Does the patient have difficulty dressing or bathing?: No ?Independently performs ADLs?: Yes (appropriate for developmental age) ?Does the patient have difficulty walking or climbing stairs?: No ?Weakness of Legs: None ?Weakness of Arms/Hands: None ? ?Permission Sought/Granted ?Permission sought to share information with : Customer service manager ?Permission granted to share information with : Yes, Verbal Permission Granted ? Share Information with NAME: Kaitlin Reckert ?   ? Permission granted to share info w Relationship: Granddaughter ?   ? ?Emotional Assessment ?Appearance:: Appears stated age ?Attitude/Demeanor/Rapport: Engaged, Gracious ?Affect (typically observed): Accepting, Appropriate, Calm, Pleasant ?Orientation: : Oriented to Self, Oriented to Place, Oriented to  Time, Oriented to Situation ?Alcohol / Substance Use: Not Applicable ?Psych Involvement: No (  comment) ? ?Admission diagnosis:  Pneumonia [J18.9] ?HCAP (healthcare-associated pneumonia) [J18.9] ?Community acquired pneumonia of left lung, unspecified part of lung [J18.9] ?Sepsis with acute hypoxic respiratory failure  without septic shock, due to unspecified organism (HCC) [A41.9, R65.20, J96.01] ?Patient Active Problem List  ? Diagnosis Date Noted  ? Hypomagnesemia 03/24/2021  ? Protein-calorie malnutrition, severe 03/23/2021  ? Pressure injury of skin 03/22/2021  ? Acute anemia 03/22/2021  ? Community acquired pneumonia complicated by underlying lung cancer, apparent lung abscess on CT Chest  03/21/2021  ? Acute on chronic systolic CHF (congestive heart failure) (HCC) 03/21/2021  ? Sinus tachycardia 03/21/2021  ? Sepsis with acute hypoxic respiratory failure without septic shock (HCC) 03/21/2021  ? Hyponatremia 03/21/2021  ? 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 S/P partial lobectomy of lung 11/13/2020  ? Unstable angina (HCC)   ? COVID-19 virus infection   ? Acute on chronic HFrEF (heart failure with reduced ejection fraction) (HCC) 07/24/2020  ? Coronary artery disease s/p CABG 07/24/2020  ? Acute respiratory failure with hypoxia (HCC) 07/24/2020  ? Acute exacerbation of CHF (congestive heart failure) (HCC) 07/24/2020  ? 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 05/30/2020  ? 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  ? NSTEMI (non-ST elevated myocardial infarction) (HCC) 04/30/2020  ? Elevated troponin 04/30/2020  ? History of left breast cancer 11/25/2019  ? Hyperlipidemia 11/25/2019  ? Carotid stenosis 08/24/2017  ? 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  ? Pain 01/22/2016  ? 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 PAD (peripheral artery disease) (HCC) 11/22/2014  ? H/O malignant neoplasm of breast 11/22/2014  ? HLD (hyperlipidemia) 11/22/2014  ? Essential hypertension 11/22/2014  ? 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 ?PCP:  Babaoff, Marcus, MD ?Pharmacy:   ?[id number] - [place], [place] - [address] ?[address] ?[place] [address] ?Phone: [phone] Fax: [phone] ? ? ? ? ?Social Determinants of Health (SDOH) Interventions ?  ? ?Readmission Risk  Interventions ?Readmission Risk Prevention Plan 03/26/2021 05/09/2020  ?Post Dischage Appt - Complete  ?Medication Screening - Complete  ?Transportation Screening Complete Complete  ?Medication Review (RN Care Manager) Complete -  ?PCP or Specialist appointment within 3-5 days of discharge Complete -  ?SW Recovery Care/Counseling Consult Complete -  ?Palliative Care Screening Not Applicable -  ?Skilled Nursing Facility Not Applicable -  ?Some recent data might be hidden  ? ? ? ?

## 2021-03-26 NOTE — Progress Notes (Signed)
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  In emergency room, patient found to have sepsis from multilobar pneumonia and possible lung abscess causing acute respiratory failure with hypoxia.  Admitted to the hospitalist service and started on IV fluids and antibiotics.  Infectious disease consulted recommending antibiotic course and hopefully to avoid bronchoscopy/aspiration of abscess.  By 3/10, clinically improving.  BNP elevated at 1100.  IV fluids stopped and patient started on IV Lasix with some good diuresis.  Antibiotics attempted to be simplified on the evening of 3/10 which led to increasing white blood cell count and lactic acid level by 3/12.  On night of 3/12, IV fluids restarted, Lasix stopped and antibiotics changed back to original regimen after discussion with infectious disease.  By 3/13, white blood cell count and lactic acid level had normalized.  Fluids stopped and Lasix restarted.  Assessment and Plan: Assessment and Plan: * Acute on chronic systolic CHF (congestive heart failure) ([place]) Initially looked slightly dry.  With aggressive fluid resuscitation from sepsis, certainly now is in CHF as confirmed by BNP.  With sepsis stabilized, initially stop IV fluids and started Lasix.  Had to reverse course on 3/12, but now with sepsis restabilized, Lasix restarted and fluids stopped.    Sepsis with acute hypoxic respiratory failure without septic shock (HCC) Oxygenation improving.  Patient not on oxygen at home.  Sepsis initially stabilized, but  attempts to stop Flagyl/Rocephin and start Unasyn led to worsening white count and increasing lactic acid on 3/12 so antibiotics changed back to original regimen that evening plus IV fluids and Lasix stopped.  By 3/13, patient doing better and with lactic acid level and white blood cell count normalizing, fluids stopped, Lasix restarted with plans to keep patient on original regimen of vancomycin, Flagyl and Rocephin.    Acute anemia Baseline hemoglobin looks to be around 8.5-9.5 and came in with hemoglobin of 8.0, but then dropped to 6.8 within 14 hours.  Etiology unclear.  Does not look like pancytopenia.  Does not appear to be hemoconcentration.  She is 2 weeks out from chemotherapy.  Checking Hemoccult.  Patient received 1 unit packed red blood cells.  Following H&H closely.  Posttransfusion, hemoglobin up to 8.5 and remained stable.  Community acquired pneumonia complicated by underlying lung cancer, apparent lung abscess on CT Chest  Given abscess, covering with broad spectrum antibiotics w/ MRSA coverage.  Clinically improving.  Infectious disease consulted.  For now, holding off on plans for aspiration or bronchoscopy and antibiotics only.  As above.  Acute respiratory failure with hypoxia (HCC) Initially only required 2 L, but with fluid resuscitation, also in acute CHF and and was needing 4 L.  Should improve with IV Lasix.  We will work on weaning oxygen.  She is not on any oxygen at home.  Down to 1 L although with fluids, may need to increase oxygen support.  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  Last chemotherapy 2 weeks  ago.  Coronary artery disease s/p CABG continue statin, holding other meds as noted except lower dose beta blocker   Protein-calorie malnutrition, severe Nutrition Status: Nutrition Problem: Severe Malnutrition Etiology: chronic illness (stage 3 lung cancer) Signs/Symptoms: moderate  muscle depletion, severe muscle depletion, moderate fat depletion, severe fat depletion, energy intake < or equal to 75% for > or equal to 1 month, percent weight loss Interventions: Ensure Enlive (each supplement provides 350kcal and 20 grams of protein), MVI, Liberalize Diet     Essential hypertension Holding some antihypertensives due to hypotension from sepsis and blood loss.  Pressures improved with blood and fluids.  HLD (hyperlipidemia) Continue statin   Hyponatremia Improved w/ NS, likely dehydration Repeat BMP in AM  PAD (peripheral artery disease) (HCC) Continue statin   Hypomagnesemia Replacing as needed  Pressure injury of skin Pressure Injury 03/22/21 Sacrum Mid Stage 2 -  Partial thickness loss of dermis presenting as a shallow open injury with a red, pink wound bed without slough. (Active)  03/22/21 1300  Location: Sacrum  Location Orientation: Mid  Staging: Stage 2 -  Partial thickness loss of dermis presenting as a shallow open injury with a red, pink wound bed without slough.  Wound Description (Comments):   Present on Admission: Yes   Stage II sacral decubitus, present on admission.  Wound care as directed.     Sinus tachycardia See above re: A&P for CAP Tachycardic and soft BP Holding most home antihypertensives  Reducing dose home metoprolol - she has not taken meds this AM and rebound tachycardia may be concurrent w/ sepsis, will see if HR improves w/ BB but close monitor VS  Hx HFrEF, caution w/ IV fluids to avoid precipitation HF exacerbation        Body mass index is 18.38 kg/m.  Nutrition Problem: Severe Malnutrition Etiology: chronic illness (stage 3 lung cancer) Pressure Injury 03/22/21 Sacrum Mid Stage 2 -  Partial thickness loss of dermis presenting as a shallow open injury with a red, pink wound bed without slough. (Active)  03/22/21 1300  Location: Sacrum  Location Orientation: Mid  Staging: Stage 2 -  Partial thickness loss of  dermis presenting as a shallow open injury with a red, pink wound bed without slough.  Wound Description (Comments):   Present on Admission: Yes     Consultants: Infectious disease Pulmonary  Procedures: none  Antimicrobials: IV vancomycin 3/8-present IV doxycycline 3/8-3/9 IV Flagyl 3/9-3/10, restarted 3/12 IV Rocephin 3/8-3/10, restarted 3/12 IV Unasyn 3/10-3/12  Code Status: Full code   Subjective: Patient complains of some productive/nonproductive coughing.  Not much appetite. Objective: Tachycardia improved Vitals:   03/26/21 0737 03/26/21 1111  BP: 107/89 (!) 119/55  Pulse: 78 94  Resp: 17 17  Temp: 97.8 F (36.6 C) 97.7 F (36.5 C)  SpO2: 100% 100%    Intake/Output Summary (Last 24 hours) at 03/26/2021 1438 Last data filed at 03/26/2021 1015 Gross per 24 hour  Intake 670.13 ml  Output 900 ml  Net -229.87 ml    Filed Weights   03/26/21 1039  Weight: 42.7 kg   Body mass index is 18.38 kg/m.  Exam:  General: Alert and oriented x3, less fatigued today HEENT: Normocephalic, atraumatic, mucous membranes slightly dry Cardiovascular: Regular rate and rhythm, S1-S2 Respiratory: Poor inspiratory effort, decreased breath sounds throughout, few rhonchi Abdomen: Soft, nontender, nondistended, positive bowel sounds Musculoskeletal: No clubbing or cyanosis, trace pitting edema Skin: No skin breaks, tears or lesions.  Exception stage II sacral decub Psychiatry: Appropriate,  no evidence of psychoses Neurology: No focal deficits  Data Reviewed: White blood cell count down to 13.2 from 15.8.  Hemoglobin stable.  BNP at 611.  Lactic acid level normal at 0.9, down from 2.0.  Disposition:  Status is: Inpatient Remains inpatient appropriate because: Stabilization of infection.  Plan be discharged home with home health, likely in a few days once white count normal and fully diuresed    Family Communication: Daughter at the bedside. DVT Prophylaxis: enoxaparin  (LOVENOX) injection 30 mg Start: 03/21/21 2200    Author: Annita Brod ,MD 03/26/2021 2:38 PM  To reach On-call, see care teams to locate the attending and reach out via [email or web address]. Between 7PM-7AM, please contact night-coverage If you still have difficulty reaching the attending provider, please page the [facility] (Director on Call) for [facility] on amion for assistance.

## 2021-03-26 NOTE — Progress Notes (Signed)
Subjective: No new complaints   Antibiotics:  Anti-infectives (From admission, onward)    Start     Dose/Rate Route Frequency Ordered Stop   03/25/21 2000  metroNIDAZOLE (FLAGYL) IVPB 500 mg        500 mg 100 mL/hr over 60 Minutes Intravenous Every 12 hours 03/25/21 1513     03/25/21 1800  cefTRIAXone (ROCEPHIN) 2 g in sodium chloride 0.9 % 100 mL IVPB        2 g 200 mL/hr over 30 Minutes Intravenous Every 24 hours 03/25/21 1513     03/24/21 0824  vancomycin variable dose per unstable renal function (pharmacist dosing)  Status:  Discontinued         Does not apply See admin instructions 03/24/21 0824 03/24/21 0842   03/23/21 1800  Ampicillin-Sulbactam (UNASYN) 3 g in sodium chloride 0.9 % 100 mL IVPB  Status:  Discontinued        3 g 200 mL/hr over 30 Minutes Intravenous Every 6 hours 03/23/21 1639 03/25/21 1513   03/22/21 1800  vancomycin (VANCOREADY) IVPB 500 mg/100 mL  Status:  Discontinued        500 mg 100 mL/hr over 60 Minutes Intravenous Every 24 hours 03/21/21 1610 03/26/21 1553   03/22/21 1800  metroNIDAZOLE (FLAGYL) IVPB 500 mg  Status:  Discontinued        500 mg 100 mL/hr over 60 Minutes Intravenous Every 12 hours 03/22/21 1617 03/23/21 1639   03/22/21 1000  cefTRIAXone (ROCEPHIN) 2 g in sodium chloride 0.9 % 100 mL IVPB  Status:  Discontinued        2 g 200 mL/hr over 30 Minutes Intravenous Every 24 hours 03/21/21 1525 03/23/21 1639   03/22/21 0400  doxycycline (VIBRAMYCIN) 100 mg in sodium chloride 0.9 % 250 mL IVPB  Status:  Discontinued        100 mg 125 mL/hr over 120 Minutes Intravenous Every 12 hours 03/21/21 1525 03/22/21 1617   03/21/21 1545  vancomycin (VANCOCIN) IVPB 1000 mg/200 mL premix        1,000 mg 200 mL/hr over 60 Minutes Intravenous  Once 03/21/21 1537 03/21/21 2049   03/21/21 1345  cefTRIAXone (ROCEPHIN) 2 g in sodium chloride 0.9 % 100 mL IVPB  Status:  Discontinued        2 g 200 mL/hr over 30 Minutes Intravenous Every 24 hours  03/21/21 1336 03/21/21 1535   03/21/21 1345  doxycycline (VIBRAMYCIN) 100 mg in sodium chloride 0.9 % 250 mL IVPB        100 mg 125 mL/hr over 120 Minutes Intravenous STAT 03/21/21 1336 03/21/21 1700   03/21/21 1330  levofloxacin (LEVAQUIN) IVPB 750 mg  Status:  Discontinued        750 mg 100 mL/hr over 90 Minutes Intravenous  Once 03/21/21 1323 03/21/21 1335       Medications: Scheduled Meds:  atorvastatin  20 mg Oral Daily   calcium-vitamin D  1 tablet Oral Daily   Chlorhexidine Gluconate Cloth  6 each Topical Daily   enoxaparin (LOVENOX) injection  30 mg Subcutaneous Q24H   feeding supplement  237 mL Oral TID BM   furosemide  20 mg Intravenous BID   magnesium oxide  400 mg Oral Daily   metoprolol succinate  12.5 mg Oral Daily   metoprolol tartrate  5 mg Intravenous Q6H   multivitamin with minerals  1 tablet Oral Daily   potassium chloride  20 mEq Oral BID  Continuous Infusions:  cefTRIAXone (ROCEPHIN)  IV 2 g (03/26/21 1708)   metronidazole 100 mL/hr at 03/26/21 0900   PRN Meds:.chlorpheniramine-HYDROcodone, ipratropium-albuterol, oxyCODONE    Objective: Weight change:   Intake/Output Summary (Last 24 hours) at 03/26/2021 1739 Last data filed at 03/26/2021 1607 Gross per 24 hour  Intake 1305.6 ml  Output 900 ml  Net 405.6 ml   Blood pressure 119/71, pulse 82, temperature 97.8 F (36.6 C), resp. rate 19, height 5' (1.524 m), weight 42.7 kg, SpO2 100 %. Temp:  [97.7 F (36.5 C)-99.4 F (37.4 C)] 97.8 F (36.6 C) (03/13 1551) Pulse Rate:  [78-109] 82 (03/13 1551) Resp:  [17-20] 19 (03/13 1551) BP: (102-124)/(45-89) 119/71 (03/13 1551) SpO2:  [94 %-100 %] 100 % (03/13 1111) Weight:  [42.7 kg] 42.7 kg (03/13 1039)  Physical Exam: Physical Exam Constitutional:      Appearance: She is cachectic. She is ill-appearing.  Eyes:     Extraocular Movements: Extraocular movements intact.  Cardiovascular:     Rate and Rhythm: Tachycardia present.  Pulmonary:      Effort: Pulmonary effort is normal. No respiratory distress.     Breath sounds: No wheezing.  Abdominal:     General: There is no distension.  Neurological:     General: No focal deficit present.     Mental Status: She is alert and oriented to person, place, and time.  Psychiatric:        Mood and Affect: Mood normal.        Behavior: Behavior normal.        Thought Content: Thought content normal.        Judgment: Judgment normal.     CBC:    BMET Recent Labs    03/25/21 0440 03/26/21 0453  NA 133* 132*  K 4.5 4.2  CL 98 99  CO2 27 25  GLUCOSE 126* 102*  BUN 13 13  CREATININE 0.98 0.86  CALCIUM 7.6* 7.5*     Liver Panel  Recent Labs    03/24/21 0428 03/25/21 0440  PROT 5.8* 6.2*  ALBUMIN 1.9* 2.0*  AST 17 19  ALT 12 12  ALKPHOS 64 62  BILITOT 0.6 0.5       Sedimentation Rate No results for input(s): ESRSEDRATE in the last 72 hours. C-Reactive Protein No results for input(s): CRP in the last 72 hours.  Micro Results: Recent Results (from the past 720 hour(s))  Culture, blood (Routine x 2)     Status: None   Collection Time: 03/21/21 10:18 AM   Specimen: BLOOD  Result Value Ref Range Status   Specimen Description BLOOD PORT  Final   Special Requests   Final    BOTTLES DRAWN AEROBIC AND ANAEROBIC Blood Culture adequate volume   Culture   Final    NO GROWTH 5 DAYS Performed at [facility], [address], [place],  [address]    Report Status 03/26/2021 FINAL  Final  Culture, blood (Routine x 2)     Status: None   Collection Time: 03/21/21 10:19 AM   Specimen: BLOOD  Result Value Ref Range Status   Specimen Description BLOOD RIGHT HAND  Final   Special Requests   Final    BOTTLES DRAWN AEROBIC AND ANAEROBIC Blood Culture adequate volume   Culture   Final    NO GROWTH 5 DAYS Performed at [facility], [address], [place],  [address]    Report Status 03/26/2021 FINAL  Final  Resp Panel by  RT-PCR (Flu  A&B, Covid) Nasopharyngeal Swab     Status: None   Collection Time: 03/21/21 10:32 AM   Specimen: Nasopharyngeal Swab; Nasopharyngeal(NP) swabs in vial transport medium  Result Value Ref Range Status   SARS Coronavirus 2 by RT PCR NEGATIVE NEGATIVE Final    Comment: (NOTE) SARS-CoV-2 target nucleic acids are NOT DETECTED.  The SARS-CoV-2 RNA is generally detectable in upper respiratory specimens during the acute phase of infection. The lowest concentration of SARS-CoV-2 viral copies this assay can detect is 138 copies/mL. A negative result does not preclude SARS-Cov-2 infection and should not be used as the sole basis for treatment or other patient management decisions. A negative result may occur with  improper specimen collection/handling, submission of specimen other than nasopharyngeal swab, presence of viral mutation(s) within the areas targeted by this assay, and inadequate number of viral copies(<138 copies/mL). A negative result must be combined with clinical observations, patient history, and epidemiological information. The expected result is Negative.  Fact Sheet for Patients:  EntrepreneurPulse.com.au  Fact Sheet for Healthcare Providers:  IncredibleEmployment.be  This test is no t yet approved or cleared by the [place] FDA and  has been authorized for detection and/or diagnosis of SARS-CoV-2 by FDA under an Emergency Use Authorization (EUA). This EUA will remain  in effect (meaning this test can be used) for the duration of the COVID-19 declaration under Section 564(b)(1) of the Act, 21 U.S.C.section 360bbb-3(b)(1), unless the authorization is terminated  or revoked sooner.       Influenza A by PCR NEGATIVE NEGATIVE Final   Influenza B by PCR NEGATIVE NEGATIVE Final    Comment: (NOTE) The Xpert Xpress SARS-CoV-2/FLU/RSV plus assay is intended as an aid in the diagnosis of influenza from Nasopharyngeal swab specimens  and should not be used as a sole basis for treatment. Nasal washings and aspirates are unacceptable for Xpert Xpress SARS-CoV-2/FLU/RSV testing.  Fact Sheet for Patients: EntrepreneurPulse.com.au  Fact Sheet for Healthcare Providers: IncredibleEmployment.be  This test is not yet approved or cleared by the [place] FDA and has been authorized for detection and/or diagnosis of SARS-CoV-2 by FDA under an Emergency Use Authorization (EUA). This EUA will remain in effect (meaning this test can be used) for the duration of the COVID-19 declaration under Section 564(b)(1) of the Act, 21 U.S.C. section 360bbb-3(b)(1), unless the authorization is terminated or revoked.  Performed at [facility], [address], [place], [place] [address]   Urine Culture     Status: Abnormal   Collection Time: 03/21/21  9:25 PM   Specimen: Urine, Random  Result Value Ref Range Status   Specimen Description   Final    URINE, RANDOM Performed at [facility], [address] [place], [place] [address]    Special Requests   Final    NONE Performed at [facility], [address] [place], [place] [address]    Culture (A)  Final    <10,000 COLONIES/mL INSIGNIFICANT GROWTH Performed at [facility], [address] [place], [place] [address]    Report Status 03/23/2021 FINAL  Final  MRSA Next Gen by PCR, Nasal     Status: None   Collection Time: 03/22/21 12:32 PM   Specimen: Nasal Mucosa; Nasal Swab  Result Value Ref Range Status   MRSA by PCR Next Gen NOT DETECTED NOT DETECTED Final    Comment: (NOTE) The GeneXpert MRSA Assay (FDA approved for NASAL specimens only), is one component of a comprehensive MRSA  colonization surveillance program. It is not intended to diagnose MRSA infection nor to guide or monitor treatment for MRSA infections. Test performance is not FDA approved in patients less than 46 years old. Performed  at [facility], [address], [place], [place] [address]     Studies/Results: No results found.    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 female with history of lung cancer status post lobectomy now admitted with acute on chronic respiratory failure with cavitary pneumonia  #1 cavitary pneumonia:  She had been on vancomycin ceftriaxone and metronidazole but then was narrowed to vancomycin and Unasyn.  In the interim she had worsening leukocytosis and lactic acid went up and she was broadened back from Unasyn to ceftriaxone and metronidazole.  Her MRSA PCR is negative and she had no clinical improvement initially on doxycycline prescribed by PCP  I think we can argue our way out of needing to cover MRSA at present and I will discontinue the vancomycin.  Hopefully she does well on ceftriaxone and metronidazole we can translate this to oral therapy potentially with cefdinir and metronidazole with protracted treatment given her cavitary pathology.  I spent  36 minutes with the patient including than 50% of the time in face to face counseling of the patient regarding her cavitary pneumonia  personally reviewing chest x-ray CT of the lungs along with updated culture data and with review of medical records in preparation for the visit and during the visit and in coordination of her care.    LOS: 5 days   Alcide Evener 03/26/2021, 5:39 PM

## 2021-03-26 NOTE — Plan of Care (Signed)
  Problem: Education: Goal: Knowledge of General Education information will improve Description: Including pain rating scale, medication(s)/side effects and non-pharmacologic comfort measures Outcome: Progressing   Problem: Health Behavior/Discharge Planning: Goal: Ability to manage health-related needs will improve Outcome: Progressing   Problem: Clinical Measurements: Goal: Respiratory complications will improve Outcome: Progressing   

## 2021-03-27 DIAGNOSIS — I739 Peripheral vascular disease, unspecified: Secondary | ICD-10-CM | POA: Diagnosis not present

## 2021-03-27 DIAGNOSIS — J189 Pneumonia, unspecified organism: Secondary | ICD-10-CM | POA: Diagnosis not present

## 2021-03-27 DIAGNOSIS — I5023 Acute on chronic systolic (congestive) heart failure: Secondary | ICD-10-CM | POA: Diagnosis not present

## 2021-03-27 DIAGNOSIS — J9601 Acute respiratory failure with hypoxia: Secondary | ICD-10-CM | POA: Diagnosis not present

## 2021-03-27 DIAGNOSIS — A400 Sepsis due to streptococcus, group A: Secondary | ICD-10-CM

## 2021-03-27 DIAGNOSIS — L89004 Pressure ulcer of unspecified elbow, stage 4: Secondary | ICD-10-CM

## 2021-03-27 LAB — BASIC METABOLIC PANEL
Anion gap: 6 (ref 5–15)
BUN: 13 mg/dL (ref 8–23)
CO2: 26 mmol/L (ref 22–32)
Calcium: 8 mg/dL — ABNORMAL LOW (ref 8.9–10.3)
Chloride: 99 mmol/L (ref 98–111)
Creatinine, Ser: 0.98 mg/dL (ref 0.44–1.00)
GFR, Estimated: 59 mL/min — ABNORMAL LOW (ref >60)
Glucose, Bld: 102 mg/dL — ABNORMAL HIGH (ref 70–99)
Potassium: 3.8 mmol/L (ref 3.5–5.1)
Sodium: 131 mmol/L — ABNORMAL LOW (ref 135–145)

## 2021-03-27 LAB — CBC
HCT: 27.1 % — ABNORMAL LOW (ref 36.0–46.0)
Hemoglobin: 8.9 g/dL — ABNORMAL LOW (ref 12.0–15.0)
MCH: 30.7 pg (ref 26.0–34.0)
MCHC: 32.8 g/dL (ref 30.0–36.0)
MCV: 93.4 fL (ref 80.0–100.0)
Platelets: 165 10*3/uL (ref 150–400)
RBC: 2.9 MIL/uL — ABNORMAL LOW (ref 3.87–5.11)
RDW: 15.5 % (ref 11.5–15.5)
WBC: 11.4 10*3/uL — ABNORMAL HIGH (ref 4.0–10.5)
nRBC: 0 % (ref 0.0–0.2)

## 2021-03-27 LAB — MAGNESIUM: Magnesium: 1.7 mg/dL (ref 1.7–2.4)

## 2021-03-27 MED ORDER — METOPROLOL SUCCINATE ER 25 MG PO TB24
37.5000 mg | ORAL_TABLET | Freq: Once | ORAL | Status: AC
  Administered 2021-03-27: 37.5 mg via ORAL
  Filled 2021-03-27: qty 2

## 2021-03-27 MED ORDER — METOPROLOL SUCCINATE ER 50 MG PO TB24
50.0000 mg | ORAL_TABLET | Freq: Every day | ORAL | Status: DC
  Administered 2021-03-28: 50 mg via ORAL
  Filled 2021-03-27: qty 1

## 2021-03-27 MED ORDER — CEFUROXIME AXETIL 500 MG PO TABS
500.0000 mg | ORAL_TABLET | Freq: Two times a day (BID) | ORAL | Status: DC
  Administered 2021-03-27 – 2021-03-28 (×2): 500 mg via ORAL
  Filled 2021-03-27 (×3): qty 1

## 2021-03-27 MED ORDER — MAGNESIUM SULFATE 2 GM/50ML IV SOLN
2.0000 g | Freq: Once | INTRAVENOUS | Status: AC
Start: 2021-03-27 — End: 2021-03-28
  Administered 2021-03-27: 2 g via INTRAVENOUS
  Filled 2021-03-27: qty 50

## 2021-03-27 NOTE — Progress Notes (Signed)
[facility] Progress Note ? ?Patient: [name]    SWH:[id number]  DOA: 03/21/2021    ?Date of Service: the patient was seen and examined on 03/27/2021 ? 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  In emergency room, patient found to have sepsis from multilobar pneumonia and possible lung abscess causing acute respiratory failure with hypoxia.  Admitted to the hospitalist service and started on IV fluids and antibiotics.  Infectious disease consulted recommending antibiotic course and hopefully to avoid bronchoscopy/aspiration of abscess. ? ?By 3/10, clinically improving.  BNP elevated at 1100.  IV fluids stopped and patient started on IV Lasix with some good diuresis.  Antibiotics attempted to be simplified on the evening of 3/10 which led to increasing white blood cell count and lactic acid level by 3/12.  On night of 3/12, IV fluids restarted, Lasix stopped and antibiotics changed back to original regimen after discussion with infectious disease.  By 3/13, white blood cell count and lactic acid level had normalized.  Fluids stopped and Lasix restarted. ? ?Assessment and Plan: ?Assessment and Plan: ?* Acute on chronic systolic CHF (congestive heart failure) ([place]) ?Initially looked slightly dry.  With aggressive fluid resuscitation from sepsis, certainly now is in CHF as confirmed by BNP.  With sepsis stabilized, initially stop IV fluids and started Lasix.  Had to reverse course on 3/12, but now with sepsis restabilized, Lasix restarted and fluids stopped.   ? ?Sepsis with acute hypoxic respiratory failure without septic shock ([place]) ?Oxygenation improving.  Patient not on oxygen at home.  Sepsis initially stabilized, but  attempts to stop Flagyl/Rocephin and start Unasyn led to worsening white count and increasing lactic acid on 3/12 so antibiotics changed back to original regimen that evening plus IV fluids and Lasix stopped.  By 3/13, patient doing better and with lactic acid level and white blood cell count normalizing, fluids stopped, Lasix restarted with plans to keep patient on original regimen of vancomycin, Flagyl and Rocephin.   ? ?Acute anemia ?Baseline hemoglobin looks to be around 8.5-9.5 and came in with hemoglobin of 8.0, but then dropped to 6.8 within 14 hours.  Etiology unclear.  Does not look like pancytopenia.  Does not appear to be hemoconcentration.  She is 2 weeks out from chemotherapy.  Checking Hemoccult.  Patient received 1 unit packed red blood cells.  Following H&H closely.  Posttransfusion, hemoglobin up to 8.5 and remained stable. ? ?Community acquired pneumonia complicated by underlying lung cancer, apparent lung abscess on CT Chest  ?Given abscess, covering with broad spectrum antibiotics w/ MRSA coverage.  Clinically improving.  Infectious disease consulted.  For now, holding off on plans for aspiration or bronchoscopy and antibiotics only.  Infectious disease following with plans to change antibiotics over to by mouth in the next 24 hours ? ?Acute respiratory failure with hypoxia ([place]) ?Initially only required 2 L, but with fluid resuscitation, also in acute CHF and and was needing 4 L.  Should improve with IV Lasix.  We will work on weaning oxygen.  She is not on any oxygen at home.  Down to 1 L. ? 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  Last chemotherapy 2 weeks ago. ? ?Coronary artery disease s/p CABG ?continue statin, holding other meds as noted except lower dose beta blocker  ? ?Protein-calorie malnutrition, severe ?Nutrition Status: ?Nutrition Problem: Severe Malnutrition ?Etiology: chronic illness (stage 3 lung  cancer) ?Signs/Symptoms: moderate muscle depletion, severe muscle depletion, moderate fat depletion, severe fat depletion, energy intake < or equal to 75% for > or equal to 1 month, percent weight loss ?Interventions: Ensure Enlive (each supplement provides 350kcal and 20 grams of protein), MVI, Liberalize Diet ? ? ? ? ?Essential hypertension ?Holding some antihypertensives due to hypotension from sepsis and blood loss.  Pressures improved with blood and fluids. ? ?HLD (hyperlipidemia) ?Continue statin  ? ?Hyponatremia ?Improved w/ NS, likely dehydration ?Repeat BMP in AM ? ?PAD (peripheral artery disease) ([place]) ?Continue statin  ? ?Hypomagnesemia ?Replacing as needed ? ?Pressure injury of skin ?Pressure Injury 03/22/21 Sacrum Mid Stage 2 -  Partial thickness loss of dermis presenting as a shallow open injury with a red, pink wound bed without slough. (Active)  ?03/22/21 1300  ?Location: Sacrum  ?Location Orientation: Mid  ?Staging: Stage 2 -  Partial thickness loss of dermis presenting as a shallow open injury with a red, pink wound bed without slough.  ?Wound Description (Comments):   ?Present on Admission: Yes  ? ?Stage II sacral decubitus, present on admission.  Wound care as directed. ? ? ? ? ?Sinus tachycardia ?See above re: A&P for CAP ?Tachycardic and soft BP ?Holding most home antihypertensives  ?Reducing dose home metoprolol - she has not taken meds this AM and rebound tachycardia may be concurrent w/ sepsis, will see if HR improves w/ BB but close monitor VS  ?Hx HFrEF, caution w/ IV fluids to avoid precipitation HF exacerbation  ? ? ? ? ? ? ?Body mass index is 18.38 kg/m?Marland Kitchen  ?Nutrition Problem: Severe Malnutrition ?Etiology: chronic illness (stage 3 lung cancer) ?Pressure Injury 03/22/21 Sacrum Mid Stage 2 -  Partial thickness loss of dermis presenting as a shallow open injury with a red, pink wound bed without slough. (Active)  ?03/22/21 1300  ?Location: Sacrum  ?Location Orientation: Mid  ?Staging:  Stage 2 -  Partial thickness loss of dermis presenting as a shallow open injury with a red, pink wound bed without slough.  ?Wound Description (Comments):   ?Present on Admission: Yes  ?  ? ?Consultants: ?Infectious disease ?Pulmonary ? ?Procedures: ?none ? ?Antimicrobials: ?IV vancomycin 3/8-present ?IV doxycycline 3/8-3/9 ?IV Flagyl 3/9-3/10, restarted 3/12 ?IV Rocephin 3/8-3/10, restarted 3/12 ?IV Unasyn 3/10-3/12 ? ?Code Status: Full code ? ? ?Subjective: Patient feeling little bit better.  More appetite today. ?Objective: ?Tachycardia improved ?Vitals:  ? 03/27/21 0835 03/27/21 1148  ?BP: 102/85 (!) 105/55  ?Pulse: 87 88  ?Resp: 19 (!) 21  ?Temp: 98 ?F (36.7 ?C) 98.1 ?F (36.7 ?C)  ?SpO2: 99% 99%  ? ? ?Intake/Output Summary (Last 24 hours) at 03/27/2021 1412 ?Last data filed at 03/27/2021 0037 ?Gross per 24 hour  ?Intake 310.58 ml  ?Output 500 ml  ?Net -189.42 ml  ? ?Filed Weights  ? 03/26/21 1039  ?Weight: 42.7 kg  ? ?Body mass index is 18.38 kg/m?. ? ?Exam: ? ?General: Alert and oriented x3, less fatigued today ?HEENT: Normocephalic, atraumatic, mucous membranes slightly dry ?Cardiovascular: Regular rate and rhythm, S1-S2 ?Respiratory: Poor inspiratory effort, decreased breath sounds throughout, few rhonchi ?Abdomen: Soft, nontender, nondistended, positive bowel sounds ?Musculoskeletal: No clubbing or cyanosis, trace pitting edema ?Skin: No skin breaks, tears or lesions.  Exception stage  II sacral decub ?Psychiatry: Appropriate, no evidence of psychoses ?Neurology: No focal deficits ? ?Data Reviewed: ?White blood cell count continues to improve, down to 11.4.  Hemoglobin stable at 8.9. ? ?Disposition:  ?Status is: Inpatient ?Remains inpatient appropriate because: Continued IV antibiotics.  Potential discharge tomorrow or Thursday once white count normalized and can change antibiotics over to by mouth.  She will go home with home health. ?  ? ?Family Communication: Granddaughter at the bedside. ?DVT  Prophylaxis: ?enoxaparin (LOVENOX) injection 30 mg Start: 03/21/21 2200 ? ? ? ?Author: ?Annita Brod ,MD ?03/27/2021 2:12 PM ? ?To reach On-call, see care teams to locate the attending and reach out via [email or web address]. ?Be

## 2021-03-27 NOTE — Progress Notes (Signed)
Physical Therapy Treatment ?Patient Details ?Name: [name] ?MRN: [id number] ?DOB: March 07, 1940 ?Today's Date: 03/27/2021 ? ? ?History of Present Illness Patient is a 81 year old female with acute on chronic systolic CHF, sepsis with acute hypoxic respiratory failure without septic shock, community acquired pneumonia complicated by underlying lung cancer. 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 ?  ?PT Comments  ? ? Pt initially did not feel like doing much but with encouragement and discussion with PT and g-daughter she did agree that some activity will be good for her (and that she has not done much much in the last week).  She was able to ambulate ~60ft into the hallway with Debella, she is not at baseline and required a Behrman and still showed some unsteadiness.  Pt's O2 was in the high 90s on 2L on arrival, attempted to trial activity on room air, but limited effort with bed mobility caused significant fatigue and drop in O2 to 87% relatively quickly (stayed mid 90s on room air before activity).  Discussed with pt and family the importance of having regular assist/check-ins when she goes home as she is far from her baseline and will require increased assist and caution.  ?Recommendations for follow up therapy are one component of a multi-disciplinary discharge planning process, led by the attending physician.  Recommendations may be updated based on patient status, additional functional criteria and insurance authorization. ? ?Follow Up Recommendations ? Home health PT ?  ?  ?Assistance Recommended at Discharge Intermittent Supervision/Assistance  ?Patient can return home with the following A little help with walking and/or transfers;A little help with bathing/dressing/bathroom;Assist for transportation;Help with stairs or ramp for entrance ?  ?Equipment Recommendations ?  (O2)  ?  ?Recommendations for Other Services   ? ? ?  ?Precautions /  Restrictions Precautions ?Precautions: Fall ?Restrictions ?Weight Bearing Restrictions: No  ?  ? ?Mobility ? Bed Mobility ?Overal bed mobility: Needs Assistance ?Bed Mobility: Supine to Sit ?  ?  ?Supine to sit: Min guard ?  ?  ?General bed mobility comments: increased time and effort required with no physical assistance needed. cues for technique/sequencing.  Pt able to don socks w/o assist while in bed ?  ? ?Transfers ?Overall transfer level: Needs assistance ?Equipment used: Rolling Pinkham (2 wheels) ?Transfers: Sit to/from Stand ?Sit to Stand: Min guard ?  ?  ?  ?  ?  ?General transfer comment: Minimal cuing for appropriate sequencing and set up, pt able to rise w/o direct assist ?  ? ?Ambulation/Gait ?Ambulation/Gait assistance: Min guard ?Gait Distance (Feet): 65 Feet ?Assistive device: Rolling Lorson (2 wheels) ?  ?  ?  ?  ?General Gait Details: Pt was able to ambulate into the hallway and back to room with consistent but somewhat unsteady gait.  She apparently does not typically need an AD but clearly needed some UE support this effort.  She showed some ataxia with near scissoring steppage and was relatively quick to fatigue. ? ? ?Stairs ?  ?  ?  ?  ?  ? ? ?Wheelchair Mobility ?  ? ?Modified Rankin (Stroke Patients Only) ?  ? ? ?  ?Balance Overall balance assessment: Needs assistance ?Sitting-balance support: No upper extremity supported ?Sitting balance-Leahy Scale: Good ?  ?  ?Standing balance support: Bilateral upper extremity supported ?Standing balance-Leahy Scale: Fair ?Standing balance comment: clearly reliant on Gotay in standing, does not use AD at baseline ?  ?  ?  ?  ?  ?  ?  ?  ?  ?  ?  ?  ? ?  ?  Cognition Arousal/Alertness: Awake/alert ?Behavior During Therapy: [facility] for tasks assessed/performed ?Overall Cognitive Status: Within Functional Limits for tasks assessed ?  ?  ?  ?  ?  ?  ?  ?  ?  ?  ?  ?  ?  ?  ?  ?  ?  ?  ?  ? ?  ?Exercises   ? ?  ?General Comments General comments (skin integrity,  edema, etc.): Pt's O2 was in the high 90s on 2L on arrival, attempted some light mobility on room air with sats dropping relatively quickly below 88% and SOB with subjective fatigue.  After reapplying 2L SpO2 quickly increased back to the upper 90s.  O2 on 2L during ambulation dropped from 99 to mid/low 90s - again with signficant fatigue. ?  ?  ? ?Pertinent Vitals/Pain Pain Assessment ?Pain Assessment: No/denies pain  ? ? ?Home Living   ?  ?  ?  ?  ?  ?  ?  ?  ?  ?   ?  ?Prior Function    ?  ?  ?   ? ?PT Goals (current goals can now be found in the care plan section) Progress towards PT goals: Progressing toward goals ? ?  ?Frequency ? ? ? Min 2X/week ? ? ? ?  ?PT Plan Current plan remains appropriate  ? ? ?Co-evaluation   ?  ?  ?  ?  ? ?  ?AM-PAC PT "6 Clicks" Mobility   ?Outcome Measure ? Help needed turning from your back to your side while in a flat bed without using bedrails?: None ?Help needed moving from lying on your back to sitting on the side of a flat bed without using bedrails?: None ?Help needed moving to and from a bed to a chair (including a wheelchair)?: A Little ?Help needed standing up from a chair using your arms (e.g., wheelchair or bedside chair)?: None ?Help needed to walk in hospital room?: A Little ?Help needed climbing 3-5 steps with a railing? : A Lot ?6 Click Score: 20 ? ?  ?End of Session Equipment Utilized During Treatment: Gait belt;Oxygen ?Activity Tolerance: Patient limited by fatigue ?Patient left: with chair alarm set;with call bell/phone within reach;with family/visitor present ?Nurse Communication: Mobility status (O2 drop on room air) ?PT Visit Diagnosis: Muscle weakness (generalized) (M62.81);Unsteadiness on feet (R26.81) ?  ? ? ?Time: 5374-8270 ?PT Time Calculation (min) (ACUTE ONLY): 34 min ? ?Charges:  $Gait Training: 8-22 mins ?$Therapeutic Activity: 8-22 mins          ?          ? ?Kreg Shropshire, DPT ?03/27/2021, 4:15 PM ? ?

## 2021-03-27 NOTE — Progress Notes (Signed)
? ? ? ? ? ? ?Subjective: ?No new complaints ? ? ?Antibiotics:  ?Anti-infectives (From admission, onward)  ? ? Start     Dose/Rate Route Frequency Ordered Stop  ? 03/27/21 1700  cefUROXime (CEFTIN) tablet 500 mg       ? 500 mg Oral 2 times daily with meals 03/27/21 1209    ? 03/26/21 2200  metroNIDAZOLE (FLAGYL) tablet 500 mg       ? 500 mg Oral Every 12 hours 03/26/21 1747    ? 03/25/21 2000  metroNIDAZOLE (FLAGYL) IVPB 500 mg  Status:  Discontinued       ? 500 mg ?100 mL/hr over 60 Minutes Intravenous Every 12 hours 03/25/21 1513 03/26/21 1747  ? 03/25/21 1800  cefTRIAXone (ROCEPHIN) 2 g in sodium chloride 0.9 % 100 mL IVPB  Status:  Discontinued       ? 2 g ?200 mL/hr over 30 Minutes Intravenous Every 24 hours 03/25/21 1513 03/27/21 1209  ? 03/24/21 0824  vancomycin variable dose per unstable renal function (pharmacist dosing)  Status:  Discontinued       ?  Does not apply See admin instructions 03/24/21 0824 03/24/21 0842  ? 03/23/21 1800  Ampicillin-Sulbactam (UNASYN) 3 g in sodium chloride 0.9 % 100 mL IVPB  Status:  Discontinued       ? 3 g ?200 mL/hr over 30 Minutes Intravenous Every 6 hours 03/23/21 1639 03/25/21 1513  ? 03/22/21 1800  vancomycin (VANCOREADY) IVPB 500 mg/100 mL  Status:  Discontinued       ? 500 mg ?100 mL/hr over 60 Minutes Intravenous Every 24 hours 03/21/21 1610 03/26/21 1553  ? 03/22/21 1800  metroNIDAZOLE (FLAGYL) IVPB 500 mg  Status:  Discontinued       ? 500 mg ?100 mL/hr over 60 Minutes Intravenous Every 12 hours 03/22/21 1617 03/23/21 1639  ? 03/22/21 1000  cefTRIAXone (ROCEPHIN) 2 g in sodium chloride 0.9 % 100 mL IVPB  Status:  Discontinued       ? 2 g ?200 mL/hr over 30 Minutes Intravenous Every 24 hours 03/21/21 1525 03/23/21 1639  ? 03/22/21 0400  doxycycline (VIBRAMYCIN) 100 mg in sodium chloride 0.9 % 250 mL IVPB  Status:  Discontinued       ? 100 mg ?125 mL/hr over 120 Minutes Intravenous Every 12 hours 03/21/21 1525 03/22/21 1617  ? 03/21/21 1545  vancomycin (VANCOCIN)  IVPB 1000 mg/200 mL premix       ? 1,000 mg ?200 mL/hr over 60 Minutes Intravenous  Once 03/21/21 1537 03/21/21 2049  ? 03/21/21 1345  cefTRIAXone (ROCEPHIN) 2 g in sodium chloride 0.9 % 100 mL IVPB  Status:  Discontinued       ? 2 g ?200 mL/hr over 30 Minutes Intravenous Every 24 hours 03/21/21 1336 03/21/21 1535  ? 03/21/21 1345  doxycycline (VIBRAMYCIN) 100 mg in sodium chloride 0.9 % 250 mL IVPB       ? 100 mg ?125 mL/hr over 120 Minutes Intravenous STAT 03/21/21 1336 03/21/21 1700  ? 03/21/21 1330  levofloxacin (LEVAQUIN) IVPB 750 mg  Status:  Discontinued       ? 750 mg ?100 mL/hr over 90 Minutes Intravenous  Once 03/21/21 1323 03/21/21 1335  ? ?  ? ? ?Medications: ?Scheduled Meds: ? atorvastatin  20 mg Oral Daily  ? calcium-vitamin D  1 tablet Oral Daily  ? cefUROXime  500 mg Oral BID WC  ? Chlorhexidine Gluconate Cloth  6 each Topical Daily  ? enoxaparin (  LOVENOX) injection  30 mg Subcutaneous Q24H  ? feeding supplement  237 mL Oral TID BM  ? furosemide  20 mg Intravenous BID  ? magnesium oxide  400 mg Oral Daily  ? [START ON 03/28/2021] metoprolol succinate  50 mg Oral Daily  ? metroNIDAZOLE  500 mg Oral Q12H  ? multivitamin with minerals  1 tablet Oral Daily  ? potassium chloride  20 mEq Oral BID  ? sodium chloride flush  10-40 mL Intracatheter Q12H  ? ?Continuous Infusions: ? ? ?PRN Meds:.chlorpheniramine-HYDROcodone, ipratropium-albuterol, oxyCODONE, sodium chloride flush ? ? ? ?Objective: ?Weight change:  ? ?Intake/Output Summary (Last 24 hours) at 03/27/2021 1516 ?Last data filed at 03/27/2021 0037 ?Gross per 24 hour  ?Intake 70.58 ml  ?Output 500 ml  ?Net -429.42 ml  ? ? ?Blood pressure (!) 105/55, pulse 88, temperature 98.1 ?F (36.7 ?C), resp. rate (!) 21, height 5' (1.524 m), weight 42.7 kg, SpO2 99 %. ?Temp:  [97.8 ?F (36.6 ?C)-99.4 ?F (37.4 ?C)] 98.1 ?F (36.7 ?C) (03/14 1148) ?Pulse Rate:  [82-103] 88 (03/14 1148) ?Resp:  [19-21] 21 (03/14 1148) ?BP: (102-119)/(51-85) 105/55 (03/14 1148) ?SpO2:  [98  %-100 %] 99 % (03/14 1148) ? ?Physical Exam: ?Physical Exam ?Constitutional:   ?   Appearance: She is cachectic. She is ill-appearing.  ?Eyes:  ?   Extraocular Movements: Extraocular movements intact.  ?Cardiovascular:  ?   Rate and Rhythm: Normal rate and regular rhythm.  ?Pulmonary:  ?   Effort: Pulmonary effort is normal. No respiratory distress.  ?   Breath sounds: No wheezing.  ?Abdominal:  ?   General: There is no distension.  ?Skin: ?   Coloration: Skin is pale.  ?Neurological:  ?   General: No focal deficit present.  ?   Mental Status: She is alert and oriented to person, place, and time.  ?Psychiatric:     ?   Mood and Affect: Mood normal.     ?   Behavior: Behavior normal.     ?   Thought Content: Thought content normal.     ?   Judgment: Judgment normal.  ?  ? ?CBC: ? ? ? ?BMET ?Recent Labs  ?  03/26/21 ?0453 03/27/21 ?8338  ?NA 132* 131*  ?K 4.2 3.8  ?CL 99 99  ?CO2 25 26  ?GLUCOSE 102* 102*  ?BUN 13 13  ?CREATININE 0.86 0.98  ?CALCIUM 7.5* 8.0*  ? ? ? ? ?Liver Panel ? ?Recent Labs  ?  03/25/21 ?0440  ?PROT 6.2*  ?ALBUMIN 2.0*  ?AST 19  ?ALT 12  ?ALKPHOS 62  ?BILITOT 0.5  ? ? ? ? ? ? ?Sedimentation Rate ?No results for input(s): ESRSEDRATE in the last 72 hours. ?C-Reactive Protein ?No results for input(s): CRP in the last 72 hours. ? ?Micro Results: ?Recent Results (from the past 720 hour(s))  ?Culture, blood (Routine x 2)     Status: None  ? Collection Time: 03/21/21 10:18 AM  ? Specimen: BLOOD  ?Result Value Ref Range Status  ? Specimen Description BLOOD PORT  Final  ? Special Requests   Final  ?  BOTTLES DRAWN AEROBIC AND ANAEROBIC Blood Culture adequate volume  ? Culture   Final  ?  NO GROWTH 5 DAYS ?Performed at [facility], [address], [place], [place] [address] ?  ? Report Status 03/26/2021 FINAL  Final  ?Culture, blood (Routine x 2)     Status: None  ? Collection Time: 03/21/21 10:19 AM  ? Specimen: BLOOD  ?  Result Value Ref Range Status  ? Specimen Description BLOOD RIGHT HAND   Final  ? Special Requests   Final  ?  BOTTLES DRAWN AEROBIC AND ANAEROBIC Blood Culture adequate volume  ? Culture   Final  ?  NO GROWTH 5 DAYS ?Performed at [facility], [address] [place], [place] [address] ?  ? Report Status 03/26/2021 FINAL  Final  ?Resp Panel by RT-PCR (Flu A&B, Covid) Nasopharyngeal Swab     Status: None  ? Collection Time: 03/21/21 10:32 AM  ? Specimen: Nasopharyngeal Swab; Nasopharyngeal(NP) swabs in vial transport medium  ?Result Value Ref Range Status  ? SARS Coronavirus 2 by RT PCR NEGATIVE NEGATIVE Final  ?  Comment: (NOTE) ?SARS-CoV-2 target nucleic acids are NOT DETECTED. ? ?The SARS-CoV-2 RNA is generally detectable in upper respiratory ?specimens during the acute phase of infection. The lowest ?concentration of SARS-CoV-2 viral copies this assay can detect is ?138 copies/mL. A negative result does not preclude SARS-Cov-2 ?infection and should not be used as the sole basis for treatment or ?other patient management decisions. A negative result may occur with  ?improper specimen collection/handling, submission of specimen other ?than nasopharyngeal swab, presence of viral mutation(s) within the ?areas targeted by this assay, and inadequate number of viral ?copies(<138 copies/mL). A negative result must be combined with ?clinical observations, patient history, and epidemiological ?information. The expected result is Negative. ? ?Fact Sheet for Patients:  ?EntrepreneurPulse.com.au ? ?Fact Sheet for Healthcare Providers:  ?IncredibleEmployment.be ? ?This test is no t yet approved or cleared by the [place] FDA and  ?has been authorized for detection and/or diagnosis of SARS-CoV-2 by ?FDA under an Emergency Use Authorization (EUA). This EUA will remain  ?in effect (meaning this test can be used) for the duration of the ?COVID-19 declaration under Section 564(b)(1) of the Act, 21 ?U.S.C.section 360bbb-3(b)(1), unless the authorization  is terminated  ?or revoked sooner.  ? ? ?  ? Influenza A by PCR NEGATIVE NEGATIVE Final  ? Influenza B by PCR NEGATIVE NEGATIVE Final  ?  Comment: (NOTE) ?The Xpert Xpress SARS-CoV-2/FLU/RSV plus assay is i

## 2021-03-27 NOTE — Plan of Care (Signed)
  Problem: Clinical Measurements: Goal: Ability to maintain clinical measurements within normal limits will improve Outcome: Progressing   

## 2021-03-27 NOTE — TOC Progression Note (Signed)
Transition of Care (TOC) - Progression Note  ? ? ?Patient Details  ?Name: [name] ?MRN: [id number] ?Date of Birth: April 17, 1940 ? ?Transition of Care (TOC) CM/SW Contact  ?Candie Chroman, LCSW ?Phone Number: ?03/27/2021, 4:28 PM ? ?Clinical Narrative: Patient sleeping. Gave granddaughter brochures for life alert programs. Preferred home health agency is [facility]. Representative didn't answer phone so sent referral in secure email. Per PT, patient qualifies for oxygen. Asked MD for DME order. Will order through [employer].   ? ?Expected Discharge Plan: [place] ?Barriers to Discharge: Continued Medical Work up ? ?Expected Discharge Plan and Services ?Expected Discharge Plan: [place] ?  ?  ?Post Acute Care Choice: Durable Medical Equipment, Home Health ?Living arrangements for the past 2 months: [place] ?                ?  ?  ?  ?  ?  ?  ?  ?  ?  ?  ? ? ?Social Determinants of Health (SDOH) Interventions ?  ? ?Readmission Risk Interventions ?Readmission Risk Prevention Plan 03/26/2021 05/09/2020  ?Post Dischage Appt - Complete  ?Medication Screening - Complete  ?Transportation Screening Complete Complete  ?Medication Review Press photographer) Complete -  ?PCP or Specialist appointment within 3-5 days of discharge Complete -  ?SW Recovery Care/Counseling Consult Complete -  ?Palliative Care Screening Not Applicable -  ?[place] Not Applicable -  ?Some recent data might be hidden  ? ? ?

## 2021-03-28 ENCOUNTER — Inpatient Hospital Stay: Payer: Medicare Other

## 2021-03-28 ENCOUNTER — Inpatient Hospital Stay: Payer: Medicare Other | Admitting: Oncology

## 2021-03-28 ENCOUNTER — Encounter: Payer: Self-pay | Admitting: Oncology

## 2021-03-28 LAB — BASIC METABOLIC PANEL
Anion gap: 0 — ABNORMAL LOW (ref 5–15)
BUN: 13 mg/dL (ref 8–23)
CO2: 31 mmol/L (ref 22–32)
Calcium: 7.9 mg/dL — ABNORMAL LOW (ref 8.9–10.3)
Chloride: 99 mmol/L (ref 98–111)
Creatinine, Ser: 1 mg/dL (ref 0.44–1.00)
GFR, Estimated: 58 mL/min — ABNORMAL LOW (ref >60)
Glucose, Bld: 93 mg/dL (ref 70–99)
Potassium: 3.9 mmol/L (ref 3.5–5.1)
Sodium: 130 mmol/L — ABNORMAL LOW (ref 135–145)

## 2021-03-28 LAB — CBC
HCT: 26.1 % — ABNORMAL LOW (ref 36.0–46.0)
Hemoglobin: 8.5 g/dL — ABNORMAL LOW (ref 12.0–15.0)
MCH: 30.7 pg (ref 26.0–34.0)
MCHC: 32.6 g/dL (ref 30.0–36.0)
MCV: 94.2 fL (ref 80.0–100.0)
Platelets: 169 10*3/uL (ref 150–400)
RBC: 2.77 MIL/uL — ABNORMAL LOW (ref 3.87–5.11)
RDW: 15.4 % (ref 11.5–15.5)
WBC: 7.6 10*3/uL (ref 4.0–10.5)
nRBC: 0 % (ref 0.0–0.2)

## 2021-03-28 MED ORDER — METRONIDAZOLE 500 MG PO TABS: 500.0000 mg | ORAL_TABLET | Freq: Two times a day (BID) | ORAL | 0 refills | Status: AC

## 2021-03-28 MED ORDER — CEFUROXIME AXETIL 500 MG PO TABS: 500.0000 mg | ORAL_TABLET | Freq: Two times a day (BID) | ORAL | 0 refills | Status: AC

## 2021-03-28 MED ORDER — HEPARIN SOD (PORK) LOCK FLUSH 100 UNIT/ML IV SOLN
500.0000 [IU] | Freq: Once | INTRAVENOUS | Status: AC
  Administered 2021-03-28: 500 [IU] via INTRAVENOUS
  Filled 2021-03-28 (×2): qty 5

## 2021-03-28 NOTE — TOC Progression Note (Addendum)
Transition of Care (TOC) - Progression Note  ? ? ?Patient Details  ?Name: [name] ?MRN: [id number] ?Date of Birth: October 25, 1940 ? ?Transition of Care (TOC) CM/SW Contact  ?Alberteen Sam, LCSW ?Phone Number: ?03/28/2021, 10:06 AM ? ?Clinical Narrative:    ? ?DME orders are in for 2L O2, CSW has informed Danielle with [employer] who will order.  ? ?CSW has reached out to [place] with [facility] to confirm acceptance for Home Health services, he confirms acceptance and informed of dc today. .  ? ? ?Expected Discharge Plan: [place] ?Barriers to Discharge: Continued Medical Work up ? ?Expected Discharge Plan and Services ?Expected Discharge Plan: [place] ?  ?  ?Post Acute Care Choice: Durable Medical Equipment, Home Health ?Living arrangements for the past 2 months: [place] ?                ?  ?  ?  ?  ?  ?  ?  ?  ?  ?  ? ? ?Social Determinants of Health (SDOH) Interventions ?  ? ?Readmission Risk Interventions ?Readmission Risk Prevention Plan 03/26/2021 05/09/2020  ?Post Dischage Appt - Complete  ?Medication Screening - Complete  ?Transportation Screening Complete Complete  ?Medication Review Press photographer) Complete -  ?PCP or Specialist appointment within 3-5 days of discharge Complete -  ?SW Recovery Care/Counseling Consult Complete -  ?Palliative Care Screening Not Applicable -  ?[place] Not Applicable -  ?Some recent data might be hidden  ? ? ?

## 2021-03-28 NOTE — Progress Notes (Signed)
Nutrition Follow-up ? ?DOCUMENTATION CODES:  ? ?Severe malnutrition in context of chronic illness ? ?INTERVENTION:  ? ?-D/c Ensure Enlive po TID, each supplement provides 350 kcal and 20 grams of protein ?-Continue MVI with minerals daily ?-Continue 500 mg vitamin C BID ?-Continue 220 mg zinc sulfate daily x 14 days ?-Continue regular diet ?-Snacks between meals ? ?NUTRITION DIAGNOSIS:  ? ?Severe Malnutrition related to chronic illness (stage 3 lung cancer) as evidenced by moderate muscle depletion, severe muscle depletion, moderate fat depletion, severe fat depletion, energy intake < or equal to 75% for > or equal to 1 month, percent weight loss. ? ?Ongoing ? ?GOAL:  ? ?Patient will meet greater than or equal to 90% of their needs ? ?Progressing  ? ?MONITOR:  ? ?PO intake, Supplement acceptance, Labs, Weight trends, Skin, I & O's ? ?REASON FOR ASSESSMENT:  ? ?Consult ?Assessment of nutrition requirement/status ? ?ASSESSMENT:  ? ?[name] is a 81 y.o. 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 To ED 03/21/21 from cancer center complaining of shortness of breath cough and congestion x3 days.  Found to be hypoxic over at the cancer center (86-90% on RA) and sent to the ED on nasal cannula.  She does not wear oxygen at home.  She reports shortness of breath constantly, worse laying down.  Denies chest pain. ? ?Reviewed I/O's: +150 ml x 24 hours and +2.5 L since admission  ? ?Spoke with pt and granddaughter at bedside. Pt reports her appetite is improving; noted meal completions 50-100%. Pt reports she is trying to consume at least 50% of her meals. Pt granddaughter repots concern that pt has not eaten breakfast today- pt consumed about half of her omelette for breakfast yesterday and a half of a chocolate bar yesterday. Pt reports she tries to eat, but is often not hungry and did not eat breakfast today "because I'm not  a breakfast eater". Per pt, she often grazes throughout the day. Pt does not like the Ensure supplements, stating they are too sweet. Reviewed other supplements on the formulary, which pt also refused.  ? ?RD discussed importance of good meal intake to promote healing. Discussed ways to add more calories and protein in her diet and encouraged her to take advantage of times her appetite was good. Granddaughter supportive and willing to bring in outside food for pt is she desires. Also ordered pt's lunch for her (cheese and crackers, fruit, and orange sherbet).  ? ?Medications reviewed and include calcium with vitamin D, magnesium oxide, and potassium chloride.  ? ?Labs reviewed: Na: 130.   ? ?Diet Order:   ?Diet Order   ? ?       ?  Diet regular Room service appropriate? Yes; Fluid consistency: Thin  Diet effective now       ?  ? ?  ?  ? ?  ? ? ?EDUCATION NEEDS:  ? ?Education needs have been addressed ? ?Skin:  Skin Assessment: Skin Integrity Issues: ?Skin Integrity Issues:: Stage II ?Stage II: sacrum ? ?Last BM:  03/27/21 ? ?Height:  ? ?Ht Readings from Last 1 Encounters:  ?03/26/21 5' (1.524 m)  ? ? ?Weight:  ? ?Wt Readings from Last 1 Encounters:  ?03/28/21 43.2 kg  ? ? ?Ideal Body Weight:  45.5 kg ? ?BMI:  Body mass index is 18.6 kg/m?. ? ?Estimated Nutritional Needs:  ? ?Kcal:  1650-1850 ? ?Protein:  80-95 grams ? ?Fluid:  > 1.6 L ? ? ? ?Loistine Chance, RD, LDN, CDCES ?Registered Dietitian II ?Certified Diabetes Care and Education Specialist ?Please refer to [facility] for RD and/or RD on-call/weekend/after hours pager  ?

## 2021-03-28 NOTE — Discharge Summary (Signed)
?[facility] ? ?Physician Discharge Summary  ? ?Patient ID: ?[name] ?MRN: [id number] ?DOB/AGE: 09-20-1940 81 y.o. ? ?Admit date: 03/21/2021 ?Discharge date: 03/28/2021   ? ?PCP: Derinda Late, MD ? ?DISCHARGE DIAGNOSES:  ?Principal Problem: ?  Acute on chronic systolic CHF (congestive heart failure) ([place]) ?Active Problems: ?  Sepsis with acute hypoxic respiratory failure without septic shock ([place]) ?  Community acquired pneumonia complicated by underlying lung cancer, apparent lung abscess on CT Chest  ?  Acute anemia ?  Acute respiratory failure with hypoxia ([place]) ?  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  Coronary artery disease s/p CABG ?  Protein-calorie malnutrition, severe ?  Essential hypertension ?  HLD (hyperlipidemia) ?  PAD (peripheral artery disease) ([place]) ?  Hyponatremia ?  S/P partial lobectomy of lung ?  Pressure injury of skin ?  Hypomagnesemia ?  Cavitary pneumonia ? ? ?RECOMMENDATIONS FOR OUTPATIENT FOLLOW UP: ?Ambulatory referral sent to infectious disease for follow-up ? ? ?Home Health: PT and OT ?Equipment/Devices: None ? ?CODE STATUS: Full code ? ?DISCHARGE CONDITION: fair ? ?Diet recommendation: As before ? 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  In emergency room, patient found to have sepsis from multilobar pneumonia and possible lung abscess causing acute respiratory failure with hypoxia.  Admitted to the hospitalist service and started on IV fluids and antibiotics.  Infectious disease consulted recommending antibiotic course and hopefully to avoid bronchoscopy/aspiration of abscess. ? ?Consultants: ?Infectious disease ?Pulmonary ?  ?Procedures: ?none ? ?HOSPITAL COURSE:   ? ? ?* Acute on chronic systolic CHF (congestive heart failure) ([place]) ?Initially looked slightly dry.  With aggressive fluid resuscitation from sepsis, certainly now is in CHF as confirmed by BNP.  With sepsis stabilized, initially stop IV fluids and started Lasix.  Had to reverse course on 3/12, but now with sepsis restabilized, Lasix restarted and fluids stopped.   ? ?Sepsis with acute hypoxic respiratory failure without septic shock ([place]) ?Sepsis seems to have resolved. ?Will need oxygen at home which has been ordered. ? ?Acute anemia ?Baseline hemoglobin looks to be around 8.5-9.5 and came in with hemoglobin of 8.0, but then dropped to 6.8 within 14 hours.  Etiology unclearShe is 2 weeks out from chemotherapy.  Patient received 1 unit packed red blood cells.  Following H&H closely.  Posttransfusion, hemoglobin up to 8.5 and remained stable. ? ?Community acquired pneumonia complicated by underlying lung cancer, apparent lung abscess on CT Chest  ?Given abscess, covering with broad spectrum antibiotics w/ MRSA coverage.  Clinically improving.  Infectious disease consulted.  Will be discharged on oral antibiotics with cefuroxime and metronidazole.   ? ?Acute respiratory failure with hypoxia ([place]) ?Initially only required 2 L, but with fluid resuscitation, also in acute CHF and and was needing 4 L.  Improved with Lasix but continues to become hypoxic with ambulation.  Home oxygen will be prescribed. ? 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  Follow-up with medical oncology. ? ?Coronary artery disease s/p CABG ? ?Protein-calorie malnutrition, severe ?Nutrition Status: ?Nutrition Problem: Severe Malnutrition ?Etiology: chronic illness (stage 3 lung cancer) ?Signs/Symptoms: moderate muscle depletion, severe muscle depletion, moderate fat  depletion, severe fat depletion, energy intake < or equal to 75% for > or equal to 1 month, percent weight  loss ?Interventions: Ensure Enlive (each supplement provides 350kcal and 20 grams of protein), MVI, Liberalize Diet ? ?Essential hypertension ? ?HLD (hyperlipidemia) ?Continue statin  ? ?Hyponatremia ? ?PAD (peripheral artery disease) ([place]) ?Continue statin  ? ?Hypomagnesemia ?Replacing as needed ? ?Pressure injury of skin ?Pressure Injury 03/22/21 Sacrum Mid Stage 2 -  Partial thickness loss of dermis presenting as a shallow open injury with a red, pink wound bed without slough. (Active)  ?03/22/21 1300  ?Location: Sacrum  ?Location Orientation: Mid  ?Staging: Stage 2 -  Partial thickness loss of dermis presenting as a shallow open injury with a red, pink wound bed without slough.  ?Wound Description (Comments):   ?Present on Admission: Yes  ? ?Stage II sacral decubitus, present on admission.  Wound care as directed. ? ?Sinus tachycardia ? ? ?Patient is stable.  Wants to go home today.  Okay for discharge. ? ? ? ?PERTINENT LABS: ? ?The results of significant diagnostics from this hospitalization (including imaging, microbiology, ancillary and laboratory) are listed below for reference.   ? ?Microbiology: ?Recent Results (from the past 240 hour(s))  ?Culture, blood (Routine x 2)     Status: None  ? Collection Time: 03/21/21 10:18 AM  ? Specimen: BLOOD  ?Result Value Ref Range Status  ? Specimen Description BLOOD PORT  Final  ? Special Requests   Final  ?  BOTTLES DRAWN AEROBIC AND ANAEROBIC Blood Culture adequate volume  ? Culture   Final  ?  NO GROWTH 5 DAYS ?Performed at [facility], [address] [place], [place] [address] ?  ? Report Status 03/26/2021 FINAL  Final  ?Culture, blood (Routine x 2)     Status: None  ? Collection Time: 03/21/21 10:19 AM  ? Specimen: BLOOD  ?Result Value Ref Range Status  ? Specimen Description BLOOD RIGHT HAND  Final  ? Special Requests   Final  ?  BOTTLES DRAWN AEROBIC AND ANAEROBIC Blood Culture adequate volume  ? Culture   Final  ?  NO GROWTH 5 DAYS ?Performed at  [facility], [address] [place], [place] [address] ?  ? Report Status 03/26/2021 FINAL  Final  ?Resp Panel by RT-PCR (Flu A&B, Covid) Nasopharyngeal Swab     Status: None  ? Collection Time: 03/21/21 10:32 AM  ? Specimen: Nasopharyngeal Swab; Nasopharyngeal(NP) swabs in vial transport medium  ?Result Value Ref Range Status  ? SARS Coronavirus 2 by RT PCR NEGATIVE NEGATIVE Final  ?  Comment: (NOTE) ?SARS-CoV-2 target nucleic acids are NOT DETECTED. ? ?The SARS-CoV-2 RNA is generally detectable in upper respiratory ?specimens during the acute phase of infection. The lowest ?concentration of SARS-CoV-2 viral copies this assay can detect is ?138 copies/mL. A negative result does not preclude SARS-Cov-2 ?infection and should not be used as the sole basis for treatment or ?other patient management decisions. A negative result may occur with  ?improper specimen collection/handling, submission of specimen other ?than nasopharyngeal swab, presence of viral mutation(s) within the ?areas targeted by this assay, and inadequate number of viral ?copies(<138 copies/mL). A negative result must be combined with ?clinical observations, patient history, and epidemiological ?information. The expected result is Negative. ? ?Fact Sheet for Patients:  ?EntrepreneurPulse.com.au ? ?Fact Sheet for Healthcare Providers:  ?IncredibleEmployment.be ? ?This test is no t yet approved or cleared by the [place] and  ?has been authorized for detection  and/or diagnosis of SARS-CoV-2 by ?FDA under an Emergency Use Authorization (EUA). This EUA will remain  ?in effect (meaning this test can be used) for the duration of the ?COVID-19 declaration under Section 564(b)(1) of the Act, 21 ?U.S.C.section 360bbb-3(b)(1), unless the authorization is terminated  ?or revoked sooner.  ? ? ?  ? Influenza A by PCR NEGATIVE NEGATIVE Final  ? Influenza B by PCR NEGATIVE NEGATIVE Final  ?  Comment: (NOTE) ?The  Xpert Xpress SARS-CoV-2/FLU/RSV plus assay is intended as an aid ?in the diagnosis of influenza from Nasopharyngeal swab specimens and ?should not be used as a sole basis for treatment. Nasal washings and ?aspirates are u

## 2021-03-29 LAB — CULTURE, RESPIRATORY W GRAM STAIN

## 2021-03-29 LAB — ASPERGILLUS ANTIGEN, BAL/SERUM: Aspergillus Ag, BAL/Serum: 0.07 Index (ref 0.00–0.49)

## 2021-03-29 LAB — FUNGITELL, SERUM: Fungitell Result: 38 pg/mL (ref <80)

## 2021-04-01 ENCOUNTER — Inpatient Hospital Stay
Admission: EM | Admit: 2021-04-01 | Discharge: 2021-04-06 | DRG: 871 | Disposition: A | Discharge status: Hospice | Payer: Medicare Other | Attending: Internal Medicine | Admitting: Internal Medicine

## 2021-04-01 ENCOUNTER — Other Ambulatory Visit: Payer: Self-pay

## 2021-04-01 ENCOUNTER — Emergency Department: Payer: Medicare Other

## 2021-04-01 ENCOUNTER — Encounter: Payer: Self-pay | Admitting: Radiology

## 2021-04-01 DIAGNOSIS — J9620 Acute and chronic respiratory failure, unspecified whether with hypoxia or hypercapnia: Secondary | ICD-10-CM | POA: Diagnosis present

## 2021-04-01 DIAGNOSIS — E8809 Other disorders of plasma-protein metabolism, not elsewhere classified: Secondary | ICD-10-CM | POA: Diagnosis present

## 2021-04-01 DIAGNOSIS — R000 Tachycardia, unspecified: Secondary | ICD-10-CM | POA: Diagnosis present

## 2021-04-01 DIAGNOSIS — R1310 Dysphagia, unspecified: Secondary | ICD-10-CM | POA: Diagnosis present

## 2021-04-01 DIAGNOSIS — L89152 Pressure ulcer of sacral region, stage 2: Secondary | ICD-10-CM | POA: Diagnosis present

## 2021-04-01 DIAGNOSIS — J9621 Acute and chronic respiratory failure with hypoxia: Secondary | ICD-10-CM | POA: Diagnosis present

## 2021-04-01 DIAGNOSIS — Z515 Encounter for palliative care: Secondary | ICD-10-CM | POA: Diagnosis not present

## 2021-04-01 DIAGNOSIS — D649 Anemia, unspecified: Secondary | ICD-10-CM | POA: Diagnosis present

## 2021-04-01 DIAGNOSIS — J690 Pneumonitis due to inhalation of food and vomit: Secondary | ICD-10-CM | POA: Diagnosis present

## 2021-04-01 DIAGNOSIS — I110 Hypertensive heart disease with heart failure: Secondary | ICD-10-CM | POA: Diagnosis present

## 2021-04-01 DIAGNOSIS — Z681 Body mass index (BMI) 19 or less, adult: Secondary | ICD-10-CM | POA: Diagnosis not present

## 2021-04-01 DIAGNOSIS — E43 Unspecified severe protein-calorie malnutrition: Secondary | ICD-10-CM | POA: Diagnosis present

## 2021-04-01 DIAGNOSIS — J189 Pneumonia, unspecified organism: Secondary | ICD-10-CM | POA: Diagnosis present

## 2021-04-01 DIAGNOSIS — Z951 Presence of aortocoronary bypass graft: Secondary | ICD-10-CM

## 2021-04-01 DIAGNOSIS — Z66 Do not resuscitate: Secondary | ICD-10-CM | POA: Diagnosis present

## 2021-04-01 DIAGNOSIS — Z881 Allergy status to other antibiotic agents status: Secondary | ICD-10-CM

## 2021-04-01 DIAGNOSIS — Z20822 Contact with and (suspected) exposure to covid-19: Secondary | ICD-10-CM | POA: Diagnosis present

## 2021-04-01 DIAGNOSIS — Z923 Personal history of irradiation: Secondary | ICD-10-CM

## 2021-04-01 DIAGNOSIS — J470 Bronchiectasis with acute lower respiratory infection: Secondary | ICD-10-CM | POA: Diagnosis present

## 2021-04-01 DIAGNOSIS — Z7401 Bed confinement status: Secondary | ICD-10-CM

## 2021-04-01 DIAGNOSIS — Z87891 Personal history of nicotine dependence: Secondary | ICD-10-CM

## 2021-04-01 DIAGNOSIS — C3432 Malignant neoplasm of lower lobe, left bronchus or lung: Secondary | ICD-10-CM | POA: Diagnosis present

## 2021-04-01 DIAGNOSIS — E871 Hypo-osmolality and hyponatremia: Secondary | ICD-10-CM | POA: Diagnosis present

## 2021-04-01 DIAGNOSIS — Z902 Acquired absence of lung [part of]: Secondary | ICD-10-CM

## 2021-04-01 DIAGNOSIS — E860 Dehydration: Secondary | ICD-10-CM | POA: Diagnosis present

## 2021-04-01 DIAGNOSIS — I5023 Acute on chronic systolic (congestive) heart failure: Secondary | ICD-10-CM | POA: Diagnosis present

## 2021-04-01 DIAGNOSIS — Z7189 Other specified counseling: Secondary | ICD-10-CM | POA: Diagnosis not present

## 2021-04-01 DIAGNOSIS — I2510 Atherosclerotic heart disease of native coronary artery without angina pectoris: Secondary | ICD-10-CM | POA: Diagnosis present

## 2021-04-01 DIAGNOSIS — G894 Chronic pain syndrome: Secondary | ICD-10-CM | POA: Diagnosis present

## 2021-04-01 DIAGNOSIS — R6521 Severe sepsis with septic shock: Secondary | ICD-10-CM | POA: Diagnosis present

## 2021-04-01 DIAGNOSIS — Z886 Allergy status to analgesic agent status: Secondary | ICD-10-CM

## 2021-04-01 DIAGNOSIS — Z7902 Long term (current) use of antithrombotics/antiplatelets: Secondary | ICD-10-CM

## 2021-04-01 DIAGNOSIS — Z8249 Family history of ischemic heart disease and other diseases of the circulatory system: Secondary | ICD-10-CM

## 2021-04-01 DIAGNOSIS — J852 Abscess of lung without pneumonia: Secondary | ICD-10-CM | POA: Diagnosis not present

## 2021-04-01 DIAGNOSIS — I739 Peripheral vascular disease, unspecified: Secondary | ICD-10-CM | POA: Diagnosis present

## 2021-04-01 DIAGNOSIS — R32 Unspecified urinary incontinence: Secondary | ICD-10-CM | POA: Diagnosis present

## 2021-04-01 DIAGNOSIS — I252 Old myocardial infarction: Secondary | ICD-10-CM

## 2021-04-01 DIAGNOSIS — Z79899 Other long term (current) drug therapy: Secondary | ICD-10-CM

## 2021-04-01 DIAGNOSIS — R0602 Shortness of breath: Secondary | ICD-10-CM | POA: Diagnosis present

## 2021-04-01 DIAGNOSIS — J851 Abscess of lung with pneumonia: Secondary | ICD-10-CM | POA: Diagnosis present

## 2021-04-01 DIAGNOSIS — Z8261 Family history of arthritis: Secondary | ICD-10-CM

## 2021-04-01 DIAGNOSIS — Z7982 Long term (current) use of aspirin: Secondary | ICD-10-CM

## 2021-04-01 DIAGNOSIS — J120 Adenoviral pneumonia: Secondary | ICD-10-CM | POA: Diagnosis present

## 2021-04-01 DIAGNOSIS — Z801 Family history of malignant neoplasm of trachea, bronchus and lung: Secondary | ICD-10-CM

## 2021-04-01 DIAGNOSIS — Z9221 Personal history of antineoplastic chemotherapy: Secondary | ICD-10-CM

## 2021-04-01 DIAGNOSIS — Z853 Personal history of malignant neoplasm of breast: Secondary | ICD-10-CM

## 2021-04-01 DIAGNOSIS — R627 Adult failure to thrive: Secondary | ICD-10-CM | POA: Diagnosis present

## 2021-04-01 DIAGNOSIS — L8990 Pressure ulcer of unspecified site, unspecified stage: Secondary | ICD-10-CM | POA: Diagnosis present

## 2021-04-01 DIAGNOSIS — R54 Age-related physical debility: Secondary | ICD-10-CM | POA: Diagnosis present

## 2021-04-01 DIAGNOSIS — E785 Hyperlipidemia, unspecified: Secondary | ICD-10-CM | POA: Diagnosis present

## 2021-04-01 DIAGNOSIS — A419 Sepsis, unspecified organism: Secondary | ICD-10-CM | POA: Diagnosis not present

## 2021-04-01 DIAGNOSIS — Z96651 Presence of right artificial knee joint: Secondary | ICD-10-CM | POA: Diagnosis present

## 2021-04-01 DIAGNOSIS — E876 Hypokalemia: Secondary | ICD-10-CM | POA: Diagnosis present

## 2021-04-01 LAB — CBC WITH DIFFERENTIAL/PLATELET
Abs Immature Granulocytes: 0.03 10*3/uL (ref 0.00–0.07)
Basophils Absolute: 0 10*3/uL (ref 0.0–0.1)
Basophils Relative: 0 %
Eosinophils Absolute: 0 10*3/uL (ref 0.0–0.5)
Eosinophils Relative: 0 %
HCT: 26.4 % — ABNORMAL LOW (ref 36.0–46.0)
Hemoglobin: 8.2 g/dL — ABNORMAL LOW (ref 12.0–15.0)
Immature Granulocytes: 0 %
Lymphocytes Relative: 10 %
Lymphs Abs: 0.7 10*3/uL (ref 0.7–4.0)
MCH: 30.3 pg (ref 26.0–34.0)
MCHC: 31.1 g/dL (ref 30.0–36.0)
MCV: 97.4 fL (ref 80.0–100.0)
Monocytes Absolute: 0.3 10*3/uL (ref 0.1–1.0)
Monocytes Relative: 5 %
Neutro Abs: 6 10*3/uL (ref 1.7–7.7)
Neutrophils Relative %: 85 %
Platelets: 171 10*3/uL (ref 150–400)
RBC: 2.71 MIL/uL — ABNORMAL LOW (ref 3.87–5.11)
RDW: 15.6 % — ABNORMAL HIGH (ref 11.5–15.5)
WBC: 7.1 10*3/uL (ref 4.0–10.5)
nRBC: 0 % (ref 0.0–0.2)

## 2021-04-01 LAB — COMPREHENSIVE METABOLIC PANEL
ALT: 12 U/L (ref 0–44)
AST: 30 U/L (ref 15–41)
Albumin: 2.1 g/dL — ABNORMAL LOW (ref 3.5–5.0)
Alkaline Phosphatase: 54 U/L (ref 38–126)
Anion gap: 6 (ref 5–15)
BUN: 34 mg/dL — ABNORMAL HIGH (ref 8–23)
CO2: 21 mmol/L — ABNORMAL LOW (ref 22–32)
Calcium: 7.8 mg/dL — ABNORMAL LOW (ref 8.9–10.3)
Chloride: 104 mmol/L (ref 98–111)
Creatinine, Ser: 1.15 mg/dL — ABNORMAL HIGH (ref 0.44–1.00)
GFR, Estimated: 49 mL/min — ABNORMAL LOW (ref >60)
Glucose, Bld: 93 mg/dL (ref 70–99)
Potassium: 5.3 mmol/L — ABNORMAL HIGH (ref 3.5–5.1)
Sodium: 131 mmol/L — ABNORMAL LOW (ref 135–145)
Total Bilirubin: 0.7 mg/dL (ref 0.3–1.2)
Total Protein: 6.4 g/dL — ABNORMAL LOW (ref 6.5–8.1)

## 2021-04-01 LAB — LACTIC ACID, PLASMA: Lactic Acid, Venous: 1.7 mmol/L (ref 0.5–1.9)

## 2021-04-01 LAB — URINALYSIS, ROUTINE W REFLEX MICROSCOPIC
Bilirubin Urine: NEGATIVE
Glucose, UA: NEGATIVE mg/dL
Ketones, ur: 5 mg/dL — AB
Nitrite: NEGATIVE
Protein, ur: NEGATIVE mg/dL
Specific Gravity, Urine: 1.034 — ABNORMAL HIGH (ref 1.005–1.030)
pH: 6 (ref 5.0–8.0)

## 2021-04-01 LAB — RESP PANEL BY RT-PCR (FLU A&B, COVID) ARPGX2
Influenza A by PCR: NEGATIVE
Influenza B by PCR: NEGATIVE
SARS Coronavirus 2 by RT PCR: NEGATIVE

## 2021-04-01 LAB — PROTIME-INR
INR: 1.4 — ABNORMAL HIGH (ref 0.8–1.2)
Prothrombin Time: 17 seconds — ABNORMAL HIGH (ref 11.4–15.2)

## 2021-04-01 LAB — BRAIN NATRIURETIC PEPTIDE: B Natriuretic Peptide: 291.5 pg/mL — ABNORMAL HIGH (ref 0.0–100.0)

## 2021-04-01 MED ORDER — ENOXAPARIN SODIUM 30 MG/0.3ML IJ SOSY
30.0000 mg | PREFILLED_SYRINGE | INTRAMUSCULAR | Status: DC
  Administered 2021-04-01: 30 mg via SUBCUTANEOUS
  Filled 2021-04-01: qty 0.3

## 2021-04-01 MED ORDER — ASPIRIN EC 81 MG PO TBEC
81.0000 mg | DELAYED_RELEASE_TABLET | Freq: Every day | ORAL | Status: DC
  Administered 2021-04-01 – 2021-04-06 (×6): 81 mg via ORAL
  Filled 2021-04-01 (×6): qty 1

## 2021-04-01 MED ORDER — ATORVASTATIN CALCIUM 20 MG PO TABS
20.0000 mg | ORAL_TABLET | Freq: Every day | ORAL | Status: DC
  Administered 2021-04-01 – 2021-04-05 (×5): 20 mg via ORAL
  Filled 2021-04-01 (×5): qty 1

## 2021-04-01 MED ORDER — ALBUTEROL SULFATE (2.5 MG/3ML) 0.083% IN NEBU: 2.5000 mg | INHALATION_SOLUTION | RESPIRATORY_TRACT | Status: DC | PRN

## 2021-04-01 MED ORDER — CLOPIDOGREL BISULFATE 75 MG PO TABS
75.0000 mg | ORAL_TABLET | Freq: Every day | ORAL | Status: DC
  Administered 2021-04-01 – 2021-04-06 (×6): 75 mg via ORAL
  Filled 2021-04-01 (×6): qty 1

## 2021-04-01 MED ORDER — SODIUM CHLORIDE 0.9 % IV SOLN
2.0000 g | Freq: Once | INTRAVENOUS | Status: AC
  Administered 2021-04-01: 2 g via INTRAVENOUS
  Filled 2021-04-01: qty 2

## 2021-04-01 MED ORDER — SODIUM CHLORIDE 0.9 % IV BOLUS
1000.0000 mL | Freq: Once | INTRAVENOUS | Status: AC
  Administered 2021-04-01: 1000 mL via INTRAVENOUS

## 2021-04-01 MED ORDER — BISACODYL 10 MG RE SUPP
10.0000 mg | Freq: Every day | RECTAL | Status: DC | PRN
  Filled 2021-04-01: qty 1

## 2021-04-01 MED ORDER — SODIUM CHLORIDE 0.9 % IV BOLUS
250.0000 mL | Freq: Once | INTRAVENOUS | Status: AC
  Administered 2021-04-01: 250 mL via INTRAVENOUS

## 2021-04-01 MED ORDER — VANCOMYCIN HCL IN DEXTROSE 1-5 GM/200ML-% IV SOLN
1000.0000 mg | Freq: Once | INTRAVENOUS | Status: AC
  Administered 2021-04-01: 1000 mg via INTRAVENOUS
  Filled 2021-04-01: qty 200

## 2021-04-01 MED ORDER — SODIUM CHLORIDE 0.9 % IV SOLN: 2.0000 g | INTRAVENOUS | Status: DC

## 2021-04-01 MED ORDER — ACETAMINOPHEN 500 MG PO TABS: 1000.0000 mg | ORAL_TABLET | Freq: Four times a day (QID) | ORAL | Status: DC | PRN

## 2021-04-01 MED ORDER — MAGNESIUM HYDROXIDE 400 MG/5ML PO SUSP: 30.0000 mL | Freq: Every day | ORAL | Status: DC | PRN

## 2021-04-01 MED ORDER — VANCOMYCIN HCL 500 MG/100ML IV SOLN: 500.0000 mg | INTRAVENOUS | Status: DC

## 2021-04-01 MED ORDER — MIDODRINE HCL 5 MG PO TABS
5.0000 mg | ORAL_TABLET | Freq: Once | ORAL | Status: AC
  Administered 2021-04-01: 5 mg via ORAL
  Filled 2021-04-01: qty 1

## 2021-04-01 MED ORDER — GABAPENTIN 100 MG PO CAPS
100.0000 mg | ORAL_CAPSULE | Freq: Two times a day (BID) | ORAL | Status: DC
  Administered 2021-04-01 – 2021-04-06 (×10): 100 mg via ORAL
  Filled 2021-04-01 (×10): qty 1

## 2021-04-01 MED ORDER — IOHEXOL 350 MG/ML SOLN
75.0000 mL | Freq: Once | INTRAVENOUS | Status: AC | PRN
  Administered 2021-04-01: 75 mL via INTRAVENOUS

## 2021-04-01 NOTE — ED Triage Notes (Signed)
Pt with complaints of cough and shortness of breath with sudden onset and episode of gasping for air. PT discharged last Wednesday with pneumonia. PT on 4L o2 today as opposed to her normal 2L. Pt being treated for lung cancer. ?

## 2021-04-01 NOTE — H&P (Signed)
?History and Physical  ? ? ?Patient: [name] ZDG:[id number] DOB: 1940/05/15 ?DOA: 04/01/2021 ?DOS: the patient was seen and examined on 04/01/2021 ?PCP: Derinda Late, MD  ?Patient coming from: Home ? ?Chief Complaint:  ?Chief Complaint  ?Patient presents with  ? Shortness of Breath  ? ?HPI: [name] is a 81 y.o. female with medical history significant of stage IIIb squamous cell lung cancer s/p resection of LLL but unable to tolerate 4 rounds of chemotherapy as recommended, thick walled cavitary and posterior left hemithorax thought to be a lung abscess although not yet biopsied, HFrEF and extremely poor premorbid functioning with decreased p.o. intake and protein calorie malnutrition who was discharged 5 days ago after treatment for CAP is now brought back in by family for shortness of breath. ? ?History is given primarily per patient's daughter.  Patient states "talk to her, I will chime in if I need to".  Patient apparently was sent home despite recommendations to go to a SNF.  Patient lives at home alone however patient's daughter Rhea Bleacher and son to him have been taking turns staying with her.  Patient is full care and has been lying in a recliner, unable to get out due to weakness.  Patient also is incontinent of urine and they have done the best they could to keep her clean.  Patient's daughter states that the day after she was discharged home patient had an episode where she clutched her chest and said she was short of breath but would not let her call the ambulance because she did not want to come to the hospital.  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  Patient's daughter called the ambulance. ? ?Patient herself states that she would like to go home.  Discussed goals for care with patient who said that nobody had ever discussed this with her.  She said she refused to go to a SNF or to  hospice.  She said she would want to go home.  Patient's daughter states that she will not allow anybody into the house to help her only her family which includes her daughter, her son and her granddaughter.  Other than that she will not allow anyone to help take care of her.  Patient's daughter states she is at the end of her rope. ? ?Patient admits to intermittent shortness of breath however states she would like to go home.  Patient declines to give any further review of systems.  Patient's daughter states she has had an intermittent cough which is without change.  She also has had extremely decreased p.o. intake with very little to eat or drink over the past 5 days. ? ?Work-up in the ED revealed a patient who was hypotensive and tachycardic.  She was maintaining adequate O2 saturations on her usual 2 L of home O2 that she was discharged with last week.  She was afebrile and had no leukocytosis.  CT angiogram was negative for PE however did show new bud in tree morphology suggestive of bronchiectasis and/or aspiration/infection.  ? ?  ?Review of Systems: As mentioned in the history of present illness. All other systems reviewed and are negative. ?Past Medical History:  ?Diagnosis Date  ? Arthritis   ? Breast cancer [facility]) 2011  ? radiation- Left  ? CHF (congestive heart failure) ([place])   ? Coronary artery disease   ?  Dyspnea   ? Hyperlipidemia   ? Hypertension   ? Myocardial infarction [facility])   ? Personal history of radiation therapy   ? PVD (peripheral vascular disease) ([place])   ? ?Past Surgical History:  ?Procedure Laterality Date  ? ABDOMINAL HYSTERECTOMY    ? APPENDECTOMY    ? BREAST BIOPSY Left 12/21/2009  ? positive, radiation dcis  ? BREAST EXCISIONAL BIOPSY Left 01/17/2010  ? lumpectomy DCIS  ? BREAST LUMPECTOMY    ? CATARACT EXTRACTION W/ INTRAOCULAR LENS IMPLANT & ANTERIOR VITRECTOMY, BILATERAL    ? CHOLECYSTECTOMY    ? COLONOSCOPY WITH PROPOFOL N/A 09/25/2017  ? Procedure: COLONOSCOPY WITH PROPOFOL;   Surgeon: Lollie Sails, MD;  [facility] ENDOSCOPY;  Service: Endoscopy;  Laterality: N/A;  ? CORONARY ARTERY BYPASS GRAFT N/A 05/03/2020  ? Procedure: CORONARY ARTERY BYPASS GRAFTING (CABG) X FOUR ON PUMP USING LEFT INTERNAL MAMMARY ARTERY AND RIGHT ENDOSCOPIC SAPHEOUS VEIN HARVEST CONDUITS;  Surgeon: Melrose Nakayama, MD;  Location: [place];  Service: Open Heart Surgery;  Laterality: N/A;  ? DILATION AND CURETTAGE OF UTERUS    ? EYE SURGERY    ? bilateral cataracts  ? INTERCOSTAL NERVE BLOCK Left 11/13/2020  ? Procedure: INTERCOSTAL NERVE BLOCK;  Surgeon: Melrose Nakayama, MD;  Location: [place];  Service: Thoracic;  Laterality: Left;  ? IR IMAGING GUIDED PORT INSERTION  01/12/2021  ? JOINT REPLACEMENT Right 01/24/2016  ? KNEE ARTHROPLASTY Right 01/24/2016  ? Procedure: COMPUTER ASSISTED TOTAL KNEE ARTHROPLASTY;  Surgeon: Dereck Leep, MD;  Location: [facility] ORS;  Service: Orthopedics;  Laterality: Right;  ? LEFT HEART CATH AND CORONARY ANGIOGRAPHY N/A 05/01/2020  ? Procedure: LEFT HEART CATH AND CORONARY ANGIOGRAPHY;  Surgeon: Wellington Hampshire, MD;  Location: [place] CV LAB;  Service: Cardiovascular;  Laterality: N/A;  ? LUNG LOBECTOMY  10/2020  ? LYMPH NODE DISSECTION Left 11/13/2020  ? Procedure: LYMPH NODE DISSECTION;  Surgeon: Melrose Nakayama, MD;  Location: [facility] OR;  Service: Thoracic;  Laterality: Left;  ? RIGHT/LEFT HEART CATH AND CORONARY/GRAFT ANGIOGRAPHY N/A 07/27/2020  ? Procedure: RIGHT/LEFT HEART CATH AND CORONARY/GRAFT ANGIOGRAPHY;  Surgeon: Wellington Hampshire, MD;  Location: [place] CV LAB;  Service: Cardiovascular;  Laterality: N/A;  ? stent in Lt leg Left   ? TEE WITHOUT CARDIOVERSION N/A 05/03/2020  ? Procedure: TRANSESOPHAGEAL ECHOCARDIOGRAM (TEE);  Surgeon: Melrose Nakayama, MD;  Location: [place];  Service: Open Heart Surgery;  Laterality: N/A;  ? VIDEO BRONCHOSCOPY WITH ENDOBRONCHIAL NAVIGATION N/A 09/25/2020  ? Procedure: VIDEO BRONCHOSCOPY WITH ENDOBRONCHIAL  NAVIGATION;  Surgeon: Melrose Nakayama, MD;  Location: [place];  Service: Thoracic;  Laterality: N/A;  ? ?Social History:  reports that she has quit smoking. She has never used smokeless tobacco. She reports that she does not drink alcohol and does not use drugs. ? ?Allergies  ?Allergen Reactions  ? Ibuprofen Hives  ? Aleve [Naproxen Sodium] Hives  ? Avapro  [Irbesartan] Rash  ? Azithromycin Hives  ? Nickel Itching  ?  Redness, itching and fluid discharge with nickel earrings  ? ? ?Family History  ?Problem Relation Age of Onset  ? Hypertension Mother   ? Arthritis Mother   ? Gout Mother   ? Lung cancer Father   ? Heart attack Sister   ? ? ?Prior to Admission medications   ?Medication Sig Start Date End Date Taking? Authorizing Provider  ?acetaminophen (TYLENOL) 500 MG tablet Take 1,000 mg by mouth every 6 (six) hours as needed for moderate pain  or headache.    [provider]  ?aspirin EC 81 MG tablet Take 81 mg by mouth daily.    [provider]  ?atorvastatin (LIPITOR) 20 MG tablet Take 1 tablet (20 mg total) by mouth at bedtime. 06/22/20   Marrianne Mood D, PA-C  ?Calcium Carbonate-Vit D-Min (CALCIUM 600+D3 PLUS MINERALS) 600-800 MG-UNIT TABS Take 1 tablet by mouth daily.    [provider]  ?cefUROXime (CEFTIN) 500 MG tablet Take 1 tablet (500 mg total) by mouth 2 (two) times daily with a meal. 03/28/21 04/27/21  Bonnielee Haff, MD  ?clopidogrel (PLAVIX) 75 MG tablet Take 1 tablet (75 mg total) by mouth daily. 05/22/20 05/17/21  Marrianne Mood D, PA-C  ?diphenhydrAMINE-zinc acetate (BENADRYL) cream Apply 1 application topically daily as needed for itching.    [provider]  ?furosemide (LASIX) 40 MG tablet Take 1 tablet (40 mg total) by mouth daily. 07/29/20   Terrilee Croak, MD  ?gabapentin (NEURONTIN) 100 MG capsule Take 1 capsule (100 mg total) by mouth 3 (three) times daily. ?Patient taking differently: Take 100 mg by mouth 2 (two) times daily. 12/12/20   Melrose Nakayama, MD  ?lidocaine-prilocaine (EMLA) cream Apply 1 application topically as needed. Apply to port and cover with saran wrap 1-2 hours prior to port access 01/16/21   Lloyd Huger, MD  ?loratadine (C

## 2021-04-01 NOTE — ED Notes (Signed)
This RN changed pt pad and linens d/t urination.  Pt had initially refused purewick and disposable brief d/t skin breakdown pain on buttocks.  When changing pt, redness on buttocks and coccyx area with skin breakdown noted particularly on coccyx. Pt agreed to purewick and disposable brief.  Full linen change, pericare, barrier cream, purewick, and brief provided.  Pt tolerated well with no accelerations in HR or problems with O2 sats.  Pt provided with warm blankets.  Family in room at this time. ?

## 2021-04-01 NOTE — ED Provider Notes (Addendum)
Procedures ? ?Clinical Course as of 04/01/21 1629  ?Sun Apr 01, 2021  ?1458 Lactate normal [PR]  ?1458 Hemoglobin stable no leukocytosis. [PR]  ?  ?Clinical Course User Index ?[PR] Merlyn Lot, MD  ? ? ?----------------------------------------- ?4:29 PM on 04/01/2021 ?----------------------------------------- ?CT negative for PE but does show tree-in-bud opacities suspicious for infectious bronchiolitis.  During recent hospitalization on March 14, patient had Aspergillus antigen and Fungitell test that were within normal limits.  MRSA screen negative.  HIV screen negative. ? ?She is tachycardic with some episodes of hypotension and requires admission to the hospital.  Despite the patient's initial presentation showing normal lactate, no leukocytosis, no fever and not being consistent with sepsis, the evolving picture after CT scan reveals underlying new pneumonia concerning for sepsis, possibly Pseudomonas.  I will give cefepime for now for Pseudomonas coverage. ? ? ?  ?Carrie Mew, MD ?04/01/21 1635 ? ?  ?Carrie Mew, MD ?04/01/21 1641 ? ?

## 2021-04-01 NOTE — Assessment & Plan Note (Deleted)
[id number]'oirseopkjfdsp'gjfsl'gjsld'k ?

## 2021-04-01 NOTE — Progress Notes (Signed)
PHARMACIST - PHYSICIAN COMMUNICATION ? ?CONCERNING:  Enoxaparin (Lovenox) for DVT Prophylaxis  ? ? ?RECOMMENDATION: ?Patient was prescribed enoxaprin 40mg  q24 hours for VTE prophylaxis.  ? Danley Danker Weights  ? 04/01/21 1349  ?Weight: 43.1 kg (95 lb)  ? ? ?Body mass index is 18.55 kg/m?. ? ?Estimated Creatinine Clearance: 27.4 mL/min (A) (by C-G formula based on SCr of 1.15 mg/dL (H)). ? ? ?Based on [place] patient is candidate for  enoxaparin 30mg  every 24 hours based on CrCl <57ml/min & Weight <45kg ? ?DESCRIPTION: ?Pharmacy has adjusted enoxaparin dose per [facility] policy. ? ?Patient is now receiving enoxaparin 30 mg every 24 hours  ? ? ?Lorna Dibble, PharmD ?Clinical Pharmacist  ?04/01/2021 ?6:17 PM ? ?

## 2021-04-01 NOTE — Assessment & Plan Note (Deleted)
Ds;kjfsp[djgosjgalds;kgf ?

## 2021-04-01 NOTE — ED Notes (Signed)
Contacted IPNP [place] due to pt ST/SVT 130-140s and jumping as high as191 momentarily.  Pt O2 sats down to 88 on 3L, Inrceased slolwly to 6L, sats improved to 100. BP down to 73/51(56) up to 94/45(57) without intervention. Morton Amy NP in room as this time for assessment. Orders pending lab results sent at this time. PT NAD and asymptomatic throughout assessment. ?

## 2021-04-01 NOTE — Progress Notes (Addendum)
? ?      CROSS COVER NOTE ? ?NAME: [name] ?MRN: [id number] ?DOB : 08/16/1940 ? ?Called to bedside for reports of hypotension, tachycardia, and hypoxia. On arrival to bedside patient's oxygen saturation was 91% on 6L nasal cannula which is an increase from 3L she has been on today. With repositioning of pulse ox probe oxygen saturation now 100%, unclear if initial reading is accurate. Patient denies increased shortness of breath and RR 18. BP initially reported as 73/51 and now 94/45 without intervention. HR 131 and EKG shows sinus tachycardia. ? ?Plan: ?-  Send 1800 BNP (291.5 down from 611.3 on 03/26/21; EF 20-25% 12/2020) ?-  Midodrine 5 mg PO ?-  250 mL NS bolus ?- Telemetry ?- Continuous pulse oximetry ? ?0610: BP 69/49 MAP 55. 250 mL NS bolus and midodrine ordered. HGB 6.8, will transfuse 1U PRBC. PCCM consulted for evaluation for pressor support. ? ?Briefly discussed goals of care with patient and family at bedside. Confirmed DNR status and inquired if she would like full scope of care and escalation to pressors for BP support if needed or if she'd like to focus on getting home and transitioning to a focus on her comfort. Patient is clear that if not starting pressors would mean a possible decompensation that could lead to death she would like to start pressors. ? ?Neomia Glass MHA, MSN, FNP-BC ?Nurse Practitioner ?[facility] ?[place] ?Pager [phone] ? ?

## 2021-04-01 NOTE — ED Provider Notes (Signed)
? ?[facility] ?Provider Note ? ? ? Event Date/Time  ? First MD Initiated Contact with Patient 04/01/21 1345   ?  (approximate) ? ? ?History  ? ?Shortness of Breath ? ? ?HPI ? ?[name] is a 81 y.o. female with a history of squamous cell lung cancer recent admission the hospital for sepsis and pneumonia or presents to the ED are via EMS with shortness of breath weakness productive cough.  Reportedly hypotensive with EMS.  Does have productive cough.  Did have increased O2 requirement now on 4 L as opposed to her normal 2 ?  ? ? ?Physical Exam  ? ?Triage Vital Signs: ?ED Triage Vitals  ?Enc Vitals Group  ?   BP --   ?   Pulse Rate 04/01/21 1353 (!) 117  ?   Resp 04/01/21 1353 17  ?   Temp --   ?   Temp src --   ?   SpO2 04/01/21 1352 94 %  ?   Weight 04/01/21 1349 95 lb (43.1 kg)  ?   Height 04/01/21 1349 5' (1.524 m)  ?   Head Circumference --   ?   Peak Flow --   ?   Pain Score --   ?   Pain Loc --   ?   Pain Edu? --   ?   Excl. in [place]? --   ? ? ?Most recent vital signs: ?Vitals:  ? 04/01/21 1353 04/01/21 1412  ?Pulse: (!) 117   ?Resp: 17   ?Temp:  97.6 ?F (36.4 ?C)  ?SpO2:    ? ? ? ?Constitutional: Alert, ill appearing, protecting airway ?Eyes: Conjunctivae are normal.  ?Head: Atraumatic. ?Nose: No congestion/rhinnorhea. ?Mouth/Throat: Mucous membranes are moist.   ?Neck: Painless ROM.  ?Cardiovascular:   strong radial pulse, brisk cap refill ?Respiratory: Normal respiratory effort.  No retractions.  ?Gastrointestinal: Soft and nontender.  ?Musculoskeletal:  no deformity ?Neurologic:  MAE spontaneously. No gross focal neurologic deficits are appreciated.  ?Skin:  Skin is warm, dry and intact. No rash noted. ?Psychiatric: Mood and affect are normal. Speech and behavior are normal. ? ? ? ?ED Results / Procedures / Treatments  ? ?Labs ?(all labs ordered are listed, but only abnormal results are displayed) ?Labs Reviewed  ?COMPREHENSIVE METABOLIC PANEL - Abnormal; Notable for the following  components:  ?    Result Value  ? Sodium 131 (*)   ? Potassium 5.3 (*)   ? CO2 21 (*)   ? BUN 34 (*)   ? Creatinine, Ser 1.15 (*)   ? Calcium 7.8 (*)   ? Total Protein 6.4 (*)   ? Albumin 2.1 (*)   ? GFR, Estimated 49 (*)   ? All other components within normal limits  ?CBC WITH DIFFERENTIAL/PLATELET - Abnormal; Notable for the following components:  ? RBC 2.71 (*)   ? Hemoglobin 8.2 (*)   ? HCT 26.4 (*)   ? RDW 15.6 (*)   ? All other components within normal limits  ?CULTURE, BLOOD (ROUTINE X 2)  ?CULTURE, BLOOD (ROUTINE X 2)  ?LACTIC ACID, PLASMA  ?LACTIC ACID, PLASMA  ?PROTIME-INR  ?URINALYSIS, ROUTINE W REFLEX MICROSCOPIC  ? ? ? ?EKG ? ?ED ECG REPORT ?I, Merlyn Lot, the attending physician, personally viewed and interpreted this ECG. ? ? Date: 04/01/2021 ? EKG Time: 14:11 ? Rate: 115 ? Rhythm: sinus ? Axis: left ? Intervals:lbbb ? ST&T Change: diffuse nonspecific st abn ? ? ? ?RADIOLOGY ?Please see ED  Course for my review and interpretation. ? ?I personally reviewed all radiographic images ordered to evaluate for the above acute complaints and reviewed radiology reports and findings.  These findings were personally discussed with the patient.  Please see medical record for radiology report. ? ? ? ?PROCEDURES: ? ?Critical Care performed: No ? ?Procedures ? ? ?MEDICATIONS ORDERED IN ED: ?Medications  ?sodium chloride 0.9 % bolus 1,000 mL (has no administration in time range)  ? ? ? ?IMPRESSION / MDM / ASSESSMENT AND PLAN / ED COURSE  ?I reviewed the triage vital signs and the nursing notes. ?             ?               ? ?Differential diagnosis includes, but is not limited to, Asthma, copd, CHF, pna, ptx, malignancy, Pe, anemia ? ?Complex recent past medical history as described above presents to the ER as described above.  Patient is protecting her airway mildly tachypneic not hypoxic on 2 L nasal cannula she is tachycardic but normotensive here.  Patient did receive IV fluids with EMS.  Blood will be sent  for the above differential due to concern for pneumonia recurrent sepsis.  Given her tachycardia shortness of breath hypertension will order CTA due to concern for possible PE.  Patient be signed out to oncoming physician. ? ?Clinical Course as of 04/01/21 1504  ?Sun Apr 01, 2021  ?1458 Lactate normal [PR]  ?1458 Hemoglobin stable no leukocytosis. [PR]  ?  ?Clinical Course User Index ?[PR] Merlyn Lot, MD  ? ? ? ?FINAL CLINICAL IMPRESSION(S) / ED DIAGNOSES  ? ?Final diagnoses:  ?SOB (shortness of breath)  ? ? ? ?Rx / DC Orders  ? ?ED Discharge Orders   ? ? None  ? ?  ? ? ? ?Note:  This document was prepared using Dragon voice recognition software and may include unintentional dictation errors. ? ?  ?Merlyn Lot, MD ?04/01/21 1504 ? ?

## 2021-04-01 NOTE — Assessment & Plan Note (Deleted)
Gfkjrsopiutrewpmvlds;kgfp; ?

## 2021-04-01 NOTE — Consult Note (Signed)
Pharmacy Antibiotic Note ? [name] is a 81 y.o. 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 intake and protein calorie malnutrition who was discharged 5 days ago after treatment for CAP is now brought back in by family for shortness of breath. Pt admitted on 04/01/2021 with pneumonia.  Pharmacy has been consulted for Vancomycin & Cefepime dosing dosing. ? ?Plan:  ?NOTE:  ?-- PTA pt on Ceftin (cefuroxime) 500mg  PO BID & Flagyl (metronidazole) 500mg  PO BID (3/15>> ?-- Also recently on Vancomycin 500mg  IV q24 during last admission, will start with last regimen and adjust per renal function. ? ?Cefepime 2g IV x1 in ED (3/19 @1700 ); then Cefepime 2g IV q24h at next dosing interval. ?Vancomycin 1 gram IV x 1 ordered in ED;  ?will start with Vancomycin 500 mg IV Q 24h.  ?Goal AUC 400-600. Expected AUC: 593.1, Cmax 33.6, cmin 17.8 ?SCr used: 1.15 (BL 0.73); used TBW (43.1 kg) since < IBW (45.5kg), Vd 0.72  ? ?Height: 5' (152.4 cm) ?Weight: 43.1 kg (95 lb) ?IBW/kg (Calculated) : 45.5 ? ?Temp (24hrs), Avg:97.6 ?F (36.4 ?C), Min:97.6 ?F (36.4 ?C), Max:97.6 ?F (36.4 ?C) ? ?Recent Labs  ?Lab 03/26/21 ?0453 03/27/21 ?5277 03/28/21 ?0400 04/01/21 ?1400  ?WBC 13.2* 11.4* 7.6 7.1  ?CREATININE 0.86 0.98 1.00 1.15*  ?LATICACIDVEN 0.9  --   --  1.7  ?  ?Estimated Creatinine Clearance: 27.4 mL/min (A) (by C-G formula based on SCr of 1.15 mg/dL (H)).   ? ?Allergies  ?Allergen Reactions  ? Ibuprofen Hives  ? Aleve [Naproxen Sodium] Hives  ? Avapro  [Irbesartan] Rash  ? Azithromycin Hives  ? Nickel Itching  ?  Redness, itching and fluid discharge with nickel earrings  ? ? ?Antimicrobials this admission: ?Prior admit: ?- Doxycycline (3/8-3/9) ?- Ceftriaxone (3/8-3/10) ?-  Vancomycin (3/8-3/10); (3/12-3/13) ?- Unasyn (3/10-3/12) ?Discharged on:  ?- Ceftin/Flagyl (3/15-3/19 d/t admission) ?Current Admit: ?+ Vancomycin (3/19 ?+ Cefepime (3/19>> ? ?Dose adjustments this admission: ?CTM and adjust PRN. Pt is elevated above BL Scr. ? ?Microbiology results: ?3/19 BCx: sent ? ?Thank you for allowing pharmacy to be a part of this patient?s care. ? ?[name] ?04/01/2021 7:10 PM ? ?

## 2021-04-02 ENCOUNTER — Other Ambulatory Visit: Payer: Self-pay

## 2021-04-02 ENCOUNTER — Ambulatory Visit: Payer: Medicare Other | Admitting: Physician Assistant

## 2021-04-02 DIAGNOSIS — J189 Pneumonia, unspecified organism: Secondary | ICD-10-CM | POA: Diagnosis not present

## 2021-04-02 DIAGNOSIS — J9621 Acute and chronic respiratory failure with hypoxia: Secondary | ICD-10-CM | POA: Diagnosis not present

## 2021-04-02 DIAGNOSIS — C3432 Malignant neoplasm of lower lobe, left bronchus or lung: Secondary | ICD-10-CM | POA: Diagnosis not present

## 2021-04-02 LAB — COMPREHENSIVE METABOLIC PANEL
ALT: 14 U/L (ref 0–44)
AST: 48 U/L — ABNORMAL HIGH (ref 15–41)
Albumin: 1.7 g/dL — ABNORMAL LOW (ref 3.5–5.0)
Alkaline Phosphatase: 46 U/L (ref 38–126)
Anion gap: 5 (ref 5–15)
BUN: 23 mg/dL (ref 8–23)
CO2: 24 mmol/L (ref 22–32)
Calcium: 7.6 mg/dL — ABNORMAL LOW (ref 8.9–10.3)
Chloride: 103 mmol/L (ref 98–111)
Creatinine, Ser: 0.91 mg/dL (ref 0.44–1.00)
GFR, Estimated: >60 mL/min (ref >60)
Glucose, Bld: 99 mg/dL (ref 70–99)
Potassium: 4.1 mmol/L (ref 3.5–5.1)
Sodium: 132 mmol/L — ABNORMAL LOW (ref 135–145)
Total Bilirubin: 0.5 mg/dL (ref 0.3–1.2)
Total Protein: 5.6 g/dL — ABNORMAL LOW (ref 6.5–8.1)

## 2021-04-02 LAB — PROCALCITONIN: Procalcitonin: 0.44 ng/mL

## 2021-04-02 LAB — PREPARE RBC (CROSSMATCH)

## 2021-04-02 LAB — CBC
HCT: 21.7 % — ABNORMAL LOW (ref 36.0–46.0)
Hemoglobin: 6.8 g/dL — ABNORMAL LOW (ref 12.0–15.0)
MCH: 30.2 pg (ref 26.0–34.0)
MCHC: 31.3 g/dL (ref 30.0–36.0)
MCV: 96.4 fL (ref 80.0–100.0)
Platelets: 148 10*3/uL — ABNORMAL LOW (ref 150–400)
RBC: 2.25 MIL/uL — ABNORMAL LOW (ref 3.87–5.11)
RDW: 15.9 % — ABNORMAL HIGH (ref 11.5–15.5)
WBC: 7.7 10*3/uL (ref 4.0–10.5)
nRBC: 0 % (ref 0.0–0.2)

## 2021-04-02 LAB — HEMOGLOBIN AND HEMATOCRIT, BLOOD
HCT: 27.3 % — ABNORMAL LOW (ref 36.0–46.0)
Hemoglobin: 8.8 g/dL — ABNORMAL LOW (ref 12.0–15.0)

## 2021-04-02 LAB — MRSA NEXT GEN BY PCR, NASAL: MRSA by PCR Next Gen: NOT DETECTED

## 2021-04-02 LAB — GLUCOSE, CAPILLARY: Glucose-Capillary: 112 mg/dL — ABNORMAL HIGH (ref 70–99)

## 2021-04-02 LAB — EXPECTORATED SPUTUM ASSESSMENT W GRAM STAIN, RFLX TO RESP C

## 2021-04-02 MED ORDER — ALBUMIN HUMAN 25 % IV SOLN
12.5000 g | Freq: Once | INTRAVENOUS | Status: DC
  Filled 2021-04-02: qty 50

## 2021-04-02 MED ORDER — SODIUM CHLORIDE 0.9% IV SOLUTION
Freq: Once | INTRAVENOUS | Status: AC
  Filled 2021-04-02: qty 250

## 2021-04-02 MED ORDER — NOREPINEPHRINE 4 MG/250ML-% IV SOLN
2.0000 ug/min | INTRAVENOUS | Status: DC
  Administered 2021-04-02: 4 ug/min via INTRAVENOUS
  Filled 2021-04-02: qty 250

## 2021-04-02 MED ORDER — PIPERACILLIN-TAZOBACTAM 3.375 G IVPB
3.3750 g | Freq: Three times a day (TID) | INTRAVENOUS | Status: DC
  Administered 2021-04-02 – 2021-04-04 (×7): 3.375 g via INTRAVENOUS
  Filled 2021-04-02 (×7): qty 50

## 2021-04-02 MED ORDER — CHLORHEXIDINE GLUCONATE CLOTH 2 % EX PADS
6.0000 | MEDICATED_PAD | Freq: Every day | CUTANEOUS | Status: DC
  Administered 2021-04-02 – 2021-04-06 (×5): 6 via TOPICAL

## 2021-04-02 MED ORDER — MIDODRINE HCL 5 MG PO TABS
5.0000 mg | ORAL_TABLET | Freq: Three times a day (TID) | ORAL | Status: DC
  Administered 2021-04-02 – 2021-04-03 (×5): 5 mg via ORAL
  Filled 2021-04-02 (×5): qty 1

## 2021-04-02 MED ORDER — SODIUM CHLORIDE 0.9 % IV SOLN
2.0000 g | Freq: Two times a day (BID) | INTRAVENOUS | Status: DC
  Filled 2021-04-02: qty 2

## 2021-04-02 MED ORDER — SODIUM CHLORIDE 0.9 % IV SOLN
250.0000 mL | INTRAVENOUS | Status: DC
  Administered 2021-04-02: 250 mL via INTRAVENOUS

## 2021-04-02 MED ORDER — SODIUM CHLORIDE 0.9 % IV SOLN: 250.0000 mL | INTRAVENOUS | Status: DC

## 2021-04-02 MED ORDER — SODIUM CHLORIDE 0.9 % IV BOLUS
250.0000 mL | Freq: Once | INTRAVENOUS | Status: AC
  Administered 2021-04-02: 250 mL via INTRAVENOUS

## 2021-04-02 MED ORDER — SODIUM CHLORIDE 0.9 % IV SOLN
100.0000 mg | Freq: Two times a day (BID) | INTRAVENOUS | Status: DC
  Administered 2021-04-02 – 2021-04-03 (×3): 100 mg via INTRAVENOUS
  Filled 2021-04-02 (×4): qty 100

## 2021-04-02 MED ORDER — NOREPINEPHRINE 4 MG/250ML-% IV SOLN
0.0000 ug/min | INTRAVENOUS | Status: DC
  Administered 2021-04-02: 2 ug/min via INTRAVENOUS
  Filled 2021-04-02: qty 250

## 2021-04-02 MED ORDER — MIDODRINE HCL 5 MG PO TABS
10.0000 mg | ORAL_TABLET | Freq: Once | ORAL | Status: AC
  Administered 2021-04-02: 10 mg via ORAL
  Filled 2021-04-02: qty 2

## 2021-04-02 NOTE — Progress Notes (Signed)
Admission profile updated. ?

## 2021-04-02 NOTE — Progress Notes (Signed)
?PROGRESS NOTE ? ? ? ?[name]  GMW:[id number] DOB: May 05, 1940 DOA: 04/01/2021 ?PCP: Derinda Late, MD  ? ?Brief Narrative:  ?This 81 years old female with PMH significant for stage IIIb squamous cell lung cancer status postresection of left lower lobe but unable to tolerate 4 rounds of chemotherapy as recommended, thick-walled cavitary and posterior left hemithorax thought to be a lung abscess although not yet biopsied, HFrEF and extremely poor premorbid functioning with decreased p.o. intake and protein calorie malnutrition who was discharged 5 days ago after treatment for CAP is now brought in the ED by family for shortness of breath. ?Patient was hypotensive and tachycardic in the ED, CTA chest negative for PE but shows findings suggestive of bronchiectasis and or aspiration/infection. ?Patient is admitted for acute on chronic hypoxic respiratory failure secondary to new bronchiectasis / possible aspiration pneumonia and stage III lung cancer.  She remained hypotensive requiring pressor support.  Patient is admitted in the stepdown, PCCM consulted. ? ?Assessment & Plan: ?  ?Principal Problem: ?  Acute on chronic respiratory failure ([place]) ?Active Problems: ?  Community acquired pneumonia complicated by underlying lung cancer, apparent lung abscess on CT Chest  ?  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  S/P CABG x 4 ?  Sinus tachycardia ?  Pressure injury of skin ? ?Acute on chronic hypoxic respiratory failure: ?Likely secondary to new bronchiectasis / possible aspiration in a patient with extremely poor respiratory reserve given left lower lobe resection and known cavitary lesion in the left posterior hemithorax. ?Continue IV antibiotics (cefepime and vancomycin) ?CTA chest negative for PE but shows new bronchiectasis. ?We will consult ID for further recommendation about antibiotics. ?Continue supplemental oxygen, continue bronchodilators. ?Aspiration precautions.  Follow-up  blood cultures. ? 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 ?Patient was given fluid bolus twice, blood pressure remains low. ?Patient moved to stepdown and started on Levophed support. ?Hemoglobin 6.8,  transfuse 1unit PRBC.  Continue to monitor H&H. ?Continue empiric antibiotic vancomycin and cefepime.  Follow blood cultures ?Hold antihypertensive medications. ?Continue midodrine. ? ?Chronic systolic heart failure: ?Continue telemetry monitoring. ?Hold Toprol, Lasix, Entresto. ?Last echocardiogram showed LVEF 20 to 25%. ? ?Normochromic normocytic anemia.: ?Transfuse 1 unit PRBC.  No signs of any visible bleeding. ?Follow posttransfusion CBC. ? ?Hyperlipidemia: ?Continue atorvastatin. ? ?CAD: ?Continue aspirin and Plavix ? ?Chronic pain syndrome: ?Continue gabapentin ? ?Squamous cell carcinoma of the lung: ?Continue outpatient follow-up with oncology ? ?Social situation: ?Patient has previously refused SNF placement and has demanded to go home and only allowed her family members to care for her even though she is bedbound.  Family is unable to provide this level of care for at home.  TOC notified ? ? ?Pressure Injury 03/22/21 Sacrum Mid Stage 2 -  Partial thickness loss of dermis presenting as a shallow open injury with a red, pink wound bed without slough. (Active)  ?03/22/21 1300  ?Location: Sacrum  ?Location Orientation: Mid  ?Staging: Stage 2 -  Partial thickness loss of dermis presenting as a shallow open injury with a red, pink wound bed without slough.  ?Wound Description (Comments):   ?Present on Admission: Yes  ? ? ?DVT prophylaxis: Lovenox ?Code Status: DNR ?Family Communication: Daughter at bedside ?Disposition Plan:  ? ?Status is: Inpatient ?Remains inpatient appropriate because: Admitted for severe sepsis and acute on chronic hypoxic respiratory failure secondary to new bronchiectasis.  Continue IV antibiotic pulmonology  and  ID consulted ?  ? ?Consultants:  ?ID ?Pulmonology ? ?Procedures: CTA chest ?Antimicrobials: ?Anti-infectives (From admission, onward)  ? ? Start     Dose/Rate Route Frequency Ordered Stop  ? 04/02/21 1900  vancomycin (VANCOREADY) IVPB 500 mg/100 mL  Status:  Discontinued       ? 500 mg ?100 mL/hr over 60 Minutes Intravenous Every 24 hours 04/01/21 2159 04/02/21 1026  ? 04/02/21 1700  ceFEPIme (MAXIPIME) 2 g in sodium chloride 0.9 % 100 mL IVPB  Status:  Discontinued       ? 2 g ?200 mL/hr over 30 Minutes Intravenous Every 24 hours 04/01/21 2159 04/02/21 0842  ? 04/02/21 1400  piperacillin-tazobactam (ZOSYN) IVPB 3.375 g       ? 3.375 g ?12.5 mL/hr over 240 Minutes Intravenous Every 8 hours 04/02/21 1027    ? 04/02/21 1115  doxycycline (VIBRAMYCIN) 100 mg in sodium chloride 0.9 % 250 mL IVPB       ? 100 mg ?125 mL/hr over 120 Minutes Intravenous 2 times daily 04/02/21 1027 04/07/21 0959  ? 04/02/21 1000  ceFEPIme (MAXIPIME) 2 g in sodium chloride 0.9 % 100 mL IVPB  Status:  Discontinued       ? 2 g ?200 mL/hr over 30 Minutes Intravenous Every 12 hours 04/02/21 0842 04/02/21 1026  ? 04/01/21 1845  vancomycin (VANCOCIN) IVPB 1000 mg/200 mL premix       ? 1,000 mg ?200 mL/hr over 60 Minutes Intravenous  Once 04/01/21 1835 04/01/21 2039  ? 04/01/21 1645  ceFEPIme (MAXIPIME) 2 g in sodium chloride 0.9 % 100 mL IVPB       ? 2 g ?200 mL/hr over 30 Minutes Intravenous  Once 04/01/21 1639 04/01/21 1740  ? ?  ?  ? ?Subjective: ?Patient was seen and examined at bedside.  Overnight events noted. ?Patient remains on oxygen and states she is feeling better, following commands. ? ?Objective: ?Vitals:  ? 04/02/21 1357 04/02/21 1400 04/02/21 1500 04/02/21 1600  ?BP: (!) 134/49 (!) 136/57 (!) 121/53 116/79  ?Pulse: 65 62 89 91  ?Resp: (!) 22 18 (!) 25 (!) 23  ?Temp: 97.7 ?F (36.5 ?C)     ?TempSrc: Oral     ?SpO2: 100% 100% 100% 100%  ?Weight:      ?Height:      ? ? ?Intake/Output Summary (Last 24 hours) at 04/02/2021 1628 ?Last  data filed at 04/02/2021 1357 ?Gross per 24 hour  ?Intake 1452.81 ml  ?Output 60 ml  ?Net 1392.81 ml  ? ?Filed Weights  ? 04/01/21 1349 04/02/21 0828  ?Weight: 43.1 kg 42.6 kg  ? ? ?Examination: ? ?General exam: Appears comfortable, frail, thin built, not in any distress, remains on oxygen. ?Respiratory system: CTA bilaterally, no wheezing, no crackles, normal respiratory effort. ?Cardiovascular system: S1-S2 heard, regular rate and rhythm, no murmur. ?Gastrointestinal system: Abdomen is soft, non tender, non distended, BS+ ?Central nervous system: Alert and oriented x 2. No focal neurological deficits. ?Extremities: No edema, no cyanosis, no clubbing. ?Skin: No rashes, lesions or ulcers ?Psychiatry: Judgement and insight appear normal. Mood & affect appropriate.  ? ? ? ?Data Reviewed: I have personally reviewed following labs and imaging studies ? ?CBC: ?Recent Labs  ?Lab 03/27/21 ?3016 03/28/21 ?0400 04/01/21 ?1400 04/02/21 ?0541  ?WBC 11.4* 7.6 7.1 7.7  ?NEUTROABS  --   --  6.0  --   ?HGB 8.9* 8.5* 8.2* 6.8*  ?HCT 27.1* 26.1* 26.4* 21.7*  ?MCV 93.4 94.2 97.4 96.4  ?PLT 165  169 171 148*  ? ?Basic Metabolic Panel: ?Recent Labs  ?Lab 03/27/21 ?3837 03/28/21 ?0400 04/01/21 ?1400 04/02/21 ?0541  ?NA 131* 130* 131* 132*  ?K 3.8 3.9 5.3* 4.1  ?CL 99 99 104 103  ?CO2 26 31 21* 24  ?GLUCOSE 102* 93 93 99  ?BUN 13 13 34* 23  ?CREATININE 0.98 1.00 1.15* 0.91  ?CALCIUM 8.0* 7.9* 7.8* 7.6*  ?MG 1.7  --   --   --   ? ?GFR: ?Estimated Creatinine Clearance: 34.3 mL/min (by C-G formula based on SCr of 0.91 mg/dL). ?Liver Function Tests: ?Recent Labs  ?Lab 04/01/21 ?1400 04/02/21 ?0541  ?AST 30 48*  ?ALT 12 14  ?ALKPHOS 54 46  ?BILITOT 0.7 0.5  ?PROT 6.4* 5.6*  ?ALBUMIN 2.1* 1.7*  ? ?No results for input(s): LIPASE, AMYLASE in the last 168 hours. ?No results for input(s): AMMONIA in the last 168 hours. ?Coagulation Profile: ?Recent Labs  ?Lab 04/01/21 ?1400  ?INR 1.4*  ? ?Cardiac Enzymes: ?No results for input(s): CKTOTAL, CKMB,  CKMBINDEX, TROPONINI in the last 168 hours. ?BNP (last 3 results) ?No results for input(s): PROBNP in the last 8760 hours. ?HbA1C: ?No results for input(s): HGBA1C in the last 72 hours. ?CBG: ?Recent Labs  ?

## 2021-04-02 NOTE — Consult Note (Signed)
? ?NAME:  [name], MRN:  [id number], DOB:  03/15/1940, LOS: 1 ?ADMISSION DATE:  04/01/2021, CONSULTATION DATE:  04/02/21 ?REFERRING MD:  Morton Amy, NP, CHIEF COMPLAINT: Shortness of Breath  ? ?History of Present Illness:  ?81 yo F presenting [facility] ED on 04/01/21 with complaints of shortness of breath, weakness and a productive cough. Per ED documentation, EMS reported on arrival the patient was hypotensive and requiring an increase in supplemental oxygen from chronic 2 L [place] to 4 L [place]. ? ?Of note patient was recently hospitalized from 03/21/21-03/28/21 with Acute on chronic HFrEF & acute hypoxic respiratory failure & sepsis s/t multi-lobar pneumonia and possible lung abscess. ID consulted treating the patient with antibiotics to hopefully avoid bronchoscopy. She was discharge on cefuroxime & metronidazole. ? ?ED course: ?Concern for sepsis with CTa negative for PE but showing new bud in tree morphology suggestive of bronchiectasis vs aspiration/infection. She was initially hypotensive and tachycardic, but stabilized with SpO2 holding on nasal cannula support. TRH consulted for admission. ?Chemistry: Na+:132, K+: 4.1, BUN/Cr.: 23/0.91, Serum CO2/ AG: 24/5 ?Hematology: WBC: 7.7, Hgb: 6.8,  ?BNP: 291.5, Lactic: 1.7, COVID-19 & Influenza A/B: negative ? ?CXR 04/01/21: Improved LEFT LOWER lung consolidation Lonia Chimera /atelectasis. Unchanged trace LEFT pleural effusion. ?CT angio 04/01/21: No pulmonary embolus. New tree-in-bud opacities in the right lung typical of bronchiolitis, likely infectious. Previous left lung consolidation anteriorly is improving, although slight worsening in the dependent left lung. Thick-walled air-fluid collection in the posterior left ?hemithorax is not significantly changed from CT 11 days ago. This may represent sequela of prior lobectomy or pulmonary abscess. Similar prominent mediastinal and hilar nodes, recommend attention at follow-up. ? ?Overnight patient became hypotensive, received 250  mL bolus of fluid with improvement then subsequently her SBP dropped into the 60's. Hgb had also dropped this AM to 6.8 and blood transfusion was ordered by primary service. ?PCCM consulted for additional management due to circulatory shock requiring vasopressor administration. ? ?Pertinent  Medical History  ?Stage IIIb squamous cell lung cancer s/p resection of LLL (unable to tolerate 4 rounds of chemo as recommended) ?HFrEF ?Breast Cancer ?CAD ?HLD ?HTN ?MI ?PVD ?Significant Hospital Events: ?Including procedures, antibiotic start and stop dates in addition to other pertinent events   ?03/20: Pt initially admitted to the [facility] unit with acute on chronic hypoxic respiratory failure secondary to new bronchiectasis/possible aspiration.  However, she became hypotensive hgb 6.8 requiring 1 unit of pRBC's and levophed gtt  ? ?Interim History / Subjective:  ?Pt resting in bed in no acute distress currently requiring levophed gtt @3  mcg/min to maintain map >65 ? ?Objective   ?Blood pressure (!) 80/61, pulse 98, temperature 97.6 ?F (36.4 ?C), temperature source Oral, resp. rate (!) 23, height 5' (1.524 m), weight 43.1 kg, SpO2 100 %. ?   ?   ? ?Intake/Output Summary (Last 24 hours) at 04/02/2021 0612 ?Last data filed at 04/01/2021 1740 ?Gross per 24 hour  ?Intake 1100 ml  ?Output --  ?Net 1100 ml  ? ?Filed Weights  ? 04/01/21 1349  ?Weight: 43.1 kg  ? ? ?Examination: ?General: Adult female, critically ill, lying in bed, NAD ?HEENT: MM pink/dry, anicteric, atraumatic, neck supple ?Neuro: A&O x 3, able to follow commands, PERRL +3, MAE ?CV: s1s2 RRR, NSR on monitor, no r/m/g ?Pulm: Regular, non labored on 3 L [place], breath sounds diminished throughout ?GI: soft, rounded, non tender, bs x 4 ?Skin: limited exam- no rashes/lesions noted ?Extremities: warm/dry, pulses + 2 R/P, no edema noted ? ?  Resolved Hospital Problem list   ? ? ?Assessment & Plan:  ?Acute on chronic hypoxic respiratory failure secondary to new  bronchiectasis/ ?possible aspiration and stage 3 lung cancer  ?  Recently hospitalized from 03/21/21-03/28/21 with acute on chronic HFrEF & acute hypoxic respiratory failure & sepsis s/t multi-lobar pneumonia and possible lung abscess.  ID consulted at that time pt initially treated with vancomycin, ceftriaxone, and metronidazole but than narrowed to vancomycin and unasyn.  Discharge abx therapy cefuroxime and metronidazole  ?- Supplemental O2 for dyspnea and/or hypoxia  ?- Continue prn bronchodilatory therapy ?- Trend WBC and monitor fever cure ?- Trend PCT  ?- Follow cultures ?- Continue Cefepime and vancomycin for now; if MRSA PCR negative can consider discontinuing vancomycin  ?- Repeat CT Chest today still shows possible lung abscess will consult ID  ?- Aspiration precautions  ? ?Hypotension secondary anemia and possible sepsis  ?Hx: HTN  ?- Continuous telemetry monitoring ?- Transfuse 1 unit of pRBC's and prn levophed gtt to maintain map >65 ?- Hold outpatient antihypertensives  ? ?Chronic systolic heart failure-stable  ?- Continuous telemetry monitoring  ?- Hold outpatient diuretic therapy for now due to hypotension  ? ?HLD ?- Continue outpatient atorvastatin  ? ?Severe-protein calorie malnutrition  ?- Continue current diet and encourage po intake  ? ?Best Practice (right click and "Reselect all SmartList Selections" daily)  ?Diet/type: dysphagia diet (see orders) ?DVT prophylaxis: SCD ?GI prophylaxis: N/A ?Lines: Pt with portacath  ?Foley:  N/A ?Code Status:  DNR ?Last date of multidisciplinary goals of care discussion [04/02/21] ? ?Pts family at bedside and updated regarding plan of care all questions answered.  Palliative Care consulted to discuss goals of treatment  ?Labs   ?CBC: ?Recent Labs  ?Lab 03/27/21 ?0520 03/28/21 ?0400 04/01/21 ?1400 04/01/21 ?1929 04/02/21 ?0541  ?WBC 11.4* 7.6 7.1 8.9 7.7  ?NEUTROABS  --   --  6.0  --   --   ?HGB 8.9* 8.5* 8.2* 7.6* 6.8*  ?HCT 27.1* 26.1* 26.4* 24.4* 21.7*  ?MCV  93.4 94.2 97.4 97.6 96.4  ?PLT 165 169 171 168 148*  ? ? ?Basic Metabolic Panel: ?Recent Labs  ?Lab 03/27/21 ?0520 03/28/21 ?0400 04/01/21 ?1400 04/01/21 ?1929 04/02/21 ?0541  ?NA 131* 130* 131*  --  132*  ?K 3.8 3.9 5.3*  --  4.1  ?CL 99 99 104  --  103  ?CO2 26 31 21*  --  24  ?GLUCOSE 102* 93 93  --  99  ?BUN 13 13 34*  --  23  ?CREATININE 0.98 1.00 1.15* 1.05* 0.91  ?CALCIUM 8.0* 7.9* 7.8*  --  7.6*  ?MG 1.7  --   --   --   --   ? ?GFR: ?Estimated Creatinine Clearance: 34.7 mL/min (by C-G formula based on SCr of 0.91 mg/dL). ?Recent Labs  ?Lab 03/28/21 ?0400 04/01/21 ?1400 04/01/21 ?1929 04/02/21 ?0541  ?WBC 7.6 7.1 8.9 7.7  ?LATICACIDVEN  --  1.7  --   --   ? ? ?Liver Function Tests: ?Recent Labs  ?Lab 04/01/21 ?1400 04/02/21 ?0541  ?AST 30 48*  ?ALT 12 14  ?ALKPHOS 54 46  ?BILITOT 0.7 0.5  ?PROT 6.4* 5.6*  ?ALBUMIN 2.1* 1.7*  ? ?No results for input(s): LIPASE, AMYLASE in the last 168 hours. ?No results for input(s): AMMONIA in the last 168 hours. ? ?ABG ?   ?Component Value Date/Time  ? PHART 7.388 11/09/2020 1116  ? PCO2ART 42.9 11/09/2020 1116  ? PO2ART 91.8 11/09/2020 1116  ?  HCO3 25.2 11/09/2020 1116  ? TCO2 23 05/03/2020 2243  ? ACIDBASEDEF 1.6 07/24/2020 2245  ? O2SAT 97.1 11/09/2020 1116  ?  ? ?Coagulation Profile: ?Recent Labs  ?Lab 04/01/21 ?1400  ?INR 1.4*  ? ? ?Cardiac Enzymes: ?No results for input(s): CKTOTAL, CKMB, CKMBINDEX, TROPONINI in the last 168 hours. ? ?HbA1C: ?Hgb A1c MFr Bld  ?Date/Time Value Ref Range Status  ?05/03/2020 01:55 AM 5.6 4.8 - 5.6 % Final  ?  Comment:  ?  (NOTE) ?Pre diabetes:          5.7%-6.4% ? ?Diabetes:              >6.4% ? ?Glycemic control for   <7.0% ?adults with diabetes ?  ?05/01/2020 05:55 AM 5.7 (H) 4.8 - 5.6 % Final  ?  Comment:  ?  (NOTE) ?        Prediabetes: 5.7 - 6.4 ?        Diabetes: >6.4 ?        Glycemic control for adults with diabetes: <7.0 ?  ? ? ?CBG: ?No results for input(s): GLUCAP in the last 168 hours. ? ?Review of Systems: Positives in BOLD    ?Gen: Denies fever, chills, weight change, fatigue, night sweats ?HEENT: Denies blurred vision, double vision, hearing loss, tinnitus, sinus congestion, rhinorrhea, sore throat, neck stiffness, dysphagia ?PULM: shortne

## 2021-04-02 NOTE — Consult Note (Signed)
?  [facility] for Infectious Disease  Total days of antibiotics 2 ?Reason for Consult:pulmonary abscess    ?Referring Physician: Dwyane Dee ? ?Principal Problem: ?  Acute on chronic respiratory failure ([place]) ?Active Problems: ?  S/P CABG x 4 ?  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  Community acquired pneumonia complicated by underlying lung cancer, apparent lung abscess on CT Chest  ?  Sinus tachycardia ?  Pressure injury of skin ? ? ? ?HPI: [name] is a 81 y.o. female  wth hx of CAD s/p CABG 2022, primary squamous cell ca of Left lung s/p lobectomy 2022, who has paused chemo due to poor health status. Unintentional weight loss, worsening shortness of breath. Admitted for FTT, hypoxia. Found to have new cavitary abscess LUL on CT, feels better since admission. Started on vanco/ceftriaxone. Afebrile. Hx of PCN allergy listed but she doesn't recall. Productive cough and was originally admitted on 3/09 and discharged on oral abtx on 3/15. She was readmitted on 3/19 for worsening shortness of breath, with increase in her supplemental oxygenation from 2L up to 4L . Also found to have tachycardia, hypotension. Her chest CTA negative for PE but has possible aspiration.she was started on vancomycin and cefepime. Her labs showed wbc of 7 with 85% N (which was decreased from last admission of 11.4K) ? ?Repeat CT:  ? ?1. No pulmonary embolus. ?2. New tree-in-bud opacities in the right lung typical of ?bronchiolitis, likely infectious. ?3. Previous left lung consolidation anteriorly is improving, ?although slight worsening in the dependent left lung. ?4. Thick-walled air-fluid collection in the posterior left ?hemithorax is not significantly changed from CT 11 days ago. This ?may represent sequela of prior lobectomy or pulmonary abscess. ?5. Similar prominent mediastinal and hilar nodes, recommend ?attention at follow-up. ?Past Medical History:  ?Diagnosis Date  ? Arthritis   ? Breast  cancer [facility]) 2011  ? radiation- Left  ? CHF (congestive heart failure) ([place])   ? Coronary artery disease   ? Dyspnea   ? Hyperlipidemia   ? Hypertension   ? Myocardial infarction [facility])   ? Personal history of radiation therapy   ? PVD (peripheral vascular disease) ([place])   ? ? ?Allergies:  ?Allergies  ?Allergen Reactions  ? Ibuprofen Hives  ? Aleve [Naproxen Sodium] Hives  ? Avapro  [Irbesartan] Rash  ? Azithromycin Hives  ? Nickel Itching  ?  Redness, itching and fluid discharge with nickel earrings  ? ?MEDICATIONS: ? aspirin EC  81 mg Oral Daily  ? atorvastatin  20 mg Oral QHS  ? Chlorhexidine Gluconate Cloth  6 each Topical Q0600  ? clopidogrel  75 mg Oral Daily  ? gabapentin  100 mg Oral BID  ? midodrine  5 mg Oral TID WC  ? ? ?Social History  ? ?Tobacco Use  ? Smoking status: Former  ? Smokeless tobacco: Never  ?Vaping Use  ? Vaping Use: Never used  ?Substance Use Topics  ? Alcohol use: No  ?  Alcohol/week: 0.0 standard drinks  ? Drug use: No  ? ? ?Family History  ?Problem Relation Age of Onset  ? Hypertension Mother   ? Arthritis Mother   ? Gout Mother   ? Lung cancer Father   ? Heart attack Sister   ? ? ?Review of Systems -  ? ?Constitutional: Negative for fever, chills, diaphoresis, activity change, appetite change, fatigue and unexpected weight change.  ?HENT: Negative for congestion, sore throat, rhinorrhea, sneezing, trouble swallowing  and sinus pressure.  ?Eyes: Negative for photophobia and visual disturbance.  ?Respiratory: Negative for cough, chest tightness, shortness of breath, wheezing and stridor.  ?Cardiovascular: Negative for chest pain, palpitations and leg swelling.  ?Gastrointestinal: Negative for nausea, vomiting, abdominal pain, diarrhea, constipation, blood in stool, abdominal distention and anal bleeding.  ?Genitourinary: Negative for dysuria, hematuria, flank pain and difficulty urinating.  ?Musculoskeletal: Negative for myalgias, back pain, joint swelling, arthralgias and gait problem.   ?Skin: Negative for color change, pallor, rash and wound.  ?Neurological: Negative for dizziness, tremors, weakness and light-headedness.  ?Hematological: Negative for adenopathy. Does not bruise/bleed easily.  ?Psychiatric/Behavioral: Negative for behavioral problems, confusion, sleep disturbance, dysphoric mood, decreased concentration and agitation.  ? ? ?OBJECTIVE: ?Temp:  [97.5 ?F (36.4 ?C)-97.7 ?F (36.5 ?C)] 97.7 ?F (36.5 ?C) (03/20 1357) ?Pulse Rate:  [62-131] 97 (03/20 1630) ?Resp:  [14-26] 21 (03/20 1630) ?BP: (76-136)/(34-79) 123/59 (03/20 1630) ?SpO2:  [90 %-100 %] 100 % (03/20 1630) ?Weight:  [42.6 kg] 42.6 kg (03/20 0828) ? ? ?LABS: ?Results for orders placed or performed during the hospital encounter of 04/01/21 (from the past 48 hour(s))  ?Comprehensive metabolic panel     Status: Abnormal  ? Collection Time: 04/01/21  2:00 PM  ?Result Value Ref Range  ? Sodium 131 (L) 135 - 145 mmol/L  ? Potassium 5.3 (H) 3.5 - 5.1 mmol/L  ? Chloride 104 98 - 111 mmol/L  ? CO2 21 (L) 22 - 32 mmol/L  ? Glucose, Bld 93 70 - 99 mg/dL  ?  Comment: Glucose reference range applies only to samples taken after fasting for at least 8 hours.  ? BUN 34 (H) 8 - 23 mg/dL  ? Creatinine, Ser 1.15 (H) 0.44 - 1.00 mg/dL  ? Calcium 7.8 (L) 8.9 - 10.3 mg/dL  ? Total Protein 6.4 (L) 6.5 - 8.1 g/dL  ? Albumin 2.1 (L) 3.5 - 5.0 g/dL  ? AST 30 15 - 41 U/L  ? ALT 12 0 - 44 U/L  ? Alkaline Phosphatase 54 38 - 126 U/L  ? Total Bilirubin 0.7 0.3 - 1.2 mg/dL  ? GFR, Estimated 49 (L) >60 mL/min  ?  Comment: (NOTE) ?Calculated using the CKD-EPI Creatinine Equation (2021) ?  ? Anion gap 6 5 - 15  ?  Comment: Performed at [facility], [address] [place], [place] [address]  ?Lactic acid, plasma     Status: None  ? Collection Time: 04/01/21  2:00 PM  ?Result Value Ref Range  ? Lactic Acid, Venous 1.7 0.5 - 1.9 mmol/L  ?  Comment: Performed at [facility], [address], [place],  [address]  ?CBC with Differential      Status: Abnormal  ? Collection Time: 04/01/21  2:00 PM  ?Result Value Ref Range  ? WBC 7.1 4.0 - 10.5 K/uL  ? RBC 2.71 (L) 3.87 - 5.11 MIL/uL  ? Hemoglobin 8.2 (L) 12.0 - 15.0 g/dL  ? HCT 26.4 (L) 36.0 - 46.0 %  ? MCV 97.4 80.0 - 100.0 fL  ? MCH 30.3 26.0 - 34.0 pg  ? MCHC 31.1 30.0 - 36.0 g/dL  ? RDW 15.6 (H) 11.5 - 15.5 %  ? Platelets 171 150 - 400 K/uL  ? nRBC 0.0 0.0 - 0.2 %  ? Neutrophils Relative % 85 %  ? Neutro Abs 6.0 1.7 - 7.7 K/uL  ? Lymphocytes Relative 10 %  ? Lymphs Abs 0.7 0.7 - 4.0 K/uL  ? Monocytes Relative 5 %  ?  Monocytes Absolute 0.3 0.1 - 1.0 K/uL  ? Eosinophils Relative 0 %  ? Eosinophils Absolute 0.0 0.0 - 0.5 K/uL  ? Basophils Relative 0 %  ? Basophils Absolute 0.0 0.0 - 0.1 K/uL  ? Immature Granulocytes 0 %  ? Abs Immature Granulocytes 0.03 0.00 - 0.07 K/uL  ?  Comment: Performed at [facility], [address], [place], [place] [address]  ?Protime-INR     Status: Abnormal  ? Collection Time: 04/01/21  2:00 PM  ?Result Value Ref Range  ? Prothrombin Time 17.0 (H) 11.4 - 15.2 seconds  ? INR 1.4 (H) 0.8 - 1.2  ?  Comment: (NOTE) ?INR goal varies based on device and disease states. ?Performed at [facility], [place], ?[place] [address] ?  ?Culture, blood (Routine x 2)     Status: None (Preliminary result)  ? Collection Time: 04/01/21  2:32 PM  ? Specimen: BLOOD  ?Result Value Ref Range  ? Specimen Description BLOOD PORT   ? Special Requests BOTTLES DRAWN AEROBIC AND ANAEROBIC BCHV   ? Culture    ?  NO GROWTH < 24 HOURS ?Performed at [facility], [address] [place], [place] [address] ?  ? Report Status PENDING   ?Culture, blood (Routine x 2)     Status: None (Preliminary result)  ? Collection Time: 04/01/21  2:46 PM  ? Specimen: BLOOD  ?Result Value Ref Range  ? Specimen Description BLOOD RFOA   ? Special Requests BOTTLES DRAWN AEROBIC AND ANAEROBIC BCLV   ? Culture    ?  NO GROWTH < 24 HOURS ?Performed at [facility], [address] [place], [place] [address] ?  ? Report Status PENDING   ?Urinalysis, Routine w reflex microscopic Urine, Clean Catch     Status: Abnormal  ? Collection Time: 04/01/21  4:33 PM  ?Result Value Ref Range

## 2021-04-02 NOTE — Evaluation (Addendum)
Clinical/Bedside Swallow Evaluation ?Patient Details  ?Name: [name] ?MRN: [id number] ?Date of Birth: April 21, 1940 ? ?Today's Date: 04/02/2021 ?Time: SLP Start Time (ACUTE ONLY): 9528 SLP Stop Time (ACUTE ONLY): 4132 ?SLP Time Calculation (min) (ACUTE ONLY): 60 min ? ?Past Medical History:  ?Past Medical History:  ?Diagnosis Date  ? Arthritis   ? Breast cancer [facility]) 2011  ? radiation- Left  ? CHF (congestive heart failure) ([place])   ? Coronary artery disease   ? Dyspnea   ? Hyperlipidemia   ? Hypertension   ? Myocardial infarction [facility])   ? Personal history of radiation therapy   ? PVD (peripheral vascular disease) ([place])   ? ?Past Surgical History:  ?Past Surgical History:  ?Procedure Laterality Date  ? ABDOMINAL HYSTERECTOMY    ? APPENDECTOMY    ? BREAST BIOPSY Left 12/21/2009  ? positive, radiation dcis  ? BREAST EXCISIONAL BIOPSY Left 01/17/2010  ? lumpectomy DCIS  ? BREAST LUMPECTOMY    ? CATARACT EXTRACTION W/ INTRAOCULAR LENS IMPLANT & ANTERIOR VITRECTOMY, BILATERAL    ? CHOLECYSTECTOMY    ? COLONOSCOPY WITH PROPOFOL N/A 09/25/2017  ? Procedure: COLONOSCOPY WITH PROPOFOL;  Surgeon: Lollie Sails, MD;  Location: [facility] ENDOSCOPY;  Service: Endoscopy;  Laterality: N/A;  ? CORONARY ARTERY BYPASS GRAFT N/A 05/03/2020  ? Procedure: CORONARY ARTERY BYPASS GRAFTING (CABG) X FOUR ON PUMP USING LEFT INTERNAL MAMMARY ARTERY AND RIGHT ENDOSCOPIC SAPHEOUS VEIN HARVEST CONDUITS;  Surgeon: Melrose Nakayama, MD;  Location: [place];  Service: Open Heart Surgery;  Laterality: N/A;  ? DILATION AND CURETTAGE OF UTERUS    ? EYE SURGERY    ? bilateral cataracts  ? INTERCOSTAL NERVE BLOCK Left 11/13/2020  ? Procedure: INTERCOSTAL NERVE BLOCK;  Surgeon: Melrose Nakayama, MD;  Location: [place];  Service: Thoracic;  Laterality: Left;  ? IR IMAGING GUIDED PORT INSERTION  01/12/2021  ? JOINT REPLACEMENT Right 01/24/2016  ? KNEE ARTHROPLASTY Right 01/24/2016  ? Procedure: COMPUTER ASSISTED TOTAL KNEE ARTHROPLASTY;  Surgeon:  Dereck Leep, MD;  Location: [facility] ORS;  Service: Orthopedics;  Laterality: Right;  ? LEFT HEART CATH AND CORONARY ANGIOGRAPHY N/A 05/01/2020  ? Procedure: LEFT HEART CATH AND CORONARY ANGIOGRAPHY;  Surgeon: Wellington Hampshire, MD;  Location: [place] CV LAB;  Service: Cardiovascular;  Laterality: N/A;  ? LUNG LOBECTOMY  10/2020  ? LYMPH NODE DISSECTION Left 11/13/2020  ? Procedure: LYMPH NODE DISSECTION;  Surgeon: Melrose Nakayama, MD;  Location: [facility] OR;  Service: Thoracic;  Laterality: Left;  ? RIGHT/LEFT HEART CATH AND CORONARY/GRAFT ANGIOGRAPHY N/A 07/27/2020  ? Procedure: RIGHT/LEFT HEART CATH AND CORONARY/GRAFT ANGIOGRAPHY;  Surgeon: Wellington Hampshire, MD;  Location: [place] CV LAB;  Service: Cardiovascular;  Laterality: N/A;  ? stent in Lt leg Left   ? TEE WITHOUT CARDIOVERSION N/A 05/03/2020  ? Procedure: TRANSESOPHAGEAL ECHOCARDIOGRAM (TEE);  Surgeon: Melrose Nakayama, MD;  Location: [place];  Service: Open Heart Surgery;  Laterality: N/A;  ? VIDEO BRONCHOSCOPY WITH ENDOBRONCHIAL NAVIGATION N/A 09/25/2020  ? Procedure: VIDEO BRONCHOSCOPY WITH ENDOBRONCHIAL NAVIGATION;  Surgeon: Melrose Nakayama, MD;  Location: [place];  Service: Thoracic;  Laterality: N/A;  ? ?HPI:  ?Pt is a 81 y/o female with current stage IIIb squamous cell lung cancer s/p resection of LLL but unable to tolerate 4 rounds of chemotherapy as recommended, thick walled cavitary and posterior left hemithorax thought to be a lung abscess although not yet biopsied, HFrEF and extremely poor premorbid functioning with decreased p.o. intake and protein  calorie malnutrition, MI, CABG x 4, CAD, PAD, HLD, NSTEMI, HTN, DJD, CHF and breast cancer s/p XRT who was discharged 5 days ago after treatment for CAP is now brought back in by family for shortness of breath.  She had d/c'd home despite recommendations to d/c to SNF for rehab.  Pt now stating they are unable to care for her at home.  Pt is ffollowed by the Dietitian at the  [facility]. Pt with poor appetite and oral intake at baseline. Pt was eating ~50% of meals during her last admission. Per chart, pt is down 17lbs(15%) over the past two months; this is severe weight loss. Pt will not drink nutritional supplements.    ? ?CT angio of Chest: New tree-in-bud opacities in the right lung typical of  bronchiolitis, likely infectious.  3. Previous left lung consolidation anteriorly is improving,  although slight worsening in the dependent left lung.  4. Thick-walled air-fluid collection in the posterior left  hemithorax is not significantly changed from CT 11 days ago. This  may represent sequela of prior lobectomy or pulmonary abscess.  5. Similar prominent mediastinal and hilar nodes.  ? ?  ?Assessment / Plan / Recommendation  ?Clinical Impression ? Pt seen for BSE today. Pt was resting in bed; required full positioning support d/t weakness. She was not overly inclined to take po trials w/ this Clinician after taking bites/sips w/ Dtr present -- had brought in [facility] for pleasure. Pt exhibited a mild congested cough at Rest, moving about in bed. Verbal to answer few basic questions; strong voice and cough. Needed MIN+ cues for follow through w/ tasks.      ? ?Pt appears to present w/ adequate oropharyngeal phase swallow w/ No immediate, overt oropharyngeal phase dysphagia noted, No neuromuscular deficits noted. Pt consumed po trials w/ no immediate, overt clinical s/s of aspiration during po trials. However, pt does have increased risk factors for aspiration currently including Missing Lower Dentition/denture plate for effective mastication, and acute illness w/ Pulmonary decline. She has also had recent decreased oral intake and lengthy illness for ~2+ weeks.    ?Pt appears at reduced risk for aspiration when following general aspiration precautions and modifying diet slightly to ease mastication and conservation of energy; and when given support at meals for Upright  positioning and helping w/ feeding of foods.       ? ?During po trials, pt consumed all consistencies w/ no immediate, overt coughing, decline in vocal quality, or change in respiratory presentation during/post trials. O2 sats remained ~98-99%. Oral phase appeared [facility] w/ timely bolus management and control of bolus propulsion for A-P transfer for swallowing w/ liquids and purees; min increased mastication time/effort noted w/ increased texture d/t missing lower Dentition. Oral clearing achieved w/ all trial consistencies given Time and moistening/breaking down solids. OM Exam appeared grossly [facility] w/ no unilateral weakness noted; mild lingual irritation of surface -- pt stated d/t a Pill that dissolved on her tongue last week. MD made aware. Speech Clear. Pt fed self sips of liquids holding Cup and given setup/support.      ? ?Recommend a more Mech Soft/Regular(for pleasure and encouragement to eat) consistency diet w/ well-chopped meats, moistened foods; Thin liquids - pt to help hold cup, small sips. Recommend general aspiration precautions. Support at meals w/ sitting up and support w/ feeding of po's as needed. Pills WHOLE in Puree for safer, easier swallowing. Education given on Pills in Puree; food consistencies and easy to eat options;  general aspiration precautions; and need to support pt during acute illness.  ?Recommend continue following w/ Dietician for support. Recommend f/u w/ Palliative Care for [place], support. Recommend f/u w/ Respiratory for incentive spirometer/breathing exercises. Recommend f/u w/ mouth rinse as pt was recommended at home last week d/t mild lingual irritation. PT/OT assessments and f/u. NSG updated. Family agreed. ?SLP Visit Diagnosis: Dysphagia, unspecified (R13.10) ?   ?Aspiration Risk ? Mild aspiration risk;Risk for inadequate nutrition/hydration (generalized weakness; reduced following precautions)  ?  ?Diet Recommendation   Mech Soft/Regular(for pleasure and encouragement to  eat) consistency diet w/ well-chopped meats, moistened foods; Thin liquids - pt to help hold cup, small sips. Recommend general aspiration precautions. Support at meals w/ sitting up and support w/ feeding of po's as ne

## 2021-04-02 NOTE — Progress Notes (Addendum)
Initial Nutrition Assessment ? ?DOCUMENTATION CODES:  ? ?Severe malnutrition in context of chronic illness ? ?INTERVENTION:  ? ?Magic cup TID with meals, each supplement provides 290 kcal and 9 grams of protein ? ?Vital Cuisine TID, each supplement provides 520kcal and 22g of protein.  ? ?MVI po daily  ? ?Pt at high refeed risk; recommend monitor potassium, magnesium and phosphorus labs daily until stable ? ?NUTRITION DIAGNOSIS:  ? ?Severe Malnutrition related to cancer and cancer related treatments as evidenced by 15 percent weight loss in two months, severe fat depletion, severe muscle depletion. ? ?GOAL:  ? ?Patient will meet greater than or equal to 90% of their needs ? ?MONITOR:  ? ?PO intake, Supplement acceptance, Labs, Weight trends, Skin, I & O's ? ?REASON FOR ASSESSMENT:  ? ?Malnutrition Screening Tool ?  ? ?ASSESSMENT:  ? ?81 y/o female with h/o MI, CABG x 4, SCC lung s/p XRT/chemo, CAD, PAD, HLD, NSTEMI, HTN, DJD, CHF and breast cancer s/p XRT who is admitted with recurrent PNA. ? ?Met with pt in room today. Pt is well known to nutrition department from a recent previous admission. Pt is also followed by the Dietitian at the [facility]. Pt with poor appetite and oral intake at baseline. Pt was eating ~50% of meals during her last admission. Per chart, pt is down 17lbs(15%) over the past two months; this is severe weight loss. Pt will not drink nutritional supplements. Pt is alert and asking for food today. SLP evaluation pending; pt currently ordered for a pureed diet. RD will add supplements to meal trays. Pt is likely at high refeed risk. Palliative care consult is pending.  ? ?Medications reviewed and include: aspirin, plavix, doxycycline, levophed, zosyn  ? ?Labs reviewed: Na 132(L), K 4.1 wnl ?Hgb 6.8(L), Hct 21.7(L) ? ?NUTRITION - FOCUSED PHYSICAL EXAM: ? ?Flowsheet Row Most Recent Value  ?Orbital Region Severe depletion  ?Upper Arm Region Severe depletion  ?Thoracic and Lumbar Region Moderate  depletion  ?Buccal Region Severe depletion  ?Temple Region Severe depletion  ?Clavicle Bone Region Severe depletion  ?Clavicle and Acromion Bone Region Severe depletion  ?Scapular Bone Region Severe depletion  ?Dorsal Hand Severe depletion  ?Patellar Region Severe depletion  ?Anterior Thigh Region Severe depletion  ?Posterior Calf Region Severe depletion  ?Edema (RD Assessment) Mild  ?Hair Reviewed  ?Eyes Reviewed  ?Mouth Reviewed  ?Skin Reviewed  ?Nails Reviewed  ? ?Diet Order:   ?Diet Order   ? ?       ?  DIET - DYS 1 Room service appropriate? Yes; Fluid consistency: Thin  Diet effective now       ?  ? ?  ?  ? ?  ? ?EDUCATION NEEDS:  ? ?Education needs have been addressed ? ?Skin:  Skin Assessment: Reviewed RN Assessment (Stage II sacrum) ? ?Last BM:  3/15- type 7 ? ?Height:  ? ?Ht Readings from Last 1 Encounters:  ?04/01/21 5' (1.524 m)  ? ? ?Weight:  ? ?Wt Readings from Last 1 Encounters:  ?04/02/21 42.6 kg  ? ? ?Ideal Body Weight:  45.4 kg ? ?BMI:  Body mass index is 18.34 kg/m?. ? ?Estimated Nutritional Needs:  ? ?Kcal:  1400-1600kcal/day ? ?Protein:  70-80g/day ? ?Fluid:  1.1-1.3L/day ? ?Koleen Distance MS, RD, LDN ?Please refer to [facility] for RD and/or RD on-call/weekend/after hours pager ? ?

## 2021-04-02 NOTE — Progress Notes (Signed)
?   04/02/21 1100  ?Clinical Encounter Type  ?Visited With Patient and family together  ?Visit Type Initial  ?Consult/Referral To Other (Comment) ?([place] consult to facilitate Advance Directive)  ?Advance Directives (For Healthcare)  ?Does Patient Have a Medical Advance Directive? Yes  ? ?Chaplain responded to facilitate Advance Directive. Chaplain informed from visiting family member that they have one.  ?

## 2021-04-03 ENCOUNTER — Ambulatory Visit: Payer: Medicare Other | Admitting: Thoracic Surgery (Cardiothoracic Vascular Surgery)

## 2021-04-03 ENCOUNTER — Inpatient Hospital Stay: Payer: Medicare Other

## 2021-04-03 DIAGNOSIS — J9621 Acute and chronic respiratory failure with hypoxia: Secondary | ICD-10-CM | POA: Diagnosis not present

## 2021-04-03 DIAGNOSIS — Z7189 Other specified counseling: Secondary | ICD-10-CM

## 2021-04-03 DIAGNOSIS — J189 Pneumonia, unspecified organism: Secondary | ICD-10-CM

## 2021-04-03 LAB — BASIC METABOLIC PANEL
Anion gap: 6 (ref 5–15)
BUN: 14 mg/dL (ref 8–23)
CO2: 24 mmol/L (ref 22–32)
Calcium: 7.7 mg/dL — ABNORMAL LOW (ref 8.9–10.3)
Chloride: 103 mmol/L (ref 98–111)
Creatinine, Ser: 0.84 mg/dL (ref 0.44–1.00)
GFR, Estimated: >60 mL/min (ref >60)
Glucose, Bld: 123 mg/dL — ABNORMAL HIGH (ref 70–99)
Potassium: 3.1 mmol/L — ABNORMAL LOW (ref 3.5–5.1)
Sodium: 133 mmol/L — ABNORMAL LOW (ref 135–145)

## 2021-04-03 LAB — RESPIRATORY PANEL BY PCR

## 2021-04-03 LAB — CBC WITH DIFFERENTIAL/PLATELET
Abs Immature Granulocytes: 0.03 10*3/uL (ref 0.00–0.07)
Basophils Absolute: 0 10*3/uL (ref 0.0–0.1)
Basophils Relative: 0 %
Eosinophils Absolute: 0.1 10*3/uL (ref 0.0–0.5)
Eosinophils Relative: 2 %
HCT: 25.8 % — ABNORMAL LOW (ref 36.0–46.0)
Hemoglobin: 8.4 g/dL — ABNORMAL LOW (ref 12.0–15.0)
Immature Granulocytes: 0 %
Lymphocytes Relative: 14 %
Lymphs Abs: 1 10*3/uL (ref 0.7–4.0)
MCH: 30.7 pg (ref 26.0–34.0)
MCHC: 32.6 g/dL (ref 30.0–36.0)
MCV: 94.2 fL (ref 80.0–100.0)
Monocytes Absolute: 0.3 10*3/uL (ref 0.1–1.0)
Monocytes Relative: 5 %
Neutro Abs: 5.8 10*3/uL (ref 1.7–7.7)
Neutrophils Relative %: 79 %
Platelets: 150 10*3/uL (ref 150–400)
RBC: 2.74 MIL/uL — ABNORMAL LOW (ref 3.87–5.11)
RDW: 16.2 % — ABNORMAL HIGH (ref 11.5–15.5)
WBC: 7.3 10*3/uL (ref 4.0–10.5)
nRBC: 0 % (ref 0.0–0.2)

## 2021-04-03 LAB — TYPE AND SCREEN
ABO/RH(D): O POS
Antibody Screen: NEGATIVE
Unit division: 0

## 2021-04-03 LAB — PHOSPHORUS: Phosphorus: 2.9 mg/dL (ref 2.5–4.6)

## 2021-04-03 LAB — BPAM RBC
Blood Product Expiration Date: 202304192359
ISSUE DATE / TIME: 202303201001
Unit Type and Rh: 5100

## 2021-04-03 LAB — PROCALCITONIN: Procalcitonin: 0.23 ng/mL

## 2021-04-03 LAB — MAGNESIUM: Magnesium: 1.4 mg/dL — ABNORMAL LOW (ref 1.7–2.4)

## 2021-04-03 LAB — POTASSIUM: Potassium: 4.2 mmol/L (ref 3.5–5.1)

## 2021-04-03 MED ORDER — POTASSIUM CHLORIDE CRYS ER 20 MEQ PO TBCR
40.0000 meq | EXTENDED_RELEASE_TABLET | Freq: Once | ORAL | Status: AC
  Administered 2021-04-03: 40 meq via ORAL
  Filled 2021-04-03: qty 2

## 2021-04-03 MED ORDER — GERHARDT'S BUTT CREAM
TOPICAL_CREAM | Freq: Three times a day (TID) | CUTANEOUS | Status: DC
  Filled 2021-04-03 (×2): qty 1

## 2021-04-03 MED ORDER — BENZONATATE 100 MG PO CAPS
200.0000 mg | ORAL_CAPSULE | Freq: Two times a day (BID) | ORAL | Status: DC | PRN
  Administered 2021-04-03 – 2021-04-05 (×4): 200 mg via ORAL
  Filled 2021-04-03 (×4): qty 2

## 2021-04-03 MED ORDER — MIDODRINE HCL 5 MG PO TABS
5.0000 mg | ORAL_TABLET | Freq: Once | ORAL | Status: AC
  Administered 2021-04-03: 5 mg via ORAL
  Filled 2021-04-03: qty 1

## 2021-04-03 MED ORDER — MAGNESIUM SULFATE 2 GM/50ML IV SOLN
2.0000 g | Freq: Once | INTRAVENOUS | Status: AC
Start: 2021-04-03 — End: 2021-04-03
  Administered 2021-04-03: 2 g via INTRAVENOUS
  Filled 2021-04-03: qty 50

## 2021-04-03 MED ORDER — DOXYCYCLINE HYCLATE 100 MG PO TABS
100.0000 mg | ORAL_TABLET | Freq: Two times a day (BID) | ORAL | Status: DC
  Administered 2021-04-03 – 2021-04-04 (×2): 100 mg via ORAL
  Filled 2021-04-03 (×2): qty 1

## 2021-04-03 MED ORDER — MAGIC MOUTHWASH W/LIDOCAINE
5.0000 mL | Freq: Three times a day (TID) | ORAL | Status: DC | PRN
  Administered 2021-04-04: 5 mL via ORAL
  Filled 2021-04-03 (×2): qty 5

## 2021-04-03 MED ORDER — MIDODRINE HCL 5 MG PO TABS
10.0000 mg | ORAL_TABLET | Freq: Three times a day (TID) | ORAL | Status: DC
  Administered 2021-04-04 – 2021-04-06 (×8): 10 mg via ORAL
  Filled 2021-04-03 (×8): qty 2

## 2021-04-03 MED ORDER — POTASSIUM CHLORIDE 20 MEQ PO PACK
40.0000 meq | PACK | Freq: Once | ORAL | Status: DC
  Filled 2021-04-03: qty 2

## 2021-04-03 MED ORDER — METRONIDAZOLE 500 MG/100ML IV SOLN: 500.0000 mg | Freq: Three times a day (TID) | INTRAVENOUS | Status: DC

## 2021-04-03 MED ORDER — POTASSIUM CHLORIDE 20 MEQ PO PACK
40.0000 meq | PACK | Freq: Once | ORAL | Status: DC
Start: 2021-04-03 — End: 2021-04-03

## 2021-04-03 NOTE — Progress Notes (Signed)
?  [facility]   ? ?Date of Admission:  04/01/2021   Total days of antibiotics 3/day 2 piptazo/metro ?        ? ?ID: NAVADA OSTERHOUT is a 81 y.o. female with lung abscess ?Principal Problem: ?  Acute on chronic respiratory failure ([place]) ?Active Problems: ?  S/P CABG x 4 ?  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  Community acquired pneumonia complicated by underlying lung cancer, apparent lung abscess on CT Chest  ?  Sinus tachycardia ?  Pressure injury of skin ? ? ? ?Subjective: ?Weaned off of vasopressors this morning but still has SBp 88-90s this afternoon. "I just want to go home ,and be better". Has productive cough but improved ? ?Medications:  ? aspirin EC  81 mg Oral Daily  ? atorvastatin  20 mg Oral QHS  ? Chlorhexidine Gluconate Cloth  6 each Topical Q0600  ? clopidogrel  75 mg Oral Daily  ? doxycycline  100 mg Oral Q12H  ? gabapentin  100 mg Oral BID  ? midodrine  5 mg Oral TID WC  ? potassium chloride  40 mEq Oral Once  ? potassium chloride  40 mEq Oral Once  ? ? ?Objective: ?Vital signs in last 24 hours: ?Temp:  [97.8 ?F (36.6 ?C)-98.3 ?F (36.8 ?C)] 98 ?F (36.7 ?C) (03/21 1200) ?Pulse Rate:  [69-113] 113 (03/21 1200) ?Resp:  [11-29] 22 (03/21 1200) ?BP: (89-128)/(38-79) 102/60 (03/21 1200) ?SpO2:  [97 %-100 %] 100 % (03/21 1200) ? ?Physical Exam  ?Constitutional:  oriented to person, place, and time. appears frail and under-nourished. No distress.  ?HENT: [place]/AT, PERRLA, no scleral icterus ?Mouth/Throat: Oropharynx is clear and moist. No oropharyngeal exudate.  ?Cardiovascular: Normal rate, regular rhythm and normal heart sounds. Exam reveals no gallop and no friction rub.  ?No murmur heard.  ?Pulmonary/Chest: Effort normal and breath sounds normal. No respiratory distress. Mild rhonchi anterior lung fields. ?Neck = supple, no nuchal rigidity ?Abdominal: Soft. Bowel sounds are normal.  exhibits no distension. There is no tenderness.  ?Lymphadenopathy:  no cervical adenopathy. No axillary adenopathy ?Neurological: alert and oriented to person, place, and time.  ?Skin: Skin is warm and dry. No rash noted. No erythema.  ?Psychiatric: a normal mood and affect.  behavior is normal.  ? ? ?Lab Results ?Recent Labs  ?  04/02/21 ?6295 04/02/21 ?1943 04/03/21 ?0353  ?WBC 7.7  --  7.3  ?HGB 6.8* 8.8* 8.4*  ?HCT 21.7* 27.3* 25.8*  ?NA 132*  --  133*  ?K 4.1  --  3.1*  ?CL 103  --  103  ?CO2 24  --  24  ?BUN 23  --  14  ?CREATININE 0.91  --  0.84  ? ?Liver Panel ?Recent Labs  ?  04/01/21 ?1400 04/02/21 ?0541  ?PROT 6.4* 5.6*  ?ALBUMIN 2.1* 1.7*  ?AST 30 48*  ?ALT 12 14  ?ALKPHOS 54 46  ?BILITOT 0.7 0.5  ? ?Sedimentation Rate ?No results for input(s): ESRSEDRATE in the last 72 hours. ?C-Reactive Protein ?No results for input(s): CRP in the last 72 hours. ? ?Microbiology: ?RVP = + adenovirus ?Sputum cx = PENDING ?Studies/Results: ?DG Chest Port 1 View ? ?Result Date: 04/03/2021 ?CLINICAL DATA:  Acute respiratory failure EXAM: PORTABLE CHEST 1 VIEW COMPARISON:  Chest x-ray dated April 01, 2021 FINDINGS: Visualized cardiac and mediastinal contours are unchanged post median sternotomy CABG. Unchanged position of right chest wall port. Unchanged left greater than right  heterogeneous opacities and pleural thickening of the left lung. Apparent increased density of the left lung base is likely due to differences in patient positioning. No large pleural effusion or pneumothorax. IMPRESSION: Left greater than right heterogeneous opacities, similar prior exam. Electronically Signed   By: Yetta Glassman M.D.   On: 04/03/2021 08:12   ? ? ?Assessment/Plan: ?Lung abscess = currently on piptazo plus doxycycline. Would wait to see what sputum cx identifies to help with narrowing abtx. When she was admitted earlier in the month, she was discharged on cefuroxime plus metronidazole. + adenovirus on RVP, not sure if this is necessarily a new viral infection (since RVP was not checked at last  hospitalization), it could explain exacerbation - she is not known to have sick contact. ? ?Hypotension = has been weaned off of vasopressors, but still may need titration of midodrine ? ?Hx of lung ca, deconditioning = appreciate palliative care input. Patient still would like to receive iv abtx to improve. ? ?Carlyle Basques ?[facility] ?Pager: [phone] ? ?04/03/2021, 3:35 PM ? ? ? ? ? ?

## 2021-04-03 NOTE — Evaluation (Signed)
Occupational Therapy Evaluation ?Patient Details ?Name: [name] ?MRN: [id number] ?DOB: 08/06/1940 ?Today's Date: 04/03/2021 ? ? ?History of Present Illness 81 yo F presenting to [facility] ED on 04/01/21 with complaints of shortness of breath, weakness and a productive cough. On arrival, the patient was hypotensive and required an increase in supplemental oxygen from chronic 2 L [place] to 4 L [place]. Pt was initially admitted to the [facility] unit with acute on chronic hypoxic respiratory failure secondary to new bronchiectasis/possible aspiration, however, she became hypotensive, hgb 6.8 requiring 1 unit of pRBC's and levophed gtt. 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 ?Clinical Impression ?  ?Pt seen for OT evaluation this date. Upon arrival to room, pt seated upright in bed with son [name]) present. Pt A&Ox3 however endorses decreased short term memory compared to baseline. Son at bedside reporting that pt's report regarding PLOF is inaccurate. At baseline, pt is independent with all ADLs/functional mobility and lives in a 2-level home alone. Son reports that after previous hospital admission, pt discharged home and required physical assistance from family for all ADLs and only occasionally left her recliner to perform functional mobility of short household distances. Pt currently presents with decreased strength, balance, and activity tolerance. Due to these functional impairments, pt requires MAX A for LB dressing, MIN A for bed mobility, MIN A for toilet transfers to/from [facility], and MIN A for peri-care. Pt would benefit from additional skilled OT services to maximize return to PLOF and minimize risk of future falls, injury, caregiver burden, and readmission. Upon discharge, recommend STR as pt is far from her baseline and family is unable to provide frequent supervision/assistance at home.  ? ?While on 3L of O2, SpO2 >90% throughout. HR 110s-120s  during activity (MAX 125bpm during stand pivot transfer). BP supine: 119/52, BP sitting EOB 106/48, BP sitting in recliner following stand pivot transfer: 110/54.  ? ?Recommendations for follow up therapy are one component of a multi-disciplinary discharge planning process, led by the attending physician.  Recommendations may be updated based on patient status, additional functional criteria and insurance authorization.  ? ?Follow Up Recommendations ? Skilled nursing-short term rehab (<3 hours/day)  ?  ?Assistance Recommended at Discharge Frequent or constant Supervision/Assistance  ?Patient can return home with the following A little help with walking and/or transfers;A lot of help with bathing/dressing/bathroom;Assistance with cooking/housework;Assistance with feeding;Direct supervision/assist for medications management;Help with stairs or ramp for entrance ? ?  ?Functional Status Assessment ? Patient has had a recent decline in their functional status and demonstrates the ability to make significant improvements in function in a reasonable and predictable amount of time.  ?Equipment Recommendations ? Other (comment) (defer to next venue of care)  ?  ?   ?Precautions / Restrictions Precautions ?Precautions: Fall ?Precaution Comments: monitor BP ?Restrictions ?Weight Bearing Restrictions: No  ? ?  ? ?Mobility Bed Mobility ?Overal bed mobility: Needs Assistance ?Bed Mobility: Supine to Sit ?  ?  ?Supine to sit: Min assist ?  ?  ?General bed mobility comments: Requires MIN A for trunk elevation ?  ? ?Transfers ?Overall transfer level: Needs assistance ?Equipment used: 1 person hand held assist ?Transfers: Sit to/from Stand ?Sit to Stand: Min assist ?  ?  ?  ?  ?  ?General transfer comment: Requires MIN A for upward momentum and steadying upon standing ?  ? ?  ?Balance Overall balance assessment: Needs assistance ?Sitting-balance support: No upper extremity supported,  Feet unsupported ?Sitting balance-Leahy Scale:  Fair ?Sitting balance - Comments: Requires SUPERVISION for static sitting balance at EOB ?  ?Standing balance support: Single extremity supported, During functional activity ?Standing balance-Leahy Scale: Poor ?Standing balance comment: Requires MIN A via 1-person HHA for standing balance ?  ?  ?  ?  ?  ?  ?  ?  ?  ?  ?  ?   ? ?ADL either performed or assessed with clinical judgement  ? ?ADL Overall ADL's : Needs assistance/impaired ?  ?  ?  ?  ?  ?  ?  ?  ?  ?  ?Lower Body Dressing: Maximal assistance;Bed level ?Lower Body Dressing Details (indicate cue type and reason): to don socks ?Toilet Transfer: Minimal assistance;Stand-pivot;BSC/3in1 ?Toilet Transfer Details (indicate cue type and reason): Requires MIN A for steadying via 1-person HHA ?Toileting- Clothing Manipulation and Hygiene: Minimal assistance;Sit to/from stand ?Toileting - Clothing Manipulation Details (indicate cue type and reason): Pt requires MIN A to ensure thoroughness ?  ?  ?  ?   ? ? ? ?Vision Ability to See in Adequate Light: 0 Adequate ?   ?   ?   ?   ? ?Pertinent Vitals/Pain Pain Assessment ?Pain Assessment: No/denies pain  ? ? ? ?   ?Extremity/Trunk Assessment Upper Extremity Assessment ?Upper Extremity Assessment: Generalized weakness ?  ?Lower Extremity Assessment ?Lower Extremity Assessment: Generalized weakness ?  ?  ?  ?Communication Communication ?Communication: No difficulties ?  ?Cognition Arousal/Alertness: Awake/alert ?Behavior During Therapy: Flat affect ?Overall Cognitive Status: Impaired/Different from baseline ?  ?  ?  ?  ?  ?  ?  ?  ?  ?  ?  ?  ?  ?  ?  ?  ?General Comments: patient is able to follow all commands without difficulty, however endorses decreased short term memory compared to baseline. Son at bedside reporting that pt's report regarding PLOF is inaccurate ?  ?  ?General Comments  While on 3L of O2, SpO2 >90% throughout. HR 110s-120s during activity (MAX 125bpm during stand pivot transfer). BP supine: 119/52, BP  sitting EOB 106/48, BP sitting in recliner following stand pivot transfer: 110/54. ? ?  ?   ?   ? ? ?Home Living Family/patient expects to be discharged to:: Private residence ?Living Arrangements: Alone ?Available Help at Discharge: Family;Available PRN/intermittently (daughter and son have been helping PRN, but they are unavailable 24/7 as they work) ?Type of Home: House ?Home Access: Stairs to enter ?Entrance Stairs-Number of Steps: 3 ?  ?Home Layout: Able to live on main level with bedroom/bathroom;Two level ?  ?  ?Bathroom Shower/Tub: Gaffer;Door ?  ?Bathroom Toilet: Handicapped height ?  ?  ?Home Equipment: Rolling Lupi (2 wheels);BSC/3in1;Cane - single point;Shower seat - built in;Grab bars - tub/shower ?  ?Additional Comments: she has DME but has not needed to use it in the past ?  ? ?  ?Prior Functioning/Environment Prior Level of Function : Independent/Modified Independent;Driving ?  ?  ?  ?  ?  ?  ?Mobility Comments: independent at baseline, however since previous hospital admission, son report's pt spends most of her day sleeping in the recliner ?ADLs Comments: independent at baseline. however since previous hospital admission, son report's pt requires physical assistance for all ADLs from daughter ?  ? ?  ?  ?OT Problem List: Decreased strength;Decreased activity tolerance;Impaired balance (sitting and/or standing);Decreased cognition;Cardiopulmonary status limiting activity ?  ?   ?OT Treatment/Interventions: Self-care/ADL training;Therapeutic exercise;Energy conservation;DME and/or AE  instruction;Therapeutic activities;Patient/family education;Balance training  ?  ?OT Goals(Current goals can be found in the care plan section) Acute Rehab OT Goals ?Patient Stated Goal: to get stronger before going home ?OT Goal Formulation: With patient/family ?Time For Goal Achievement: 04/17/21 ?Potential to Achieve Goals: Good ?ADL Goals ?Pt Will Perform Grooming: with min guard assist;standing ?Pt Will  Transfer to Toilet: with min guard assist;ambulating;bedside commode ?Pt Will Perform Toileting - Clothing Manipulation and hygiene: with min guard assist;sit to/from stand  ?OT Frequency: Min 2X/week ?  ? ?

## 2021-04-03 NOTE — Progress Notes (Signed)
? ?NAME:  [name], MRN:  [id number], DOB:  1940-10-10, LOS: 2 ?ADMISSION DATE:  04/01/2021, CONSULTATION DATE:  04/02/21 ?REFERRING MD:  Morton Amy, NP, CHIEF COMPLAINT: Shortness of Breath  ? ?Brief Patient Description / Synopsis:  ?81 y.o. female with PMHx of Stage 3 Lung cancer and recent hospitalization for Multilobar Pneumonia with suspected lung abscess, admitted with Acute on Chronic Hypoxic Respiratory Failure in setting of acute exacerbation of Bronchiectasis vs. Possible aspiration.  Course complicated by hypotension attributed to anemia and developing sepsis.  ? ?History of Present Illness:  ?81 yo F presenting to [facility] ED on 04/01/21 with complaints of shortness of breath, weakness and a productive cough. Per ED documentation, EMS reported on arrival the patient was hypotensive and requiring an increase in supplemental oxygen from chronic 2 L [place] to 4 L [place]. ? ?Of note patient was recently hospitalized from 03/21/21-03/28/21 with Acute on chronic HFrEF & acute hypoxic respiratory failure & sepsis s/t multi-lobar pneumonia and possible lung abscess. ID consulted treating the patient with antibiotics to hopefully avoid bronchoscopy. She was discharge on cefuroxime & metronidazole. ? ?ED course: ?Concern for sepsis with CTa negative for PE but showing new bud in tree morphology suggestive of bronchiectasis vs aspiration/infection. She was initially hypotensive and tachycardic, but stabilized with SpO2 holding on nasal cannula support. TRH consulted for admission. ?Chemistry: Na+:132, K+: 4.1, BUN/Cr.: 23/0.91, Serum CO2/ AG: 24/5 ?Hematology: WBC: 7.7, Hgb: 6.8,  ?BNP: 291.5, Lactic: 1.7, COVID-19 & Influenza A/B: negative ? ?CXR 04/01/21: Improved LEFT LOWER lung consolidation Lonia Chimera /atelectasis. Unchanged trace LEFT pleural effusion. ?CT angio 04/01/21: No pulmonary embolus. New tree-in-bud opacities in the right lung typical of bronchiolitis, likely infectious. Previous left lung consolidation anteriorly is  improving, although slight worsening in the dependent left lung. Thick-walled air-fluid collection in the posterior left ?hemithorax is not significantly changed from CT 11 days ago. This may represent sequela of prior lobectomy or pulmonary abscess. Similar prominent mediastinal and hilar nodes, recommend attention at follow-up. ? ?Overnight patient became hypotensive, received 250 mL bolus of fluid with improvement then subsequently her SBP dropped into the 60's. Hgb had also dropped this AM to 6.8 and blood transfusion was ordered by primary service. ?PCCM consulted for additional management due to circulatory shock requiring vasopressor administration. ? ?Pertinent  Medical History  ?Stage IIIb squamous cell lung cancer s/p resection of LLL (unable to tolerate 4 rounds of chemo as recommended) ?HFrEF ?Breast Cancer ?CAD ?HLD ?HTN ?MI ?PVD ? ?MICRO Data:  ?04/01/21: SARS-CoV-2 and influenza PCR>> negative ?04/01/2021: Blood culture x2>> NGTD ?04/02/2021: MRSA PCR>> negative ?04/02/2021: Sputum>> ?04/03/2021: Respiratory viral panel>> ? ?Antimicrobials:  ?Cefepime 3/19>> 3/20 ?Vancomycin 3/19>> 3/20 ?Doxycycline 3/20>> ?Zosyn 3/20>> ? ?Significant Hospital Events: ?Including procedures, antibiotic start and stop dates in addition to other pertinent events   ?03/20: Pt initially admitted to the [facility] unit with acute on chronic hypoxic respiratory failure secondary to new bronchiectasis/possible aspiration.  However, she became hypotensive hgb 6.8 requiring 1 unit of pRBC's and levophed gtt. ID consulted ?03/21: Hgb stable s/p 1 unit pRBC yesterday.  Levophed weaned off this morning. ? ?Interim History / Subjective:  ?-No significant events noted overnight ?-Afebrile, hemodynamically stable, Levophed weaned off this morning ?-Requiring 3L Nasal cannula (baseline 2 L) ?-Hgb improved and stable at 8.4 s/p 1 unit pRBC's yesterday ?-ID consulted yesterday ~ repeat Sputum obtained, ABX changed to Doxycycline and  Zosyn ?-Today will check PCT and RVP per ID recommendations ?-Pt reports generalized malaise and fatigue, cough,  SOB, poor appetite ?-Denies dizziness, chest pain, palpations, N/V/D, abdominal pain, fever/chills ? ?Objective   ?Blood pressure (!) 117/53, pulse 90, temperature 98 ?F (36.7 ?C), temperature source Oral, resp. rate (!) 24, height 5' (1.524 m), weight 42.6 kg, SpO2 100 %. ?   ?   ? ?Intake/Output Summary (Last 24 hours) at 04/03/2021 0808 ?Last data filed at 04/03/2021 [phone] ?Gross per 24 hour  ?Intake 899.67 ml  ?Output 460 ml  ?Net 439.67 ml  ? ? ?Filed Weights  ? 04/01/21 1349 04/02/21 0828  ?Weight: 43.1 kg 42.6 kg  ? ? ?Examination: ?General: Acute on chronically ill appearing frail Adult female, sitting in bed, NAD ?HEENT: MM pink/dry, anicteric, atraumatic, neck supple ?Neuro: A&O x 3, able to follow commands, no focal deficits, PERRL +3, MAE ?CV: s1s2, Tachycardia, regular rhythm, ST on monitor, no r/m/g ?Pulm: Regular, non labored on 3 L [place], breath sounds diminished throughout ?GI: soft, rounded, non tender, bs x 4 ?Skin: limited exam- no rashes/lesions noted ?Extremities: warm/dry, pulses + 2 R/P, no edema noted ? ?Resolved Hospital Problem list   ? ? ?Assessment & Plan:  ?Acute on chronic hypoxic respiratory failure secondary to new bronchiectasis/possible aspiration and stage 3 lung cancer  ?  Recently hospitalized from 03/21/21-03/28/21 with acute on chronic HFrEF & acute hypoxic respiratory failure & sepsis s/t multi-lobar pneumonia and possible lung abscess.  ID consulted at that time pt initially treated with vancomycin, ceftriaxone, and metronidazole but than narrowed to vancomycin and unasyn.  Discharge abx therapy cefuroxime and metronidazole  ?-Supplemental O2 as needed to maintain O2 sats >92% ?-Follow intermittent Chest X-ray & ABG as needed ?-Repeat CT Chest still shows possible lung abscess ?-Prn Bronchodilators  ?-ABX as above ?-Pulmonary toilet as able ?-Aspiration precautions   ?-Monitor fever curve ?-Trend WBC's & Procalcitonin ?-Follow cultures as above ?-ID is following, appreciate input ?-Continue empiric Doxycycline & Zosyn pending cultures & sensitivities as per ID ? ?Hypotension secondary anemia and possible sepsis  ?Chronic HFrEF without acute exacerbation ?Hx: HTN, HLD ?-Continuous cardiac monitoring ?-Maintain MAP >65 ?-Vasopressors as needed to maintain MAP goal ?-Continue Midodrine ?-Transfusions as indicated (s/p 1 unit pRBC's 3/20) ?-Lactic acid has normalized (1.7) ?-Echocardiogram 12/21/20: LVEF 20-25%, indeterminate diastolic parameters, RV systolic function normal ?-Hold outpatient antihypertensives  ?-Hold outpatient diuretic therapy for now due to hypotension  ?-Continue outpatient atorvastatin  ? ?Severe-protein calorie malnutrition  ?- Continue current diet and encourage po intake  ? ?Mild Hyponatremia ?Hypokalemia ?-Monitor I&O's / urinary output ?-Follow BMP ?-Ensure adequate renal perfusion ?-Avoid nephrotoxic agents as able ?-Replace electrolytes as indicated ?-Pharmacy following for assistance with electrolyte replacement ? ? ?Best Practice (right click and "Reselect all SmartList Selections" daily)  ?Diet/type: dysphagia diet (see orders) ?DVT prophylaxis: SCD ?GI prophylaxis: N/A ?Lines: Pt with portacath  ?Foley:  N/A ?Code Status:  DNR ?Last date of multidisciplinary goals of care discussion [04/03/21] ? ?Pts family at bedside and updated regarding plan of care all questions answered.  Palliative Care consulted to discuss goals of treatment  ? ?Labs   ?CBC: ?Recent Labs  ?Lab 03/28/21 ?0400 04/01/21 ?1400 04/02/21 ?1638 04/02/21 ?1943 04/03/21 ?0353  ?WBC 7.6 7.1 7.7  --  7.3  ?NEUTROABS  --  6.0  --   --  5.8  ?HGB 8.5* 8.2* 6.8* 8.8* 8.4*  ?HCT 26.1* 26.4* 21.7* 27.3* 25.8*  ?MCV 94.2 97.4 96.4  --  94.2  ?PLT 169 171 148*  --  150  ? ? ? ?Basic Metabolic Panel: ?Recent  Labs  ?Lab 03/28/21 ?0400 04/01/21 ?1400 04/02/21 ?8022 04/03/21 ?0353  ?NA 130* 131*  132* 133*  ?K 3.9 5.3* 4.1 3.1*  ?CL 99 104 103 103  ?CO2 31 21* 24 24  ?GLUCOSE 93 93 99 123*  ?BUN 13 34* 23 14  ?CREATININE 1.00 1.15* 0.91 0.84  ?CALCIUM 7.9* 7.8* 7.6* 7.7*  ?MG  --   --   --  1.4*  ?PHOS  --   --

## 2021-04-03 NOTE — Progress Notes (Signed)
?PROGRESS NOTE ? ? ? ?[name]  PPJ:[id number] DOB: May 10, 1940 DOA: 04/01/2021 ?PCP: Derinda Late, MD  ? ?Brief Narrative:  ?This 81 years old female with PMH significant for stage IIIb squamous cell lung cancer status postresection of left lower lobe but unable to tolerate 4 rounds of chemotherapy as recommended, thick-walled cavitary and posterior left hemithorax thought to be a lung abscess although not yet biopsied, HFrEF and extremely poor premorbid functioning with decreased p.o. intake and protein calorie malnutrition who was discharged 5 days ago after treatment for CAP is now brought in the ED by family for shortness of breath. ?Patient was hypotensive and tachycardic in the ED, CTA chest negative for PE but shows findings suggestive of bronchiectasis and or aspiration/infection. ?Patient is admitted for acute on chronic hypoxic respiratory failure secondary to new bronchiectasis / possible aspiration pneumonia and stage III lung cancer.  She remained hypotensive requiring pressor support.  Patient is admitted in the stepdown, PCCM consulted.  ID is consulted. ? ?Assessment & Plan: ?  ?Principal Problem: ?  Acute on chronic respiratory failure ([place]) ?Active Problems: ?  Community acquired pneumonia complicated by underlying lung cancer, apparent lung abscess on CT Chest  ?  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  S/P CABG x 4 ?  Sinus tachycardia ?  Pressure injury of skin ? ?Acute on chronic hypoxic respiratory failure: ?Likely secondary to new bronchiectasis / possible aspiration in a patient with extremely poor respiratory reserve given left lower lobe resection and known cavitary lesion in the left posterior hemithorax. ?Continue IV antibiotics (doxycycline and Zosyn) ?CTA chest negative for PE but shows new bronchiectasis. ?ID consulted for recommendation on antibiotic regimen. ?Continue supplemental oxygen, continue bronchodilators. ?Aspiration precautions.   Follow-up blood cultures. ?Remains on 2 L of supplemental oxygen sats 100%. ? 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 ?Patient continued to remain hypotensive despite fluid boluses. ?Patient moved to stepdown and started on Levophed support. ?Hemoglobin 6.8, s/p 1 PRBC > Hb 8.8 > 8.4 ?Continue empiric antibiotic doxycycline and Zosyn.  follow blood cultures ?Hold antihypertensive medications. ?Continue midodrine. ? ?Chronic systolic heart failure: ?Continue telemetry monitoring. ?Hold Toprol, Lasix, Entresto. ?Last echocardiogram showed LVEF 20 to 25%. ? ?Normochromic normocytic anemia.: ?No signs of any visible bleeding. ?S/p 1 PRBC hemoglobin 8.8 >8.4 ? ?Hyperlipidemia: ?Continue atorvastatin. ? ?CAD: ?Continue aspirin and Plavix ? ?Chronic pain syndrome: ?Continue gabapentin ? ?Squamous cell carcinoma of the lung: ?Continue outpatient follow-up with oncology. ?Oncology consulted Dr. Grayland Ormond. ?Palliative care consulted to discuss goals of care. ? ?Social situation: ?Patient has previously refused SNF placement and has demanded to go home and only allowed her family members to care for her even though she is bedbound.  Family is unable to provide this level of care for at home.  TOC notified.  ? ? ?Pressure Injury 03/22/21 Sacrum Mid Stage 2 -  Partial thickness loss of dermis presenting as a shallow open injury with a red, pink wound bed without slough. (Active)  ?03/22/21 1300  ?Location: Sacrum  ?Location Orientation: Mid  ?Staging: Stage 2 -  Partial thickness loss of dermis presenting as a shallow open injury with a red, pink wound bed without slough.  ?Wound Description (Comments):   ?Present on Admission: Yes  ? ? ?DVT prophylaxis: Lovenox ?Code Status: DNR ?Family Communication: Daughter at bedside ?Disposition Plan:  ? ?Status is: Inpatient ?Remains inpatient appropriate because: Admitted for severe  sepsis and acute on  chronic hypoxic respiratory failure secondary to new bronchiectasis.  Continue IV antibiotics,  pulmonology and ID consulted ?  ? ?Consultants:  ?ID ?Pulmonology ? ?Procedures: CTA chest ?Antimicrobials: ?Anti-infectives (From admission, onward)  ? ? Start     Dose/Rate Route Frequency Ordered Stop  ? 04/03/21 2200  doxycycline (VIBRA-TABS) tablet 100 mg       ? 100 mg Oral Every 12 hours 04/03/21 1421 04/07/21 0959  ? 04/03/21 0945  metroNIDAZOLE (FLAGYL) IVPB 500 mg  Status:  Discontinued       ? 500 mg ?100 mL/hr over 60 Minutes Intravenous Every 8 hours 04/03/21 0848 04/03/21 0925  ? 04/02/21 1900  vancomycin (VANCOREADY) IVPB 500 mg/100 mL  Status:  Discontinued       ? 500 mg ?100 mL/hr over 60 Minutes Intravenous Every 24 hours 04/01/21 2159 04/02/21 1026  ? 04/02/21 1700  ceFEPIme (MAXIPIME) 2 g in sodium chloride 0.9 % 100 mL IVPB  Status:  Discontinued       ? 2 g ?200 mL/hr over 30 Minutes Intravenous Every 24 hours 04/01/21 2159 04/02/21 0842  ? 04/02/21 1400  piperacillin-tazobactam (ZOSYN) IVPB 3.375 g       ? 3.375 g ?12.5 mL/hr over 240 Minutes Intravenous Every 8 hours 04/02/21 1027    ? 04/02/21 1115  doxycycline (VIBRAMYCIN) 100 mg in sodium chloride 0.9 % 250 mL IVPB  Status:  Discontinued       ? 100 mg ?125 mL/hr over 120 Minutes Intravenous 2 times daily 04/02/21 1027 04/03/21 1421  ? 04/02/21 1000  ceFEPIme (MAXIPIME) 2 g in sodium chloride 0.9 % 100 mL IVPB  Status:  Discontinued       ? 2 g ?200 mL/hr over 30 Minutes Intravenous Every 12 hours 04/02/21 0842 04/02/21 1026  ? 04/01/21 1845  vancomycin (VANCOCIN) IVPB 1000 mg/200 mL premix       ? 1,000 mg ?200 mL/hr over 60 Minutes Intravenous  Once 04/01/21 1835 04/01/21 2039  ? 04/01/21 1645  ceFEPIme (MAXIPIME) 2 g in sodium chloride 0.9 % 100 mL IVPB       ? 2 g ?200 mL/hr over 30 Minutes Intravenous  Once 04/01/21 1639 04/01/21 1740  ? ?  ?  ? ?Subjective: ?Patient was seen and examined at bedside.  Overnight events noted. ?She reports  feeling better , she remains on 2 L of oxygen which is her baseline. ? ? ?Objective: ?Vitals:  ? 04/03/21 1030 04/03/21 1100 04/03/21 1130 04/03/21 1200  ?BP: (!) 106/48 (!) 102/51 (!) 106/56 102/60  ?Pulse: 97 93 (!) 104 (!) 113  ?Resp: 11 (!) 28 (!) 21 (!) 22  ?Temp:    98 ?F (36.7 ?C)  ?TempSrc:    Oral  ?SpO2: 97% 100% 100% 100%  ?Weight:      ?Height:      ? ? ?Intake/Output Summary (Last 24 hours) at 04/03/2021 1449 ?Last data filed at 04/03/2021 1200 ?Gross per 24 hour  ?Intake 672.41 ml  ?Output 550 ml  ?Net 122.41 ml  ? ?Filed Weights  ? 04/01/21 1349 04/02/21 0828  ?Weight: 43.1 kg 42.6 kg  ? ? ?Examination: ? ?General exam: Thin, frail, elderly, appears comfortable,  not in any distress, remains on oxygen. ?Respiratory system: CTA bilaterally, no wheezing, no crackles, normal respiratory effort. ?Cardiovascular system: S1-S2 heard, regular rate and rhythm, no murmur. ?Gastrointestinal system: Abdomen is soft, non tender, non distended, BS+ ?Central nervous system: Alert and oriented x 2.  No focal neurological deficits. ?Extremities: No edema, no cyanosis, no clubbing. ?Skin: No rashes, lesions or ulcers ?Psychiatry: Judgement and insight appear normal. Mood & affect appropriate.  ? ? ? ?Data Reviewed: I have personally reviewed following labs and imaging studies ? ?CBC: ?Recent Labs  ?Lab 03/28/21 ?0400 04/01/21 ?1400 04/02/21 ?3662 04/02/21 ?1943 04/03/21 ?0353  ?WBC 7.6 7.1 7.7  --  7.3  ?NEUTROABS  --  6.0  --   --  5.8  ?HGB 8.5* 8.2* 6.8* 8.8* 8.4*  ?HCT 26.1* 26.4* 21.7* 27.3* 25.8*  ?MCV 94.2 97.4 96.4  --  94.2  ?PLT 169 171 148*  --  150  ? ?Basic Metabolic Panel: ?Recent Labs  ?Lab 03/28/21 ?0400 04/01/21 ?1400 04/02/21 ?9476 04/03/21 ?0353  ?NA 130* 131* 132* 133*  ?K 3.9 5.3* 4.1 3.1*  ?CL 99 104 103 103  ?CO2 31 21* 24 24  ?GLUCOSE 93 93 99 123*  ?BUN 13 34* 23 14  ?CREATININE 1.00 1.15* 0.91 0.84  ?CALCIUM 7.9* 7.8* 7.6* 7.7*  ?MG  --   --   --  1.4*  ?PHOS  --   --   --  2.9   ? ?GFR: ?Estimated Creatinine Clearance: 37.1 mL/min (by C-G formula based on SCr of 0.84 mg/dL). ?Liver Function Tests: ?Recent Labs  ?Lab 04/01/21 ?1400 04/02/21 ?0541  ?AST 30 48*  ?ALT 12 14  ?ALKPHOS 54 46  ?BILITOT 0.7 0

## 2021-04-03 NOTE — TOC Progression Note (Signed)
Transition of Care (TOC) - Progression Note  ? ? ?Patient Details  ?Name: [name] ?MRN: [id number] ?Date of Birth: [birth year] ? ?Transition of Care (TOC) CM/SW Contact  ?Anselm Pancoast, RN ?Phone Number: ?04/03/2021, 3:16 PM ? ?Clinical Narrative:    ?Fl2 sent out for bed review, PASRR# [id number] A completed.  ? ? ?Expected Discharge Plan: [place] ?Barriers to Discharge: No Barriers Identified ? ?Expected Discharge Plan and Services ?Expected Discharge Plan: [place] ?  ?  ?  ?Living arrangements for the past 2 months: [place] ?                ?  ?  ?  ?  ?  ?  ?  ?  ?  ?  ? ? ?Social Determinants of Health (SDOH) Interventions ?  ? ?Readmission Risk Interventions ?Readmission Risk Prevention Plan 03/26/2021 05/09/2020  ?Post Dischage Appt - Complete  ?Medication Screening - Complete  ?Transportation Screening Complete Complete  ?Medication Review Press photographer) Complete -  ?PCP or Specialist appointment within 3-5 days of discharge Complete -  ?SW Recovery Care/Counseling Consult Complete -  ?Palliative Care Screening Not Applicable -  ?[place] Not Applicable -  ?Some recent data might be hidden  ? ? ?

## 2021-04-03 NOTE — Consult Note (Addendum)
? ?                                                                                ?Consultation Note ?Date: 04/03/2021  ? ?Patient Name: [name]  ?DOB: Feb 13, 1940  MRN: [id number]  Age / Sex: 81 y.o., female  ?PCP: Derinda Late, MD ?Referring Physician: Shawna Clamp, MD ? ?Reason for Consultation: Establishing goals of care ? ?HPI/Patient Profile: 81 y.o. female with PMHx of Stage 3 Lung cancer and recent hospitalization for Multilobar Pneumonia with suspected lung abscess, admitted with Acute on Chronic Hypoxic Respiratory Failure in setting of acute exacerbation of Bronchiectasis vs. Possible aspiration.  Course complicated by hypotension attributed to anemia and developing sepsis.  ? ?Clinical Assessment and Goals of Care: ?Patient is sitting in bedside chair. Family member sitting at bedside. Patient has just finished some apple sauce. Patient with persistent cough making it difficult to speak with me.  ? ?She states she is widowed. She has a son and daughter who live next door and behind her. At baseline she is full independent and drives.  ? ?She discusses she has 2 treatments of chemo left before follow up with oncology, to determine care from there. She states she has felt very weak from chemo.  ? ?Broached GOC. She states "I'm gonna have to get a lot sicker than this to think about that." She confirms DNR status. Discussed her coughing, and question if there is an association with oral intake. Discussed poor PO intake overall. She states she would be amenable to a feeding tube. She adds "do whatever it takes to get me better."    ? ?Have requested Oncology to see her, and then will follow up for further conversation.   ? ?SUMMARY OF RECOMMENDATIONS   ?Patient confirms DNR, no CPR or the ACLS associated with CPR. Otherwise she wants full scope treatment.  ? ?Prognosis:  ?Very poor.  ? ?  ? ?Primary Diagnoses: ?Present on Admission: ? Acute on chronic respiratory failure (HCC) ? Community  acquired pneumonia complicated by underlying lung cancer, apparent lung abscess on CT Chest  ? 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 Sinus tachycardia ? Pressure injury of skin ? ? ?I have reviewed the medical record, interviewed the patient and family, and examined the patient. The following aspects are pertinent. ? ?Past Medical History:  ?Diagnosis Date  ? Arthritis   ? Breast cancer [facility]) 2011  ? radiation- Left  ? CHF (congestive heart failure) ([place])   ? Coronary artery disease   ? Dyspnea   ? Hyperlipidemia   ? Hypertension   ? Myocardial infarction [facility])   ? Personal history of radiation therapy   ? PVD (peripheral vascular disease) ([place])   ? ?Social History  ? ?Socioeconomic History  ? Marital status: Widowed  ?  Spouse name: Not on file  ? Number of children: Not on file  ? Years of education: Not on file  ? Highest education level: Not on file  ?Occupational History  ? Not on file  ?Tobacco Use  ? Smoking status: Former  ? Smokeless tobacco: Never  ?Vaping Use  ? Vaping Use: Never  used  ?Substance and Sexual Activity  ? Alcohol use: No  ?  Alcohol/week: 0.0 standard drinks  ? Drug use: No  ? Sexual activity: Not Currently  ?Other Topics Concern  ? Not on file  ?Social History Narrative  ? Not on file  ? ?Social Determinants of Health  ? ?Financial Resource Strain: Not on file  ?Food Insecurity: Not on file  ?Transportation Needs: Not on file  ?Physical Activity: Not on file  ?Stress: Not on file  ?Social Connections: Not on file  ? ?Family History  ?Problem Relation Age of Onset  ? Hypertension Mother   ? Arthritis Mother   ? Gout Mother   ? Lung cancer Father   ? Heart attack Sister   ? ?Scheduled Meds: ? aspirin EC  81 mg Oral Daily  ? atorvastatin  20 mg Oral QHS  ? Chlorhexidine Gluconate Cloth  6 each Topical Q0600  ? clopidogrel  75 mg Oral Daily  ? gabapentin  100 mg Oral BID  ? midodrine  5 mg Oral TID WC  ? potassium chloride  40 mEq Oral Once  ? ?Continuous  Infusions: ? sodium chloride Stopped (04/02/21 1508)  ? sodium chloride    ? doxycycline (VIBRAMYCIN) IV 125 mL/hr at 04/03/21 1000  ? norepinephrine (LEVOPHED) Adult infusion Stopped (04/03/21 0805)  ? piperacillin-tazobactam (ZOSYN)  IV 12.5 mL/hr at 04/03/21 0644  ? ?PRN Meds:.acetaminophen, albuterol, benzonatate, bisacodyl, magnesium hydroxide ?Medications Prior to Admission:  ?Prior to Admission medications   ?Medication Sig Start Date End Date Taking? Authorizing Provider  ?aspirin EC 81 MG tablet Take 81 mg by mouth daily.   Yes [provider]  ?atorvastatin (LIPITOR) 20 MG tablet Take 1 tablet (20 mg total) by mouth at bedtime. 06/22/20  Yes Marrianne Mood D, PA-C  ?Calcium Carbonate-Vit D-Min (CALCIUM 600+D3 PLUS MINERALS) 600-800 MG-UNIT TABS Take 1 tablet by mouth daily.   Yes [provider]  ?cefUROXime (CEFTIN) 500 MG tablet Take 1 tablet (500 mg total) by mouth 2 (two) times daily with a meal. 03/28/21 04/27/21 Yes Bonnielee Haff, MD  ?clopidogrel (PLAVIX) 75 MG tablet Take 1 tablet (75 mg total) by mouth daily. 05/22/20 05/17/21 Yes Visser, Jacquelyn D, PA-C  ?furosemide (LASIX) 40 MG tablet Take 1 tablet (40 mg total) by mouth daily. 07/29/20  Yes Dahal, Marlowe Aschoff, MD  ?gabapentin (NEURONTIN) 100 MG capsule Take 1 capsule (100 mg total) by mouth 3 (three) times daily. ?Patient taking differently: Take 100 mg by mouth 2 (two) times daily. 12/12/20  Yes Melrose Nakayama, MD  ?Magnesium 500 MG TABS Take 1 tablet by mouth daily.   Yes [provider]  ?metoprolol succinate (TOPROL-XL) 50 MG 24 hr tablet Take 1 tablet (50 mg total) by mouth daily. Take with or immediately following a meal. 01/17/21  Yes Dunn, Areta Haber, PA-C  ?metroNIDAZOLE (FLAGYL) 500 MG tablet Take 1 tablet (500 mg total) by mouth every 12 (twelve) hours. 03/28/21 04/27/21 Yes Bonnielee Haff, MD  ?Multiple Vitamin (MULTIVITAMIN WITH MINERALS) TABS tablet Take 1 tablet by mouth daily.   Yes [provider]   ?potassium chloride (KLOR-CON M) 10 MEQ tablet Take 2 tablets (20 mEq total) by mouth 2 (two) times daily. 02/07/21  Yes Lloyd Huger, MD  ?sacubitril-valsartan (ENTRESTO) 49-51 MG Take 1 tablet by mouth 2 (two) times daily. 02/09/21  Yes Dunn, Areta Haber, PA-C  ?acetaminophen (TYLENOL) 500 MG tablet Take 1,000 mg by mouth every 6 (six) hours as needed for moderate pain  or headache.    [provider]  ?diphenhydrAMINE-zinc acetate (BENADRYL) cream Apply 1 application topically daily as needed for itching.    [provider]  ?lidocaine-prilocaine (EMLA) cream Apply 1 application topically as needed. Apply to port and cover with saran wrap 1-2 hours prior to port access 01/16/21   Lloyd Huger, MD  ?loratadine (CLARITIN) 10 MG tablet Take 10 mg by mouth daily as needed for allergies.    [provider]  ?nitroGLYCERIN (NITROSTAT) 0.4 MG SL tablet Place 1 tablet (0.4 mg total) under the tongue every 5 (five) minutes as needed for up to 10 days for chest pain. 07/28/20   Terrilee Croak, MD  ?ondansetron (ZOFRAN) 8 MG tablet Take 1 tablet (8 mg total) by mouth 2 (two) times daily as needed (Nausea or vomiting). 01/17/21   Lloyd Huger, MD  ? ?Allergies  ?Allergen Reactions  ? Ibuprofen Hives  ? Aleve [Naproxen Sodium] Hives  ? Avapro  [Irbesartan] Rash  ? Azithromycin Hives  ? Nickel Itching  ?  Redness, itching and fluid discharge with nickel earrings  ? ?Review of Systems  ?Constitutional:  Positive for activity change.  ?Respiratory:  Positive for cough.   ? ?Physical Exam ?Pulmonary:  ?   Comments: Cough ?Neurological:  ?   Mental Status: She is alert.  ? ? ?Vital Signs: BP 102/60 (BP Location: Right Arm)   Pulse (!) 113   Temp 98 ?F (36.7 ?C) (Oral)   Resp (!) 22   Ht 5' (1.524 m)   Wt 42.6 kg   SpO2 100%   BMI 18.34 kg/m?  ?Pain Scale: 0-10 ?  ?Pain Score: 0-No pain ? ? ?SpO2: SpO2: 100 % ?O2 Device:SpO2: 100 % ?O2 Flow Rate: .O2 Flow Rate (L/min): 2 L/min ? ?IO:  Intake/output summary:  ?Intake/Output Summary (Last 24 hours) at 04/03/2021 1234 ?Last data filed at 04/03/2021 1200 ?Gross per 24 hour  ?Intake 992.91 ml  ?Output 550 ml  ?Net 442.91 ml  ? ? ?LBM: Last BM Date : 04/03/21 ?Baseline

## 2021-04-03 NOTE — TOC Initial Note (Signed)
Transition of Care (TOC) - Initial/Assessment Note  ? ? ?Patient Details  ?Name: [name] ?MRN: [id number] ?Date of Birth: 1940/12/16 ? ?Transition of Care (TOC) CM/SW Contact:    ?Anselm Pancoast, RN ?Phone Number: ?04/03/2021, 2:38 PM ? ?Clinical Narrative:                 ?Spoke to daughter who reports patient lives alone and has strong family support but family has not been able to provide needed assistance. Family is seeking SNF to help patient build back strength in order to return home. Daughter reports family wants to remain involved and assist as needed. Prefers SNF in [facility] but understands possible limitations on bed availability.  ? ?Expected Discharge Plan: [place] ?Barriers to Discharge: No Barriers Identified ? ? ?Patient Goals and CMS Choice ?Patient states their goals for this hospitalization and ongoing recovery are:: Discharge to SNF for rehab ?CMS Medicare.gov Compare Post Acute Care list provided to:: Patient Represenative (must comment) ?Choice offered to / list presented to : Adult Children ? ?Expected Discharge Plan and Services ?Expected Discharge Plan: [place] ?  ?  ?  ?Living arrangements for the past 2 months: [place] ?                ?  ?  ?  ?  ?  ?  ?  ?  ?  ?  ? ?Prior Living Arrangements/Services ?Living arrangements for the past 2 months: [place] ?Lives with:: Self ?Patient language and need for interpreter reviewed:: Yes ?Do you feel safe going back to the place where you live?: Yes      ?Need for Family Participation in Patient Care: Yes (Comment) ?Care giver support system in place?: Yes (comment) ?Current home services: DME ?Criminal Activity/Legal Involvement Pertinent to Current Situation/Hospitalization: No - Comment as needed ? ?Activities of Daily Living ?Home Assistive Devices/Equipment: Bedside commode/3-in-1, Built-in shower seat, Cane (specify quad or straight), Oxygen, Obriant (specify type) ?ADL  Screening (condition at time of admission) ?Patient's cognitive ability adequate to safely complete daily activities?: Yes ?Is the patient deaf or have difficulty hearing?: No ?Does the patient have difficulty seeing, even when wearing glasses/contacts?: No ?Does the patient have difficulty concentrating, remembering, or making decisions?: No ?Patient able to express need for assistance with ADLs?: Yes ?Does the patient have difficulty dressing or bathing?: Yes ?Independently performs ADLs?: No ?Communication: Independent ?Dressing (OT): Needs assistance ?Is this a change from baseline?: Pre-admission baseline ?Grooming: Independent ?Feeding: Independent ?Bathing: Needs assistance ?Is this a change from baseline?: Pre-admission baseline ?Toileting: Needs assistance ?Is this a change from baseline?: Pre-admission baseline ?In/Out Bed: Needs assistance ?Is this a change from baseline?: Pre-admission baseline ?Walks in Home: Dependent ?Is this a change from baseline?: Change from baseline, expected to last >3 days ?Does the patient have difficulty walking or climbing stairs?: Yes ?Weakness of Legs: Both ?Weakness of Arms/Hands: None ? ?Permission Sought/Granted ?Permission sought to share information with : Customer service manager ?Permission granted to share information with : Yes, Verbal Permission Granted ? Share Information with NAME: Bernette Mayers ?   ? Permission granted to share info w Relationship: Daughter ?   ? ?Emotional Assessment ?Appearance:: Appears stated age ?Attitude/Demeanor/Rapport: Engaged, Gracious ?Affect (typically observed): Accepting, Appropriate ?Orientation: : Oriented to Self, Oriented to Place, Oriented to  Time, Oriented to Situation ?Alcohol / Substance Use: Not Applicable ?Psych Involvement: No (comment) ? ?Admission diagnosis:  SOB (shortness of breath) [R06.02] ?HCAP (  healthcare-associated pneumonia) [J18.9] ?Acute on chronic respiratory failure ([place]) [J96.20] ?Patient Active  Problem List  ? Diagnosis Date Noted  ? Acute on chronic respiratory failure ([place]) 04/01/2021  ? Cavitary pneumonia   ? Hypomagnesemia 03/24/2021  ? Protein-calorie malnutrition, severe 03/23/2021  ? Pressure injury of skin 03/22/2021  ? Acute anemia 03/22/2021  ? Community acquired pneumonia complicated by underlying lung cancer, apparent lung abscess on CT Chest  03/21/2021  ? Acute on chronic systolic CHF (congestive heart failure) ([place]) 03/21/2021  ? Sinus tachycardia 03/21/2021  ? Sepsis with acute hypoxic respiratory failure without septic shock ([place]) 03/21/2021  ? Hyponatremia 03/21/2021  ? 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 S/P partial lobectomy of lung 11/13/2020  ? Unstable angina (HCC)   ? COVID-19 virus infection   ? Acute on chronic HFrEF (heart failure with reduced ejection fraction) ([place]) 07/24/2020  ? Coronary artery disease s/p CABG 07/24/2020  ? Acute respiratory failure with hypoxia ([place]) 07/24/2020  ? Acute exacerbation of CHF (congestive heart failure) ([place]) 07/24/2020  ? 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 05/30/2020  ? 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  ? NSTEMI (non-ST elevated myocardial infarction) ([place]) 04/30/2020  ? Elevated troponin 04/30/2020  ? History of left breast cancer 11/25/2019  ? Hyperlipidemia 11/25/2019  ? Carotid stenosis 08/24/2017  ? 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  ? Pain 01/22/2016  ? 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 PAD (peripheral artery disease) ([place]) 11/22/2014  ? H/O malignant neoplasm of breast 11/22/2014  ? HLD (hyperlipidemia) 11/22/2014  ? Essential hypertension 11/22/2014  ? 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 ?PCP:  Derinda Late, MD ?Pharmacy:   ?[facility] ?[place] [address] ?Phone: [phone] Fax: [phone] ? ? ? ? ?Social Determinants of Health (SDOH) Interventions ?  ? ?Readmission Risk  Interventions ?Readmission Risk Prevention Plan 03/26/2021 05/09/2020  ?Post Dischage Appt - Complete  ?Medication Screening - Complete  ?Transportation Screening Complete Complete  ?Medication Review Press photographer) Complete -  ?PCP or Specialist appointment within 3-5 days of discharge Complete -  ?SW Recovery Care/Counseling Consult Complete -  ?Palliative Care Screening Not Applicable -  ?[place] Not Applicable -  ?Some recent data might be hidden  ? ? ? ?

## 2021-04-03 NOTE — Progress Notes (Signed)
PHARMACIST - PHYSICIAN COMMUNICATION ? ?CONCERNING: Antibiotic IV to Oral Route Change Policy ? ?RECOMMENDATION: ?This patient is receiving doxycycline by the intravenous route.  Based on criteria approved by the Pharmacy and Therapeutics Committee, the antibiotic(s) is/are being converted to the equivalent oral dose form(s). ? ? ?DESCRIPTION: ?These criteria include: ?Patient being treated for a respiratory tract infection, urinary tract infection, cellulitis or clostridium difficile associated diarrhea if on metronidazole ?The patient is not neutropenic and does not exhibit a GI malabsorption state ?The patient is eating (either orally or via tube) and/or has been taking other orally administered medications for a least 24 hours ?The patient is improving clinically and has a Tmax < 100.5 ? ?If you have questions about this conversion, please contact the Pharmacy Department  ? ?Benita Gutter  ?04/03/21  ?  ?

## 2021-04-03 NOTE — Plan of Care (Signed)
°  Problem: Coping: °Goal: Level of anxiety will decrease °Outcome: Progressing °  °

## 2021-04-03 NOTE — Evaluation (Signed)
Physical Therapy Evaluation ?Patient Details ?Name: [name] ?MRN: [id number] ?DOB: 04-24-1940 ?Today's Date: 04/03/2021 ? ?History of Present Illness ? 81 yo F presenting to [facility] ED on 04/01/21 with complaints of shortness of breath, weakness and a productive cough. On arrival, the patient was hypotensive and required an increase in supplemental oxygen from chronic 2 L [place] to 4 L [place]. Pt was initially admitted to the [facility] unit with acute on chronic hypoxic respiratory failure secondary to new bronchiectasis/possible aspiration, however, she became hypotensive, hgb 6.8 requiring 1 unit of pRBC's and levophed gtt. 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 Pt is a pleasant 81 year old female who was admitted for acute/chronic resp failure. Pt performs bed mobility/transfers with cga and ambulation with cga and RW. Pt demonstrates deficits with fatigue/endurance/mobility. Pt is currently not at baseline and was recently admitted for similar symptoms. All mobility performed on 2L of O2 with sats at 100%, however HR elevated to 120bpm with short distance ambulation. Would benefit from skilled PT to address above deficits and promote optimal return to PLOF; recommend transition to STR upon discharge from acute hospitalization. ? ?   ? ?Recommendations for follow up therapy are one component of a multi-disciplinary discharge planning process, led by the attending physician.  Recommendations may be updated based on patient status, additional functional criteria and insurance authorization. ? ?Follow Up Recommendations Skilled nursing-short term rehab (<3 hours/day) ? ?  ?Assistance Recommended at Discharge Intermittent Supervision/Assistance  ?Patient can return home with the following ? A little help with walking and/or transfers;A little help with bathing/dressing/bathroom;Assist for transportation;Help with stairs or ramp  for entrance ? ?  ?Equipment Recommendations None recommended by PT  ?Recommendations for Other Services ?    ?  ?Functional Status Assessment Patient has had a recent decline in their functional status and demonstrates the ability to make significant improvements in function in a reasonable and predictable amount of time.  ? ?  ?Precautions / Restrictions Precautions ?Precautions: Fall ?Precaution Comments: monitor BP ?Restrictions ?Weight Bearing Restrictions: No  ? ?  ? ?Mobility ? Bed Mobility ?  ?  ?  ?  ?  ?  ?  ?General bed mobility comments: not performed as received in recliner ?  ? ?Transfers ?Overall transfer level: Needs assistance ?Equipment used: Rolling Hussar (2 wheels) ?Transfers: Sit to/from Stand ?Sit to Stand: Min guard ?  ?  ?  ?  ?  ?General transfer comment: cues for pushing from seated surface. Once standing, RW used. All mobility performed on 2L of O2. ?  ? ?Ambulation/Gait ?Ambulation/Gait assistance: Min guard ?Gait Distance (Feet): 20 Feet ?Assistive device: Rolling Geer (2 wheels) ?Gait Pattern/deviations: Step-to pattern ?  ?  ?  ?General Gait Details: needs max assist for encouragement. Pt fatigues quickly with limited ambulation in room. RW used with cues for obstacle avoidance. No LOB noted ? ?Stairs ?  ?  ?  ?  ?  ? ?Wheelchair Mobility ?  ? ?Modified Rankin (Stroke Patients Only) ?  ? ?  ? ?Balance Overall balance assessment: Needs assistance ?Sitting-balance support: No upper extremity supported, Feet unsupported ?Sitting balance-Leahy Scale: Fair ?  ?  ?Standing balance support: Bilateral upper extremity supported ?Standing balance-Leahy Scale: Fair ?  ?  ?  ?  ?  ?  ?  ?  ?  ?  ?  ?  ?   ? ? ? ?Pertinent  Vitals/Pain Pain Assessment ?Pain Assessment: No/denies pain  ? ? ?Home Living Family/patient expects to be discharged to:: Private residence ?Living Arrangements: Alone ?Available Help at Discharge: Family;Available PRN/intermittently (son & daughter to help PRN, but are  available 24/7 as they work) ?Type of Home: House ?Home Access: Stairs to enter ?Entrance Stairs-Rails: Left ?Entrance Stairs-Number of Steps: 3 ?  ?Home Layout: Able to live on main level with bedroom/bathroom;Two level ?Home Equipment: Rolling Akhtar (2 wheels);BSC/3in1;Cane - single point;Shower seat - built in;Grab bars - tub/shower ?Additional Comments: she has DME but has not needed to use it in the past  ?  ?Prior Function Prior Level of Function : Independent/Modified Independent;Driving ?  ?  ?  ?  ?  ?  ?Mobility Comments: independent at baseline, however since previous hospital admission, son report's pt spends most of her day sleeping in the recliner. Reports she briefly had HHPT, however they aren't active anymore ?ADLs Comments: independent at baseline. however since previous hospital admission, son report's pt requires physical assistance for all ADLs from daughter ?  ? ? ?Hand Dominance  ?   ? ?  ?Extremity/Trunk Assessment  ? Upper Extremity Assessment ?Upper Extremity Assessment: Generalized weakness (B UE grossly 3/5) ?  ? ?Lower Extremity Assessment ?Lower Extremity Assessment: Generalized weakness (B LE grossly 3+/5) ?  ? ?   ?Communication  ? Communication: No difficulties  ?Cognition Arousal/Alertness: Lethargic ?Behavior During Therapy: Flat affect ?Overall Cognitive Status: Impaired/Different from baseline ?  ?  ?  ?  ?  ?  ?  ?  ?  ?  ?  ?  ?  ?  ?  ?  ?General Comments: alert and oriented and follows commands. Very sleepy, however agreeable to limited evaluation. ?  ?  ? ?  ?General Comments General comments (skin integrity, edema, etc.): While on 3L of O2, SpO2 >90% throughout. HR 110s-120s during activity (MAX 125bpm during stand pivot transfer). BP supine: 119/52, BP sitting EOB 106/48, BP sitting in recliner following stand pivot transfer: 110/54. ? ?  ?Exercises    ? ?Assessment/Plan  ?  ?PT Assessment Patient needs continued PT services  ?PT Problem List Decreased strength;Decreased  range of motion;Decreased activity tolerance;Decreased balance;Decreased mobility ? ?   ?  ?PT Treatment Interventions DME instruction;Gait training;Stair training;Therapeutic activities;Functional mobility training;Therapeutic exercise;Balance training;Neuromuscular re-education;Cognitive remediation;Patient/family education   ? ?PT Goals (Current goals can be found in the Care Plan section)  ?Acute Rehab PT Goals ?Patient Stated Goal: I want to take a nap ?PT Goal Formulation: With patient ?Time For Goal Achievement: 04/17/21 ?Potential to Achieve Goals: Good ? ?  ?Frequency Min 2X/week ?  ? ? ?Co-evaluation   ?  ?  ?  ?  ? ? ?  ?AM-PAC PT "6 Clicks" Mobility  ?Outcome Measure Help needed turning from your back to your side while in a flat bed without using bedrails?: A Little ?Help needed moving from lying on your back to sitting on the side of a flat bed without using bedrails?: A Little ?Help needed moving to and from a bed to a chair (including a wheelchair)?: A Little ?Help needed standing up from a chair using your arms (e.g., wheelchair or bedside chair)?: A Little ?Help needed to walk in hospital room?: A Little ?Help needed climbing 3-5 steps with a railing? : A Lot ?6 Click Score: 17 ? ?  ?End of Session Equipment Utilized During Treatment: Gait belt;Oxygen ?Activity Tolerance: Patient limited by fatigue ?Patient left: in chair;with family/visitor  present ?Nurse Communication: Mobility status ?PT Visit Diagnosis: Muscle weakness (generalized) (M62.81);Unsteadiness on feet (R26.81) ?  ? ?Time: 9470-7615 ?PT Time Calculation (min) (ACUTE ONLY): 16 min ? ? ?Charges:   PT Evaluation ?$PT Eval Low Complexity: 1 Low ?PT Treatments ?$Gait Training: 8-22 mins ?  ?   ? ? ?Greggory Stallion, PT, DPT, GCS ?[phone] ? ? ?Osa Campoli ?04/03/2021, 12:57 PM ? ?

## 2021-04-03 NOTE — [place] FL2 (Signed)
?[place] MEDICAID FL2 LEVEL OF CARE SCREENING TOOL  ?  ? ?IDENTIFICATION  ?Patient Name: ?[name] Birthdate: 1940-09-13 Sex: female Admission Date (Current Location): ?04/01/2021  ?[place] and [place] Number: ? [place] ?  Facility and Address:  ?[facility], [address], [place], [place] [address] ?     Provider Number: ?[id number]  ?Attending Physician Name and Address:  ?Shawna Clamp, MD ? Relative Name and Phone Number:  ?[phone] ?   ?Current Level of Care: ?Hospital Recommended Level of Care: ?[facility] Prior Approval Number: ?  ? ?Date Approved/Denied: ?  PASRR Number: ?Pending ? ?Discharge Plan: ?SNF ?  ? ?Current Diagnoses: ?Patient Active Problem List  ? Diagnosis Date Noted  ? Acute on chronic respiratory failure ([place]) 04/01/2021  ? Cavitary pneumonia   ? Hypomagnesemia 03/24/2021  ? Protein-calorie malnutrition, severe 03/23/2021  ? Pressure injury of skin 03/22/2021  ? Acute anemia 03/22/2021  ? Community acquired pneumonia complicated by underlying lung cancer, apparent lung abscess on CT Chest  03/21/2021  ? Acute on chronic systolic CHF (congestive heart failure) ([place]) 03/21/2021  ? Sinus tachycardia 03/21/2021  ? Sepsis with acute hypoxic respiratory failure without septic shock ([place]) 03/21/2021  ? Hyponatremia 03/21/2021  ? 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 S/P partial lobectomy of lung 11/13/2020  ? Unstable angina (HCC)   ? COVID-19 virus infection   ? Acute on chronic HFrEF (heart failure with reduced ejection fraction) ([place]) 07/24/2020  ? Coronary artery disease s/p CABG 07/24/2020  ? Acute respiratory failure with hypoxia ([place]) 07/24/2020  ? Acute exacerbation of CHF (congestive heart failure) ([place]) 07/24/2020  ? 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 05/30/2020  ? 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  ? NSTEMI (non-ST elevated myocardial infarction) ([place]) 04/30/2020  ? Elevated troponin  04/30/2020  ? History of left breast cancer 11/25/2019  ? Hyperlipidemia 11/25/2019  ? Carotid stenosis 08/24/2017  ? 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  ? Pain 01/22/2016  ? 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 PAD (peripheral artery disease) ([place]) 11/22/2014  ? H/O malignant neoplasm of breast 11/22/2014  ? HLD (hyperlipidemia) 11/22/2014  ? Essential hypertension 11/22/2014  ? 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 ? ?Orientation RESPIRATION BLADDER Height & Weight   ?  ?Self, Time, Situation, Place ? Normal Continent (Incontinent at times) Weight: 42.6 kg ?Height:  5' (152.4 cm)  ?BEHAVIORAL SYMPTOMS/MOOD NEUROLOGICAL BOWEL NUTRITION STATUS  ?    Continent Diet (regular)  ?AMBULATORY STATUS COMMUNICATION OF NEEDS Skin   ?Limited Assist Verbally Normal ?  ?  ?  ?    ?     ?     ? ? ?Personal Care Assistance Level of Assistance  ?Bathing, Feeding, Dressing Bathing Assistance: Limited assistance ?Feeding assistance: Limited assistance ?Dressing Assistance: Limited assistance ?   ? ?Functional Limitations Info  ?    ?  ?   ? ? ?SPECIAL CARE FACTORS FREQUENCY  ?PT (By licensed PT), OT (By licensed OT)   ?  ?PT Frequency: min 5xweek ?OT Frequency: min 5xweek ?  ?  ?  ?   ? ? ?Contractures    ? ? ?Additional Factors Info  ?    ?  ?  ?  ?  ?   ? ?Current Medications (04/03/2021):  This is the current hospital active medication list ?Current Facility-Administered Medications  ?Medication Dose Route Frequency Provider Last Rate Last  Admin  ? 0.9 %  sodium chloride infusion  250 mL Intravenous Continuous Rust-Chester, Huel Cote, NP   Stopped at 04/02/21 1508  ? 0.9 %  sodium chloride infusion  250 mL Intravenous Continuous Teressa Lower, NP      ? acetaminophen (TYLENOL) tablet 1,000 mg  1,000 mg Oral Q6H PRN Bonnell Public Tublu, MD      ? albuterol (PROVENTIL) (2.5 MG/3ML) 0.083% nebulizer solution 2.5 mg  2.5 mg Nebulization Q2H PRN Vashti Hey, MD      ? aspirin EC tablet  81 mg  81 mg Oral Daily Bonnell Public Tublu, MD   81 mg at 04/03/21 0932  ? atorvastatin (LIPITOR) tablet 20 mg  20 mg Oral QHS Bonnell Public Tublu, MD   20 mg at 04/02/21 2118  ? benzonatate (TESSALON) capsule 200 mg  200 mg Oral BID PRN Shawna Clamp, MD   200 mg at 04/03/21 1111  ? bisacodyl (DULCOLAX) suppository 10 mg  10 mg Rectal Daily PRN Vashti Hey, MD      ? Chlorhexidine Gluconate Cloth 2 % PADS 6 each  6 each Topical Q0600 Shawna Clamp, MD   6 each at 04/03/21 [phone]  ? clopidogrel (PLAVIX) tablet 75 mg  75 mg Oral Daily Bonnell Public Tublu, MD   75 mg at 04/03/21 0932  ? doxycycline (VIBRA-TABS) tablet 100 mg  100 mg Oral Q12H Benita Gutter, RPH      ? gabapentin (NEURONTIN) capsule 100 mg  100 mg Oral BID Bonnell Public Tublu, MD   100 mg at 04/03/21 0932  ? magnesium hydroxide (MILK OF MAGNESIA) suspension 30 mL  30 mL Oral Daily PRN Vashti Hey, MD      ? midodrine (PROAMATINE) tablet 5 mg  5 mg Oral TID WC Teressa Lower, NP   5 mg at 04/03/21 1221  ? norepinephrine (LEVOPHED) 4mg  in 232mL (0.016 mg/mL) premix infusion  2-10 mcg/min Intravenous Titrated Teressa Lower, NP   Stopped at 04/03/21 0805  ? piperacillin-tazobactam (ZOSYN) IVPB 3.375 g  3.375 g Intravenous Barnabas Lister, MD 12.5 mL/hr at 04/03/21 0644 Infusion Verify at 04/03/21 0644  ? potassium chloride (KLOR-CON) packet 40 mEq  40 mEq Oral Once Shawna Clamp, MD      ? potassium chloride (KLOR-CON) packet 40 mEq  40 mEq Oral Once Shawna Clamp, MD      ? ?Facility-Administered Medications Ordered in Other Encounters  ?Medication Dose Route Frequency Provider Last Rate Last Admin  ? heparin lock flush 100 UNIT/ML injection           ? sodium chloride flush (NS) 0.9 % injection 10 mL  10 mL Intravenous Once Lloyd Huger, MD      ? ? ? ?Discharge Medications: ?Please see discharge summary for a list of discharge medications. ? ?Relevant Imaging Results: ? ?Relevant Lab  Results: ? ? ?Additional Information ?[phone] ? ?Anselm Pancoast, RN ? ? ? ? ?

## 2021-04-04 DIAGNOSIS — I5023 Acute on chronic systolic (congestive) heart failure: Secondary | ICD-10-CM

## 2021-04-04 DIAGNOSIS — J9621 Acute and chronic respiratory failure with hypoxia: Secondary | ICD-10-CM | POA: Diagnosis not present

## 2021-04-04 DIAGNOSIS — Z7189 Other specified counseling: Secondary | ICD-10-CM | POA: Diagnosis not present

## 2021-04-04 DIAGNOSIS — J189 Pneumonia, unspecified organism: Secondary | ICD-10-CM | POA: Diagnosis not present

## 2021-04-04 DIAGNOSIS — A419 Sepsis, unspecified organism: Secondary | ICD-10-CM | POA: Diagnosis not present

## 2021-04-04 DIAGNOSIS — R6521 Severe sepsis with septic shock: Secondary | ICD-10-CM

## 2021-04-04 DIAGNOSIS — J851 Abscess of lung with pneumonia: Secondary | ICD-10-CM | POA: Diagnosis not present

## 2021-04-04 LAB — BRAIN NATRIURETIC PEPTIDE: B Natriuretic Peptide: 1375.5 pg/mL — ABNORMAL HIGH (ref 0.0–100.0)

## 2021-04-04 LAB — CBC
HCT: 23.7 % — ABNORMAL LOW (ref 36.0–46.0)
Hemoglobin: 7.7 g/dL — ABNORMAL LOW (ref 12.0–15.0)
MCH: 30.4 pg (ref 26.0–34.0)
MCHC: 32.5 g/dL (ref 30.0–36.0)
MCV: 93.7 fL (ref 80.0–100.0)
Platelets: 127 10*3/uL — ABNORMAL LOW (ref 150–400)
RBC: 2.53 MIL/uL — ABNORMAL LOW (ref 3.87–5.11)
RDW: 16.7 % — ABNORMAL HIGH (ref 11.5–15.5)
WBC: 5.4 10*3/uL (ref 4.0–10.5)
nRBC: 0 % (ref 0.0–0.2)

## 2021-04-04 LAB — BASIC METABOLIC PANEL
Anion gap: 5 (ref 5–15)
BUN: 11 mg/dL (ref 8–23)
CO2: 22 mmol/L (ref 22–32)
Calcium: 7.6 mg/dL — ABNORMAL LOW (ref 8.9–10.3)
Chloride: 108 mmol/L (ref 98–111)
Creatinine, Ser: 0.93 mg/dL (ref 0.44–1.00)
GFR, Estimated: >60 mL/min (ref >60)
Glucose, Bld: 94 mg/dL (ref 70–99)
Potassium: 3.9 mmol/L (ref 3.5–5.1)
Sodium: 135 mmol/L (ref 135–145)

## 2021-04-04 LAB — PHOSPHORUS: Phosphorus: 2.4 mg/dL — ABNORMAL LOW (ref 2.5–4.6)

## 2021-04-04 LAB — CULTURE, RESPIRATORY W GRAM STAIN

## 2021-04-04 LAB — ACID FAST SMEAR (AFB, MYCOBACTERIA): Acid Fast Smear: NEGATIVE

## 2021-04-04 LAB — PROCALCITONIN: Procalcitonin: 0.11 ng/mL

## 2021-04-04 LAB — MAGNESIUM: Magnesium: 1.6 mg/dL — ABNORMAL LOW (ref 1.7–2.4)

## 2021-04-04 MED ORDER — FUROSEMIDE 10 MG/ML IJ SOLN
40.0000 mg | Freq: Two times a day (BID) | INTRAMUSCULAR | Status: DC
  Administered 2021-04-04 – 2021-04-06 (×4): 40 mg via INTRAVENOUS
  Filled 2021-04-04 (×4): qty 4

## 2021-04-04 MED ORDER — POTASSIUM CHLORIDE CRYS ER 20 MEQ PO TBCR: 20.0000 meq | EXTENDED_RELEASE_TABLET | Freq: Every day | ORAL | Status: DC

## 2021-04-04 MED ORDER — SODIUM CHLORIDE 0.9 % IV SOLN
1.5000 g | Freq: Four times a day (QID) | INTRAVENOUS | Status: DC
  Administered 2021-04-04 – 2021-04-06 (×7): 1.5 g via INTRAVENOUS
  Filled 2021-04-04 (×2): qty 1.5
  Filled 2021-04-04: qty 4
  Filled 2021-04-04: qty 1.5
  Filled 2021-04-04: qty 4
  Filled 2021-04-04 (×3): qty 1.5

## 2021-04-04 MED ORDER — MAGNESIUM SULFATE 2 GM/50ML IV SOLN
2.0000 g | Freq: Once | INTRAVENOUS | Status: AC
  Administered 2021-04-04: 2 g via INTRAVENOUS
  Filled 2021-04-04: qty 50

## 2021-04-04 NOTE — Progress Notes (Addendum)
? ?                                                                                                                                                     ?                                                   ?Daily Progress Note  ? ?Patient Name: [name]       Date: 04/04/2021 ?DOB: [birth year]  Age: 81 y.o. MRN#: [id number] ?Attending Physician: Annita Brod, MD ?Primary Care Physician: Derinda Late, MD ?Admit Date: 04/01/2021 ? ?Reason for Consultation/Follow-up: Establishing goals of care ? ?Subjective: ?Patient is resting in bed, per daughter who is at bedside she did not sleep well with coughing. Discussed current status and concerns. Daughter discusses the cancer s/p resection and lymph node dissection, as well as chemo; discussed oncology should be coming to see them this afternoon. Spoke with SLP, they will revist patient today. ? ?Discussed meeting tomorrow morning at 10:00 at bedside for further conversation.  ? ?Length of Stay: 3 ? ?Current Medications: ?Scheduled Meds:  ? aspirin EC  81 mg Oral Daily  ? atorvastatin  20 mg Oral QHS  ? Chlorhexidine Gluconate Cloth  6 each Topical Q0600  ? clopidogrel  75 mg Oral Daily  ? doxycycline  100 mg Oral Q12H  ? gabapentin  100 mg Oral BID  ? Gerhardt's butt cream   Topical TID  ? midodrine  10 mg Oral TID WC  ? ? ?Continuous Infusions: ? sodium chloride Stopped (04/02/21 1508)  ? sodium chloride    ? norepinephrine (LEVOPHED) Adult infusion Stopped (04/03/21 0805)  ? piperacillin-tazobactam (ZOSYN)  IV 12.5 mL/hr at 04/04/21 0800  ? ? ?PRN Meds: ?acetaminophen, albuterol, benzonatate, bisacodyl, magic mouthwash w/lidocaine, magnesium hydroxide ? ?Physical Exam ?Constitutional:   ?   Comments: Resting with eyes closed  ?Pulmonary:  ?   Effort: Pulmonary effort is normal.  ?         ? ?Vital Signs: BP (!) 112/49   Pulse (!) 104   Temp 97.6 ?F (36.4 ?C) (Oral)   Resp (!) 21   Ht 5' (1.524 m)   Wt 42.6 kg   SpO2 96%   BMI 18.34 kg/m?  ?SpO2: SpO2: 96  % ?O2 Device: O2 Device: Room Air ?O2 Flow Rate: O2 Flow Rate (L/min): 0 L/min ? ?Intake/output summary:  ?Intake/Output Summary (Last 24 hours) at 04/04/2021 0845 ?Last data filed at 04/04/2021 0800 ?Gross per 24 hour  ?Intake 693.52 ml  ?Output 725 ml  ?Net -31.48 ml  ? ?LBM: Last BM Date : 04/04/21 ?Baseline Weight: Weight: 43.1 kg ?Most recent weight:  Weight: 42.6 kg ? ? ? ?Patient Active Problem List  ? Diagnosis Date Noted  ? Acute on chronic respiratory failure ([place]) 04/01/2021  ? Cavitary pneumonia   ? Hypomagnesemia 03/24/2021  ? Protein-calorie malnutrition, severe 03/23/2021  ? Pressure injury of skin 03/22/2021  ? Acute anemia 03/22/2021  ? Community acquired pneumonia complicated by underlying lung cancer, apparent lung abscess on CT Chest  03/21/2021  ? Acute on chronic systolic CHF (congestive heart failure) ([place]) 03/21/2021  ? Sinus tachycardia 03/21/2021  ? Sepsis with acute hypoxic respiratory failure without septic shock ([place]) 03/21/2021  ? Hyponatremia 03/21/2021  ? 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 S/P partial lobectomy of lung 11/13/2020  ? Unstable angina (HCC)   ? COVID-19 virus infection   ? Acute on chronic HFrEF (heart failure with reduced ejection fraction) ([place]) 07/24/2020  ? Coronary artery disease s/p CABG 07/24/2020  ? Acute respiratory failure with hypoxia ([place]) 07/24/2020  ? Acute exacerbation of CHF (congestive heart failure) ([place]) 07/24/2020  ? 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 05/30/2020  ? 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  ? NSTEMI (non-ST elevated myocardial infarction) ([place]) 04/30/2020  ? Elevated troponin 04/30/2020  ? History of left breast cancer 11/25/2019  ? Hyperlipidemia 11/25/2019  ? Carotid stenosis 08/24/2017  ? 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  ? Pain 01/22/2016  ? 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 PAD (peripheral artery disease) ([place]) 11/22/2014  ? H/O malignant neoplasm of breast 11/22/2014  ? HLD  (hyperlipidemia) 11/22/2014  ? Essential hypertension 11/22/2014  ? 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 ? ?Palliative Care Assessment & Plan  ? ? ?Recommendations/Plan: ?Will meet with family tomorrow morning at bedside after they have been able to speak with Oncology, and after SLP has been able to reevaluate.  ? ?Code Status: ? ?  ?Code Status Orders  ?(From admission, onward)  ?  ? ? ?  ? ?  Start     Ordered  ? 04/01/21 1754  Do not attempt resuscitation (DNR)  Continuous       ?Question Answer Comment  ?In the event of cardiac or respiratory ARREST Do not call a ?code blue?   ?In the event of cardiac or respiratory ARREST Do not perform Intubation, CPR, defibrillation or ACLS   ?In the event of cardiac or respiratory ARREST Use medication by any route, position, wound care, and other measures to relive pain and suffering. May use oxygen, suction and manual treatment of airway obstruction as needed for comfort.   ?  ? 04/01/21 1759  ? ?  ?  ? ?  ? ?Code Status History   ? ? Date Active Date Inactive Code Status Order ID Comments User Context  ? 03/21/2021 1448 03/28/2021 2003 Full Code [id number]  Emeterio Reeve, DO ED  ? 11/13/2020 1914 11/22/2020 1538 Full Code [id number]  Antony Odea, PA-C Inpatient  ? 07/24/2020 2333 07/28/2020 2133 Full Code [id number]  Erlene Quan, NP ED  ? 05/01/2020 1932 05/09/2020 2313 Full Code [id number]  Leanor Kail, PA Inpatient  ? 05/01/2020 1128 05/01/2020 1614 Full Code [id number]  Rise Mu, PA-C Inpatient  ? 04/30/2020 1419 05/01/2020 1128 DNR [id number]  Criss Alvine, DO ED  ? 01/24/2016 1905 01/26/2016 1442 Full Code [id number]  Dereck Leep, MD Inpatient  ? ?  ? ?Advance Directive Documentation   ? ?Flowsheet Row Most Recent Value  ?Type of Advance  Warehouse manager  ?Pre-existing out of facility DNR order (yellow form or pink MOST form) --  ?"MOST" Form in Place? --  ? ?  ? ? ?Thank you for allowing the Palliative Medicine Team  to assist in the care of this patient. ? ?25 min ? ?Asencion Gowda, NP ? ?Please contact Palliative Medicine Team phone at [phone] for questions and concerns.  ? ? ? ? ? ?

## 2021-04-04 NOTE — Assessment & Plan Note (Addendum)
BNP on admission at 291.  BNP at 1500 3/22.  Worsening secondary to aggressive fluid resuscitation and came in with septic shock.  We will start IV Lasix.  Echocardiogram in December 2022 notes ejection fraction of 20 to 25%.  She has since diuresed almost 3 L.  Looks to be now more euvolemic.  Unfortunately, patient's blood pressure still soft despite 10 mg 3 times daily of midodrine and that is off of her Entresto and metoprolol.  We will have to discharge on midodrine and discontinue her Entresto and Toprol, but continue her Lasix.  Patient is dying and hospice services are appropriate. ?

## 2021-04-04 NOTE — Progress Notes (Signed)
? ?  Date of Admission:  04/01/2021    ?ID: [name] is a 81 y.o. female Active Problems: ?  S/P CABG x 4 ?  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  Community acquired pneumonia complicated by underlying lung cancer, apparent lung abscess on CT Chest  ?  Chronic systolic CHF (congestive heart failure) ([place]) ?  Sinus tachycardia ?  Hyponatremia ?  Pressure injury of skin ?  Acute on chronic respiratory failure ([place]) ? ?CAD, S/p CABG ?SCC left lung ?CAP ? ?Left lower lobectomy on 11/13/2020 ?Adjuvant chemo planned ? ?Subjective: ?Pt is sleeping ?Granddaughter at bed side ?Says she is not eating or drinking ? ? ?Medications:  ? aspirin EC  81 mg Oral Daily  ? atorvastatin  20 mg Oral QHS  ? Chlorhexidine Gluconate Cloth  6 each Topical Q0600  ? clopidogrel  75 mg Oral Daily  ? doxycycline  100 mg Oral Q12H  ? gabapentin  100 mg Oral BID  ? Gerhardt's butt cream   Topical TID  ? midodrine  10 mg Oral TID WC  ? ? ?Objective: ?Vital signs in last 24 hours: ?Temp:  [97.6 ?F (36.4 ?C)-99.2 ?F (37.3 ?C)] 97.6 ?F (36.4 ?C) (03/22 0730) ?Pulse Rate:  [79-104] 90 (03/22 1000) ?Resp:  [19-32] 24 (03/22 1000) ?BP: (87-124)/(35-69) 115/46 (03/22 1000) ?SpO2:  [93 %-100 %] 96 % (03/22 1000) ? ?PHYSICAL EXAM:  ?Did not examine ?Lab Results ?Recent Labs  ?  04/03/21 ?0353 04/03/21 ?1951 04/04/21 ?0222  ?WBC 7.3  --  5.4  ?HGB 8.4*  --  7.7*  ?HCT 25.8*  --  23.7*  ?NA 133*  --  135  ?K 3.1* 4.2 3.9  ?CL 103  --  108  ?CO2 24  --  22  ?BUN 14  --  11  ?CREATININE 0.84  --  0.93  ? ?Liver Panel ?Recent Labs  ?  04/01/21 ?1400 04/02/21 ?0541  ?PROT 6.4* 5.6*  ?ALBUMIN 2.1* 1.7*  ?AST 30 48*  ?ALT 12 14  ?ALKPHOS 54 46  ?BILITOT 0.7 0.5  ? ?Sedimentation Rate ?No results for input(s): ESRSEDRATE in the last 72 hours. ?C-Reactive Protein ?No results for input(s): CRP in the last 72 hours. ? ?Microbiology: ? ?Studies/Results: ? ? ? ?DG Chest Port 1 View ? ?Result Date: 04/03/2021 ?CLINICAL DATA:  Acute  respiratory failure EXAM: PORTABLE CHEST 1 VIEW COMPARISON:  Chest x-ray dated April 01, 2021 FINDINGS: Visualized cardiac and mediastinal contours are unchanged post median sternotomy CABG. Unchanged position of right chest wall port. Unchanged left greater than right heterogeneous opacities and pleural thickening of the left lung. Apparent increased density of the left lung base is likely due to differences in patient positioning. No large pleural effusion or pneumothorax. IMPRESSION: Left greater than right heterogeneous opacities, similar prior exam. Electronically Signed   By: Yetta Glassman M.D.   On: 04/03/2021 08:12   ? ? ?Assessment/Plan: ? ?Ca lung s/p left lower lobectomy in NOV 2022 ? ?Lung abscess left lower lobe - may need to get Dr.Hendrickson's opinion ?PT currently on zosyn and doxy- change to unasyn ? ? Adenovirus resp infection- supportive treatment  ? ?Anorexia ? ?Hypoalbuminemia ? ?Anemia ? ?CAD S/p CABG ? ?Discussed the management with granddaughter ?Oncology/palliative meeting with family tomorrow ? ? ?

## 2021-04-04 NOTE — Progress Notes (Signed)
Nutrition Follow-up ? ?DOCUMENTATION CODES:  ? ?Severe malnutrition in context of chronic illness ? ?INTERVENTION:  ? ?-Magic cup TID with meals, each supplement provides 290 kcal and 9 grams of protein ? -Vital Cuisine TID, each supplement provides 520kcal and 22g of protein.  ?-MVI po daily  ?-If feeding tube is desired, recommend: ? ?Initiate Osmolite 1.2 @ 20 ml/hr and increase by 10 ml every 12 hours to goal rate of 60 ml/hr.  ? ?80 ml free water every 6 hours ? ?Tube feeding regimen provides 1728 kcal (100% of needs), 80 grams of protein, and 1181 ml of H2O.  Total free water: 1501 ml free water daily ? ?NUTRITION DIAGNOSIS:  ? ?Severe Malnutrition related to cancer and cancer related treatments as evidenced by percent weight loss, severe fat depletion, severe muscle depletion. ? ?Ongoing ? ?GOAL:  ? ?Patient will meet greater than or equal to 90% of their needs ? ?Progressing  ? ?MONITOR:  ? ?PO intake, Supplement acceptance, Labs, Weight trends, Skin, I & O's ? ?REASON FOR ASSESSMENT:  ? ?Malnutrition Screening Tool ?  ? ?ASSESSMENT:  ? ?81 y/o female with h/o MI, CABG x 4, SCC lung s/p XRT/chemo, CAD, PAD, HLD, NSTEMI, HTN, DJD, CHF and breast cancer s/p XRT who is admitted with recurrent PNA. ? ?Reviewed I/O's: -39 ml x 24 hours and +1.5 L since admission ? ?UOP: 725 ml x 24 hours  ? ?Pt with very poor oral intake. Noted meal completions 25%. SLP to follow-up today; noted pt with constant coughing.  ? ?Palliative care consulted for goals of care; plan for family meeting tomorrow after SLP and oncology have seen pt.  ? ?Per Palliative care and MD notes, pt is amenable to feeding tube. Case discussed with MD, palliative care, and SLP; if pt desires aggressive care, recommendation would be for feeding tube. Palliative to meet with them at 1000 tomorrow; per MD and and palliative care, pt does not seem to understand the severity of her illness.  ? ?Medications reviewed.  ? ?Labs reviewed: CBGS: 112. Phos: 2.4,  Mg: 1.6. Phos WDL.   ? ?Diet Order:   ?Diet Order   ? ?       ?  Diet regular Room service appropriate? Yes with Assist; Fluid consistency: Thin  Diet effective now       ?  ? ?  ?  ? ?  ? ? ?EDUCATION NEEDS:  ? ?Education needs have been addressed ? ?Skin:  Skin Assessment: Skin Integrity Issues: ?Skin Integrity Issues:: Stage II ?Stage II: sacrum ? ?Last BM:  04/04/21 ? ?Height:  ? ?Ht Readings from Last 1 Encounters:  ?04/01/21 5' (1.524 m)  ? ? ?Weight:  ? ?Wt Readings from Last 1 Encounters:  ?04/02/21 42.6 kg  ? ? ?Ideal Body Weight:  45.4 kg ? ?BMI:  Body mass index is 18.34 kg/m?. ? ?Estimated Nutritional Needs:  ? ?Kcal:  1500-1700 ? ?Protein:  85-100 grams ? ?Fluid:  > 1.5 L ? ? ? ?Loistine Chance, RD, LDN, CDCES ?Registered Dietitian II ?Certified Diabetes Care and Education Specialist ?Please refer to [facility] for RD and/or RD on-call/weekend/after hours pager  ?

## 2021-04-04 NOTE — Consult Note (Signed)
?[place]  ?[phone] [phone] ? ?ID: [name] OB: 08-02-1940  MR#: [id number]  CSN#:[id number] ? ?Patient Care Team: ?Derinda Late, MD as PCP - General (Family Medicine) ?Wellington Hampshire, MD as PCP - Cardiology (Cardiology) ?Telford Nab, RN as Sales executive ?Lloyd Huger, MD as Consulting Physician (Hematology and Oncology) ? 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 ? ?INTERVAL HISTORY: Patient is a 81 year old female with a history of stage IIIb squamous cell carcinoma status post resection on November 13, 2020.  She recently was receiving adjuvant chemotherapy using cisplatin and Taxotere, but has not received treatment since February 07, 2021.  She recently has had multiple admissions initially for pneumonia and possible pulmonary abscess and now for recurrent pneumonia secondary to possible aspiration.  She has increased weakness and fatigue.  She has a persistent cough and shortness of breath.  She has no neurologic complaints.  She denies any fevers.  She has a poor appetite.  She has no chest pain or hemoptysis.  She denies any nausea, vomiting, constipation, or diarrhea.  She has no urinary complaints.  Patient feels generally terrible, but offers no further specific complaints today. ? ?REVIEW OF SYSTEMS:   ?Review of Systems  ?Constitutional:  Positive for malaise/fatigue. Negative for fever and weight loss.  ?Respiratory:  Positive for cough and shortness of breath. Negative for hemoptysis.   ?Cardiovascular: Negative.  Negative for chest pain and leg swelling.  ?Gastrointestinal: Negative.  Negative for abdominal pain and nausea.  ?Genitourinary: Negative.  Negative for dysuria.  ?Musculoskeletal: Negative.  Negative for back pain.  ?Skin: Negative.  Negative for rash.  ?Neurological:  Positive for weakness. Negative for dizziness, focal  weakness and headaches.  ?Psychiatric/Behavioral: Negative.  The patient is not nervous/anxious.   ? ?As per HPI. Otherwise, a complete review of systems is negative. ? ?PAST MEDICAL HISTORY: ?Past Medical History:  ?Diagnosis Date  ? Arthritis   ? Breast cancer [facility]) 2011  ? radiation- Left  ? CHF (congestive heart failure) ([place])   ? Coronary artery disease   ? Dyspnea   ? Hyperlipidemia   ? Hypertension   ? Myocardial infarction [facility])   ? Personal history of radiation therapy   ? PVD (peripheral vascular disease) ([place])   ? ? ?PAST SURGICAL HISTORY: ?Past Surgical History:  ?Procedure Laterality Date  ? ABDOMINAL HYSTERECTOMY    ? APPENDECTOMY    ? BREAST BIOPSY Left 12/21/2009  ? positive, radiation dcis  ? BREAST EXCISIONAL BIOPSY Left 01/17/2010  ? lumpectomy DCIS  ? BREAST LUMPECTOMY    ? CATARACT EXTRACTION W/ INTRAOCULAR LENS IMPLANT & ANTERIOR VITRECTOMY, BILATERAL    ? CHOLECYSTECTOMY    ? COLONOSCOPY WITH PROPOFOL N/A 09/25/2017  ? Procedure: COLONOSCOPY WITH PROPOFOL;  Surgeon: Lollie Sails, MD;  Location[facility];  Service: Endoscopy;  Laterality: N/A;  ? CORONARY ARTERY BYPASS GRAFT N/A 05/03/2020  ? Procedure: CORONARY ARTERY BYPASS GRAFTING (CABG) X FOUR ON PUMP USING LEFT INTERNAL MAMMARY ARTERY AND RIGHT ENDOSCOPIC SAPHEOUS VEIN HARVEST CONDUITS;  Surgeon: Melrose Nakayama, MD;  Location: [place];  Service: Open Heart Surgery;  Laterality: N/A;  ? DILATION AND CURETTAGE OF UTERUS    ? EYE SURGERY    ? bilateral cataracts  ? INTERCOSTAL NERVE BLOCK Left 11/13/2020  ? Procedure: INTERCOSTAL NERVE BLOCK;  Surgeon: Melrose Nakayama, MD;  Location: [place];  Service: Thoracic;  Laterality: Left;  ?  IR IMAGING GUIDED PORT INSERTION  01/12/2021  ? JOINT REPLACEMENT Right 01/24/2016  ? KNEE ARTHROPLASTY Right 01/24/2016  ? Procedure: COMPUTER ASSISTED TOTAL KNEE ARTHROPLASTY;  Surgeon: Dereck Leep, MD;  Location: [facility] ORS;  Service: Orthopedics;  Laterality: Right;  ? LEFT HEART CATH  AND CORONARY ANGIOGRAPHY N/A 05/01/2020  ? Procedure: LEFT HEART CATH AND CORONARY ANGIOGRAPHY;  Surgeon: Wellington Hampshire, MD;  Location: [place] CV LAB;  Service: Cardiovascular;  Laterality: N/A;  ? LUNG LOBECTOMY  10/2020  ? LYMPH NODE DISSECTION Left 11/13/2020  ? Procedure: LYMPH NODE DISSECTION;  Surgeon: Melrose Nakayama, MD;  Location: [facility] OR;  Service: Thoracic;  Laterality: Left;  ? RIGHT/LEFT HEART CATH AND CORONARY/GRAFT ANGIOGRAPHY N/A 07/27/2020  ? Procedure: RIGHT/LEFT HEART CATH AND CORONARY/GRAFT ANGIOGRAPHY;  Surgeon: Wellington Hampshire, MD;  Location: [place] CV LAB;  Service: Cardiovascular;  Laterality: N/A;  ? stent in Lt leg Left   ? TEE WITHOUT CARDIOVERSION N/A 05/03/2020  ? Procedure: TRANSESOPHAGEAL ECHOCARDIOGRAM (TEE);  Surgeon: Melrose Nakayama, MD;  Location: [place];  Service: Open Heart Surgery;  Laterality: N/A;  ? VIDEO BRONCHOSCOPY WITH ENDOBRONCHIAL NAVIGATION N/A 09/25/2020  ? Procedure: VIDEO BRONCHOSCOPY WITH ENDOBRONCHIAL NAVIGATION;  Surgeon: Melrose Nakayama, MD;  Location: [place];  Service: Thoracic;  Laterality: N/A;  ? ? ?FAMILY HISTORY: ?Family History  ?Problem Relation Age of Onset  ? Hypertension Mother   ? Arthritis Mother   ? Gout Mother   ? Lung cancer Father   ? Heart attack Sister   ? ? ?ADVANCED DIRECTIVES (Y/N):  @ADVDIR @ ? ?HEALTH MAINTENANCE: ?Social History  ? ?Tobacco Use  ? Smoking status: Former  ? Smokeless tobacco: Never  ?Vaping Use  ? Vaping Use: Never used  ?Substance Use Topics  ? Alcohol use: No  ?  Alcohol/week: 0.0 standard drinks  ? Drug use: No  ? ? ? Colonoscopy: ? PAP: ? Bone density: ? Lipid panel: ? ?Allergies  ?Allergen Reactions  ? Ibuprofen Hives  ? Aleve [Naproxen Sodium] Hives  ? Avapro  [Irbesartan] Rash  ? Azithromycin Hives  ? Nickel Itching  ?  Redness, itching and fluid discharge with nickel earrings  ? ? ?Current Facility-Administered Medications  ?Medication Dose Route Frequency Provider Last Rate Last  Admin  ? acetaminophen (TYLENOL) tablet 1,000 mg  1,000 mg Oral Q6H PRN Annita Brod, MD      ? albuterol (PROVENTIL) (2.5 MG/3ML) 0.083% nebulizer solution 2.5 mg  2.5 mg Nebulization Q2H PRN Annita Brod, MD      ? Derrill Memo ON 04/05/2021] ampicillin-sulbactam (UNASYN) 1.5 g in sodium chloride 0.9 % 100 mL IVPB  1.5 g Intravenous Q6H Ravishankar, Joellyn Quails, MD      ? aspirin EC tablet 81 mg  81 mg Oral Daily Annita Brod, MD   81 mg at 04/04/21 0931  ? atorvastatin (LIPITOR) tablet 20 mg  20 mg Oral QHS Annita Brod, MD   20 mg at 04/03/21 2107  ? benzonatate (TESSALON) capsule 200 mg  200 mg Oral BID PRN Annita Brod, MD   200 mg at 04/04/21 0426  ? bisacodyl (DULCOLAX) suppository 10 mg  10 mg Rectal Daily PRN Annita Brod, MD      ? Chlorhexidine Gluconate Cloth 2 % PADS 6 each  6 each Topical Q0600 Annita Brod, MD   6 each at 04/04/21 [phone]  ? clopidogrel (PLAVIX) tablet 75 mg  75 mg Oral Daily Annita Brod, MD  75 mg at 04/04/21 0931  ? furosemide (LASIX) injection 40 mg  40 mg Intravenous BID Annita Brod, MD   40 mg at 04/04/21 1612  ? gabapentin (NEURONTIN) capsule 100 mg  100 mg Oral BID Annita Brod, MD   100 mg at 04/04/21 1423  ? Gerhardt's butt cream   Topical TID Annita Brod, MD   Given at 04/04/21 1612  ? magic mouthwash w/lidocaine  5 mL Oral TID PRN Annita Brod, MD   5 mL at 04/04/21 0931  ? magnesium hydroxide (MILK OF MAGNESIA) suspension 30 mL  30 mL Oral Daily PRN Annita Brod, MD      ? midodrine (PROAMATINE) tablet 10 mg  10 mg Oral TID WC Annita Brod, MD   10 mg at 04/04/21 1612  ? [START ON 04/05/2021] potassium chloride SA (KLOR-CON M) CR tablet 20 mEq  20 mEq Oral Daily Annita Brod, MD      ? ?Facility-Administered Medications Ordered in Other Encounters  ?Medication Dose Route Frequency Provider Last Rate Last Admin  ? heparin lock flush 100 UNIT/ML injection           ? sodium chloride flush (NS)  0.9 % injection 10 mL  10 mL Intravenous Once Lloyd Huger, MD      ? ? ?OBJECTIVE: ?Vitals:  ? 04/04/21 1341 04/04/21 1557  ?BP: (!) 128/104 (!) 123/50  ?Pulse: 87 92  ?Resp: 16 16  ?Temp: 97.6 ?F (3

## 2021-04-04 NOTE — Progress Notes (Signed)
Physical Therapy Treatment ?Patient Details ?Name: [name] ?MRN: [id number] ?DOB: 11-Feb-1940 ?Today's Date: 04/04/2021 ? ? ?History of Present Illness 81 yo F presenting to [facility] ED on 04/01/21 with complaints of shortness of breath, weakness and a productive cough. On arrival, the patient was hypotensive and required an increase in supplemental oxygen from chronic 2 L [place] to 4 L [place]. Pt was initially admitted to the [facility] unit with acute on chronic hypoxic respiratory failure secondary to new bronchiectasis/possible aspiration, however, she became hypotensive, hgb 6.8 requiring 1 unit of pRBC's and levophed gtt. Past medical history significant for stage IIIb squamous cell carcinoma of the lung status post lobectomy, CABG ? ?  ?PT Comments  ? ? Pt is making good progress towards goals with ability to ambulate slightly increased distances this date, still needs max encouragement. All mobility performed on RA with sats WNL. Left in bed (per request) and positioned on R side due to skin breakdown. Encouraged to continue mobility efforts with RN staff. Currently doesn't appear to be at baseline level. WIll continue to progress.   ?Recommendations for follow up therapy are one component of a multi-disciplinary discharge planning process, led by the attending physician.  Recommendations may be updated based on patient status, additional functional criteria and insurance authorization. ? ?Follow Up Recommendations ? Skilled nursing-short term rehab (<3 hours/day) ?  ?  ?Assistance Recommended at Discharge Intermittent Supervision/Assistance  ?Patient can return home with the following A little help with walking and/or transfers;A little help with bathing/dressing/bathroom;Assist for transportation;Help with stairs or ramp for entrance ?  ?Equipment Recommendations ? None recommended by PT  ?  ?Recommendations for Other Services   ? ? ?  ?Precautions / Restrictions Precautions ?Precautions: Fall ?Restrictions ?Weight  Bearing Restrictions: No  ?  ? ?Mobility ? Bed Mobility ?Overal bed mobility: Needs Assistance ?Bed Mobility: Supine to Sit ?  ?  ?Supine to sit: Min assist ?Sit to supine: Min assist ?  ?General bed mobility comments: needs assist for B LE management and trunkal elevation. Able to sit at EOB with supervision. All mobiltiy performed on RA ?  ? ?Transfers ?Overall transfer level: Needs assistance ?Equipment used: Rolling [place]ak[place] (2 wheels) ?Transfers: Sit to/from Stand ?Sit to Stand: Min guard ?  ?  ?  ?  ?  ?General transfer comment: cues for hand placement. Once standing, upright posture noted ?  ? ?Ambulation/Gait ?Ambulation/Gait assistance: Min guard ?Gait Distance (Feet): 40 Feet ?Assistive device: Rolling Mccask[place] (2 wheels) ?Gait Pattern/deviations: Step-through pattern ?  ?  ?  ?General Gait Details: ambulated 2 laps in room with quick cadence. Fatigues very quickly and needs to use RW. O2 sats at 94% with exeriton on RA. Pt declined to ambulate further distance due to fatigue and requesting to return to bed ? ? ?Stairs ?  ?  ?  ?  ?  ? ? ?Wheelchair Mobility ?  ? ?Modified Rankin (Stroke Patients Only) ?  ? ? ?  ?Balance Overall balance assessment: Needs assistance ?Sitting-balance support: No upper extremity supported, Feet unsupported ?Sitting balance-Leahy Scale: Fair ?  ?  ?Standing balance support: Bilateral upper extremity supported ?Standing balance-Leahy Scale: Fair ?  ?  ?  ?  ?  ?  ?  ?  ?  ?  ?  ?  ?  ? ?  ?Cognition Arousal/Alertness: Awake/alert ?Behavior During Therapy: Flat affect ?Overall Cognitive Status: Within Functional Limits for tasks assessed ?  ?  ?  ?  ?  ?  ?  ?  ?  ?  ?  ?  ?  ?  ?  ?  ?  General Comments: more alert and oriented this date, still sleepy. ?  ?  ? ?  ?Exercises   ? ?  ?General Comments   ?  ?  ? ?Pertinent Vitals/Pain Pain Assessment ?Pain Assessment: No/denies pain  ? ? ?Home Living   ?  ?  ?  ?  ?  ?  ?  ?  ?  ?   ?  ?Prior Function    ?  ?  ?   ? ?PT Goals (current  goals can now be found in the care plan section) Acute Rehab PT Goals ?Patient Stated Goal: I want to take a nap ?PT Goal Formulation: With patient ?Time For Goal Achievement: 04/17/21 ?Potential to Achieve Goals: Good ?Progress towards PT goals: Progressing toward goals ? ?  ?Frequency ? ? ? Min 2X/week ? ? ? ?  ?PT Plan Current plan remains appropriate  ? ? ?Co-evaluation   ?  ?  ?  ?  ? ?  ?AM-PAC PT "6 Clicks" Mobility   ?Outcome Measure ? Help needed turning from your back to your side while in a flat bed without using bedrails?: A Little ?Help needed moving from lying on your back to sitting on the side of a flat bed without using bedrails?: A Little ?Help needed moving to and from a bed to a chair (including a wheelchair)?: A Little ?Help needed standing up from a chair using your arms (e.g., wheelchair or bedside chair)?: A Little ?Help needed to walk in hospital room?: A Little ?Help needed climbing 3-5 steps with a railing? : A Lot ?6 Click Score: 17 ? ?  ?End of Session Equipment Utilized During Treatment: Gait belt ?Activity Tolerance: Patient limited by fatigue ?Patient left: in bed;with bed alarm set;with family/visitor present ?Nurse Communication: Mobility status ?PT Visit Diagnosis: Muscle weakness (generalized) (M62.81);Unsteadiness on feet (R26.81) ?  ? ? ?Time: 4008-6761 ?PT Time Calculation (min) (ACUTE ONLY): 16 min ? ?Charges:  $Gait Training: 8-22 mins          ?          ? ?Greggory Stallion, PT, DPT, GCS ?[phone] ? ? ? ?Zyrell Carmean ?04/04/2021, 12:00 PM ? ?

## 2021-04-04 NOTE — Assessment & Plan Note (Signed)
Secondary to dehydration.  Resolved with IV fluids. ?

## 2021-04-04 NOTE — Assessment & Plan Note (Addendum)
Patient wishes to continue to be DNR in regards to CPR.Appreciate palliative care and oncology weigh in.  Plan is for patient to go home with hospice services.   ?

## 2021-04-04 NOTE — Hospital Course (Addendum)
81 year old female old female with PMH significant for stage IIIb squamous cell lung cancer status postresection of left lower lobe but unable to tolerate 4 rounds of chemotherapy, chronic systolic heart failure and severe protein calorie malnutrition with discharged on 3/15 after treatment for CAP with suspected lung abscess (not biopsied or aspirated) who presented back to emergency room on 3/19 with sepsis secondary to bronchiectasis and aspiration pneumonia.  Patient remained hypotensive requiring pressor support which was able to be eventually weaned off.  BNP checked on 3/22 found to be elevated at 1300 and patient started on IV Lasix.  After multiple meetings with palliative care and family for goals of care, plan is for patient to go home with palliative care following and are receptive for hospice services.  They will help set up home medical equipment.  Patient ready for discharge on 3/24. ?

## 2021-04-04 NOTE — Assessment & Plan Note (Addendum)
Pressure Injury 03/22/21 Sacrum Mid Stage 2 -  Partial thickness loss of dermis presenting as a shallow open injury with a red, pink wound bed without slough. (Active)  ?03/22/21 1300  ?Location: Sacrum  ?Location Orientation: Mid  ?Staging: Stage 2 -  Partial thickness loss of dermis presenting as a shallow open injury with a red, pink wound bed without slough.  ?Wound Description (Comments):   ?Present on Admission: Yes  ? ?Noted stage II of sacrum. ?

## 2021-04-04 NOTE — Assessment & Plan Note (Deleted)
BNP on admission at 291.  Will recheck BMP again today.  May need to be further diuresed.  Patient is 1.5 L positive since admission ?

## 2021-04-04 NOTE — Assessment & Plan Note (Addendum)
Resolved.  Secondary to pneumonia, likely aspiration.  Met criteria secondary to hypotension requiring pressor support.  We will plan to discharge on doxycycline at previous regimen, continue until 4/14 for pulmonary abscess.  Will discuss with infectious disease.  Patient is going home with hospice services. ?

## 2021-04-04 NOTE — Progress Notes (Addendum)
Speech Language Pathology Treatment: Dysphagia  ?Patient Details ?Name: [name] ?MRN: [id number] ?DOB: [birth year] ?Today's Date: 04/04/2021 ?Time: 8786-7672 ?SLP Time Calculation (min) (ACUTE ONLY): 40 min ? ?Assessment / Plan / Recommendation ?Clinical Impression ? Pt seen for brief f/u today per request of Palliative Care d/t concern for increased coughing reported yesterday. Upon talking w/ Daughter in room and NSG, pt's coughing was ongoing throughout the day when NOT drinking or eating. Pt's Daughter endorsed pt "could cough every once in awhile at home when eating". Reviewed Labs, afebrile and WBC normal. No increased O2 needs. Pt has been using her incentive spirometer/breathing exercises. NSG denied any issues w/ swallowing today thus far w/ po intake. 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 ?Pt was resting in bed; required positioning support d/t weakness. She needed a pillow lower behind back for more forward, upright sitting. Verbal to answer basic questions; strong voice and cough. Needed min cues for follow through w/ tasks. Similar to presentation at [facility].     ?  ?Pt appears to present w/ adequate oropharyngeal phase swallow w/ No immediate, overt oropharyngeal phase dysphagia noted, No neuromuscular deficits noted. Pt consumed po trials of thin liquids Via Cup w/ no immediate, overt clinical s/s of aspiration during po trials.      ?Pt appears at reduced risk for aspiration when following general aspiration precautions and modifying diet slightly to ease mastication and conservation of energy; and when given support at meals for Upright positioning and helping w/ feeding of foods.  She does have increased risk factors including acute illness w/ Pulmonary decline;  recent decreased oral intake and lengthy illness for ~2+ weeks. Per Dietician notes; pt has "Severe Malnutrition related to cancer and cancer related treatments as evidenced by percent weight loss, severe fat depletion, severe muscle depletion". ?  ?During po trials, pt consumed thin liquids using Single, Small sips w/ no immediate, overt coughing, decline in vocal quality, or change in respiratory presentation during/post trials. O2 sats remained ~98%. Oral phase appeared [facility] w/ timely bolus management and control of bolus propulsion for A-P transfer for swallowing w/ liquids; oral clearing achieved w/ all trials. Pt fed self sips of liquids holding Cup and given setup/support.  ? ?Recommend continue a more Mech Soft/Regular(for pleasure and encouragement to eat) consistency diet w/ well-chopped meats, moistened foods; Thin liquids Via Cup - pt to [employer], Small sips Slowly. Recommend general aspiration precautions. Support at meals w/ sitting up and support w/ feeding of po's as needed. Pills WHOLE in Puree for safer, easier swallowing to reduce risk for aspiration. Education given on Pills in Puree; food consistencies and easy to eat options; general aspiration precautions; and need to support pt during acute illness.  ?No further ST services indicated currently. Please reconsult if decline in status while admitted. Recommend continue following w/ Dietician for support. Recommend f/u w/ Palliative Care for [place], support. Recommend f/u w/ Respiratory for incentive spirometer/breathing exercises. NSG updated. Family agreed. ? ? ? ? ?  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 intake and protein calorie malnutrition, MI,  CABG x 4, CAD, PAD, HLD, NSTEMI, HTN, DJD, CHF and breast cancer s/p XRT who was discharged 5 days ago after treatment for CAP is now brought back in by family for shortness of breath.  She had d/c'd home despite recommendations to d/c to SNF for rehab.  Pt now stating they are unable to care for her at home.  Pt is ffollowed by the Dietitian at the [facility]. Pt with poor appetite and oral intake at baseline. Pt was eating ~50% of meals during her last admission. Per chart, pt is down 17lbs(15%) over the past two months; this is severe weight loss. Pt will not drink nutritional supplements.   CT angio of Chest: New tree-in-bud opacities in the right lung typical of  bronchiolitis, likely infectious.  3. Previous left lung consolidation anteriorly is improving,  although slight worsening in the dependent left lung.  4. Thick-walled air-fluid collection in the posterior left  hemithorax is not significantly changed from CT 11 days ago. This  may represent sequela of prior lobectomy or pulmonary abscess.  5. Similar prominent mediastinal and hilar nodes. ?  ?   ?SLP Plan ? All goals met ? ?  ?  ?Recommendations for follow up therapy are one component of a multi-disciplinary discharge planning process, led by the attending physician.  Recommendations may be updated based on patient status, additional functional criteria and insurance authorization. ?  ? ?Recommendations  ?Diet recommendations: Regular;Dysphagia 3 (mechanical soft);Thin liquid (cut meats; moistened foods) ?Liquids provided via: Cup;No straw ?Medication Administration: Whole meds with puree (for safer swallowing) ?Supervision: Patient able to self feed (setup support by staff) ?Compensations: Slow rate;Minimize environmental distractions;Small sips/bites;Lingual sweep for clearance of pocketing;Follow solids with liquid ?Postural Changes and/or Swallow Maneuvers: Out of bed for meals;Seated upright 90 degrees;Upright 30-60 min after meal  ?   ?    ?    ? ? ? ? General recommendations:  (Dietician f/u) ?Oral Care Recommendations: Oral care BID;Oral care before and after PO;Patient independent with oral care (setup support by staff) ?Follow Up Recommendations: No SLP follow up ?Assistance recommended at discharge: Intermittent Supervision/Assistance (d/t generalized weakness, UEs -- baseline) ?SLP Visit Diagnosis: Dysphagia, unspecified (R13.10) ?Plan: All goals met ? ? ? ? ?  ?  ? ? ? ? ? ?Orinda Kenner, MS, CCC-SLP ?Speech Language Pathologist ?Rehab Services; [place] ?[phone] (ascom) ?Fatou Dunnigan ? ?04/04/2021, 4:03 PM ?

## 2021-04-04 NOTE — Assessment & Plan Note (Addendum)
Concerns for recurrent aspiration issue.  Speech therapy recommending dysphagia 3 diet with thin liquids.  Infectious disease following, continue antibiotics.  Sputum cultures with no growth to date.  Procalcitonin level normalizing.  We will plan to continue her previous Ceftin and Flagyl. ?

## 2021-04-04 NOTE — Progress Notes (Signed)
Occupational Therapy Treatment ?Patient Details ?Name: [name] ?MRN: [id number] ?DOB: 10/27/1940 ?Today's Date: 04/04/2021 ? ? ?History of present illness 81 yo F presenting to [facility] ED on 04/01/21 with complaints of shortness of breath, weakness and a productive cough. On arrival, the patient was hypotensive and required an increase in supplemental oxygen from chronic 2 L [place] to 4 L [place]. Pt was initially admitted to the [facility] unit with acute on chronic hypoxic respiratory failure secondary to new bronchiectasis/possible aspiration, however, she became hypotensive, hgb 6.8 requiring 1 unit of pRBC's and levophed gtt. Past medical history significant for stage IIIb squamous cell carcinoma of the lung status post lobectomy, CABG ?  ?OT comments ? Pt seen for OT treatment on this date. Upon arrival to room, pt asleep in bed with granddaughter present. Pt easily awoken and agreeable to OT tx. Pt required MIN GUARD for bed mobility, MIN A for seated UB dressing, SET-Up assist for seated grooming tasks, and MIN A for functional mobility of short household distances with RW d/t decreased balance, strength, and activity tolerance. Pt left sitting upright in recliner, with pillow placed on seat for comfort, in no acute distress. Pt continues to benefit from skilled OT services to maximize return to PLOF and minimize risk of future falls, injury, caregiver burden, and readmission. Will continue to follow POC. Discharge recommendation remains appropriate.    ? ?Recommendations for follow up therapy are one component of a multi-disciplinary discharge planning process, led by the attending physician.  Recommendations may be updated based on patient status, additional functional criteria and insurance authorization. ?   ?Follow Up Recommendations ? Skilled nursing-short term rehab (<3 hours/day)  ?  ?Assistance Recommended at Discharge Frequent or constant Supervision/Assistance  ?Patient can return home with the following ? A  little help with walking and/or transfers;A lot of help with bathing/dressing/bathroom;Assistance with cooking/housework;Assistance with feeding;Direct supervision/assist for medications management;Help with stairs or ramp for entrance ?  ?Equipment Recommendations ? Other (comment) (defer to next venue of care)  ?  ?   ?Precautions / Restrictions Precautions ?Precautions: Fall ?Precaution Comments: monitor BP ?Restrictions ?Weight Bearing Restrictions: No  ? ? ?  ? ?Mobility Bed Mobility ?Overal bed mobility: Needs Assistance ?Bed Mobility: Supine to Sit ?  ?  ?Supine to sit: Min guard, HOB elevated ?  ?  ?General bed mobility comments: Requires increased time/effort only this date ?  ? ?Transfers ?Overall transfer level: Needs assistance ?Equipment used: [place] (2 wheels) ?Transfers: Sit to/from Stand ?Sit to Stand: Min guard ?  ?  ?  ?  ?  ?General transfer comment: cues for hand placement. Once standing, upright posture noted ?  ?  ?Balance Overall balance assessment: Needs assistance ?Sitting-balance support: No upper extremity supported, Feet unsupported ?Sitting balance-Leahy Scale: Fair ?Sitting balance - Comments: Requires SUPERVISION for static sitting balance at EOB ?  ?Standing balance support: Bilateral upper extremity supported, During functional activity ?Standing balance-Leahy Scale: Fair ?Standing balance comment: Able to maintain static standing balance with MIN GUARD ?  ?  ?  ?  ?  ?  ?  ?  ?  ?  ?  ?   ? ?ADL either performed or assessed with clinical judgement  ? ?ADL Overall ADL's : Needs assistance/impaired ?  ?  ?Grooming: Dance movement psychotherapist;Set up;Sitting ?  ?  ?  ?  ?  ?Upper Body Dressing : Minimal assistance;Sitting ?Upper Body Dressing Details (indicate cue type and reason): to don sweater posteriorly. Requires MIN  A to don around back, but pt able to thread UE through sleeves ?  ?  ?  ?  ?  ?  ?  ?  ?Functional mobility during ADLs: Minimal assistance;Rolling [place] (2 wheels) (to  take 20 small shuffled steps forward/backwards) ?  ?  ? ? ? ?Cognition Arousal/Alertness: Awake/alert ?Behavior During Therapy: Flat affect ?Overall Cognitive Status: Within Functional Limits for tasks assessed ?  ?  ?  ?  ?  ?  ?  ?  ?  ?  ?  ?  ?  ?  ?  ?  ?General Comments: more alert and oriented this date, still sleepy. ?  ?  ?   ?   ?   ?   ? ? ?Pertinent Vitals/ Pain       Pain Assessment ?Pain Assessment: No/denies pain ? ?   ?   ? ?Frequency ? Min 2X/week  ? ? ? ? ?  ?Progress Toward Goals ? ?OT Goals(current goals can now be found in the care plan section) ? Progress towards OT goals: Progressing toward goals ? ?Acute Rehab OT Goals ?Patient Stated Goal: to get stronger before going home ?OT Goal Formulation: With patient/family ?Time For Goal Achievement: 04/17/21 ?Potential to Achieve Goals: Good  ?Plan Discharge plan remains appropriate;Frequency remains appropriate   ? ?   ?AM-PAC OT "6 Clicks" Daily Activity     ?Outcome Measure ? ? Help from another person eating meals?: A Little ?Help from another person taking care of personal grooming?: A Little ?Help from another person toileting, which includes using toliet, bedpan, or urinal?: A Little ?Help from another person bathing (including washing, rinsing, drying)?: A Lot ?Help from another person to put on and taking off regular upper body clothing?: A Little ?Help from another person to put on and taking off regular lower body clothing?: A Lot ?6 Click Score: 16 ? ?  ?End of Session Equipment Utilized During Treatment: Gait belt ? ?OT Visit Diagnosis: Unsteadiness on feet (R26.81);Muscle weakness (generalized) (M62.81) ?  ?Activity Tolerance Patient tolerated treatment well ?  ?Patient Left in chair;with call bell/phone within reach;with chair alarm set;with family/visitor present ?  ?Nurse Communication Mobility status ?  ? ?   ? ?Time: 3013-1438 ?OT Time Calculation (min): 30 min ? ?Charges: OT General Charges ?$OT Visit: 1 Visit ?OT  Treatments ?$Self Care/Home Management : 8-22 mins ?$Therapeutic Activity: 8-22 mins ? ?Fredirick Maudlin, OTR/L ?[place]  ?

## 2021-04-04 NOTE — Progress Notes (Signed)
[facility] Progress Note ? ?Patient: [name]    WJX:[id number]  DOA: 04/01/2021    ?Date of Service: the patient was seen and examined on 04/04/2021 ? 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  Patient remained hypotensive requiring pressor support which was able to be eventually weaned off.   ? ? ? ?Assessment and Plan: ?Assessment and Plan: ?* Septic shock (HCC)-resolved as of 04/04/2021 ?Resolved.  Secondary to pneumonia, likely aspiration.  Met criteria secondary to hypotension requiring pressor support.  Continue antibiotics. ? ?Community acquired pneumonia complicated by underlying lung cancer, apparent lung abscess on CT Chest  ?Concerns for recurrent aspiration issue.  Speech therapy to see.  Infectious disease following, continue antibiotics.  Awaiting sputum cultures.  Procalcitonin level continues to trend downward.  Speech therapy checking swallow evaluation. ? ?Acute on chronic systolic CHF (congestive heart failure) ([place]) ?BNP on admission at 291.  BNP at 1500 today.  Worsening secondary to aggressive fluid resuscitation and came in with septic shock.  We will start IV Lasix.  Echocardiogram in December 2022 notes ejection fraction of 20 to 25%. ? ?Acute on chronic respiratory failure ([place]) ?Was discharged on 2 L nasal cannula at last admission.  Due to pneumonia, required increased oxygen support and now back down to 2 L. ? 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  Speech therapy to see.  Oncology to see in conjunction with palliative care for goals of care. ? ?Pressure injury of skin ?Pressure Injury 03/22/21 Sacrum Mid Stage 2 -  Partial thickness loss of dermis presenting as a shallow open injury with a red, pink wound bed without slough. (Active)  ?03/22/21 1300  ?Location: Sacrum  ?Location Orientation: Mid  ?Staging: Stage 2 -  Partial thickness loss of dermis presenting as a shallow open injury with a red, pink wound bed without slough.  ?Wound Description (Comments):   ?Present on Admission: Yes  ? ? ? ?Hyponatremia ?Secondary to dehydration.  Resolved with IV fluids. ? ? ? ? ? ? ?Body mass index is 18.34 kg/m?Marland Kitchen  ?Nutrition Problem: Severe Malnutrition ?Etiology: cancer and cancer related treatments ?Pressure Injury 03/22/21 Sacrum Mid Stage 2 -  Partial thickness loss of dermis presenting as a shallow open injury with a red, pink wound bed without slough. (Active)  ?03/22/21 1300  ?Location: Sacrum  ?Location Orientation: Mid  ?Staging: Stage 2 -  Partial thickness loss of dermis presenting as a shallow open injury with a red, pink wound bed without slough.  ?Wound Description (Comments):   ?Present on Admission: Yes  ?Dressing Type Foam - Lift dressing to assess site every shift;Other (Comment) 04/04/21 0730  ?  ? ?Consultants: ?Critical care ?Infectious disease ?Palliative care ? ?Procedures: ?Pressor support ? ?Antimicrobials: ?IV Zosyn 3/19-present ?IV doxycycline 3/19-present ?Prior to hospitalization had been on oral cefuroxime and Flagyl ? ?Code Status: DNR ? ? ?Subjective: Patient states that she is feeling fine ? ?Objective: ?Vital signs stable ?Vitals:  ? 04/04/21 1000 04/04/21 1341  ?BP: Marland Kitchen)  115/46 (!) 128/104  ?Pulse: 90 87  ?Resp: (!) 24 16  ?Temp:  97.6 ?F (36.4 ?C)  ?SpO2: 96% 96%  ? ? ?Intake/Output Summary (Last 24 hours) at 04/04/2021 1347 ?Last data filed at 04/04/2021 1000 ?Gross per 24 hour  ?Intake 418.04 ml  ?Output 575 ml   ?Net -156.96 ml  ? ?Filed Weights  ? 04/01/21 1349 04/02/21 0828  ?Weight: 43.1 kg 42.6 kg  ? ?Body mass index is 18.34 kg/m?. ? ?Exam: ? ?General: Alert and oriented x3, no acute distress ?HEENT: Normocephalic, atraumatic, mucous membranes are slightly dry ?Cardiovascular: Regular rate and rhythm, S1-S2 ?Respiratory: Decreased breath sounds throughout ?Abdomen: Soft, nontender, nondistended, positive bowel sounds ?Musculoskeletal: No clubbing or cyanosis or edema ?Skin: No skin breaks, tears or lesions ?Psychiatry: Appropriate, no evidence of psychoses ?Neurology: No focal deficits ? ?Data Reviewed: ?Elevated BNP at 1375.  Improving procalcitonin at 0.11, down from 0.23 hemoglobin down to 7.7 from 8.4 ? ?Disposition:  ?Status is: Inpatient ?Remains inpatient appropriate because: Further evaluation for disposition and long-term planning.  Needs further evaluation with speech therapy.  Need to treat acute heart failure. ?  ? ?Family Communication: Daughter at the bedside ?DVT Prophylaxis: ?Place and maintain sequential compression device Start: 04/04/21 1124 ? ? ? ?Author: ?Annita Brod ,MD ?04/04/2021 1:47 PM ? ?To reach On-call, see care teams to locate the attending and reach out via [email or web address]. ?Between 7PM-7AM, please contact night-coverage ?If you still have difficulty reaching the attending provider, please page the [facility] (Director on Call) for [facility] on amion for assistance. ? ?

## 2021-04-04 NOTE — TOC Progression Note (Signed)
Transition of Care (TOC) - Progression Note  ? ? ?Patient Details  ?Name: [name] ?MRN: [id number] ?Date of Birth: September 15, 1940 ? ?Transition of Care (TOC) CM/SW Contact  ?Shelbie Hutching, RN ?Phone Number: ?04/04/2021, 11:28 AM ? ?Clinical Narrative:    ?Per nursing report patient up and walking around the unit this morning.  Patient will transferred out of the ICU today.  Palliative care is following because patient is not eating.   ?Palliative will follow up with patient and family tomorrow.  Family reports that they cannot care for her at home and they need placement. ?Only bed offer so far is [facility].  Patient is alert and oriented and able to make her own decisions, she may not agree to SNF.   ? ? ?Expected Discharge Plan: [place] ?Barriers to Discharge: No Barriers Identified ? ?Expected Discharge Plan and Services ?Expected Discharge Plan: [place] ?  ?  ?  ?Living arrangements for the past 2 months: [place] ?                ?  ?  ?  ?  ?  ?  ?  ?  ?  ?  ? ? ?Social Determinants of Health (SDOH) Interventions ?  ? ?Readmission Risk Interventions ? ?  03/26/2021  ?  2:25 PM 05/09/2020  ?  4:56 PM  ?Readmission Risk Prevention Plan  ?Post Dischage Appt  Complete  ?Medication Screening  Complete  ?Transportation Screening Complete Complete  ?Medication Review Press photographer) Complete   ?PCP or Specialist appointment within 3-5 days of discharge Complete   ?SW Recovery Care/Counseling Consult Complete   ?Palliative Care Screening Not Applicable   ?[place] Not Applicable   ? ? ?

## 2021-04-04 NOTE — Assessment & Plan Note (Signed)
Was discharged on 2 L nasal cannula at last admission.  Due to pneumonia, required increased oxygen support and now back down to 2 L. ?

## 2021-04-05 DIAGNOSIS — J9621 Acute and chronic respiratory failure with hypoxia: Secondary | ICD-10-CM | POA: Diagnosis not present

## 2021-04-05 DIAGNOSIS — Z7189 Other specified counseling: Secondary | ICD-10-CM | POA: Diagnosis not present

## 2021-04-05 DIAGNOSIS — I5023 Acute on chronic systolic (congestive) heart failure: Secondary | ICD-10-CM | POA: Diagnosis not present

## 2021-04-05 DIAGNOSIS — C3432 Malignant neoplasm of lower lobe, left bronchus or lung: Secondary | ICD-10-CM | POA: Diagnosis not present

## 2021-04-05 DIAGNOSIS — A419 Sepsis, unspecified organism: Secondary | ICD-10-CM | POA: Diagnosis not present

## 2021-04-05 DIAGNOSIS — R6521 Severe sepsis with septic shock: Secondary | ICD-10-CM | POA: Diagnosis not present

## 2021-04-05 LAB — BASIC METABOLIC PANEL
Anion gap: 8 (ref 5–15)
BUN: 12 mg/dL (ref 8–23)
CO2: 23 mmol/L (ref 22–32)
Calcium: 7.7 mg/dL — ABNORMAL LOW (ref 8.9–10.3)
Chloride: 105 mmol/L (ref 98–111)
Creatinine, Ser: 0.93 mg/dL (ref 0.44–1.00)
GFR, Estimated: >60 mL/min (ref >60)
Glucose, Bld: 83 mg/dL (ref 70–99)
Potassium: 2.9 mmol/L — ABNORMAL LOW (ref 3.5–5.1)
Sodium: 136 mmol/L (ref 135–145)

## 2021-04-05 LAB — PHOSPHORUS: Phosphorus: 3.7 mg/dL (ref 2.5–4.6)

## 2021-04-05 LAB — CBC
HCT: 26.3 % — ABNORMAL LOW (ref 36.0–46.0)
Hemoglobin: 8.7 g/dL — ABNORMAL LOW (ref 12.0–15.0)
MCH: 31 pg (ref 26.0–34.0)
MCHC: 33.1 g/dL (ref 30.0–36.0)
MCV: 93.6 fL (ref 80.0–100.0)
Platelets: 153 10*3/uL (ref 150–400)
RBC: 2.81 MIL/uL — ABNORMAL LOW (ref 3.87–5.11)
RDW: 16.7 % — ABNORMAL HIGH (ref 11.5–15.5)
WBC: 5.1 10*3/uL (ref 4.0–10.5)
nRBC: 0 % (ref 0.0–0.2)

## 2021-04-05 LAB — PROCALCITONIN: Procalcitonin: <0.1 ng/mL

## 2021-04-05 LAB — MAGNESIUM: Magnesium: 1.5 mg/dL — ABNORMAL LOW (ref 1.7–2.4)

## 2021-04-05 MED ORDER — POTASSIUM CHLORIDE CRYS ER 20 MEQ PO TBCR
40.0000 meq | EXTENDED_RELEASE_TABLET | Freq: Once | ORAL | Status: AC
  Administered 2021-04-05: 40 meq via ORAL
  Filled 2021-04-05: qty 2

## 2021-04-05 MED ORDER — POTASSIUM CHLORIDE CRYS ER 20 MEQ PO TBCR
40.0000 meq | EXTENDED_RELEASE_TABLET | Freq: Every day | ORAL | Status: DC
  Administered 2021-04-05 – 2021-04-06 (×2): 40 meq via ORAL
  Filled 2021-04-05 (×2): qty 2

## 2021-04-05 NOTE — Progress Notes (Addendum)
[facility] Liaison Note ? ?Notified by [facility] of patient/family request of [facility] hospice services. However, per family request, they prefer PMT. Referral has been made to [facility] PMT team. ? ?[facility] hospital liaison will follow patient for discharge disposition.  ? ?Addendum: 4:45p ? ?MSW notified by Henrene Hawking, for hospice services at home after discharge vs palliative as family misunderstood plan for dispo. Chart and patient information under review by [facility] physician. Hospice eligibility pending. ?  ?Spoke with daughter/[phone] to initiate education related to hospice philosophy, services, and team approach to care. Helene Kelp verbalized understanding of information given. Per discussion, the plan is for patient to discharge home via [facility] once cleared to DC.  ?  ?DME needs discussed. Patient has the following equipment in the home: ?Oxygen  ?[place] ?BSC ?Cane ?Shower bench ?Bedside Table ?Patient requests the following equipment for delivery: ?hospital bed ?chux ?Address verified and is correct in the chart. Helene Kelp is the family member to contact to arrange time of equipment delivery.  ?  ?Please send signed and completed DNR home with patient/family. Please provide prescriptions at discharge as needed to ensure ongoing symptom management.  ?  ?[facility] information and contact numbers given to family & above information shared with TOC. ?  ?Please call with any questions/concerns.  ?  ?Thank you for the opportunity to participate in this patient's care. ?  ?Daphene Calamity, MSW ?[place]  ?[phone] ? ?

## 2021-04-05 NOTE — Progress Notes (Signed)
PT Cancellation Note ? ?Patient Details ?Name: [name] ?MRN: [id number] ?DOB: 1940/08/27 ? ? ?Cancelled Treatment:    Reason Eval/Treat Not Completed: Other (comment). Per MD at rounds and pallative note, pt is going to discharge home with hospice. PT to SIGN OFF at this time.  ? ? ?Faye Strohman ?04/05/2021, 1:51 PM ?Greggory Stallion, PT, DPT, GCS ?[phone] ? ?

## 2021-04-05 NOTE — Progress Notes (Signed)
OT Cancellation Note ? ?Patient Details ?Name: [name] ?MRN: [id number] ?DOB: Apr 24, 1940 ? ? ?Cancelled Treatment:    Reason Eval/Treat Not Completed: Other (comment). Per MD at rounds and pallative note, pt is going to discharge home with hospice. OT to SIGN OFF at this time.  ? ?Darleen Crocker, MS, OTR/L , CBIS ?ascom [phone]  ?04/05/21, 1:36 PM  ?

## 2021-04-05 NOTE — TOC Progression Note (Signed)
Transition of Care (TOC) - Progression Note  ? ? ?Patient Details  ?Name: MILDA LINDVALL ?MRN: [id number] ?Date of Birth: 10-31-1940 ? ?Transition of Care (TOC) CM/SW Contact  ?Pete Pelt, RN ?Phone Number: ?04/05/2021, 12:22 PM ? ?Clinical Narrative:   Lorayne Bender from [facility] will speak to patient and family, as family as decided to bring patient home with hospice. ? ? ? ?Expected Discharge Plan: [place] ?Barriers to Discharge: No Barriers Identified ? ?Expected Discharge Plan and Services ?Expected Discharge Plan: [place] ?  ?  ?  ?Living arrangements for the past 2 months: [place] ?                ?  ?  ?  ?  ?  ?  ?  ?  ?  ?  ? ? ?Social Determinants of Health (SDOH) Interventions ?  ? ?Readmission Risk Interventions ? ?  03/26/2021  ?  2:25 PM 05/09/2020  ?  4:56 PM  ?Readmission Risk Prevention Plan  ?Post Dischage Appt  Complete  ?Medication Screening  Complete  ?Transportation Screening Complete Complete  ?Medication Review Press photographer) Complete   ?PCP or Specialist appointment within 3-5 days of discharge Complete   ?SW Recovery Care/Counseling Consult Complete   ?Palliative Care Screening Not Applicable   ?[place] Not Applicable   ? ? ?

## 2021-04-05 NOTE — Care Management Important Message (Signed)
Important Message ? ?Patient Details  ?Name: TATAYANA BESHEARS ?MRN: [id number] ?Date of Birth: 1940-07-20 ? ? ?Medicare Important Message Given:  Other (see comment) ? ?Patient and family have decided on Hospice at home. Out of respect for the patient and family no Important Message from [facility] given. ? ? ?Juliann Pulse A Kalev Temme ?04/05/2021, 2:43 PM ?

## 2021-04-05 NOTE — TOC Progression Note (Signed)
Transition of Care (TOC) - Progression Note  ? ? ?Patient Details  ?Name: [name] ?MRN: [id number] ?Date of Birth: 12/05/1940 ? ?Transition of Care (TOC) CM/SW Contact  ?Pete Pelt, RN ?Phone Number: ?04/05/2021, 4:46 PM ? ?Clinical Narrative:   RNCM spoke to granddaughter, Doristine Bosworth and daughter in a group conference.  The difference between Palliative Care and hospice were explained to the family.  Daughter states that the family received a lot of information an needed some time to sort things out. They would like to speak with [facility] and most likely take patient home with hospice.  Michae Kava of [facility] notified and she will speak to the family about services and follow up with TOC.  MD and care team notified of recent decision.  TOC contact information provided.  TOC to follow. ? ? ? ?Expected Discharge Plan: [place] ?Barriers to Discharge: No Barriers Identified ? ?Expected Discharge Plan and Services ?Expected Discharge Plan: [place] ?  ?  ?  ?Living arrangements for the past 2 months: [place] ?                ?  ?  ?  ?  ?  ?  ?  ?  ?  ?  ? ? ?Social Determinants of Health (SDOH) Interventions ?  ? ?Readmission Risk Interventions ? ?  03/26/2021  ?  2:25 PM 05/09/2020  ?  4:56 PM  ?Readmission Risk Prevention Plan  ?Post Dischage Appt  Complete  ?Medication Screening  Complete  ?Transportation Screening Complete Complete  ?Medication Review Press photographer) Complete   ?PCP or Specialist appointment within 3-5 days of discharge Complete   ?SW Recovery Care/Counseling Consult Complete   ?Palliative Care Screening Not Applicable   ?[place] Not Applicable   ? ? ?

## 2021-04-05 NOTE — Progress Notes (Addendum)
? ?                                                                                                                                                     ?                                                   ?Daily Progress Note  ? ?Patient Name: [name]       Date: 04/05/2021 ?DOB: Mar 10, 1940  Age: 81 y.o. MRN#: [id number] ?Attending Physician: Annita Brod, MD ?Primary Care Physician: Derinda Late, MD ?Admit Date: 04/01/2021 ? ?Reason for Consultation/Follow-up: Establishing goals of care ? ?Subjective: ?Notes reviewed. Met with patient, son, and daughter. Patient has spoken with Oncology and for now will have 3-6 month surveillance scans. Family is discussing PEG placement. Patient is clear her current QOL is not acceptable and would need to improve. She states she has tried Prednisone and an additional steroid to help with oral intake and energy. She states it did not make a difference. Daughter discusses that patient loves to sit in her recliner and does not want visitors. She states her mother does not want to come to the hospital. She tells me she really wants to go home. Discussed multiple scenarios. Discussed continued aggressive care, and treating the treatable, as well as comfort focused care at home. Patient is unsure what she wants to do moving forward; patient and family state it is a lot to think about.   ? ?Length of Stay: 4 ? ?Current Medications: ?Scheduled Meds:  ? aspirin EC  81 mg Oral Daily  ? atorvastatin  20 mg Oral QHS  ? Chlorhexidine Gluconate Cloth  6 each Topical Q0600  ? clopidogrel  75 mg Oral Daily  ? furosemide  40 mg Intravenous BID  ? gabapentin  100 mg Oral BID  ? Gerhardt's butt cream   Topical TID  ? midodrine  10 mg Oral TID WC  ? potassium chloride  40 mEq Oral Daily  ? ? ?Continuous Infusions: ? ampicillin-sulbactam (UNASYN) IV 1.5 g (04/05/21 1206)  ? ? ?PRN Meds: ?acetaminophen, albuterol, benzonatate, bisacodyl, magic mouthwash w/lidocaine, magnesium  hydroxide ? ?Physical Exam ?Pulmonary:  ?   Effort: Pulmonary effort is normal.  ?Neurological:  ?   Mental Status: She is alert.  ?         ? ?Vital Signs: BP (!) 120/56 (BP Location: Left Arm)   Pulse 96   Temp 97.9 ?F (36.6 ?C)   Resp 20   Ht 5' (1.524 m)   Wt 42.6 kg   SpO2 94%   BMI 18.34 kg/m?  ?SpO2: SpO2: 94 % ?O2 Device: O2 Device:  Room Air ?O2 Flow Rate: O2 Flow Rate (L/min): 0 L/min ? ?Intake/output summary:  ?Intake/Output Summary (Last 24 hours) at 04/05/2021 1220 ?Last data filed at 04/05/2021 2924 ?Gross per 24 hour  ?Intake 200 ml  ?Output 600 ml  ?Net -400 ml  ? ?LBM: Last BM Date : 04/05/21 ?Baseline Weight: Weight: 43.1 kg ?Most recent weight: Weight: 42.6 kg ? ?     ? ? ? ?Patient Active Problem List  ? Diagnosis Date Noted  ? Acute on chronic respiratory failure ([place]) 04/01/2021  ? Cavitary pneumonia   ? Hypomagnesemia 03/24/2021  ? Protein-calorie malnutrition, severe 03/23/2021  ? Pressure injury of skin 03/22/2021  ? Acute anemia 03/22/2021  ? Community acquired pneumonia complicated by underlying lung cancer, apparent lung abscess on CT Chest  03/21/2021  ? Chronic systolic CHF (congestive heart failure) ([place]) 03/21/2021  ? Sinus tachycardia 03/21/2021  ? Hyponatremia 03/21/2021  ? 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 S/P partial lobectomy of lung 11/13/2020  ? Unstable angina (HCC)   ? COVID-19 virus infection   ? Acute on chronic systolic CHF (congestive heart failure) ([place]) 07/24/2020  ? Coronary artery disease s/p CABG 07/24/2020  ? Acute respiratory failure with hypoxia ([place]) 07/24/2020  ? Acute exacerbation of CHF (congestive heart failure) ([place]) 07/24/2020  ? 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 05/30/2020  ? 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  ? NSTEMI (non-ST elevated myocardial infarction) ([place]) 04/30/2020  ? Elevated troponin 04/30/2020  ? History of left breast cancer 11/25/2019  ? Hyperlipidemia 11/25/2019  ? Carotid stenosis 08/24/2017  ?  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  ? Pain 01/22/2016  ? 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 PAD (peripheral artery disease) ([place]) 11/22/2014  ? H/O malignant neoplasm of breast 11/22/2014  ? HLD (hyperlipidemia) 11/22/2014  ? Essential hypertension 11/22/2014  ? 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 ? ?Palliative Care Assessment & Plan  ? ? ?Recommendations/Plan: ?Family and patient are unsure of what they want to do moving forward. Discussed possiblity of placing feeding tube outpatient to give her a bit of time at home to make the decision.  ? ?Discussed recommending palliative to follow outpatient, with transition to hospice care when they are ready should they choose the aggressive route. Discussed initiating hospice care if she does not.  ? ? ?Code Status: ? ?  ?Code Status Orders  ?(From admission, onward)  ?  ? ? ?  ? ?  Start     Ordered  ? 04/01/21 1754  Do not attempt resuscitation (DNR)  Continuous       ?Question Answer Comment  ?In the event of cardiac or respiratory ARREST Do not call a ?code blue?   ?In the event of cardiac or respiratory ARREST Do not perform Intubation, CPR, defibrillation or ACLS   ?In the event of cardiac or respiratory ARREST Use medication by any route, position, wound care, and other measures to relive pain and suffering. May use oxygen, suction and manual treatment of airway obstruction as needed for comfort.   ?  ? 04/01/21 1759  ? ?  ?  ? ?  ? ?Code Status History   ? ? Date Active Date Inactive Code Status Order ID Comments User Context  ? 03/21/2021 1448 03/28/2021 2003 Full Code [id number]  Emeterio Reeve, DO ED  ? 11/13/2020 1914 11/22/2020 1538 Full Code [id number]  Antony Odea, PA-C Inpatient  ? 07/24/2020  2333 07/28/2020 2133 Full Code [id number]  Erlene Quan, NP ED  ? 05/01/2020 1932 05/09/2020 2313 Full Code [id number]  Leanor Kail, PA Inpatient  ? 05/01/2020 1128 05/01/2020 1614 Full Code [id number]  Rise Mu,  PA-C Inpatient  ? 04/30/2020 1419 05/01/2020 1128 DNR [id number]  Criss Alvine, DO ED  ? 01/24/2016 1905 01/26/2016 1442 Full Code [id number]  Dereck Leep, MD Inpatient  ? ?  ? ?Advance Directive Documentation   ? ?Flowsheet Row Most Recent Value  ?Type of Advance Directive Healthcare Power of Attorney  ?Pre-existing out of facility DNR order (yellow form or pink MOST form) --  ?"MOST" Form in Place? --  ? ?  ? ? ?Prognosis: ? < 6 months ? ? ? ? ?Asencion Gowda, NP ? ?Please contact Palliative Medicine Team phone at [phone] for questions and concerns.  ? ? ? ? ? ?

## 2021-04-05 NOTE — Progress Notes (Signed)
[facility] Progress Note ? ?Patient: [name]    KYH:[id number]  DOA: 04/01/2021    ?Date of Service: the patient was seen and examined on 04/05/2021 ? 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  Patient remained hypotensive requiring pressor support which was able to be eventually weaned off.  BNP checked on 3/22 found to be elevated at 1300 and patient started on Lasix.  After multiple meetings with palliative care and family for goals of care, plan is for patient to go home with palliative care following.  They will help set up home medical equipment. ? ? ? ?Assessment and Plan: ?Assessment and Plan: ?* Septic shock (HCC)-resolved as of 04/04/2021 ?Resolved.  Secondary to pneumonia, likely aspiration.  Met criteria secondary to hypotension requiring pressor support.  Continue antibiotics. ? ?Community acquired pneumonia complicated by underlying lung cancer, apparent lung abscess on CT Chest  ?Concerns for recurrent aspiration issue.  Speech therapy to see.  Infectious disease following, continue antibiotics.  Awaiting sputum cultures.  Procalcitonin level normalizing.  Speech therapy checking swallow evaluation. ? ?Acute on chronic systolic CHF (congestive heart failure) ([place]) ?BNP on admission at 291.  BNP at 1500 today.  Worsening secondary to aggressive fluid resuscitation and came in with septic shock.  We will start IV Lasix.  Echocardiogram in December 2022 notes ejection fraction of 20 to 25%.  She has since diuresed almost 2 L. ? ?Acute on chronic  respiratory failure ([place]) ?Was discharged on 2 L nasal cannula at last admission.  Due to pneumonia, required increased oxygen support and now back down to 2 L. ? 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  Appreciate palliative care and oncology weigh in.  Plan is for patient to go home with palliative care following and at that time she can consider her additional options. ? ?Pressure injury of skin ?Pressure Injury 03/22/21 Sacrum Mid Stage 2 -  Partial thickness loss of dermis presenting as a shallow open injury with a red, pink wound bed without slough. (Active)  ?03/22/21 1300  ?Location: Sacrum  ?Location Orientation: Mid  ?Staging: Stage 2 -  Partial thickness loss of dermis presenting as a shallow open injury with a red, pink wound bed without slough.  ?Wound Description (Comments):   ?Present on Admission: Yes  ? ? ? ?Hyponatremia ?Secondary to dehydration.  Resolved with IV fluids. ? ? ? ? ? ? ?Body mass index is 18.34 kg/m?Marland Kitchen  ?Nutrition Problem: Severe Malnutrition ?Etiology: cancer and cancer related treatments ?Pressure Injury 03/22/21 Sacrum Mid Stage 2 -  Partial thickness loss of dermis presenting as a shallow open injury with a red, pink wound bed without slough. (Active)  ?03/22/21 1300  ?Location: Sacrum  ?Location Orientation: Mid  ?Staging: Stage 2 -  Partial thickness loss of dermis presenting as a shallow open injury with a red, pink wound bed without slough.  ?Wound Description (Comments):   ?Present on Admission: Yes  ?Dressing Type Foam - Lift dressing to assess site every shift 04/05/21  0800  ?  ? ?Consultants: ?Critical care ?Infectious disease ?Palliative care ?Oncology ? ?Procedures: ?Pressor support ? ?Antimicrobials: ?IV Zosyn 3/19-present ?IV doxycycline 3/19-present ?Prior to hospitalization had been on oral cefuroxime and Flagyl ? ?Code Status: DNR ? ? ?Subjective: Patient  states that she is feeling okay, little tired ? ?Objective: ?Vital signs stable ?Vitals:  ? 04/05/21 0019 04/05/21 0512  ?BP: (!) 100/42 (!) 120/56  ?Pulse: 90 96  ?Resp: 16 20  ?Temp: 97.8 ?F (36.6 ?C) 97.9 ?F (36.6 ?C)  ?SpO2: 94% 94%  ? ? ?Intake/Output Summary (Last 24 hours) at 04/05/2021 1455 ?Last data filed at 04/05/2021 9379 ?Gross per 24 hour  ?Intake 200 ml  ?Output 600 ml  ?Net -400 ml  ? ? ?Filed Weights  ? 04/01/21 1349 04/02/21 0828  ?Weight: 43.1 kg 42.6 kg  ? ?Body mass index is 18.34 kg/m?. ? ?Exam: ? ?General: Alert and oriented x3, no acute distress ?HEENT: Normocephalic, atraumatic, mucous membranes are slightly dry ?Cardiovascular: Regular rate and rhythm, S1-S2 ?Respiratory: Decreased breath sounds throughout ?Abdomen: Soft, nontender, nondistended, positive bowel sounds ?Musculoskeletal: No clubbing or cyanosis or edema ?Skin: No skin breaks, tears or lesions ?Psychiatry: Appropriate, no evidence of psychoses ?Neurology: No focal deficits ? ?Data Reviewed: ?Hemoglobin improved to 8.7.  Potassium down to 2.9. ? ?Disposition:  ?Status is: Inpatient ?Remains inpatient appropriate because: Setting up with home equipment.  Patient will go home with palliative care on 3/24. ? ?Family Communication: Daughter at the bedside ?DVT Prophylaxis: ?Place and maintain sequential compression device Start: 04/04/21 1124 ? ? ? ?Author: ?Annita Brod ,MD ?04/05/2021 2:55 PM ? ?To reach On-call, see care teams to locate the attending and reach out via [email or web address]. ?Between 7PM-7AM, please contact night-coverage ?If you still have difficulty reaching the attending provider, please page the [facility] (Director on Call) for [facility] on amion for assistance. ? ?

## 2021-04-06 DIAGNOSIS — J120 Adenoviral pneumonia: Secondary | ICD-10-CM | POA: Diagnosis not present

## 2021-04-06 DIAGNOSIS — R1310 Dysphagia, unspecified: Secondary | ICD-10-CM

## 2021-04-06 DIAGNOSIS — J852 Abscess of lung without pneumonia: Secondary | ICD-10-CM

## 2021-04-06 DIAGNOSIS — J9621 Acute and chronic respiratory failure with hypoxia: Secondary | ICD-10-CM | POA: Diagnosis not present

## 2021-04-06 DIAGNOSIS — A419 Sepsis, unspecified organism: Secondary | ICD-10-CM | POA: Diagnosis not present

## 2021-04-06 DIAGNOSIS — I5023 Acute on chronic systolic (congestive) heart failure: Secondary | ICD-10-CM | POA: Diagnosis not present

## 2021-04-06 LAB — BASIC METABOLIC PANEL
Anion gap: 7 (ref 5–15)
BUN: 13 mg/dL (ref 8–23)
CO2: 25 mmol/L (ref 22–32)
Calcium: 7.6 mg/dL — ABNORMAL LOW (ref 8.9–10.3)
Chloride: 104 mmol/L (ref 98–111)
Creatinine, Ser: 0.84 mg/dL (ref 0.44–1.00)
GFR, Estimated: >60 mL/min (ref >60)
Glucose, Bld: 106 mg/dL — ABNORMAL HIGH (ref 70–99)
Potassium: 3.2 mmol/L — ABNORMAL LOW (ref 3.5–5.1)
Sodium: 136 mmol/L (ref 135–145)

## 2021-04-06 LAB — PHOSPHORUS: Phosphorus: 3 mg/dL (ref 2.5–4.6)

## 2021-04-06 LAB — CBC
HCT: 27.7 % — ABNORMAL LOW (ref 36.0–46.0)
Hemoglobin: 9 g/dL — ABNORMAL LOW (ref 12.0–15.0)
MCH: 30.4 pg (ref 26.0–34.0)
MCHC: 32.5 g/dL (ref 30.0–36.0)
MCV: 93.6 fL (ref 80.0–100.0)
Platelets: 158 10*3/uL (ref 150–400)
RBC: 2.96 MIL/uL — ABNORMAL LOW (ref 3.87–5.11)
RDW: 16.8 % — ABNORMAL HIGH (ref 11.5–15.5)
WBC: 5.7 10*3/uL (ref 4.0–10.5)
nRBC: 0 % (ref 0.0–0.2)

## 2021-04-06 LAB — CULTURE, BLOOD (ROUTINE X 2)
Culture: NO GROWTH
Culture: NO GROWTH

## 2021-04-06 LAB — MAGNESIUM: Magnesium: 1.1 mg/dL — ABNORMAL LOW (ref 1.7–2.4)

## 2021-04-06 MED ORDER — MIDODRINE HCL 10 MG PO TABS: 10.0000 mg | ORAL_TABLET | Freq: Three times a day (TID) | ORAL | 1 refills | Status: AC

## 2021-04-06 MED ORDER — GERHARDT'S BUTT CREAM: 1.0000 "application " | TOPICAL_CREAM | Freq: Three times a day (TID) | CUTANEOUS | 1 refills | Status: AC

## 2021-04-06 MED ORDER — GABAPENTIN 100 MG PO CAPS: 100.0000 mg | ORAL_CAPSULE | Freq: Two times a day (BID) | ORAL | Status: AC

## 2021-04-06 MED ORDER — HEPARIN SOD (PORK) LOCK FLUSH 100 UNIT/ML IV SOLN
500.0000 [IU] | Freq: Once | INTRAVENOUS | Status: AC
  Administered 2021-04-06: 500 [IU] via INTRAVENOUS
  Filled 2021-04-06: qty 5

## 2021-04-06 NOTE — TOC Progression Note (Signed)
Transition of Care (TOC) - Progression Note  ? ? ?Patient Details  ?Name: [name] ?MRN: [id number] ?Date of Birth: [birth year] ? ?Transition of Care (TOC) CM/SW Contact  ?Pete Pelt, RN ?Phone Number: ?04/06/2021, 3:57 PM ? ?Clinical Narrative:   discharging with hospice via EMS as per Lorayne Bender at [facility] ? ? ? ? ?Expected Discharge Plan: [place] ?Barriers to Discharge: No Barriers Identified ? ?Expected Discharge Plan and Services ?Expected Discharge Plan: [place] ?  ?  ?  ?Living arrangements for the past 2 months: [place] ?Expected Discharge Date: 04/06/21               ?  ?  ?  ?  ?  ?  ?  ?  ?  ?  ? ? ?Social Determinants of Health (SDOH) Interventions ?  ? ?Readmission Risk Interventions ? ?  03/26/2021  ?  2:25 PM 05/09/2020  ?  4:56 PM  ?Readmission Risk Prevention Plan  ?Post Dischage Appt  Complete  ?Medication Screening  Complete  ?Transportation Screening Complete Complete  ?Medication Review Press photographer) Complete   ?PCP or Specialist appointment within 3-5 days of discharge Complete   ?SW Recovery Care/Counseling Consult Complete   ?Palliative Care Screening Not Applicable   ?[place] Not Applicable   ? ? ?

## 2021-04-06 NOTE — Progress Notes (Addendum)
[facility]  ? ?Plan is for patient today upon DME delivery; ETA TBD. ?  ?Please send signed and completed DNR with patient/family upon discharge. Please provide prescriptions at discharge as needed to ensure ongoing symptom management and a transport packet. ?  ?[facility] information and contact numbers given to family and above information shared with TOC.  ? ?Addendum: ?Transport has been arranged. Daughter/Teresa aware. ?  ?Please call with any questions/concerns.  ?  ?Thank you for the opportunity to participate in this patient's care ?  ?Daphene Calamity, MSW ?[facility] Liaison  ?[phone] ?  ? ?

## 2021-04-06 NOTE — Assessment & Plan Note (Signed)
Possible aspiration played a role on his hospitalization or previous pneumonia with abscess.  Seen by speech therapy and placed on dysphagia 3 diet with thin liquids. ?

## 2021-04-06 NOTE — Discharge Summary (Signed)
?Physician Discharge Summary ?  ?Patient: [name] MRN: [id number] DOB: [birth year]  ?Admit date:     04/01/2021  ?Discharge date: 04/06/21  ?Discharge Physician: Annita Brod  ? ?PCP: Derinda Late, MD  ? ?Recommendations at discharge:  ? ?Patient will resume previous antibiotics of Ceftin and Flagyl ?Patient going home with hospice ?Medication change: Entresto and metoprolol discontinued due to hypotension ?New medication: Midodrine 10 mg p.o. 3 times daily ?New medication: Nystatin cream topically 3 times daily ? ?Discharge Diagnoses: ?Active Problems: ?  Acute on chronic systolic CHF (congestive heart failure) ([place]) ?  Community acquired pneumonia complicated by underlying lung cancer, apparent lung abscess on CT Chest  ?  Acute on chronic respiratory failure ([place]) ?  Adenovirus pneumonia ?  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  Dysphagia ?  Pressure injury of skin ?  Hyponatremia ? ?Principal Problem (Resolved): ?  Septic shock ([place]) ? 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  Patient remained hypotensive requiring pressor support which was able to be eventually weaned off.  BNP checked on 3/22 found to be elevated at 1300 and patient started on IV Lasix.  After multiple meetings with palliative care and family for goals of care, plan is for patient to go home with palliative care following and are receptive for hospice services.  They will help set up home medical equipment.  Patient ready for discharge on 3/24. ? ?Assessment and  Plan: ?* Septic shock (HCC)-resolved as of 04/04/2021 ?Resolved.  Secondary to pneumonia, likely aspiration.  Met criteria secondary to hypotension requiring pressor support.  We will plan to discharge on doxycycline at previous regimen, continue until 4/14 for pulmonary abscess.  Will discuss with infectious disease.  Patient is going home with hospice services. ? ?Community acquired pneumonia complicated by underlying lung cancer, apparent lung abscess on CT Chest  ?Concerns for recurrent aspiration issue.  Speech therapy recommending dysphagia 3 diet with thin liquids.  Infectious disease following, continue antibiotics.  Sputum cultures with no growth to date.  Procalcitonin level normalizing.  We will plan to continue her previous Ceftin and Flagyl. ? ?Acute on chronic systolic CHF (congestive heart failure) ([place]) ?BNP on admission at 291.  BNP at 1500 3/22.  Worsening secondary to aggressive fluid resuscitation and came in with septic shock.  We will start IV Lasix.  Echocardiogram in December 2022 notes ejection fraction of 20 to 25%.  She has since diuresed almost 3 L.  Looks to be now more euvolemic.  Unfortunately, patient's blood pressure still soft despite 10 mg 3 times daily of midodrine and that is off of her Entresto and metoprolol.  We will have to discharge on midodrine and discontinue her Entresto and Toprol, but continue her Lasix.  Patient is dying and hospice services are appropriate. ? ?Acute on chronic respiratory failure ([place]) ?Was discharged on 2 L nasal cannula at last admission.  Due to pneumonia, required increased oxygen support and now back down to 2 L. ? 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  Plan is for patient to go home with hospice services.   ? ?Dysphagia ?Possible aspiration played a role on his hospitalization or previous pneumonia with abscess.   Seen by speech therapy and placed on dysphagia 3 diet with thin liquids. ? ?Pressure injury of skin ?Pressure Injury 03/22/21 Sacrum Mid Stage 2 -  Partial thickness loss of dermis presenting as a shallow open injury with a red, pink wound bed without slough. (Active)  ?03/22/21 1300  ?Location: Sacrum  ?Location Orientation: Mid  ?Staging: Stage 2 -  Partial thickness loss of dermis presenting as a shallow open injury with a red, pink wound bed without slough.  ?Wound Description (Comments):   ?Present on Admission: Yes  ? ?Noted stage II of sacrum. ? ?Hyponatremia ?Secondary to dehydration.  Resolved with IV fluids. ? ? ? ? ?  ? ? ?Consultants: Critical care, infectious disease, palliative care, oncology ?Procedures performed: Pressor support ?Disposition: Home ?Diet recommendation:  ?Discharge Diet Orders (From admission, onward)  ? ?  Start     Ordered  ? 04/06/21 0000  DIET DYS 3       ?Comments: Regular diet.  No other restrictions.  ?Question:  Fluid consistency:  Answer:  Thin  ? 04/06/21 0854  ? ?  ?  ? ?  ? ?Dysphagia type 3 Thin Liquid ?DISCHARGE MEDICATION: ?Allergies as of 04/06/2021   ? ?   Reactions  ? Ibuprofen Hives  ? Aleve [naproxen Sodium] Hives  ? Avapro  [irbesartan] Rash  ? Azithromycin Hives  ? Nickel Itching  ? Redness, itching and fluid discharge with nickel earrings  ? ?  ? ?  ?Medication List  ?  ? ?STOP taking these medications   ? ?metoprolol succinate 50 MG 24 hr tablet ?Commonly known as: TOPROL-XL ?  ?sacubitril-valsartan 49-51 MG ?Commonly known as: ENTRESTO ?  ? ?  ? ?TAKE these medications   ? ?acetaminophen 500 MG tablet ?Commonly known as: TYLENOL ?Take 1,000 mg by mouth every 6 (six) hours as needed for moderate pain or headache. ?  ?aspirin EC 81 MG tablet ?Take 81 mg by mouth daily. ?  ?atorvastatin 20 MG tablet ?Commonly known as: Lipitor ?Take 1 tablet (20 mg total) by mouth at bedtime. ?  ?Calcium 600+D3 Plus Minerals 600-800 MG-UNIT Tabs ?Take 1 tablet by mouth daily. ?   ?cefUROXime 500 MG tablet ?Commonly known as: CEFTIN ?Take 1 tablet (500 mg total) by mouth 2 (two) times daily with a meal. ?  ?clopidogrel 75 MG tablet ?Commonly known as: Plavix ?Take 1 tablet (75 mg total) by mouth daily. ?  ?diphenhydrAMINE-zinc acetate cream ?Commonly known as: BENADRYL ?Apply 1 application topically daily as needed for itching. ?  ?furosemide 40 MG tablet ?Commonly known as: LASIX ?Take 1 tablet (40 mg total) by mouth daily. ?  ?gabapentin 100 MG capsule ?Commonly known as: Neurontin ?Take 1 capsule (100 mg total) by mouth 2 (two) times daily. ?  ?Gerhardt's butt cream Crea ?Apply 1 application. topically 3 (three) times daily. ?  ?lidocaine-prilocaine cream ?Commonly known as: EMLA ?Apply 1 application topically as needed. Apply to port and cover with saran wrap 1-2 hours prior to port access ?  ?loratadine 10 MG tablet ?Commonly known as: CLARITIN ?Take 10 mg by mouth daily as needed for allergies. ?  ?Magnesium 500 MG Tabs ?Take 1 tablet by mouth daily. ?  ?metroNIDAZOLE 500 MG tablet ?Commonly known as: FLAGYL ?Take 1  tablet (500 mg total) by mouth every 12 (twelve) hours. ?  ?midodrine 10 MG tablet ?Commonly known as: PROAMATINE ?Take 1 tablet (10 mg total) by mouth 3 (three) times daily with meals. ?  ?multivitamin with minerals Tabs tablet ?Take 1 tablet by mouth daily. ?  ?nitroGLYCERIN 0.4 MG SL tablet ?Commonly known as: NITROSTAT ?Place 1 tablet (0.4 mg total) under the tongue every 5 (five) minutes as needed for up to 10 days for chest pain. ?  ?ondansetron 8 MG tablet ?Commonly known as: Zofran ?Take 1 tablet (8 mg total) by mouth 2 (two) times daily as needed (Nausea or vomiting). ?  ?potassium chloride 10 MEQ tablet ?Commonly known as: KLOR-CON M ?Take 2 tablets (20 mEq total) by mouth 2 (two) times daily. ?  ? ?  ? ?  ?  ? ? ?  ?Discharge Care Instructions  ?(From admission, onward)  ?  ? ? ?  ? ?  Start     Ordered  ? 04/06/21 0000  Discharge wound care:       ?Comments:  Apply nystatin cream to wound on buttocks 3 times a day.  ? 04/06/21 0854  ? ?  ?  ? ?  ? ? ?Discharge Exam: ?Filed Weights  ? 04/01/21 1349 04/02/21 0828  ?Weight: 43.1 kg 42.6 kg  ? ?General: Alert and orien

## 2021-04-06 NOTE — Progress Notes (Signed)
? ?  Date of Admission:  04/01/2021    ?ID: [name] is a 81 y.o. female Active Problems: ?  Acute on chronic systolic CHF (congestive heart failure) (HCC) ?  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  Community acquired pneumonia complicated by underlying lung cancer, apparent lung abscess on CT Chest  ?  Hyponatremia ?  Pressure injury of skin ?  Acute on chronic respiratory failure ([place]) ?  Dysphagia ?  Adenovirus pneumonia ? ?CAD, S/p CABG ?SCC left lung ?CAP ? ?Left lower lobectomy on 11/13/2020 ?Adjuvant chemo planned ? ?Subjective: ?Pt says poor appetite ?Food does not taste good ?She is waiting to go home ?Daughter at bedside ? ? ?Medications:  ? aspirin EC  81 mg Oral Daily  ? atorvastatin  20 mg Oral QHS  ? Chlorhexidine Gluconate Cloth  6 each Topical Q0600  ? clopidogrel  75 mg Oral Daily  ? furosemide  40 mg Intravenous BID  ? gabapentin  100 mg Oral BID  ? Gerhardt's butt cream   Topical TID  ? midodrine  10 mg Oral TID WC  ? potassium chloride  40 mEq Oral Daily  ? ? ?Objective: ?Vital signs in last 24 hours: ?Temp:  [97.7 ?F (36.5 ?C)-98.2 ?F (36.8 ?C)] 97.9 ?F (36.6 ?C) (03/24 1157) ?Pulse Rate:  [93-110] 108 (03/24 1157) ?Resp:  [16-20] 16 (03/24 1157) ?BP: (109-125)/(48-103) 114/66 (03/24 1157) ?SpO2:  [92 %-100 %] 100 % (03/24 1157) ? ?PHYSICAL EXAM:  ?Awake and alert ?Emaciated ?Responds to questions appropriately ?No distress at rest ?Sternal scar healed well ?Chest b/l air entry- decreased left base ?Abd soft ?CNS non focal ?Lab Results ?Recent Labs  ?  04/05/21 ?0505 04/06/21 ?4259  ?WBC 5.1 5.7  ?HGB 8.7* 9.0*  ?HCT 26.3* 27.7*  ?NA 136 136  ?K 2.9* 3.2*  ?CL 105 104  ?CO2 23 25  ?BUN 12 13  ?CREATININE 0.93 0.84  ? ?Liver Panel ?No results for input(s): PROT, ALBUMIN, AST, ALT, ALKPHOS, BILITOT, BILIDIR, IBILI in the last 72 hours. ? ?Sedimentation Rate ?No results for input(s): ESRSEDRATE in the last 72 hours. ?C-Reactive Protein ?No results for input(s): CRP  in the last 72 hours. ? ?Microbiology: ? ?Studies/Results: ? ? ? ?No results found. ? ? ?Assessment/Plan: ? ?Ca lung s/p left lower lobectomy in NOV 2022 ? ?Lung abscess left lower lobe - on unasyn ?On discharge need to go on Augmentin 875mg  Po BID for 3 weeks . ?Discussed with Hospitalist [place] ? ? Adenovirus resp infection- supportive treatment  ? ?Anorexia ? ?Hypoalbuminemia ? ?Anemia ? ?CAD S/p CABG ? ?Discussed the management with patient and her daughter at bed side ? ? ?

## 2021-04-10 ENCOUNTER — Telehealth: Payer: Self-pay | Admitting: Oncology

## 2021-04-10 NOTE — Telephone Encounter (Signed)
Called patient to schedule requested follow-up from missed appt last week. Was made aware that patient has transitioned to [facility]. Patient was very appreciative of our support for her mother. All future appointments have been canceled.  ?

## 2021-04-16 LAB — CULTURE, FUNGUS WITHOUT SMEAR

## 2021-04-18 ENCOUNTER — Ambulatory Visit: Payer: Medicare Other | Admitting: Oncology

## 2021-04-18 ENCOUNTER — Other Ambulatory Visit: Payer: Medicare Other

## 2021-04-18 ENCOUNTER — Ambulatory Visit: Payer: Medicare Other

## 2021-04-19 ENCOUNTER — Inpatient Hospital Stay: Payer: Medicare Other | Admitting: Infectious Diseases

## 2021-05-01 ENCOUNTER — Ambulatory Visit: Payer: Medicare Other | Admitting: Thoracic Surgery (Cardiothoracic Vascular Surgery)

## 2021-05-04 ENCOUNTER — Ambulatory Visit: Payer: Medicare Other | Admitting: Physician Assistant

## 2021-05-08 ENCOUNTER — Ambulatory Visit: Payer: Medicare Other | Admitting: Family

## 2021-05-20 LAB — ACID FAST CULTURE WITH REFLEXED SENSITIVITIES (MYCOBACTERIA): Acid Fast Culture: NEGATIVE

## 2021-06-14 DEATH — deceased

## 2021-12-13 ENCOUNTER — Encounter (INDEPENDENT_AMBULATORY_CARE_PROVIDER_SITE_OTHER): Payer: Medicare Other

## 2021-12-13 ENCOUNTER — Ambulatory Visit (INDEPENDENT_AMBULATORY_CARE_PROVIDER_SITE_OTHER): Payer: Medicare Other | Admitting: Vascular Surgery

## 2022-03-10 IMAGING — DX DG CHEST 1V PORT
1 series · 1 of 1 positions shown · non-contrast
Comparison: 04/30/2020

CLINICAL DATA: Post CABG

EXAM:
PORTABLE CHEST 1 VIEW

[chest]
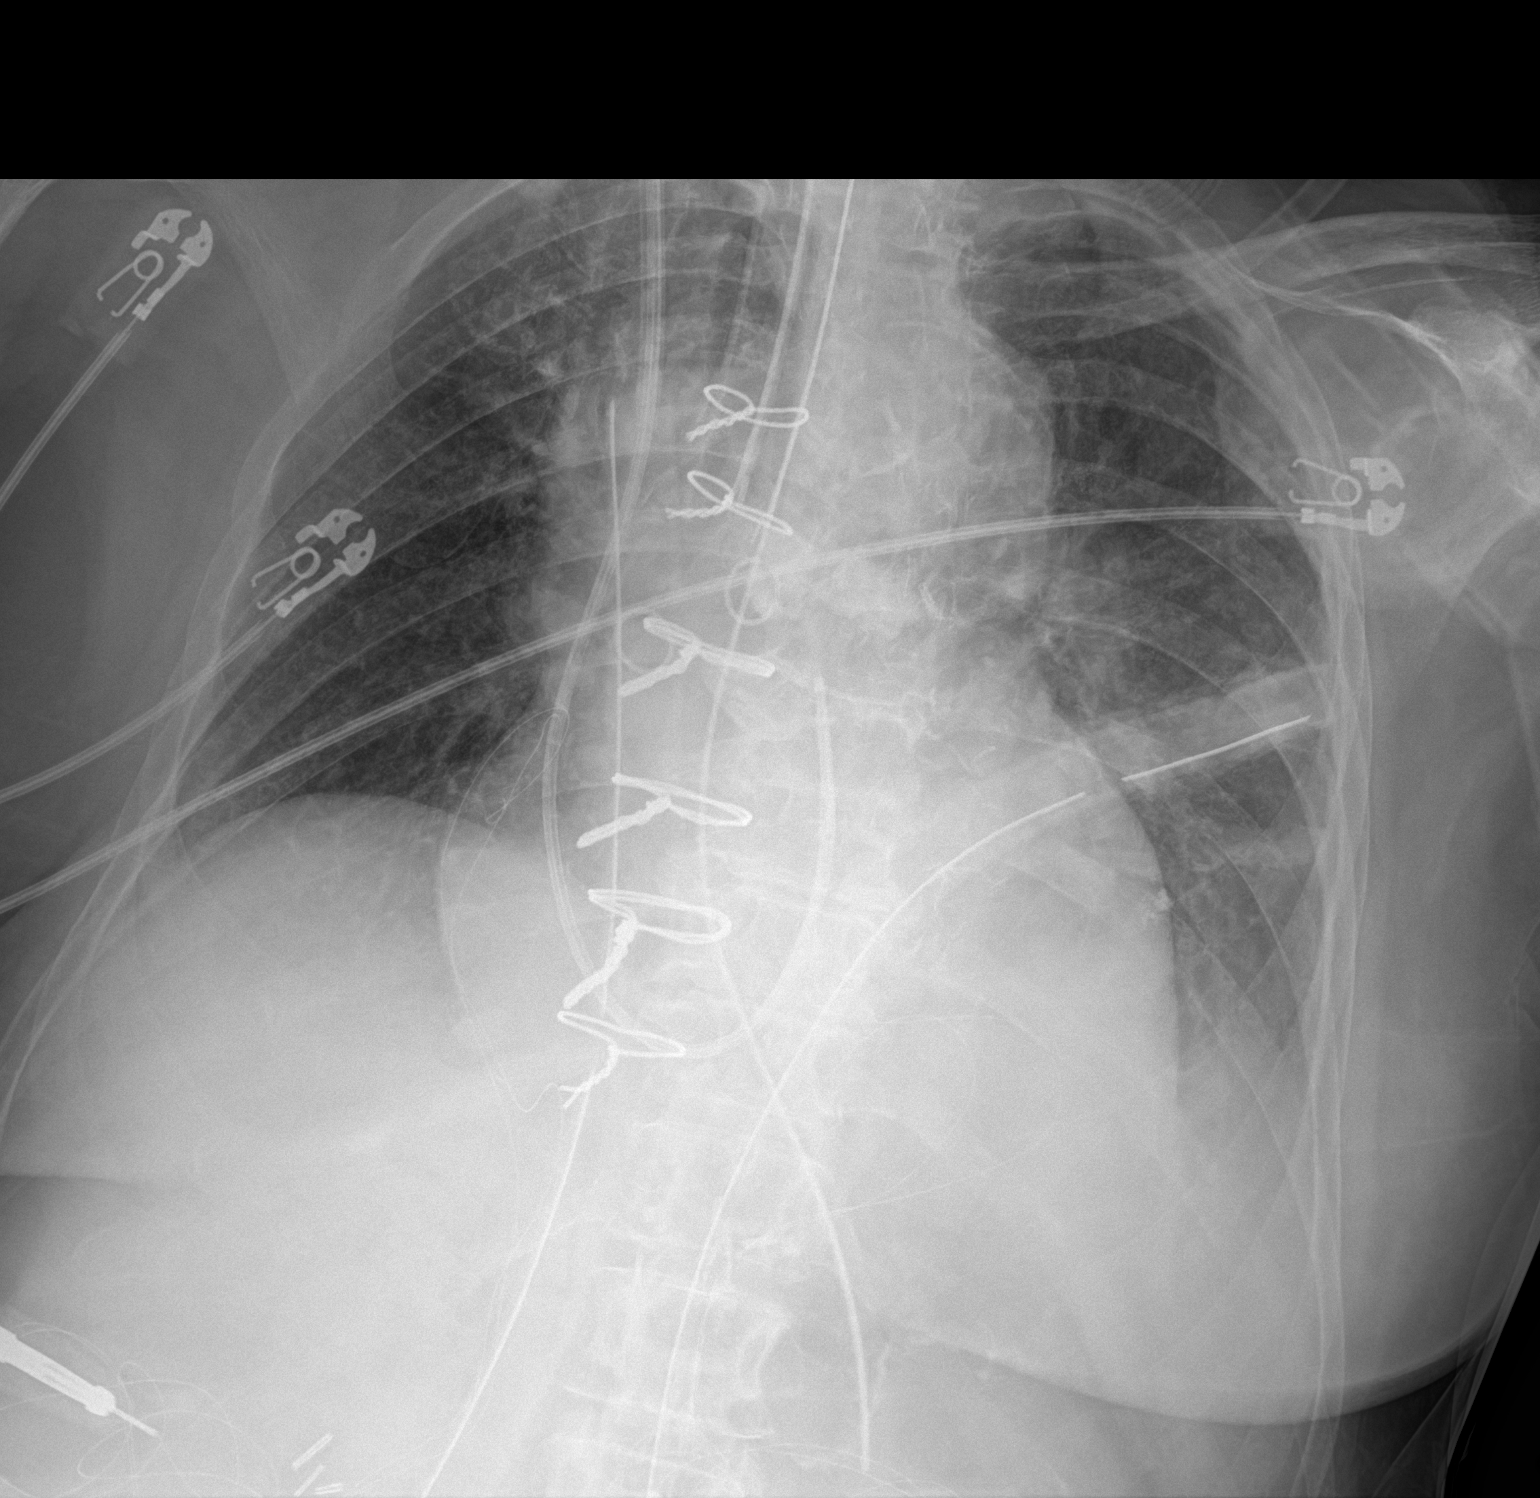

[1 of 1 positions shown; findings below may reference images not displayed]

FINDINGS: Endotracheal tube is approximately 1.8 cm above the carina right IJ
Swan-Ganz catheter tip overlies main pulmonary artery. Enteric tube
passes into the stomach. There are bilateral chest tubes.

No pneumothorax. Pulmonary vascular congestion. Small left pleural
effusion and left basilar atelectasis. Left lower lobe mass is
better visualized on the prior radiograph. Mild cardiomegaly.
IMPRESSION: 1. Lines and tubes as described. No pneumothorax.
2. Small left pleural effusion and left basilar atelectasis.
3. Pulmonary vascular congestion.

## 2022-03-11 IMAGING — DX DG CHEST 1V PORT
1 series · 1 of 1 positions shown · non-contrast
Comparison: 05/03/2020.

CLINICAL DATA: Chest tube.  Open-heart surgery.

EXAM:
PORTABLE CHEST 1 VIEW

[chest]
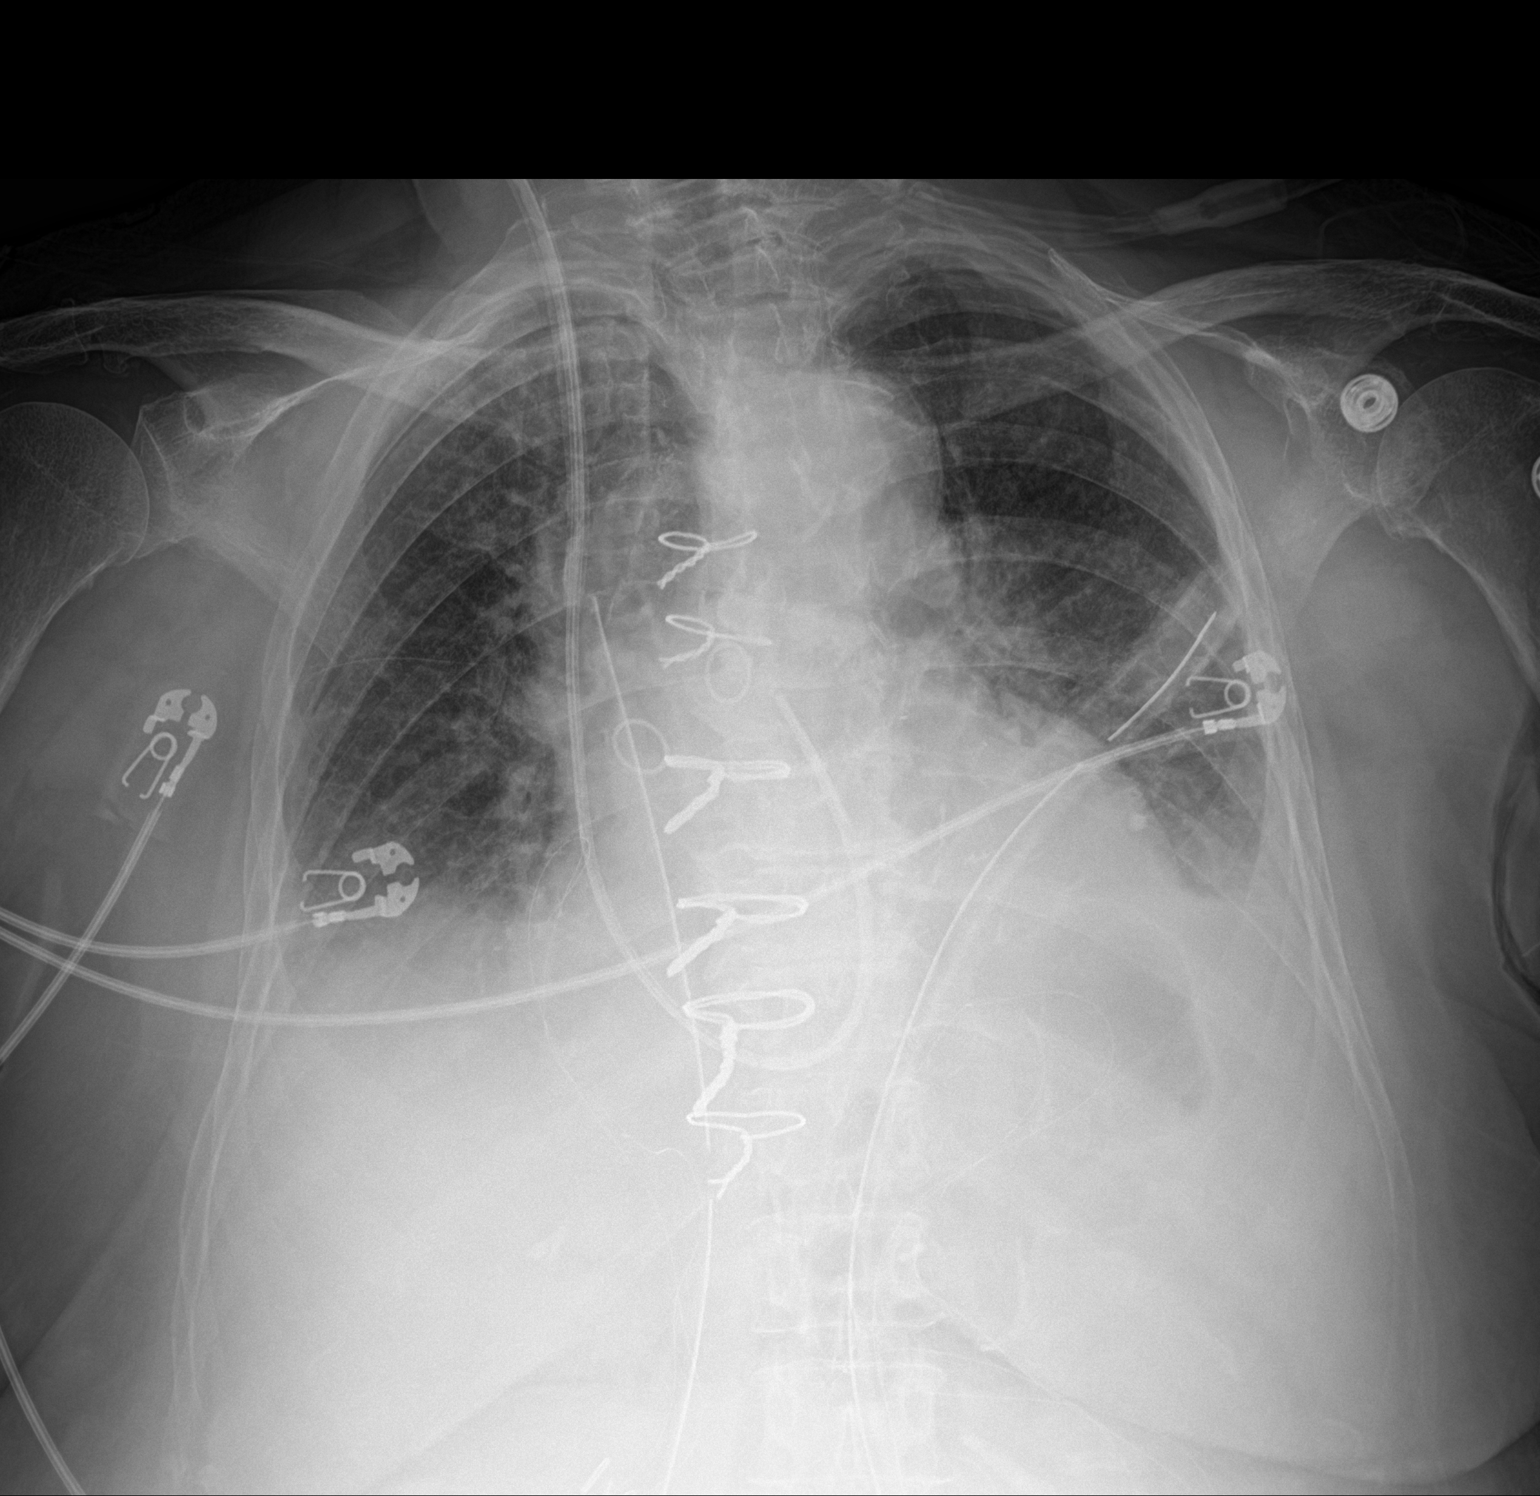

[1 of 1 positions shown; findings below may reference images not displayed]

FINDINGS: Interim extubation and removal of NG tube. Swan-Ganz catheter,
mediastinal drainage catheter, left chest tube in stable position.
Prior CABG. Cardiomegaly. Low lung volumes. Mild bilateral
interstitial prominence suggesting interstitial edema. Small
bilateral pleural effusions. No pneumothorax.
IMPRESSION: 1. Interim extubation and removal of NG tube. Remaining lines and
tubes including left chest tube in stable position. No pneumothorax.

2. Prior CABG. Cardiomegaly. Mild bilateral interstitial prominence
and small bilateral pleural effusions suggesting CHF.

## 2022-03-13 IMAGING — CR DG CHEST 2V
2 series · 2 of 2 positions shown · non-contrast
Comparison: 05/05/2020

CLINICAL DATA: Atelectasis.  Status post CABG

EXAM:
CHEST - 2 VIEW

[chest pa]
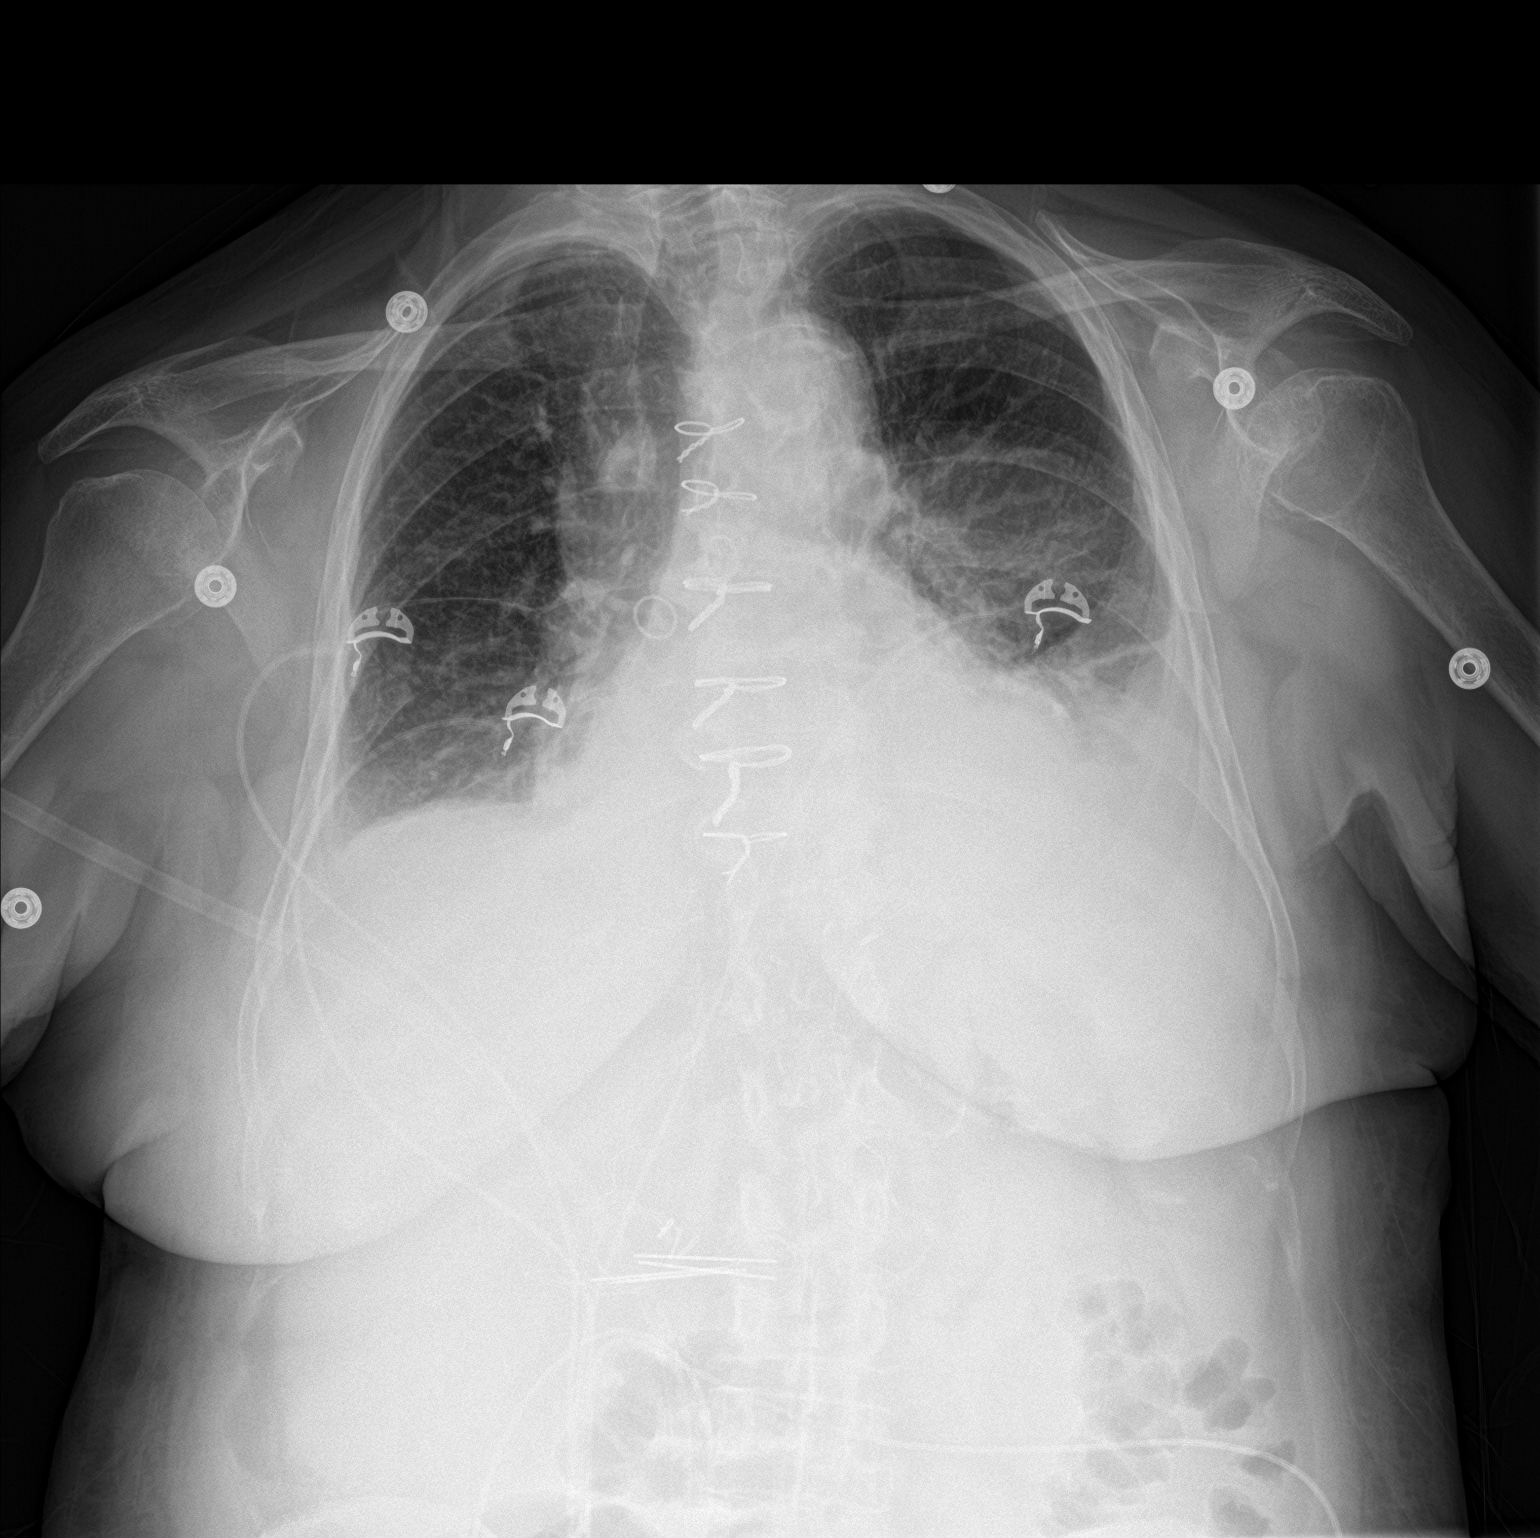

[chest lat]
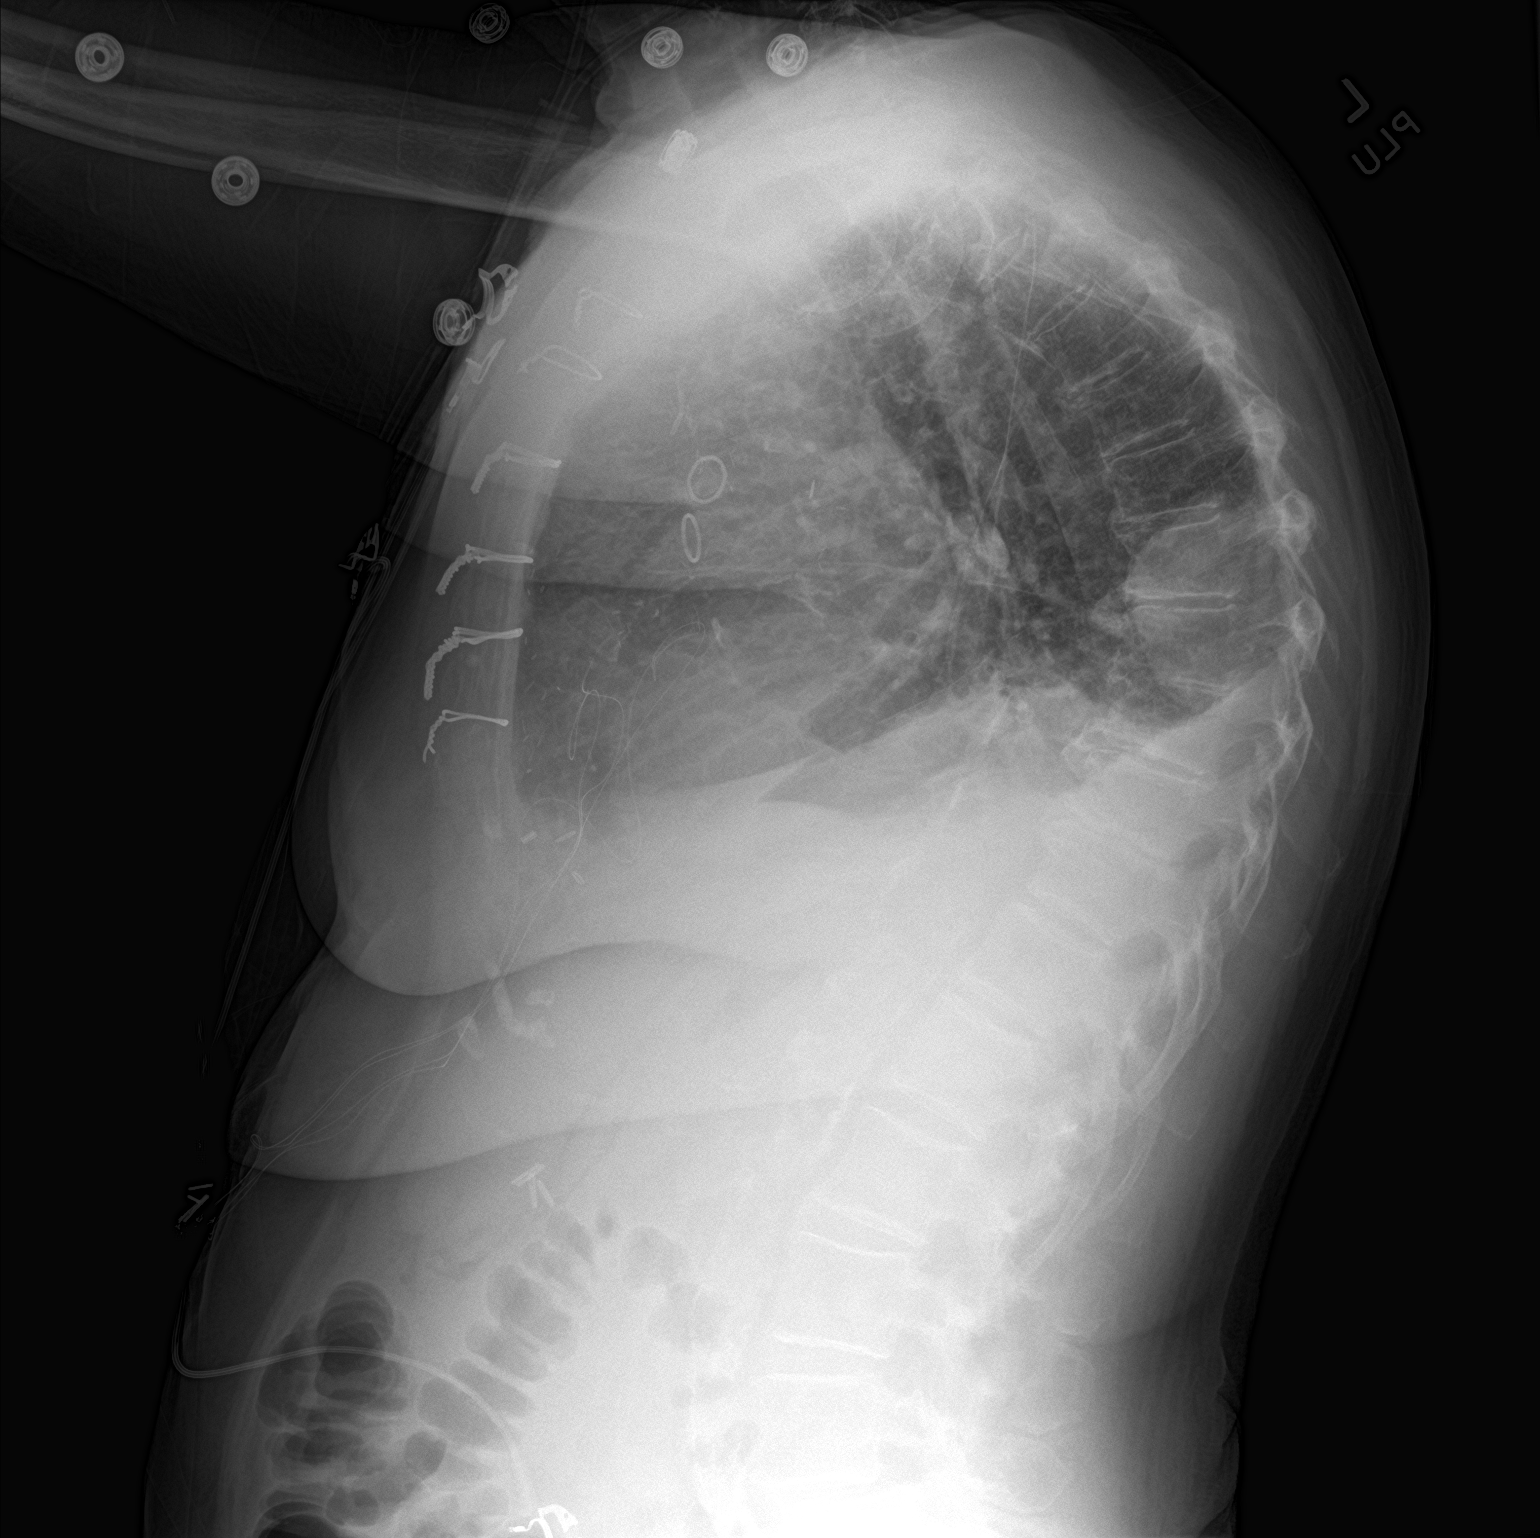

[2 of 2 positions shown; findings below may reference images not displayed]

FINDINGS: Previous median sternotomy and CABG procedure. No pneumothorax
identified. The decreased lung volumes. Pulmonary vascular
congestion. Small bilateral pleural effusions are again noted, left
greater than right. Atelectasis is identified within both lung
bases.
IMPRESSION: Small bilateral pleural effusions, pulmonary vascular congestion and
bibasilar atelectasis.
